# Patient Record
Sex: Male | Born: 1950 | Smoking: Former smoker
Health system: Southern US, Community
[De-identification: ages and names within clinical notes are randomized; demographics above are authoritative.]

## PROBLEM LIST (undated history)

## (undated) DIAGNOSIS — J309 Allergic rhinitis, unspecified: Secondary | ICD-10-CM

## (undated) DIAGNOSIS — R1310 Dysphagia, unspecified: Secondary | ICD-10-CM

## (undated) DIAGNOSIS — E785 Hyperlipidemia, unspecified: Secondary | ICD-10-CM

## (undated) DIAGNOSIS — K219 Gastro-esophageal reflux disease without esophagitis: Secondary | ICD-10-CM

## (undated) DIAGNOSIS — F209 Schizophrenia, unspecified: Secondary | ICD-10-CM

## (undated) DIAGNOSIS — M4850XA Collapsed vertebra, not elsewhere classified, site unspecified, initial encounter for fracture: Secondary | ICD-10-CM

## (undated) DIAGNOSIS — F329 Major depressive disorder, single episode, unspecified: Secondary | ICD-10-CM

## (undated) DIAGNOSIS — F0390 Unspecified dementia without behavioral disturbance: Secondary | ICD-10-CM

## (undated) HISTORY — DX: Collapsed vertebra, not elsewhere classified, site unspecified, initial encounter for fracture: M48.50XA

## (undated) HISTORY — DX: Allergic rhinitis, unspecified: J30.9

## (undated) HISTORY — DX: Dysphagia, unspecified: R13.10

## (undated) HISTORY — DX: Major depressive disorder, single episode, unspecified: F32.9

## (undated) HISTORY — DX: Hyperlipidemia, unspecified: E78.5

## (undated) HISTORY — DX: Schizophrenia, unspecified: F20.9

## (undated) HISTORY — PX: EXPLORATORY LAPAROTOMY: SUR591

## (undated) HISTORY — DX: Gastro-esophageal reflux disease without esophagitis: K21.9

---

## 2012-08-02 ENCOUNTER — Encounter: Payer: Self-pay | Admitting: Adult Health

## 2012-08-02 ENCOUNTER — Non-Acute Institutional Stay (SKILLED_NURSING_FACILITY): Payer: Medicare Other | Admitting: Adult Health

## 2012-08-02 DIAGNOSIS — M4850XA Collapsed vertebra, not elsewhere classified, site unspecified, initial encounter for fracture: Secondary | ICD-10-CM | POA: Insufficient documentation

## 2012-08-02 DIAGNOSIS — J309 Allergic rhinitis, unspecified: Secondary | ICD-10-CM

## 2012-08-02 DIAGNOSIS — F329 Major depressive disorder, single episode, unspecified: Secondary | ICD-10-CM | POA: Insufficient documentation

## 2012-08-02 DIAGNOSIS — F209 Schizophrenia, unspecified: Secondary | ICD-10-CM

## 2012-08-02 DIAGNOSIS — K219 Gastro-esophageal reflux disease without esophagitis: Secondary | ICD-10-CM

## 2012-08-02 DIAGNOSIS — E785 Hyperlipidemia, unspecified: Secondary | ICD-10-CM

## 2012-08-02 DIAGNOSIS — F32A Depression, unspecified: Secondary | ICD-10-CM | POA: Insufficient documentation

## 2012-08-02 DIAGNOSIS — R1310 Dysphagia, unspecified: Secondary | ICD-10-CM

## 2012-08-02 DIAGNOSIS — M8448XD Pathological fracture, other site, subsequent encounter for fracture with routine healing: Secondary | ICD-10-CM

## 2012-08-02 HISTORY — DX: Schizophrenia, unspecified: F20.9

## 2012-08-02 HISTORY — DX: Gastro-esophageal reflux disease without esophagitis: K21.9

## 2012-08-02 HISTORY — DX: Depression, unspecified: F32.A

## 2012-08-02 HISTORY — DX: Allergic rhinitis, unspecified: J30.9

## 2012-08-02 HISTORY — DX: Hyperlipidemia, unspecified: E78.5

## 2012-08-02 HISTORY — DX: Dysphagia, unspecified: R13.10

## 2012-08-02 HISTORY — DX: Collapsed vertebra, not elsewhere classified, site unspecified, initial encounter for fracture: M48.50XA

## 2012-08-03 NOTE — Assessment & Plan Note (Addendum)
He is stable is taking nascort twice daily

## 2012-08-03 NOTE — Progress Notes (Signed)
Patient ID: Kevin Humphrey, male   DOB: Oct 24, 1950, 62 y.o.   MRN: [id number]  Chief Complaint  Patient presents with  . Medical Managment of Chronic Issues    HPI:  Allergic rhinitis He is stable is taking nascort twice daily   GERD (gastroesophageal reflux disease) Is stable is taking prilosec 20 mg daily   Vertebral compression fracture He is presently stable is taking percocet 5/325 mg 1or 2 tabs every 6 hours as needed for pain  Depression Is emotionally stable is taking zoloft 200 mg daily   Other and unspecified hyperlipidemia He was taken off his medications due to weight loss which as stabilized since then he is presetnly stable.   Schizophrenia He is presently stable is taking risperdal 2 mg in the and and 4 mg in the pm is taking cogentin 2 mg twice daily and takes klonopin 0.5 mg three times daily    No past medical history on file.  No past surgical history on file.  VITAL SIGNS BP 112/76  Pulse 76  Ht 5\' 11"  (1.803 m)  Wt 163 lb (73.936 kg)  BMI 22.74 kg/m2   Patient's Medications  New Prescriptions   No medications on file  Previous Medications   BENZTROPINE (COGENTIN) 2 MG TABLET    Take 2 mg by mouth 2 (two) times daily.   CLONAZEPAM (KLONOPIN) 0.5 MG TABLET    Take 0.5 mg by mouth 3 (three) times daily.   MULTIPLE VITAMIN (MULTIVITAMIN) TABLET    Take 1 tablet by mouth daily.   OMEPRAZOLE (PRILOSEC) 20 MG CAPSULE    Take 20 mg by mouth daily.   OXYCODONE-ACETAMINOPHEN (PERCOCET/ROXICET) 5-325 MG PER TABLET    Take 1 tablet by mouth every 6 (six) hours as needed for pain.   RISPERIDONE (RISPERDAL) 2 MG TABLET    Take 2 mg by mouth 2 (two) times daily. Takes 2 mg in the am and 4 mg in the pm   SERTRALINE (ZOLOFT) 100 MG TABLET    Take 100 mg by mouth daily. Takes 200 mg daily   TRAZODONE (DESYREL) 150 MG TABLET    Take 150 mg by mouth at bedtime.   TRIAMCINOLONE (NASACORT) 55 MCG/ACT NASAL INHALER    Place 2 sprays into the nose 2 (two) times daily.   Modified Medications   No medications on file  Discontinued Medications   No medications on file    SIGNIFICANT DIAGNOSTIC EXAMS  04/01/2012: CT of neck: No acute cervical spinal fracture; positive for cervical spondylosis; large bridging osteophytes 04/01/2012: CT of chest: Positive for acute rib fracture;severe thoracic spondylosis; positive bilateral pleural effusion 04/01/2012: CT of head: No significant changes from exam of 01/10/2012. Left maxillary sinus without change.     LAB REVIEWED  04-15-12: wbc 7.5; hgb 11.3; hct 34.0; mcv 88.5;plt 427; glucose 74; bun 21; creat 0.93; k+ 4.4 ;na++ 140;  alk phos 44; albumin 3.7; chol 112; ldl 62; trig 83; ammonia 119; hgb a1c 5.4; depakote 36.5    Review of Systems  Unable to perform ROS  Physical Exam  Constitutional:  Is thin  Neck: Neck supple.  Cardiovascular: Normal rate, regular rhythm and intact distal pulses.   Respiratory: Effort normal and breath sounds normal.  GI: Soft. Bowel sounds are normal.  Musculoskeletal: Normal range of motion. He exhibits no edema.  Neurological: He is alert.  Is oriented to self   Skin: Skin is warm and dry.  Psychiatric: He has a normal mood and affect.  ASSESSMENT/ PLAN:   He is presently stable will continue his current regimen and will not make changes will make changes in the future as indicated.

## 2012-08-03 NOTE — Assessment & Plan Note (Signed)
He is presently stable is taking risperdal 2 mg in the and and 4 mg in the pm is taking cogentin 2 mg twice daily and takes klonopin 0.5 mg three times daily

## 2012-08-03 NOTE — Assessment & Plan Note (Signed)
Is emotionally stable is taking zoloft 200 mg daily

## 2012-08-03 NOTE — Assessment & Plan Note (Signed)
He was taken off his medications due to weight loss which as stabilized since then he is presetnly stable.

## 2012-08-03 NOTE — Assessment & Plan Note (Signed)
Is stable is taking prilosec 20 mg daily

## 2012-08-03 NOTE — Assessment & Plan Note (Signed)
He is presently stable is taking percocet 5/325 mg 1or 2 tabs every 6 hours as needed for pain 

## 2012-09-27 ENCOUNTER — Non-Acute Institutional Stay (SKILLED_NURSING_FACILITY): Payer: Medicare Other | Admitting: Adult Health

## 2012-09-27 DIAGNOSIS — F209 Schizophrenia, unspecified: Secondary | ICD-10-CM

## 2012-09-27 DIAGNOSIS — J309 Allergic rhinitis, unspecified: Secondary | ICD-10-CM

## 2012-09-27 DIAGNOSIS — F3289 Other specified depressive episodes: Secondary | ICD-10-CM

## 2012-09-27 DIAGNOSIS — R1310 Dysphagia, unspecified: Secondary | ICD-10-CM

## 2012-09-27 DIAGNOSIS — M8448XD Pathological fracture, other site, subsequent encounter for fracture with routine healing: Secondary | ICD-10-CM

## 2012-09-27 DIAGNOSIS — F329 Major depressive disorder, single episode, unspecified: Secondary | ICD-10-CM

## 2012-09-27 DIAGNOSIS — K219 Gastro-esophageal reflux disease without esophagitis: Secondary | ICD-10-CM

## 2012-12-05 ENCOUNTER — Other Ambulatory Visit: Payer: Self-pay | Admitting: Geriatric Medicine

## 2012-12-05 MED ORDER — CLONAZEPAM 0.5 MG PO TABS: 0.5000 mg | ORAL_TABLET | Freq: Three times a day (TID) | ORAL | Status: DC

## 2013-01-17 ENCOUNTER — Non-Acute Institutional Stay (SKILLED_NURSING_FACILITY): Payer: Medicare Other | Admitting: Adult Health

## 2013-01-17 DIAGNOSIS — F3289 Other specified depressive episodes: Secondary | ICD-10-CM

## 2013-01-17 DIAGNOSIS — F209 Schizophrenia, unspecified: Secondary | ICD-10-CM

## 2013-01-17 DIAGNOSIS — K219 Gastro-esophageal reflux disease without esophagitis: Secondary | ICD-10-CM

## 2013-01-17 DIAGNOSIS — M8448XD Pathological fracture, other site, subsequent encounter for fracture with routine healing: Secondary | ICD-10-CM

## 2013-01-17 DIAGNOSIS — R1310 Dysphagia, unspecified: Secondary | ICD-10-CM

## 2013-01-17 DIAGNOSIS — J309 Allergic rhinitis, unspecified: Secondary | ICD-10-CM

## 2013-01-17 DIAGNOSIS — F329 Major depressive disorder, single episode, unspecified: Secondary | ICD-10-CM

## 2013-01-28 ENCOUNTER — Non-Acute Institutional Stay (SKILLED_NURSING_FACILITY): Payer: Medicare Other | Admitting: Internal Medicine

## 2013-01-28 DIAGNOSIS — K219 Gastro-esophageal reflux disease without esophagitis: Secondary | ICD-10-CM

## 2013-01-28 DIAGNOSIS — F209 Schizophrenia, unspecified: Secondary | ICD-10-CM

## 2013-01-28 NOTE — Progress Notes (Signed)
Patient ID: Kevin Humphrey, male   DOB: 12-Jun-1950, 62 y.o.   MRN: [id number] Facility; [facility] Chief complaint review of general medical issues, functional status. History; this is a gentleman who is been in the facility since late 2013. He has a long history of long-standing schizophrenia and seizure disorder. At that point he was admitted to a hospital with encephalopathy of uncertain etiology anemia and thoracic compression fractures. He had been extensively worked up for liver disease due to the finding of an elevated ammonia level and nonspecifically was found. I been asked to look at him for consideration of transfer to an assisted living level of care apparently at the request of [facility] workers who for review him periodically.   Past medical history #1 recurrent falls #2 history of pleural effusions #3 right rib fractures #4 compression fracture of T10 #5 hyponatremia #6 abnormal ammonia level without a history of liver disease and normal liver function tests #7 history of gastroesophageal reflux disease #8 history of depression and also schizophrenia #9 hyperlipidemia 10 allergic rhinitis 11)iron def anemia    Medication list is reviewed; he is on a Metrazol 20 mg daily, Risperdal 2 mg daily and 4 mg at at bedtime, Zoloft 200 mg in the morning, Nasacort 2 sprays in each nostril twice a day, Cogentin 1 mg twice daily, Klonopin 1 mg 3 times daily, trazodone 150 mg at night and    Social; the patient [place] this facility [place] I'm [place] exactly sure which one. He has never returned to his previous living location which I gather was in assisted living. In reviewing his functional status with the staff he is frequently doubly incontinent. He is total assistance with bathing dressing and hygiene. He is a one-person assist with transfers from bed to wheelchair but is mostly in the wheelchair during the day. He can propel himself in a wheelchair and can  feed himself with setup help  Physical examination Respiratory clear entry bilaterally Cardiac heart sounds are normal no murmurs no signs of congestive heart failure AB; morbid obesity but no masses or tenderness. GU no convincing bladder distention  Impression/plan #1 nonambulatory state; I am not completely certain of the exact etiology however the patient is not capable of doing much more than a pivot transfer. #2 severe psychiatric issues which seem mostly stable at the moment likely as schizophrenia or schizoaffective disorder #3 history of gastroesophageal reflux disease. He probably also has and hiatal hernia as I hear gurgling up into his chest   The exact history here he may be lost to antiquity. I will ask for my initial notes on this man to be returned to the chart as I no longer have access to my prior computer system. Nevertheless it is not easy for me to look at this man is in assisted living candidate. He requires maximal ADL assistance, mostly double incontinence. That is not to say that there isn't assisted living as they can manage this, however I think he is appropriately placed at this moment and it is difficult for me to understand how anybody could think otherwise

## 2013-02-11 ENCOUNTER — Encounter: Payer: Self-pay | Admitting: Adult Health

## 2013-02-11 NOTE — Progress Notes (Signed)
Patient ID: Kevin Humphrey, male   DOB: Feb 25, 1951, 62 y.o.   MRN: [id number]  [place]  No Known Allergies   Chief Complaint  Patient presents with  . Medical Managment of Chronic Issues    HPI: He is being seen for the medical management of his chronic illnesses. There are no concerns being voiced by the nursing staff at this time. He is unable to fully participate in the hpi or ros. Overall his status remains without significant change.    Past Medical History  Diagnosis Date  . GERD (gastroesophageal reflux disease) 08/02/2012  . Dysphagia, unspecified 08/02/2012  . Allergic rhinitis 08/02/2012  . Vertebral compression fracture 08/02/2012  . Other and unspecified hyperlipidemia 08/02/2012  . Schizophrenia 08/02/2012  . Depression 08/02/2012    No past surgical history on file.  VITAL SIGNS BP 130/84  Pulse 64  Ht 5\' 11"  (1.803 m)  Wt 184 lb (83.462 kg)  BMI 25.67 kg/m2   Patient's Medications  New Prescriptions   No medications on file  Previous Medications   BENZTROPINE (COGENTIN) 2 MG TABLET    Take 2 mg by mouth 2 (two) times daily.   CLONAZEPAM (KLONOPIN) 0.5 MG TABLET    Take 1 tablet (0.5 mg total) by mouth 3 (three) times daily.   CLONAZEPAM (KLONOPIN) 0.5 MG TABLET    Take 1 tablet (0.5 mg total) by mouth 3 (three) times daily.   MULTIPLE VITAMIN (MULTIVITAMIN) TABLET    Take 1 tablet by mouth daily.   OMEPRAZOLE (PRILOSEC) 20 MG CAPSULE    Take 20 mg by mouth daily.   OXYCODONE-ACETAMINOPHEN (PERCOCET/ROXICET) 5-325 MG PER TABLET    Take 1 tablet by mouth every 6 (six) hours as needed for pain.   RISPERIDONE (RISPERDAL) 2 MG TABLET    Take 2 mg by mouth 2 (two) times daily. Takes 2 mg in the am and 4 mg in the pm   SERTRALINE (ZOLOFT) 100 MG TABLET    Take 100 mg by mouth daily. Takes 200 mg daily   TRAZODONE (DESYREL) 150 MG TABLET    Take 150 mg by mouth at bedtime.   TRIAMCINOLONE (NASACORT) 55 MCG/ACT NASAL INHALER    Place 2 sprays into the nose 2 (two)  times daily.  Modified Medications   No medications on file  Discontinued Medications   No medications on file    SIGNIFICANT DIAGNOSTIC EXAMS    04/01/2012: CT of neck: No acute cervical spinal fracture; positive for cervical spondylosis; large bridging osteophytes 04/01/2012: CT of chest: Positive for acute rib fracture;severe thoracic spondylosis; positive bilateral pleural effusion 04/01/2012: CT of head: No significant changes from exam of 01/10/2012. Left maxillary sinus without change.     LAB REVIEWED  04-15-12: wbc 7.5; hgb 11.3; hct 34.0; mcv 88.5;plt 427; glucose 74; bun 21; creat 0.93; k+ 4.4 ;na++ 140; alk phos 44; albumin 3.7; chol 112; ldl 62; trig 83; ammonia 119; hgb a1c 5.4; depakote 36.5  08-05-12: ammonia 92 08-30-12: ammonia 47   Review of Systems  Unable to perform ROS  Physical Exam  Constitutional:  Is thin  Neck: Neck supple.  Cardiovascular: Normal rate, regular rhythm and intact distal pulses.   Respiratory: Effort normal and breath sounds normal.  GI: Soft. Bowel sounds are normal.  Musculoskeletal: Normal range of motion. He exhibits no edema.  Neurological: He is alert.  Is oriented to self   Skin: Skin is warm and dry.  Psychiatric: He has a normal mood and affect.  ASSESSMENT/ PLAN:   1. Dysphagia: no signs of aspiration present; will continue his current plan of care and will continue to monitor his status  2. Genella Rife; will continue prilosec 20 mg daily  3. Allergic rhinitis: will continue nasocort nasal spray daily and monitor   4. Vertebral compression fracture: is stable will continue his percocet 5/325 mg every 4 hours as needed for pain and will monitor his status  5. Depression: he is emotionally stable will continue his zoloft 200 mg daily and klonopin 0.5 mg three times daily for anxiety and trazodone 150 mg nightly for sleep and depression and will continue to monitor his emotional status.   6. Schizophrenia: is without change  in status; there are no reports of significant behavorial issues present; will continue his risperdal 2 mg int he am and 4 mg in the pm and will continue cogentin 2 mg twice daily for eps symptoms and will monitor his status.    Will check cbc; cmp; hgb a1c next draw.

## 2013-02-17 ENCOUNTER — Encounter: Payer: Self-pay | Admitting: Adult Health

## 2013-02-17 NOTE — Progress Notes (Signed)
Patient ID: Kevin Humphrey, male   DOB: 12-31-1950, 62 y.o.   MRN: [id number]  [place]  No Known Allergies   Chief Complaint  Patient presents with  . Medical Managment of Chronic Issues    HPI:  He is being seen for the management of his chronic illnesses. There are no concerns being voiced by the nursing staff at this time; overall his status remains unchanged. He is unable to participate in the hpi or ros.   Past Medical History  Diagnosis Date  . GERD (gastroesophageal reflux disease) 08/02/2012  . Dysphagia, unspecified 08/02/2012  . Allergic rhinitis 08/02/2012  . Vertebral compression fracture 08/02/2012  . Other and unspecified hyperlipidemia 08/02/2012  . Schizophrenia 08/02/2012  . Depression 08/02/2012    No past surgical history on file.  VITAL SIGNS BP 120/70  Pulse 78  Ht 5\' 11"  (1.803 m)  Wt 206 lb (93.441 kg)  BMI 28.74 kg/m2   Patient's Medications  New Prescriptions   No medications on file  Previous Medications   BENZTROPINE (COGENTIN) 2 MG TABLET    Take 1 mg by mouth 2 (two) times daily.    CLONAZEPAM (KLONOPIN) 0.5 MG TABLET    Take 1 tablet (0.5 mg total) by mouth 3 (three) times daily.   MULTIPLE VITAMIN (MULTIVITAMIN) TABLET    Take 1 tablet by mouth daily.   OMEPRAZOLE (PRILOSEC) 20 MG CAPSULE    Take 20 mg by mouth daily.   OXYCODONE-ACETAMINOPHEN (PERCOCET/ROXICET) 5-325 MG PER TABLET    Take 1 tablet by mouth every 6 (six) hours as needed for pain.   RISPERIDONE (RISPERDAL) 2 MG TABLET    Take 4 mg by mouth 2 (two) times daily. Takes 2 mg in the am and 4 mg in the pm   SERTRALINE (ZOLOFT) 100 MG TABLET    Take 100 mg by mouth daily. Takes 200 mg daily   TRAZODONE (DESYREL) 150 MG TABLET    Take 150 mg by mouth at bedtime.   TRIAMCINOLONE (NASACORT) 55 MCG/ACT NASAL INHALER    Place 2 sprays into the nose 2 (two) times daily.  Modified Medications   No medications on file  Discontinued Medications   CLONAZEPAM (KLONOPIN) 0.5 MG TABLET    Take  1 tablet (0.5 mg total) by mouth 3 (three) times daily.    SIGNIFICANT DIAGNOSTIC EXAMS  04/01/2012: CT of neck: No acute cervical spinal fracture; positive for cervical spondylosis; large bridging osteophytes 04/01/2012: CT of chest: Positive for acute rib fracture;severe thoracic spondylosis; positive bilateral pleural effusion 04/01/2012: CT of head: No significant changes from exam of 01/10/2012. Left maxillary sinus without change.     LAB REVIEWED  04-15-12: wbc 7.5; hgb 11.3; hct 34.0; mcv 88.5;plt 427; glucose 74; bun 21; creat 0.93; k+ 4.4 ;na++ 140; alk phos 44; albumin 3.7; chol 112; ldl 62; trig 83; ammonia 119; hgb a1c 5.4; depakote 36.5  08-05-12: ammonia 92 08-30-12: ammonia 47 12-19-12: wbc 6.5; hgb 11.9; hct 34.7; mcv 83.6; plt 272; glucose 83; bun 15; creat 0.84; k+6.3; na++136; liver normal albumin 3.7 12-20-12: glucose 79; bun 18; creat 0.93; k+4.7; na++ 138 01-03-13; urine culture: enterococcus: levaquin    Review of Systems  Unable to perform ROS  Physical Exam  Constitutional:  Is thin  Neck: Neck supple.  Cardiovascular: Normal rate, regular rhythm and intact distal pulses.   Respiratory: Effort normal and breath sounds normal.  GI: Soft. Bowel sounds are normal.  Musculoskeletal: Normal range of motion. He exhibits no edema.  he is no longer wearing a back brace  Neurological: He is alert.  Is oriented to self   Skin: Skin is warm and dry.  Psychiatric: He has a normal mood and affect.    ASSESSMENT/ PLAN:   1. Dysphagia: no signs of aspiration present; will continue his current plan of care and will continue to monitor his status  2. Genella Rife; will continue prilosec 20 mg daily  3. Allergic rhinitis: will continue nasocort nasal spray daily and monitor   4. Vertebral compression fracture: is stable will continue his percocet 5/325 mg every 6 hours as needed for pain and will monitor his status  5. Depression: he is emotionally stable will continue his  zoloft 200 mg daily and klonopin 0.5 mg three times daily for anxiety and trazodone 150 mg nightly for sleep and depression and will continue to monitor his emotional status.   6. Schizophrenia: is without change in status; there are no reports of significant behavorial issues present; will continue his risperdal 4 mg twice daily  and will continue cogentin 1 mg twice daily for eps symptoms and will monitor his status.    Will check hgb a1c next draw

## 2013-02-28 ENCOUNTER — Non-Acute Institutional Stay (SKILLED_NURSING_FACILITY): Payer: Medicare Other | Admitting: Adult Health

## 2013-02-28 DIAGNOSIS — F329 Major depressive disorder, single episode, unspecified: Secondary | ICD-10-CM

## 2013-02-28 DIAGNOSIS — J309 Allergic rhinitis, unspecified: Secondary | ICD-10-CM

## 2013-02-28 DIAGNOSIS — F209 Schizophrenia, unspecified: Secondary | ICD-10-CM

## 2013-02-28 DIAGNOSIS — R1310 Dysphagia, unspecified: Secondary | ICD-10-CM

## 2013-02-28 DIAGNOSIS — K219 Gastro-esophageal reflux disease without esophagitis: Secondary | ICD-10-CM

## 2013-02-28 DIAGNOSIS — M8448XD Pathological fracture, other site, subsequent encounter for fracture with routine healing: Secondary | ICD-10-CM

## 2013-02-28 DIAGNOSIS — E785 Hyperlipidemia, unspecified: Secondary | ICD-10-CM

## 2013-03-03 ENCOUNTER — Encounter: Payer: Self-pay | Admitting: Adult Health

## 2013-03-03 MED ORDER — ATORVASTATIN CALCIUM 20 MG PO TABS: 20.0000 mg | ORAL_TABLET | Freq: Every day | ORAL | Status: DC

## 2013-03-03 NOTE — Progress Notes (Signed)
Patient ID: Kevin Humphrey, male   DOB: 1950-08-03, 62 y.o.   MRN: [id number]  [place]  No Known Allergies   Chief Complaint  Patient presents with  . Medical Managment of Chronic Issues    HPI: He is being seen for the management of his chronic illnesses. Overall his status remains without change over there recent past. There are no concerns being voiced by the nursing staff. He is unable to participate fully in the hpi or ros. There are no reports of behavioral disturbance by the nursing staff.   Past Medical History  Diagnosis Date  . GERD (gastroesophageal reflux disease) 08/02/2012  . Dysphagia, unspecified 08/02/2012  . Allergic rhinitis 08/02/2012  . Vertebral compression fracture 08/02/2012  . Other and unspecified hyperlipidemia 08/02/2012  . Schizophrenia 08/02/2012  . Depression 08/02/2012    No past surgical history on file.  VITAL SIGNS BP 120/70  Pulse 82  Ht 5\' 11"  (1.803 m)  Wt 211 lb (95.709 kg)  BMI 29.44 kg/m2   Patient's Medications  New Prescriptions   No medications on file  Previous Medications   BENZTROPINE (COGENTIN) 2 MG TABLET    Take 1 mg by mouth 2 (two) times daily.    CLONAZEPAM (KLONOPIN) 0.5 MG TABLET    Take 1 tablet (0.5 mg total) by mouth 3 (three) times daily.   DIVALPROEX (DEPAKOTE SPRINKLE) 125 MG CAPSULE    Take 125 mg by mouth 2 (two) times daily.   MULTIPLE VITAMIN (MULTIVITAMIN) TABLET    Take 1 tablet by mouth daily.   OMEPRAZOLE (PRILOSEC) 20 MG CAPSULE    Take 20 mg by mouth daily.   OXYCODONE-ACETAMINOPHEN (PERCOCET/ROXICET) 5-325 MG PER TABLET    Take 1 tablet by mouth every 6 (six) hours as needed for pain.   RISPERIDONE (RISPERDAL) 2 MG TABLET    Take 4 mg by mouth 2 (two) times daily. Takes 2 mg in the am and 4 mg in the pm   SERTRALINE (ZOLOFT) 100 MG TABLET    Take 100 mg by mouth daily. Takes 200 mg daily   TRAZODONE (DESYREL) 150 MG TABLET    Take 150 mg by mouth at bedtime.   TRIAMCINOLONE (NASACORT) 55 MCG/ACT NASAL  INHALER    Place 2 sprays into the nose 2 (two) times daily.  Modified Medications   No medications on file  Discontinued Medications   No medications on file    SIGNIFICANT DIAGNOSTIC EXAMS   04/01/2012: CT of neck: No acute cervical spinal fracture; positive for cervical spondylosis; large bridging osteophytes 04/01/2012: CT of chest: Positive for acute rib fracture;severe thoracic spondylosis; positive bilateral pleural effusion 04/01/2012: CT of head: No significant changes from exam of 01/10/2012. Left maxillary sinus without change.     LAB REVIEWED  04-15-12: wbc 7.5; hgb 11.3; hct 34.0; mcv 88.5;plt 427; glucose 74; bun 21; creat 0.93; k+ 4.4 ;na++ 140; alk phos 44; albumin 3.7; chol 112; ldl 62; trig 83; ammonia 119; hgb a1c 5.4; depakote 36.5  08-05-12: ammonia 92 08-30-12: ammonia 47 12-19-12: wbc 6.5; hgb 11.9; hct 34.7; mcv 83.6; plt 272; glucose 83; bun 15; creat 0.84; k+6.3; na++136; liver normal albumin 3.7 12-20-12: glucose 79; bun 18; creat 0.93; k+4.7; na++ 138 01-03-13; urine culture: enterococcus: levaquin 01-20-13: hgb a1c 5.4 01-29-13: wbc 6.9; hgb 13.2; hct 38.9 ;mcv 83.3. ;plt 260; chol 255; ldl 155; trig 246; tsh 1.832;    hgb a1c 5.3     Review of Systems  Unable to perform ROS  Physical Exam  Constitutional:  Is thin  Neck: Neck supple.  Cardiovascular: Normal rate, regular rhythm and intact distal pulses.   Respiratory: Effort normal and breath sounds normal.  GI: Soft. Bowel sounds are normal.  Musculoskeletal: Normal range of motion. He exhibits no edema.  he is no longer wearing a back brace  Neurological: He is alert.  Is oriented to self   Skin: Skin is warm and dry.  Psychiatric: He has a normal mood and affect.    ASSESSMENT/ PLAN:   1. Dysphagia: no signs of aspiration present; will continue his current plan of care and will continue to monitor his status  2. Genella Rife; will continue prilosec 20 mg daily  3. Allergic rhinitis: will continue  nasocort nasal spray daily and monitor   4. Vertebral compression fracture: is stable will continue his percocet 5/325 mg every 6 hours as needed for pain and will monitor his status; he is not voicing any pain.   5. Depression: he is emotionally stable will continue his zoloft 200 mg daily and klonopin 0.5 mg three times daily for anxiety and trazodone 150 mg nightly for sleep and depression and will continue to monitor his emotional status.   6. Schizophrenia: is without change in status; there are no reports of significant behavorial issues present; will continue his risperdal 4 mg twice daily  and will continue cogentin 1 mg twice daily for eps symptoms he is followed by psych services; is on depakote sprinkles 125 mg twice daily to help stabilize mood. Will continue to monitor his status.   7. Dyslipidemia: due to his elevated ldl and trig; will being him lipitor 20 mg daily and will check lipids and liver function in 2 months; will monitor

## 2013-04-21 ENCOUNTER — Non-Acute Institutional Stay (SKILLED_NURSING_FACILITY): Payer: Medicare Other | Admitting: Nurse Practitioner

## 2013-04-21 DIAGNOSIS — R1310 Dysphagia, unspecified: Secondary | ICD-10-CM

## 2013-04-21 DIAGNOSIS — E785 Hyperlipidemia, unspecified: Secondary | ICD-10-CM

## 2013-04-21 DIAGNOSIS — F329 Major depressive disorder, single episode, unspecified: Secondary | ICD-10-CM

## 2013-04-21 DIAGNOSIS — K219 Gastro-esophageal reflux disease without esophagitis: Secondary | ICD-10-CM

## 2013-04-21 DIAGNOSIS — M8448XD Pathological fracture, other site, subsequent encounter for fracture with routine healing: Secondary | ICD-10-CM

## 2013-04-21 DIAGNOSIS — F209 Schizophrenia, unspecified: Secondary | ICD-10-CM

## 2013-04-21 NOTE — Progress Notes (Signed)
Patient ID: Kevin Humphrey, male   DOB: 08-24-1950, 62 y.o.   MRN: [id number]    Nursing Home Location:  [facility]   Place of Service: SNF (31)  PCP: No primary provider on file.  No Known Allergies  Chief Complaint  Patient presents with  . Medical Managment of Chronic Issues    HPI:  61 year old male is being seen today for the management of his chronic illnesses. Pt with a pmh of GERD, dysphagia, vertebral compression fractures, hyperlipidemia, depression and schizophrenia; Overall his status remains without change over there recent past. There are no concerns being voiced by the nursing staff. Pt is unable to participate fully in the hpi or ros. There are no reports of behavioral disturbance or changes in behavior by the nursing staff.   Review of Systems:  Unable to obtain   Past Medical History  Diagnosis Date  . GERD (gastroesophageal reflux disease) 08/02/2012  . Dysphagia, unspecified 08/02/2012  . Allergic rhinitis 08/02/2012  . Vertebral compression fracture 08/02/2012  . Other and unspecified hyperlipidemia 08/02/2012  . Schizophrenia 08/02/2012  . Depression 08/02/2012   No past surgical history on file. Social History:   reports that he has quit smoking. He does not have any smokeless tobacco history on file. His alcohol and drug histories are not on file.  No family history on file.  Medications: Patient's Medications  New Prescriptions   No medications on file  Previous Medications   ATORVASTATIN (LIPITOR) 20 MG TABLET    Take 1 tablet (20 mg total) by mouth daily.   BENZTROPINE (COGENTIN) 2 MG TABLET    Take 1 mg by mouth 2 (two) times daily.    CLONAZEPAM (KLONOPIN) 0.5 MG TABLET    Take 1 tablet (0.5 mg total) by mouth 3 (three) times daily.   DIVALPROEX (DEPAKOTE SPRINKLE) 125 MG CAPSULE    Take 125 mg by mouth 2 (two) times daily.   FAMOTIDINE (PEPCID) 20 MG TABLET    Take 20 mg by mouth 2 (two) times daily.   MULTIPLE VITAMIN  (MULTIVITAMIN) TABLET    Take 1 tablet by mouth daily.   OMEPRAZOLE (PRILOSEC) 20 MG CAPSULE    Take 20 mg by mouth daily.   OXYCODONE-ACETAMINOPHEN (PERCOCET/ROXICET) 5-325 MG PER TABLET    Take 1 tablet by mouth every 6 (six) hours as needed for pain.   RISPERIDONE (RISPERDAL) 2 MG TABLET    Take 4 mg by mouth 2 (two) times daily. Takes 2 mg in the am and 4 mg in the pm   SERTRALINE (ZOLOFT) 100 MG TABLET    Take 100 mg by mouth daily. Takes 200 mg daily   TRAZODONE (DESYREL) 150 MG TABLET    Take 150 mg by mouth at bedtime.   TRIAMCINOLONE (NASACORT) 55 MCG/ACT NASAL INHALER    Place 2 sprays into the nose 2 (two) times daily.  Modified Medications   No medications on file  Discontinued Medications   No medications on file     Physical Exam: Physical Exam  Constitutional: He is well-developed, well-nourished, and in no distress.  HENT:  Mouth/Throat: Oropharynx is clear and moist. No oropharyngeal exudate.  Eyes: Conjunctivae and EOM are normal. Pupils are equal, round, and reactive to light.  Neck: Normal range of motion. Neck supple. No thyromegaly present.  Cardiovascular: Normal rate, regular rhythm and normal heart sounds.   Pulmonary/Chest: Effort normal and breath sounds normal. No respiratory distress.  Abdominal: Soft. Bowel sounds are normal.  He exhibits no distension.  Musculoskeletal: He exhibits no edema and no tenderness.  Lymphadenopathy:    He has no cervical adenopathy.  Neurological: He is alert.  Skin: Skin is warm and dry. He is not diaphoretic.    Filed Vitals:   04/21/13 1220  BP: 135/75  Pulse: 73  Temp: 96.3 F (35.7 C)  Resp: 16      Labs reviewed: 04-15-12: wbc 7.5; hgb 11.3; hct 34.0; mcv 88.5;plt 427; glucose 74; bun 21; creat 0.93; k+ 4.4 ;na++ 140; alk phos 44; albumin 3.7; chol 112; ldl 62; trig 83; ammonia 119; hgb a1c 5.4; depakote 36.5  08-05-12: ammonia 92  08-30-12: ammonia 47  12-19-12: wbc 6.5; hgb 11.9; hct 34.7; mcv 83.6; plt 272;  glucose 83; bun 15; creat 0.84; k+6.3; na++136; liver normal albumin 3.7  12-20-12: glucose 79; bun 18; creat 0.93; k+4.7; na++ 138  01-03-13; urine culture: enterococcus: levaquin  01-20-13: hgb a1c 5.4  01-29-13: wbc 6.9; hgb 13.2; hct 38.9 ;mcv 83.3. ;plt 260; chol 255; ldl 155; trig 246; tsh 1.832;  hgb a1c 5.3    Assessment/Plan 1. Depression -Patients depression  is stable; continue current regimen. Will monitor and make changes as necessary.  2. Schizophrenia -being followed by psych services; mood appears to be stable at this time, will cont current medications  3. Other and unspecified hyperlipidemia -was started on Lipitor last month; appears to be tolerating well without side effects -follow up lipid panel ordered for next month   4. GERD (gastroesophageal reflux disease) -stable on current medications   5. Vertebral compression fracture, with routine healing, subsequent encounter -reports back pain is better; conts with as needed percocet   6. Dysphagia, unspecified -stable at this time; no signs of aspiration

## 2013-06-09 ENCOUNTER — Other Ambulatory Visit: Payer: Self-pay | Admitting: *Deleted

## 2013-06-09 MED ORDER — CLONAZEPAM 0.5 MG PO TABS: ORAL_TABLET | ORAL | Status: DC

## 2013-06-09 NOTE — Telephone Encounter (Signed)
[facility] 

## 2013-06-17 ENCOUNTER — Non-Acute Institutional Stay (SKILLED_NURSING_FACILITY): Payer: PRIVATE HEALTH INSURANCE | Admitting: Internal Medicine

## 2013-06-17 DIAGNOSIS — K219 Gastro-esophageal reflux disease without esophagitis: Secondary | ICD-10-CM

## 2013-06-17 DIAGNOSIS — F209 Schizophrenia, unspecified: Secondary | ICD-10-CM

## 2013-06-17 DIAGNOSIS — E785 Hyperlipidemia, unspecified: Secondary | ICD-10-CM

## 2013-06-17 NOTE — Progress Notes (Signed)
Patient ID: Kevin Humphrey, male   DOB: February 27, 1951, 63 y.o.   MRN: [id number]  Facility; [facility] Chief complaint review of general medical issues, transferred to [facility] in January 2015 History; this is a gentleman who is been in the facility since late 2013. He has a long history of long-standing schizophrenia and seizure disorder. At that point he was admitted to a hospital with encephalopathy of uncertain etiology anemia and thoracic compression fractures. He had been extensively worked up for liver disease due to the finding of an elevated ammonia level and nonspecifically was found. There was a some point an issue about him returning to assisted living although I really could not proceed this and it appears that he has now become a long-standing resident of this facility.  Major problem appears to be behavioral problems related to his psychiatric diagnoses he appears to have her blood agitation when he is around women  Lab work from 1/19 shows a white count of 6.2, hemoglobin of 12.4, platelet count 263. Valproic acid level is 22. Comprehensive metabolic panel was completely normal including an albumin of 3.6. Lipid panel shows an LDL of 96 triglyceride of 166 HDL of 46  Past medical history #1 recurrent falls #2 history of pleural effusions #3 right rib fractures #4 compression fracture of T10 #5 hyponatremia #6 abnormal ammonia level without a history of liver disease and normal liver function tests #7 history of gastroesophageal reflux disease #8 history of depression and also schizophrenia #9 hyperlipidemia 10 allergic rhinitis 11)iron def anemia    Medication list is reviewed;   Social; the patient was [place] this facility [place]'m [place] exactly sure [place] one. He has never returned to his previous living location which I gather was in assisted living. In reviewing his functional status with the staff he is frequently doubly incontinent. He is  total assistance with bathing dressing and hygiene. He is a one-person assist with transfers from bed to wheelchair but is mostly in the wheelchair during the day. He can propel himself in a wheelchair and can feed himself with setup help  Physical examination; he is generally quite cooperative with me Temperature 90.6-pulse 84-respirations 20-BP 120/60-weight 218 pound Respiratory clear entry bilaterally Cardiac heart sounds are normal no murmurs no signs of congestive heart failure AB; morbid obesity but no masses or tenderness. GU no convincing bladder distention  Impression/plan #1 nonambulatory state; I am not completely certain of the exact etiology however the patient is not capable of doing much more than a pivot transfer. #2 severe psychiatric issues which seem mostly stable at the moment likely as schizophrenia or schizoaffective disorder #3 history of gastroesophageal reflux disease. He probably also has and hiatal hernia as I hear gurgling up into his chest   The exact history here he may be lost to antiquity. Maj. issues at present appear to be psychiatric

## 2013-07-04 ENCOUNTER — Other Ambulatory Visit: Payer: Self-pay | Admitting: *Deleted

## 2013-07-04 MED ORDER — LORAZEPAM 0.5 MG PO TABS: ORAL_TABLET | ORAL | Status: DC

## 2013-07-04 NOTE — Telephone Encounter (Signed)
[facility] 

## 2013-08-20 ENCOUNTER — Non-Acute Institutional Stay (SKILLED_NURSING_FACILITY): Payer: PRIVATE HEALTH INSURANCE | Admitting: Internal Medicine

## 2013-08-20 DIAGNOSIS — R569 Unspecified convulsions: Secondary | ICD-10-CM | POA: Insufficient documentation

## 2013-08-20 DIAGNOSIS — F209 Schizophrenia, unspecified: Secondary | ICD-10-CM

## 2013-08-20 NOTE — Progress Notes (Signed)
Patient ID: Kevin Humphrey, male   DOB: July 31, 1950, 63 y.o.   MRN: [id number]   Facility; [facility] Chief complaint review of general medical issues, routine [employer] visit History; this is a gentleman who is been in the facility since late 2013. He has a long history of long-standing schizophrenia and seizure disorder. At that point he was admitted to a hospital with encephalopathy of uncertain etiology anemia and thoracic compression fractures. He had been extensively worked up for liver disease due to the finding of an elevated ammonia level that nonspecifically was found. There was a some point an issue about him returning to assisted living although I really could not proceed this and it appears that he has now become a long-standing resident of this facility.  Major problem appears to be behavioral problems related to his psychiatric diagnoses. It appear that he is on Depakote both for his schizophrenia as well as seizure disorder  Lab work from 1/19 shows a white count of 6.2, hemoglobin of 12.4, platelet count 263. Valproic acid level is 22. Comprehensive metabolic panel was completely normal including an albumin of 3.6. Lipid panel shows an LDL of 96 triglyceride of 166 HDL of 46 3/23 showed a basic metabolic panel that was normal  Past medical history #1 recurrent falls #2 history of pleural effusions #3 right rib fractures #4 compression fracture of T10 #5 hyponatremia #6 abnormal ammonia level without a history of liver disease and normal liver function tests #7 history of gastroesophageal reflux disease #8 history of depression and also schizophrenia #9 hyperlipidemia 10 allergic rhinitis 11)iron def anemia    Medication list is reviewed;   Social; the patient was admitted from this facility from a hospital [place] the county I'm not exactly sure which one. He has never returned to his previous living location which I gather was in assisted living. In reviewing his functional  status with the staff he is frequently doubly incontinent. He is total assistance with bathing dressing and hygiene. He is a one-person assist with transfers from bed to wheelchair but is mostly in the wheelchair during the day. He can propel himself in a wheelchair and can feed himself with setup help  Physical examination; he is generally quite cooperative with me Temperature 98.1-pulse 79-respirations 16-blood pressure 134/76-weight 227 pounds-O2 sat 95% on room air Respiratory clear entry bilaterally Cardiac heart sounds are normal no murmurs no signs of congestive heart failure AB; morbid obesity but no masses or tenderness. GU no convincing bladder distention  Impression/plan #1 nonambulatory state; I am not completely certain of the exact etiology however the patient is not capable of doing much more than a pivot transfer. #2 severe psychiatric issues which seem mostly stable at the moment likely  schizophrenia or schizoaffective disorder #3 history of gastroesophageal reflux disease. He probably also has and hiatal hernia as I hear gurgling up into his chest   The exact medical/functional history here he may be lost to antiquity. Maj. issues at present appear to be psychiatric

## 2013-09-16 ENCOUNTER — Non-Acute Institutional Stay (SKILLED_NURSING_FACILITY): Payer: PRIVATE HEALTH INSURANCE | Admitting: Internal Medicine

## 2013-09-16 DIAGNOSIS — F209 Schizophrenia, unspecified: Secondary | ICD-10-CM

## 2013-09-16 DIAGNOSIS — T1490XA Injury, unspecified, initial encounter: Secondary | ICD-10-CM

## 2013-09-19 NOTE — Progress Notes (Addendum)
Patient ID: Kevin Humphrey, male   DOB: June 20, 1950, 63 y.o.   MRN: [id number]                  PROGRESS NOTE  DATE:  09/16/2013    FACILITY: [facility]    LEVEL OF CARE:   SNF   Acute Visit   HISTORY OF PRESENT ILLNESS:  Kevin Humphrey is a patient who has chronic schizophrenia.  Although I had listed him as nonambulatory on my note from last month, he was standing and walking around in his room when I came to see him.    I have been asked to look at his left arm, which  apparently had some edema.    PHYSICAL EXAMINATION:   SKIN:  INSPECTION:  Left arm:  He has small open areas which, according to the wound care staff, come from scratching and picking at his skin.  When I asked him about it, he said that a dog came through his window and bit him at night.  I gather he has been referred to Psychiatry for these types of thoughts.  I note that he is on Risperdal.  With regards to the arm itself, there does not appear to be any edema or cellulitis.   LYMPHATICS:  There is no lymphadenopathy.    ASSESSMENT/PLAN:  Minor self-induced injury in the left arm.  I see no specific medical issue here.  However, he does have fairly frank, flagrant psychosis.  Last visit by Psychiatry on 08/19/2013.

## 2013-10-07 ENCOUNTER — Non-Acute Institutional Stay (SKILLED_NURSING_FACILITY): Payer: PRIVATE HEALTH INSURANCE | Admitting: Internal Medicine

## 2013-10-07 DIAGNOSIS — F203 Undifferentiated schizophrenia: Secondary | ICD-10-CM

## 2013-10-07 DIAGNOSIS — R569 Unspecified convulsions: Secondary | ICD-10-CM

## 2013-10-07 DIAGNOSIS — K219 Gastro-esophageal reflux disease without esophagitis: Secondary | ICD-10-CM

## 2013-10-07 DIAGNOSIS — F209 Schizophrenia, unspecified: Secondary | ICD-10-CM

## 2013-10-14 NOTE — Progress Notes (Addendum)
Patient ID: Kevin Humphrey, male   DOB: 08-14-1950, 63 y.o.   MRN: [id number]                 PROGRESS NOTE  DATE:  10/07/2013    FACILITY: [facility]    LEVEL OF CARE:   SNF   Routine Visit   CHIEF COMPLAINT:  Review of general medical issues/routine [employer] visit.    HISTORY OF PRESENT ILLNESS:  Kevin Humphrey is a gentleman who has been in the building since late 2013.  He has a long history of schizophrenia and seizure disorder.    At the time he was admitted here, he had come from the hospital with acute encephalopathy of uncertain etiology.  He was also noted to have anemia and thoracic compression fractures.  He had been extensively worked up for liver disease due to finding an elevated ammonia level that was nonspecific.    He has been recently noted to be hypertensive with a systolic blood pressure in the 80s.  He is now on an ACE inhibitor and low-dose hydrochlorothiazide.    He generally has chronic pain secondary to vertebral fractures.    Other than this, he has chronic schizophrenia, depression, and possibly mental retardation.  He continues on Zoloft, Risperdal, Depakote, and Klonopin.  He is followed by Psychiatry in the facility.     PAST MEDICAL HISTORY/PROBLEM LIST:  Recurrent falls.    History of pleural effusions.    Right rib fractures.    Compression fracture of T10.    Hyponatremia.    Abnormal ammonia level without a history of liver disease and normal liver function tests.  One would have to wonder in a situation like this about valproic acid.    History of gastroesophageal reflux.    History of depression and schizophrenia.    Hyperlipidemia.     Allergic rhinitis.    Iron deficiency anemia.     CURRENT MEDICATIONS:  Medication list is reviewed.     Risperdal 4 mg by mouth b.i.d.    Nasacort 2 sprays into each nostril twice daily.    Cogentin 1 mg twice daily for EPS.    Pepcid 20 mg by mouth twice daily.    Depakote Sprinkles 125, 2  capsules/250 twice daily.    Klonopin 0.5 three times a day.    Trazodone 150 mg at bedtime.    Lipitor 10 mg q.h.s.    Zoloft 200 mg daily.    SOCIAL HISTORY:   HOUSING:  He was previously in an assisted living.   FUNCTIONAL STATUS:  He is  apparently doubly incontinent frequently.  He is total assistance.  He can propel himself in a wheelchair once he is assisted with getting properly seated.    LABORATORY DATA:  Recent lab work in May showed:    Essentially normal comprehensive metabolic panel except for a sodium slightly low at 132.  His albumin was 4.1.    CBC and differential also fairly normal on 09/16/2013.    PHYSICAL EXAMINATION:   VITAL SIGNS:   TEMPERATURE:  97.8.   PULSE:  92.   RESPIRATIONS:  19.   BLOOD PRESSURE:  182/96.   WEIGHT:  224 pounds.   O2 SATURATIONS:  94% on room air.   CHEST/RESPIRATORY:  Clear air entry bilaterally.   CARDIOVASCULAR:  CARDIAC:   Heart sounds are normal.  There are no signs of congestive heart failure.    GENITOURINARY:  BLADDER:   No convincing bladder distention.  PSYCHIATRIC:   MENTAL STATUS:   He is always cooperative with me.    ASSESSMENT/PLAN:  Severe psychiatric issues, which seem mostly stable at the moment.    History of gastroesophageal reflux disease and hiatal hernia.    T10 pathologic fracture and an L1 compression fracture.    Seizure disorder.    Mental retardation?    Recent diagnosis of hypertension.  See discussion above.     Hyperlipidemia.    The patient appears to be reasonably stable.  I agree with the initiation of ACE and hydrochlorothiazide.  I note that his Depakote has recently been adjusted upwards.  Furthermore, his Risperdal was reduced to 3mg  b.i.d.

## 2013-11-16 ENCOUNTER — Non-Acute Institutional Stay (SKILLED_NURSING_FACILITY): Payer: PRIVATE HEALTH INSURANCE | Admitting: Internal Medicine

## 2013-11-16 DIAGNOSIS — F209 Schizophrenia, unspecified: Secondary | ICD-10-CM

## 2013-11-16 DIAGNOSIS — F203 Undifferentiated schizophrenia: Secondary | ICD-10-CM

## 2013-11-16 DIAGNOSIS — I10 Essential (primary) hypertension: Secondary | ICD-10-CM

## 2013-11-16 DIAGNOSIS — E785 Hyperlipidemia, unspecified: Secondary | ICD-10-CM

## 2013-11-16 NOTE — Progress Notes (Signed)
Patient ID: Kevin Humphrey, male   DOB: 07-28-1950, 63 y.o.   MRN: [id number]                  PROGRESS NOTE  DATE:  11/16/2013  FACILITY: [facility]    LEVEL OF CARE:   SNF   Routine Visit   CHIEF COMPLAINT:  Review of general medical issues/routine [employer] visit.    HISTORY OF PRESENT ILLNESS:  Kevin Humphrey is a gentleman who has been in the building since late 2013.  He has a long history of schizophrenia and seizure disorder.    At the time he was admitted here, he had come from the hospital with acute encephalopathy of uncertain etiology.  He was also noted to have anemia and thoracic compression fractures.  He had been extensively worked up for liver disease due to finding an elevated ammonia level that was nonspecific.    He has been recently noted to be hypertensive and was attempted on an ACE inhibitor however his creatinine went up to over 6 therefore he is just on hydrochlorothiazide.  He generally has chronic pain secondary to vertebral fractures.    Other than this, he has chronic schizophrenia, depression, and possibly mental retardation.  He continues on Zoloft, Risperdal, Depakote, and Klonopin.  He is followed by Psychiatry in the facility.    He pushes himself around the facility in a wheelchair. There doesn't appear to be any major issues here. His weight has been generally stable   PAST MEDICAL HISTORY/PROBLEM LIST:  Recurrent falls.    History of pleural effusions.    Right rib fractures.    Compression fracture of T10.    Hyponatremia.    Abnormal ammonia level without a history of liver disease and normal liver function tests.  One would have to wonder in a situation like this about valproic acid.    History of gastroesophageal reflux.    History of depression and schizophrenia.    Hyperlipidemia.     Allergic rhinitis.    Iron deficiency anemia.     CURRENT MEDICATIONS:  Medication list is reviewed.     Risperdal 4 mg by mouth b.i.d.     Nasacort 2 sprays into each nostril twice daily.    Cogentin 1 mg twice daily for EPS.    Pepcid 20 mg by mouth twice daily.    Depakote Sprinkles 125, 2 capsules/250 twice daily.    Klonopin 0.5 three times a day.    Trazodone 150 mg at bedtime.    Lipitor 10 mg q.h.s.    Zoloft 200 mg daily.    Hydrochlorothiazide 12.5 daily  Recent lab work on 7/17 showed an LDL cholesterol of 66, on 7/7 his sodium was 129 CO2 of 30 BUN of 10 and creatinine of 0.81  SOCIAL HISTORY:  There is no advanced directive on the chart HOUSING:  He was previously in an assisted living.   FUNCTIONAL STATUS:  He is  apparently doubly incontinent frequently.  He is total assistance.  He can propel himself in a wheelchair once he is assisted with getting properly seated.    LABORATORY DATA:  Recent lab work in May showed:    Essentially normal comprehensive metabolic panel except for a sodium slightly low at 132.  His albumin was 4.1.    CBC and differential also fairly normal on 09/16/2013.    PHYSICAL EXAMINATION:   VITAL SIGNS:   TEMPERATURE:  97.8.   PULSE:  92.   RESPIRATIONS:  19.  BLOOD PRESSURE:  182/96.   WEIGHT:  224 pounds.   O2 SATURATIONS:  94% on room air.   CHEST/RESPIRATORY:  Clear air entry bilaterally.   CARDIOVASCULAR:  CARDIAC:   Heart sounds are normal.  There are no signs of congestive heart failure.    GENITOURINARY:  BLADDER:   No convincing bladder distention.   PSYCHIATRIC:   MENTAL STATUS:   He is always cooperative with me.    ASSESSMENT/PLAN:  Severe psychiatric issues, which seem mostly stable at the moment.    History of gastroesophageal reflux disease and hiatal hernia.    T10 pathologic fracture and an L1 compression fracture.    Seizure disorder.    Mental retardation?    Recent diagnosis of hypertension.  See discussion above.     Hyperlipidemia.    The patient appears to be reasonably stable.  I agree with the initiation of ACE and  hydrochlorothiazide.  I note that his Depakote has recently been adjusted upwards.  Furthermore, his Risperdal was reduced to 3mg  b.i.d.

## 2013-11-24 ENCOUNTER — Other Ambulatory Visit: Payer: Self-pay | Admitting: *Deleted

## 2013-11-24 MED ORDER — CLONAZEPAM 0.5 MG PO TABS: ORAL_TABLET | ORAL | Status: DC

## 2013-11-24 NOTE — Telephone Encounter (Signed)
[facility] 

## 2014-02-24 ENCOUNTER — Non-Acute Institutional Stay (SKILLED_NURSING_FACILITY): Payer: PRIVATE HEALTH INSURANCE | Admitting: Internal Medicine

## 2014-02-24 DIAGNOSIS — I119 Hypertensive heart disease without heart failure: Secondary | ICD-10-CM

## 2014-02-24 DIAGNOSIS — F203 Undifferentiated schizophrenia: Secondary | ICD-10-CM

## 2014-02-26 NOTE — Progress Notes (Addendum)
Patient ID: Kevin Humphrey, male   DOB: 04-01-1951, 63 y.o.   MRN: [id number]               PROGRESS NOTE  DATE:  02/24/2014      FACILITY: [facility]    LEVEL OF CARE:   SNF   Routine Visit   CHIEF COMPLAINT:  Review of general medical issues, [employer] visit.    HISTORY OF PRESENT ILLNESS:  [name] is a gentleman who came to [place] in late 2013.  He has a long history of schizophrenia and seizure disorder.    At the time he was admitted here, he came from the hospital with acute encephalopathy of uncertain etiology.  He was noted to have anemia and thoracic compression fractures.  He has been extensively worked up for liver disease given the fact that he had an abnormal ammonia level.  However, this is not specific.    The patient has significant mental retardation.  His mental and functional status seem at baseline.  Pushes himself in the facility in a wheelchair.  He eats 50-100% of meals.    PAST MEDICAL HISTORY/PROBLEM LIST:  Includes:    Recurrent falls.    History of pleural effusions.    Right rib fractures.    Compression fracture of T10.    Hyponatremia.    Abnormal ammonia level without a history of liver disease and normal liver function tests.   He has not been on valproic acid, although he has not had any alteration of his mental status.    History of gastroesophageal reflux disease.    History of depression and schizophrenia.    Hyperlipidemia.    Allergic rhinitis.    Iron deficiency anemia.    CURRENT MEDICATIONS:  Medication list is reviewed.      Hydrochlorothiazide 12.5 q.d.    Zoloft 150 q.d.    Nasocort 2 sprays into each nostril twice a day.    Benztropine 1 mg b.i.d.    Pepcid 20 mg daily.    Depakote 375 twice a day.    Risperdal 4 mg twice a day.    Klonopin 0.5 three times a day.    Lipitor 20 q.d.    Catapres 0.1 mg tablet for systolic blood pressure greater than 180.    Trazodone 50 mg q.h.s.    Vitamin D3, 50,000 U  monthly.    PHYSICAL EXAMINATION:   VITAL SIGNS:   BLOOD PRESSURE:  180/92.   WEIGHT:  222 pounds.   CHEST/RESPIRATORY:  Clear air entry bilaterally.   CARDIOVASCULAR:  CARDIAC:   Heart sounds are normal.  There are no murmurs.   GASTROINTESTINAL:  ABDOMEN:   No masses.   LIVER/SPLEEN/KIDNEYS:  No liver, no spleen.     ASSESSMENT/PLAN:  Hypertensive heart disease without heart failure.  He came off his ACE inhibitor earlier this year due to renal insufficiency.  Off the ACE inhibitor, that resolved and his last BUN and creatinine were 7 and 0.68, respectively.  He will need to be followed for this.  Currently only on hydrochlorothiazide.    Hyperlipidemia.  His LDL is well controlled.    Severe psychiatric history.  This seems well managed at the moment.    History of gastroesophageal reflux and hiatal hernia.    History of T10 pathologic fracture with unknown component.  This was several years ago.  He does not seem to be currently symptomatic.    The patient did not tolerate ACE inhibitor due to hyperkalemia.

## 2014-04-21 ENCOUNTER — Non-Acute Institutional Stay (SKILLED_NURSING_FACILITY): Payer: PRIVATE HEALTH INSURANCE | Admitting: Internal Medicine

## 2014-04-21 DIAGNOSIS — I119 Hypertensive heart disease without heart failure: Secondary | ICD-10-CM

## 2014-04-21 DIAGNOSIS — I10 Essential (primary) hypertension: Secondary | ICD-10-CM

## 2014-04-21 DIAGNOSIS — F201 Disorganized schizophrenia: Secondary | ICD-10-CM

## 2014-04-27 NOTE — Progress Notes (Addendum)
Patient ID: Kevin Humphrey, male   DOB: May 08, 1950, 64 y.o.   MRN: [id number]              PROGRESS NOTE  DATE:  04/21/2014       FACILITY: [facility]    LEVEL OF CARE:   SNF   Routine Visit   CHIEF COMPLAINT:  Review of general medical issues/[facility] visit.    HISTORY OF PRESENT ILLNESS:  Mr. Kevin Humphrey is a gentleman who came to this building in late 2013.  At that time, he came to [place] from hospital with acute delirium of uncertain etiology.    He has a long history of schizophrenia and seizure disorder.  He remains on multiple psychiatric medications.  His baseline function seems stable.  He has had a recent reduction of his Zoloft from 200 to 150 mg a day without issue.    He seems at functional baseline, pushing himself around in the wheelchair.    He was noted to have anemia and thoracic compression fractures.    PAST MEDICAL HISTORY/PROBLEM LIST:                Recurrent falls.    History of pleural effusions.    Right rib fractures.    Compression fracture of T10.    Hypernatremia.    Abnormal ammonia level without a history of liver disease and normal liver function tests.    History of gastroesophageal reflux disease.    History of depression and schizophrenia.    Hyperlipidemia.    Allergic rhinitis.    Iron deficiency anemia.    CURRENT MEDICATIONS:  Medication list is reviewed.     Hydrochlorothiazide 12.5 q.d.    Zoloft 150 q.d.    Nasacort 2 sprays into each nostril twice a day.    Benztropine 1 mg b.i.d.    Pepcid 20 mg daily.    Depakote 375 twice a day.    Risperdal 4 mg twice a day.    Klonopin 0.5 three times a day.    Lipitor 20 q.d.    Catapres 0.1 mg for systolic blood pressure greater than 180.    Trazodone 50 q.h.s.     Vitamin D3, 50,000 U monthly.    PHYSICAL EXAMINATION:   VITAL SIGNS:   BLOOD PRESSURE:  160/90.   WEIGHT:  225 pounds.   CHEST/RESPIRATORY:  Clear air entry bilaterally.   CARDIOVASCULAR:  CARDIAC:    Heart sounds are normal.  There are no murmurs.  No gallops.   GASTROINTESTINAL:  LIVER/SPLEEN/KIDNEYS:  No liver, no spleen.  No tenderness.   PSYCHIATRIC:   MENTAL STATUS:   Appears stable, baseline.    LABORATORY DATA:  Recent lab work:    Sodium on 03/04/2014 was 139, potassium 4.2, BUN at 8, creatinine 0.69.    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  He is on Zoloft, Risperdal, Depakote, and Klonopin.  He appears to be fairly stable.    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  He is no longer on ACE inhibitors secondary to  acute renal insufficiency.  His blood pressure is marginal.  This will need to be followed.    Hyperlipidemia.  On Zocor.  This is well controlled.     CPT CODE: 97673

## 2014-05-12 ENCOUNTER — Non-Acute Institutional Stay (SKILLED_NURSING_FACILITY): Payer: Medicare Other | Admitting: Internal Medicine

## 2014-05-12 DIAGNOSIS — I119 Hypertensive heart disease without heart failure: Secondary | ICD-10-CM

## 2014-05-12 DIAGNOSIS — F201 Disorganized schizophrenia: Secondary | ICD-10-CM

## 2014-05-13 NOTE — Progress Notes (Addendum)
Patient ID: Kevin Humphrey, male   DOB: [birth year], 64 y.o.   MRN: [id number]              PROGRESS NOTE  DATE:  05/12/2014                    FACILITY: [facility]    LEVEL OF CARE:   SNF   Routine Visit   CHIEF COMPLAINT:  Review of general medical issues/[facility] visit.    HISTORY OF PRESENT ILLNESS:  Kevin Humphrey is a gentleman who came to this building in late 2013.   He has a long history of schizophrenia and seizure disorder.    He is on multiple psychiatric medications.    His functional status seems at baseline.    He had a fall earlier this month.  However, he was not injured.    He has had a weight loss of approximately eight pounds between December and January, according to the records.    PAST MEDICAL HISTORY/PROBLEM LIST:                  Recurrent falls.    History of pleural effusions.    Right rib fractures.    Compression fractures at T10.    Hypernatremia with normal ammonia, without a history of liver disease and normal liver function tests.  History of gastroesophageal reflux disease.      History of depression with schizophrenia.    Hyperlipidemia.    Allergic rhinitis.    Iron deficiency anemia.    CURRENT MEDICATIONS:   Medication list is reviewed.       Hydrochlorothiazide 12.5 q.d.    Zoloft 150 q.d.     Lasix 20 every other day for edema.    Allopurinol 100 q.d.     Nasacort in each nostril twice daily for allergic rhinitis.    Cogentin 1 mg twice daily.    Pepcid 20 q.d.    Depakote 375 b.i.d.    Risperdal 4 mg twice daily.    Klonopin 0.5 b.i.d.    Lipitor 20 q.d.     PHYSICAL EXAMINATION:              VITAL SIGNS:   TEMPERATURE:  97.5.   PULSE:  88.   RESPIRATIONS:  18.   BLOOD PRESSURE:  144/99.   CHEST/RESPIRATORY:  Clear air entry bilaterally.   CARDIOVASCULAR:  CARDIAC:   Heart sounds are normal.  There are no murmurs.     GASTROINTESTINAL:  LIVER/SPLEEN/KIDNEYS:  No liver, no spleen.  No tenderness.     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  He is on a complicated medication regimen which is followed by Psychiatry.    History of hypertension with heart disease and heart failure.  He is no longer on ACE inhibitors secondary to renal insufficiency.  However, his last lab work was normal with a BUN of 8 and a creatinine of 0.69.  His blood pressure remains marginally high.    Hyperlipidemia.  On Zocor.  This is well controlled.

## 2014-06-16 ENCOUNTER — Non-Acute Institutional Stay (SKILLED_NURSING_FACILITY): Payer: Medicare Other | Admitting: Internal Medicine

## 2014-06-16 DIAGNOSIS — F201 Disorganized schizophrenia: Secondary | ICD-10-CM

## 2014-06-16 DIAGNOSIS — I119 Hypertensive heart disease without heart failure: Secondary | ICD-10-CM

## 2014-06-19 NOTE — Progress Notes (Addendum)
Patient ID: Kevin Humphrey, male   DOB: Feb 20, 1951, 64 y.o.   MRN: [id number]              PROGRESS NOTE  DATE:  06/16/2014              FACILITY: [facility]         LEVEL OF CARE:   SNF   Routine Visit   CHIEF COMPLAINT:   Review of general medical issues/[facility] visit.      HISTORY OF PRESENT ILLNESS:  Kevin Humphrey is a gentleman who came to this building in late 2013.    He has a long history of schizophrenia, perhaps depression, and seizure disorder.   He is on multiple psychiatric medications.  In general, he occasionally yells out, becomes paranoid, although this all seems to be under reasonable control.    PAST MEDICAL HISTORY/PROBLEM LIST:                                        Recurrent falls.    History of pleural effusions.    Right rib fracture.    Compression fractures of T10.    History of gastroesophageal reflux disease.    History of depression and schizophrenia.    Hyperlipidemia.    Allergic rhinitis.    Iron deficiency anemia.        CURRENT MEDICATIONS:    Medication list is reviewed.        Multivitamin 1 q.d.     Hydrochlorothiazide 12.5 q.d.    Zoloft 150 q.d.       Lasix 20 q.d.        Allopurinol 100 q.d.        Nasacort 2 sprays into each nostril twice daily for allergic rhinitis.      Cogentin 1 mg twice daily.     Pepcid 20 q.d.        Depakote 375 twice daily.      Risperdal 4 mg b.i.d.        Klonopin 0.5 b.i.d.    Lipitor 20 q.d.        Catapres 0.1 mg for blood pressure greater than 086 systolic.        Klonopin 1 mg at 9 p.m.        Vitamin D3, 50,000 U monthly.       PHYSICAL EXAMINATION:                VITAL SIGNS:   WEIGHT:  225 pounds.    This has fluctuated somewhat, but appears to be stable.     CHEST/RESPIRATORY:  Clear air entry bilaterally.      CARDIOVASCULAR:  CARDIAC:   Heart sounds are normal.   GASTROINTESTINAL:  ABDOMEN:   Soft.  There is no tenderness.      ASSESSMENT/PLAN:                   History of schizophrenia/psychosis/major depression.  He is on a complicated medication regimen, which actually seems to be helping.    He is followed by Psychiatry.    History of hypertension with heart failure.  No longer on ACE inhibitors secondary to renal insufficiency, although his BUN and creatinine were last checked in November and were quite normal.      Hyperlipidemia.  On Zocor.  This is well controlled.

## 2014-07-28 ENCOUNTER — Non-Acute Institutional Stay (SKILLED_NURSING_FACILITY): Payer: Medicare Other | Admitting: Internal Medicine

## 2014-07-28 DIAGNOSIS — F201 Disorganized schizophrenia: Secondary | ICD-10-CM

## 2014-07-28 DIAGNOSIS — R410 Disorientation, unspecified: Secondary | ICD-10-CM | POA: Diagnosis not present

## 2014-07-28 DIAGNOSIS — R569 Unspecified convulsions: Secondary | ICD-10-CM | POA: Diagnosis not present

## 2014-08-01 NOTE — Progress Notes (Signed)
Patient ID: Kevin Humphrey, male   DOB: [birth year], 64 y.o.   MRN: [id number]                PROGRESS NOTE  DATE:  07/28/2014         FACILITY: [facility]                       LEVEL OF CARE:   SNF   Acute Visit                CHIEF COMPLAINT:  Altered LOC.      HISTORY OF PRESENT ILLNESS:  This is a patient who has been a longstanding resident in this building, I think dating back to at least 2013.    His predominant issues include schizophrenia with a history of depression.  He is on multiple psychiatric medications.  There is some reference in his own notes to "febrile convulsions".  He also has a history of encephalopathy with an elevated ammonia level, but without evidence of underlying liver disease.  This all dates back to another facility in 2013.    As mentioned, most of his issues in the facility have been psychiatric.   He is followed by Psychiatry.  Pushes himself around in a wheelchair.  Most of the time, he has been fairly easy to manage.    Apparently, he woke up this morning and was not as responsive as usual per the nursing assistant who usually looks after him.  He did not eat any breakfast.  I do not believe he has had any lunch.    LABORATORY DATA:  Recent lab work on 07/24/2014:     Essentially normal CBC other than a low hemoglobin at 12.8, white count 7.4.     Comprehensive metabolic panel:  Sodium 272, BUN 7, creatinine 0.72, AST 13, ALT 10, albumin 3.6.    B12 level 48.    Uric acid level 7.   Folic acid 53.6, ferritin 101.    CURRENT MEDICATIONS:  Medication list is reviewed.      Hydrochlorothiazide 12.5 q.d.    Zoloft 150 q.d.        Lasix 20 q.d.        Allopurinol 100 q.d.       Pepcid 20 q.d.         Nasacort b.i.d.      Cogentin 1 mg b.i.d.        Depakote 275 b.i.d.    Risperdal 4 mg b.i.d.            Klonopin 0.5 b.i.d. at 8 a.m. and 12 p.m.    Klonopin 1 mg at bedtime.    Neudexta 20/10, 1 tablet twice daily.     Lipitor 10 q.d.         Catapres 0.1 p.r.n. for blood pressure greater than 180, which he receives fairly often.    REVIEW OF SYSTEMS:    GENERAL:  No recent history of falls or head trauma that I am aware of.    PHYSICAL EXAMINATION:   VITAL SIGNS:   TEMPERATURE:  97.2.      O2 SATURATIONS:  95%.    RESPIRATIONS:    18.    PULSE:     68.     BLOOD PRESSURE:  110/60.   HEENT:   HEAD/NECK:  No nuchal rigidity.   EYES:  Pupils are equal and reactive.   MOUTH/THROAT:    I could not look in his  mouth.  He does look to have somewhat dry mucous membranes.   CHEST/RESPIRATORY:  A few crackles in the right lower lobe.      CARDIOVASCULAR:   CARDIAC:  Heart sounds are normal.  There are no murmurs.   Perhaps some degree of volume contraction, although this does not look to be a major issue at the bedside.   GASTROINTESTINAL:   LIVER/SPLEEN/KIDNEYS:  No stigmata of chronic liver disease.   Liver edge is not palpable.  No spleen.  No shifting dullness.  No obvious asterixis.   NEUROLOGICAL:    CRANIAL NERVES:   SENSATION/STRENGTH:  He does respond to noxious stimuli by withdrawing.  Otherwise, I cannot get him to say anything, which is not this man's norm.  Normally, he will engage in simple conversation with you.      DEEP TENDON REFLEXES:  He has contractures in his lower extremities.  Reflexes are reduced, but symmetric.  Toes are downgoing bilaterally.  There is no Hoffman's reflex.    ASSESSMENT/PLAN:                          Altered mental status/delirium.  I am going to do the usual things including repeating his basic metabolic panel, valproic acid level, CBC, ammonia level.  It is possible that he is postictal as there is some reference in the chart previously to seizure disorder, although we have certainly never had this here.  I think the valproic acid that he is on here is for behavioral issues.  I also think this is quite possibly an excess of psychiatric medications and I will  put most of these on hold.  He is going to require maintenance IV fluids and I would like to give him some time to declare himself as improving.  A chest x-ray, urine, C&S are also in order.     CPT CODE: 72620

## 2014-08-04 ENCOUNTER — Non-Acute Institutional Stay (SKILLED_NURSING_FACILITY): Payer: Medicare Other | Admitting: Internal Medicine

## 2014-08-04 DIAGNOSIS — R569 Unspecified convulsions: Secondary | ICD-10-CM

## 2014-08-04 DIAGNOSIS — R410 Disorientation, unspecified: Secondary | ICD-10-CM | POA: Diagnosis not present

## 2014-08-06 NOTE — Progress Notes (Signed)
Patient ID: Kevin Humphrey, male   DOB: 02-27-1951, 64 y.o.   MRN: [id number]                PROGRESS NOTE  DATE:  08/04/2014          FACILITY: [facility]                       LEVEL OF CARE:   SNF   Acute Visit               CHIEF COMPLAINT:  Review of altered LOC, which I reviewed last week.     HISTORY OF PRESENT ILLNESS:  This is a longstanding resident of the building.    His predominant issues include schizophrenia with a history of depression.  He is on multiple psychiatric medications.    There is some vague reference in his notes of seizures, although I have never really been able to sort this through.     I saw him last week.  He was lethargic, only arousable to noxious stimuli.   We have him some IV fluids, did some lab work on him.  Essentially, the CBC with diff was normal.  Valproic acid level was 41.5.  An ammonia level was 138, although I am really not able to sort through the relevance of this.    Apparently, he was back to his normal self the next day; eating, drinking, pushing himself around in the wheelchair.  He did not have an obvious seizure.    CURRENT MEDICATIONS:  Medication list is reviewed.  He is back on all of his psychiatric medications.  I discontinued his Lasix.    REVIEW OF SYSTEMS:   Very difficult with this man.  However:    CHEST/RESPIRATORY:  No respiratory complaints.    CARDIAC:  No cardiac complaints.      PHYSICAL EXAMINATION:   VITAL SIGNS:   O2 SATURATIONS:  95%.     RESPIRATIONS:    18.     PULSE:     74.     CHEST/RESPIRATORY:  Clear air entry bilaterally.    CARDIOVASCULAR:   CARDIAC:  Heart sounds are normal.  There are no murmurs.   He appears to be euvolemic.   GASTROINTESTINAL:   ABDOMEN:  No tenderness.     LIVER/SPLEEN/KIDNEYS:  No stigmata of chronic liver disease.   No asterixis.    ASSESSMENT/PLAN:                     Acute delirium last week, which basically took 24 hours to resolve.  Although he has an elevated  ammonia level, the specimen was not processed properly.  He is on valproic acid.   I really do not know what to make of this and, for now, I am simply going to follow him clinically without altering any of his psychiatric medications.  There is some reference in his old chart to a seizure disorder.  I cannot exactly rule that out, although we will follow him expectantly for now.     CPT CODE: 54656

## 2014-08-10 ENCOUNTER — Other Ambulatory Visit: Payer: Self-pay | Admitting: *Deleted

## 2014-08-10 MED ORDER — CLONAZEPAM 0.5 MG PO TABS: ORAL_TABLET | ORAL | Status: DC

## 2014-08-10 NOTE — Telephone Encounter (Signed)
[facility] 

## 2014-08-25 ENCOUNTER — Non-Acute Institutional Stay (SKILLED_NURSING_FACILITY): Payer: Medicare Other | Admitting: Internal Medicine

## 2014-08-25 DIAGNOSIS — F201 Disorganized schizophrenia: Secondary | ICD-10-CM

## 2014-08-25 DIAGNOSIS — I119 Hypertensive heart disease without heart failure: Secondary | ICD-10-CM | POA: Diagnosis not present

## 2014-08-27 NOTE — Progress Notes (Addendum)
Patient ID: Kevin Humphrey, male   DOB: 1950/09/21, 64 y.o.   MRN: [id number]                PROGRESS NOTE  DATE:  08/25/2014             FACILITY: [facility]                        LEVEL OF CARE:   SNF   Routine Visit                      CHIEF COMPLAINT:  Routine visit to follow medical issues/[facility] visit.       HISTORY OF PRESENT ILLNESS:  This is a patient who came to [place] in the building in late 2013.    He has a long history of schizophrenia, perhaps depression, and some suggestion of seizure disorder.    He is on multiple psychiatric medications.    However, he has really been quite stable of late.  Pushes himself around in a wheelchair.      I saw him in early April, at which time he had an altered LOC.  The cause of this was really not determined.   I gave him some IV fluid and he gradually seemed to recover.  His Lasix was discontinued.           PAST MEDICAL HISTORY/PROBLEM LIST:                 Recurrent falls.    History of pleural effusion.    Right rib fracture.    Compression fracture of T10.    History of gastroesophageal reflux disease.      History of disorganized schizophrenia and depression.      Hyperlipidemia.      Allergic rhinitis.    Iron deficiency anemia.    CURRENT MEDICATIONS:  Medication list is reviewed.             Hydrochlorothiazide 12.5 q.d.     Multivitamin daily.    Zoloft 150 q.d.        Allopurinol 100 q.d.       Pepcid 20 q.d.       Nasacort 2 sprays each nostril daily.        Cogentin 1 mg twice daily.      Depakote 375 b.i.d. and noon.    Risperdal 4 mg b.i.d.       Nuedexta 20/10 b.i.d.       Klonopin 0.5 b.i.d.        Tylenol 650 three times a day.    Lipitor 20 q.d.       Klonopin 0.5 at 9 p.m.    Vitamin D3, 50,000 U monthly.    LABORATORY DATA:   Recent lab work on 07/29/2014:      CBC with diff:  Essentially normal.    Valproic acid level 41.5.    Ammonia level 138.      Nevertheless,  the delirium seemed to resolve on its own.   I did not alter the valproic acid or treat the ammonia level.      PHYSICAL EXAMINATION:   VITAL SIGNS:     PULSE:  60.    RESPIRATIONS:  19.     BLOOD PRESSURE:  120/70.   WEIGHT:  225 pounds, stable.     CHEST/RESPIRATORY:  Clear air entry bilaterally.    CARDIOVASCULAR:   CARDIAC:  Heart sounds are  normal.   GASTROINTESTINAL:   ABDOMEN:  There are no masses.       ASSESSMENT/PLAN:                      History of schizophrenia with psychosis and major depression.  He is on a complicated psychiatric regimen.  When I took him off this last month, he rapidly became more agitated.  He is followed by Psychiatry.    Hypertension with heart failure.    Hyperlipidemia.  On Zocor.

## 2014-09-30 ENCOUNTER — Other Ambulatory Visit: Payer: Self-pay | Admitting: Internal Medicine

## 2014-10-08 ENCOUNTER — Other Ambulatory Visit: Payer: Self-pay | Admitting: Internal Medicine

## 2014-10-08 DIAGNOSIS — N281 Cyst of kidney, acquired: Secondary | ICD-10-CM

## 2014-10-16 ENCOUNTER — Encounter (HOSPITAL_COMMUNITY): Payer: Self-pay

## 2014-10-16 ENCOUNTER — Ambulatory Visit (HOSPITAL_COMMUNITY): Payer: Medicaid Other

## 2014-10-16 ENCOUNTER — Ambulatory Visit (HOSPITAL_COMMUNITY)
Admission: RE | Admit: 2014-10-16 | Discharge: 2014-10-16 | Disposition: A | Discharge status: Home / Self Care | Payer: Medicare Other | Source: Ambulatory Visit | Attending: Internal Medicine | Admitting: Internal Medicine

## 2014-10-16 DIAGNOSIS — M8580 Other specified disorders of bone density and structure, unspecified site: Secondary | ICD-10-CM | POA: Insufficient documentation

## 2014-10-16 DIAGNOSIS — I2510 Atherosclerotic heart disease of native coronary artery without angina pectoris: Secondary | ICD-10-CM | POA: Insufficient documentation

## 2014-10-16 DIAGNOSIS — N281 Cyst of kidney, acquired: Secondary | ICD-10-CM | POA: Diagnosis not present

## 2014-10-16 DIAGNOSIS — J90 Pleural effusion, not elsewhere classified: Secondary | ICD-10-CM | POA: Insufficient documentation

## 2014-10-16 DIAGNOSIS — M160 Bilateral primary osteoarthritis of hip: Secondary | ICD-10-CM | POA: Insufficient documentation

## 2014-10-16 DIAGNOSIS — R109 Unspecified abdominal pain: Secondary | ICD-10-CM | POA: Insufficient documentation

## 2014-10-16 DIAGNOSIS — K219 Gastro-esophageal reflux disease without esophagitis: Secondary | ICD-10-CM | POA: Insufficient documentation

## 2014-10-16 DIAGNOSIS — K4020 Bilateral inguinal hernia, without obstruction or gangrene, not specified as recurrent: Secondary | ICD-10-CM | POA: Diagnosis not present

## 2014-10-16 DIAGNOSIS — N289 Disorder of kidney and ureter, unspecified: Secondary | ICD-10-CM | POA: Diagnosis not present

## 2014-10-16 MED ORDER — IOHEXOL 300 MG/ML  SOLN
100.0000 mL | Freq: Once | INTRAMUSCULAR | Status: AC | PRN
  Administered 2014-10-16: 100 mL via INTRAVENOUS

## 2014-10-20 ENCOUNTER — Non-Acute Institutional Stay (SKILLED_NURSING_FACILITY): Payer: Medicare Other | Admitting: Internal Medicine

## 2014-10-20 DIAGNOSIS — I10 Essential (primary) hypertension: Secondary | ICD-10-CM

## 2014-10-20 DIAGNOSIS — F201 Disorganized schizophrenia: Secondary | ICD-10-CM

## 2014-10-20 DIAGNOSIS — R569 Unspecified convulsions: Secondary | ICD-10-CM

## 2014-10-20 NOTE — Progress Notes (Signed)
Patient ID: Kevin Humphrey, male   DOB: [birth year], 64 y.o.   MRN: [id number]                PROGRESS NOTE  DATE:  10/20/2014           FACILITY: [facility]                        LEVEL OF CARE:   SNF   Routine Visit                      CHIEF COMPLAINT:  Routine visit to follow medical issues/[facility] visit.       HISTORY OF PRESENT ILLNESS:  This is a patient who came to [place] in the building in late 2013.    He has a long history of schizophrenia, perhaps depression, and some suggestion of seizure disorder.    He is on multiple psychiatric medications and follows with psychiatry in the facility.   However, he has really been quite stable of late.  Pushes himself around in a wheelchair.  He was seen by psychiatry earlier this month and started on Nuedexta. Depakote was reduced I think in part due to elevated ammonia levels although I don't see that the patient was adversely affected by this.    I saw him in early April, at which time he had an altered LOC.  The cause of this was really not determined.   I gave him some IV fluid and he gradually seemed to recover.  His Lasix was discontinued.        Think he vaguely was complaining of some abdominal pain and ultrasound done in the facility on 10/01/2014 showed a 2 x 1.6 x 1.6 cyst in the right kidney CT scan was suggested [report shown below]  Past Medical History  Diagnosis Date  . GERD (gastroesophageal reflux disease) 08/02/2012  . Dysphagia, unspecified 08/02/2012  . Allergic rhinitis 08/02/2012  . Vertebral compression fracture 08/02/2012  . Other and unspecified hyperlipidemia 08/02/2012  . Schizophrenia 08/02/2012  . Depression 08/02/2012  [place] seizures     CURRENT MEDICATIONS:  Medication list is reviewed.             Hydrochlorothiazide 12.5 q.d.     Multivitamin daily.    Zoloft 150 q.d.        Allopurinol 100 q.d.       Pepcid 20 q.d.       Nasacort 2 sprays each nostril daily.        Cogentin 1 mg twice  daily.      Depakote 375 b.i.d. and noon.    Risperdal 4 mg b.i.d.       Nuedexta 20/10 b.i.d.       Klonopin 0.5 b.i.d.        Tylenol 650 three times a day.    Lipitor 20 q.d.       Klonopin 0.5 at 9 p.m.    Vitamin D3, 50,000 U monthly.    LABORATORY DATA:   Recent lab work on 6/13 CBC white count 6.4 hemoglobin 13.6 platelet count 259 BMP sodium at 134 potassium at 4.5 CO2 25 BUN at 9 creatinine of 0.67 Ammonia level at 114  PHYSICAL EXAMINATION:   VITAL SIGNS:     PULSE:  70  RESPIRATIONS:  20    BLOOD PRESSURE:  124/62 O2 sat 95% on 2 L  WEIGHT:  225 pounds, stable.     CHEST/RESPIRATORY:  Clear air entry bilaterally.    CARDIOVASCULAR:   CARDIAC:  Heart sounds are normal.   GASTROINTESTINAL:   ABDOMEN:  Slightly distended but no masses no tenderness no liver no spleen     ASSESSMENT/PLAN:                      History of schizophrenia with psychosis and major depression.  He is on a complicated psychiatric regimen.  When I took him off this last month, he rapidly became more agitated.  He is followed by Psychiatry.    Hypertension with heart failure.    Hyperlipidemia.  On Zocor.    Renal cysts with the report of the radiologist on the CT scan suggesting nothing that was suspicious  Seizure disorder I note that his Depakote has been tapered by psychiatry in 6/14. Last Depakote level was 41.5 on 4/616. I'm going to resume the at its previous dose. I'm assuming the consultant psychiatry people did not realize that he was also on this for seizures I am expecting the dose of this to be low however I don't feel pressured to load him. May have had a seizure in April at which I found him with altered LOC. Less the patient has some issue I am unaware of I don't know that the ammonia level is an issue  Renal cysts. I'm not exactly sure how we stumbled on this in the first place. He may have been complaining of vague abdominal pain and that's the reason for the  ultrasound.

## 2014-11-16 ENCOUNTER — Non-Acute Institutional Stay (SKILLED_NURSING_FACILITY): Payer: Medicare Other | Admitting: Internal Medicine

## 2014-11-16 DIAGNOSIS — R569 Unspecified convulsions: Secondary | ICD-10-CM

## 2014-11-16 DIAGNOSIS — R634 Abnormal weight loss: Secondary | ICD-10-CM

## 2014-11-16 DIAGNOSIS — E7220 Disorder of urea cycle metabolism, unspecified: Secondary | ICD-10-CM

## 2014-11-16 DIAGNOSIS — F201 Disorganized schizophrenia: Secondary | ICD-10-CM | POA: Diagnosis not present

## 2014-11-26 NOTE — Progress Notes (Addendum)
Patient ID: Kevin Humphrey, male   DOB: April 30, 1950, 64 y.o.   MRN: [id number]                PROGRESS NOTE  DATE:  11/16/2014           FACILITY: [facility]                     LEVEL OF CARE:   SNF   Routine Visit                     CHIEF COMPLAINT:  Routine visit/[facility] visit.      HISTORY OF PRESENT ILLNESS:  This is a patient who came to the building in 2013.    He has a long history of schizophrenia, perhaps depression, and some suggestion of a seizure disorder.    He generally pushes himself around in a wheelchair.    He is also followed by Psychiatry in the building.    Last month, he was complaining of abdominal pain.  An ultrasound showed a cyst in the left kidney.  A CT scan was done on 10/16/2014 that showed a complex left renal cyst.  It was not felt that the complex cyst was suspicious.       PAST MEDICAL HISTORY/PROBLEM LIST:           Gastroesophageal reflux disease.    Dysphagia.    Allergic rhinitis.    Vertebral compression fractures.    Hyperlipidemia.    Schizophrenia.    Depression.    Gout.    Falls.    Seizures.      CURRENT MEDICATIONS:  Medication list is reviewed.              Multivitamin daily.    Hydrochlorothiazide 12.5 q.d.      Zoloft 150 q.d.      Pepcid 20 q.d.      Nasacort 2 sprays in each nostril twice a day.    Cogentin 1 mg b.i.d.      Risperdal 4 mg b.i.d. for psychosis.    Nuedexta 1 tablet twice daily.    Anulose 30 mL three times a day.     Catapres 0.1 t.i.d.      Vitamin D3, 50,000 U monthly.    Klonopin 0.5 at 8a and 12p, and 1 mg at bedtime.       Depakote Sprinkles 325 b.i.d.      PHYSICAL EXAMINATION:   VITAL SIGNS:     TEMPERATURE:  97.6.   PULSE:  78.    RESPIRATIONS:  18.     BLOOD PRESSURE:  122/74.   WEIGHT:  198 pounds, which generally reflects a gradual loss for him.  He was 217 pounds in January of this year.       CHEST/RESPIRATORY:  Clear air entry bilaterally.      CARDIOVASCULAR:   CARDIAC:  Heart sounds are normal.  There are no murmurs.    GASTROINTESTINAL:   ABDOMEN:  Large, with no tenderness.  No masses.   No liver no spleen Neurologic: No obvious lateralizing signs. No asterixis   ASSESSMENT/PLAN:                   Generalized seizure disorder.  I do not believe he has had a recent seizure.  He is on Depakote.    Hyperammonaemia.  It was felt that this was secondary to Depakote.  He was put on lactulose.  Assuming that he  is symptomatic from the hyperammonaemia, which is really a supposition at this point, I would simply discontinue the Depakote.  An additional agent for his seizures would have to be found.    Weight loss.  Again, the cause behind this is not totally clear based on bedside physical exam.     Schizophrenia.  During a spell of what I thought was delirium in May, I stopped all of his psychiatric medications.  He did recover from this and the cause was never really clear, although this resulted in marked increase in his psychotic  behavior.    Somewhat of a declining status.    Apparently, a recent ammonia level was ordered but refused by the patient.  The last one was on 11/04/2014 at 145.  I should mention that he does not have a history of liver disease that I am aware.   His liver function tests remain normal.  Amylase and lipase are normal.  Finally, ammonia levels are neither sensitive, nor specific, for liver disease.  I am not completely certain this man is symptomatic from this.     CPT CODE: 77412

## 2014-12-08 ENCOUNTER — Non-Acute Institutional Stay (SKILLED_NURSING_FACILITY): Payer: Medicare Other | Admitting: Internal Medicine

## 2014-12-08 DIAGNOSIS — R569 Unspecified convulsions: Secondary | ICD-10-CM | POA: Diagnosis not present

## 2014-12-08 DIAGNOSIS — E7220 Disorder of urea cycle metabolism, unspecified: Secondary | ICD-10-CM | POA: Diagnosis not present

## 2014-12-08 DIAGNOSIS — F201 Disorganized schizophrenia: Secondary | ICD-10-CM | POA: Diagnosis not present

## 2014-12-12 ENCOUNTER — Emergency Department (HOSPITAL_COMMUNITY): Payer: Medicare Other

## 2014-12-12 ENCOUNTER — Inpatient Hospital Stay (HOSPITAL_COMMUNITY): Payer: Medicare Other

## 2014-12-12 ENCOUNTER — Inpatient Hospital Stay (HOSPITAL_COMMUNITY)
Admission: EM | Admit: 2014-12-12 | Discharge: 2014-12-26 | DRG: 870 | Disposition: A | Discharge status: Hospice | Payer: Medicare Other | Attending: Internal Medicine | Admitting: Internal Medicine

## 2014-12-12 ENCOUNTER — Encounter (HOSPITAL_COMMUNITY): Payer: Self-pay | Admitting: *Deleted

## 2014-12-12 DIAGNOSIS — Z79891 Long term (current) use of opiate analgesic: Secondary | ICD-10-CM | POA: Diagnosis not present

## 2014-12-12 DIAGNOSIS — E8770 Fluid overload, unspecified: Secondary | ICD-10-CM | POA: Diagnosis not present

## 2014-12-12 DIAGNOSIS — R7881 Bacteremia: Secondary | ICD-10-CM | POA: Diagnosis not present

## 2014-12-12 DIAGNOSIS — G9340 Encephalopathy, unspecified: Secondary | ICD-10-CM | POA: Diagnosis present

## 2014-12-12 DIAGNOSIS — E46 Unspecified protein-calorie malnutrition: Secondary | ICD-10-CM | POA: Diagnosis present

## 2014-12-12 DIAGNOSIS — Z515 Encounter for palliative care: Secondary | ICD-10-CM | POA: Diagnosis not present

## 2014-12-12 DIAGNOSIS — E870 Hyperosmolality and hypernatremia: Secondary | ICD-10-CM | POA: Diagnosis present

## 2014-12-12 DIAGNOSIS — I10 Essential (primary) hypertension: Secondary | ICD-10-CM | POA: Diagnosis not present

## 2014-12-12 DIAGNOSIS — E440 Moderate protein-calorie malnutrition: Secondary | ICD-10-CM | POA: Diagnosis present

## 2014-12-12 DIAGNOSIS — J1520 Pneumonia due to staphylococcus, unspecified: Secondary | ICD-10-CM | POA: Diagnosis not present

## 2014-12-12 DIAGNOSIS — J690 Pneumonitis due to inhalation of food and vomit: Secondary | ICD-10-CM | POA: Diagnosis present

## 2014-12-12 DIAGNOSIS — E785 Hyperlipidemia, unspecified: Secondary | ICD-10-CM | POA: Diagnosis present

## 2014-12-12 DIAGNOSIS — L8990 Pressure ulcer of unspecified site, unspecified stage: Secondary | ICD-10-CM | POA: Diagnosis present

## 2014-12-12 DIAGNOSIS — Z978 Presence of other specified devices: Secondary | ICD-10-CM

## 2014-12-12 DIAGNOSIS — Z8701 Personal history of pneumonia (recurrent): Secondary | ICD-10-CM

## 2014-12-12 DIAGNOSIS — R1310 Dysphagia, unspecified: Secondary | ICD-10-CM | POA: Diagnosis present

## 2014-12-12 DIAGNOSIS — R532 Functional quadriplegia: Secondary | ICD-10-CM | POA: Diagnosis present

## 2014-12-12 DIAGNOSIS — J9601 Acute respiratory failure with hypoxia: Secondary | ICD-10-CM | POA: Diagnosis present

## 2014-12-12 DIAGNOSIS — Z9911 Dependence on respirator [ventilator] status: Secondary | ICD-10-CM | POA: Diagnosis not present

## 2014-12-12 DIAGNOSIS — Z22322 Carrier or suspected carrier of Methicillin resistant Staphylococcus aureus: Secondary | ICD-10-CM | POA: Diagnosis not present

## 2014-12-12 DIAGNOSIS — N390 Urinary tract infection, site not specified: Secondary | ICD-10-CM | POA: Diagnosis present

## 2014-12-12 DIAGNOSIS — F209 Schizophrenia, unspecified: Secondary | ICD-10-CM | POA: Diagnosis present

## 2014-12-12 DIAGNOSIS — Z87891 Personal history of nicotine dependence: Secondary | ICD-10-CM

## 2014-12-12 DIAGNOSIS — J15212 Pneumonia due to Methicillin resistant Staphylococcus aureus: Secondary | ICD-10-CM | POA: Diagnosis present

## 2014-12-12 DIAGNOSIS — J9690 Respiratory failure, unspecified, unspecified whether with hypoxia or hypercapnia: Secondary | ICD-10-CM | POA: Diagnosis present

## 2014-12-12 DIAGNOSIS — N179 Acute kidney failure, unspecified: Secondary | ICD-10-CM | POA: Diagnosis present

## 2014-12-12 DIAGNOSIS — J9600 Acute respiratory failure, unspecified whether with hypoxia or hypercapnia: Secondary | ICD-10-CM | POA: Diagnosis not present

## 2014-12-12 DIAGNOSIS — J189 Pneumonia, unspecified organism: Secondary | ICD-10-CM | POA: Diagnosis present

## 2014-12-12 DIAGNOSIS — Y95 Nosocomial condition: Secondary | ICD-10-CM | POA: Diagnosis present

## 2014-12-12 DIAGNOSIS — N3001 Acute cystitis with hematuria: Secondary | ICD-10-CM | POA: Diagnosis present

## 2014-12-12 DIAGNOSIS — Z66 Do not resuscitate: Secondary | ICD-10-CM | POA: Diagnosis not present

## 2014-12-12 DIAGNOSIS — Z79899 Other long term (current) drug therapy: Secondary | ICD-10-CM | POA: Diagnosis not present

## 2014-12-12 DIAGNOSIS — A419 Sepsis, unspecified organism: Secondary | ICD-10-CM | POA: Diagnosis present

## 2014-12-12 DIAGNOSIS — F329 Major depressive disorder, single episode, unspecified: Secondary | ICD-10-CM | POA: Diagnosis present

## 2014-12-12 DIAGNOSIS — B9689 Other specified bacterial agents as the cause of diseases classified elsewhere: Secondary | ICD-10-CM | POA: Diagnosis present

## 2014-12-12 DIAGNOSIS — E876 Hypokalemia: Secondary | ICD-10-CM | POA: Diagnosis present

## 2014-12-12 DIAGNOSIS — R0600 Dyspnea, unspecified: Secondary | ICD-10-CM

## 2014-12-12 DIAGNOSIS — Z7951 Long term (current) use of inhaled steroids: Secondary | ICD-10-CM | POA: Diagnosis not present

## 2014-12-12 DIAGNOSIS — K219 Gastro-esophageal reflux disease without esophagitis: Secondary | ICD-10-CM | POA: Diagnosis present

## 2014-12-12 DIAGNOSIS — D638 Anemia in other chronic diseases classified elsewhere: Secondary | ICD-10-CM | POA: Diagnosis present

## 2014-12-12 DIAGNOSIS — F0390 Unspecified dementia without behavioral disturbance: Secondary | ICD-10-CM | POA: Diagnosis present

## 2014-12-12 DIAGNOSIS — R6521 Severe sepsis with septic shock: Secondary | ICD-10-CM | POA: Diagnosis present

## 2014-12-12 LAB — URINALYSIS, ROUTINE W REFLEX MICROSCOPIC
Glucose, UA: NEGATIVE mg/dL
KETONES UR: NEGATIVE mg/dL
Nitrite: NEGATIVE
PROTEIN: 100 mg/dL — AB
Specific Gravity, Urine: 1.019 (ref 1.005–1.030)
Urobilinogen, UA: 0.2 mg/dL (ref 0.0–1.0)
pH: 5 (ref 5.0–8.0)

## 2014-12-12 LAB — POCT I-STAT 3, ART BLOOD GAS (G3+)
ACID-BASE DEFICIT: 6 mmol/L — AB (ref 0.0–2.0)
Acid-base deficit: 7 mmol/L — ABNORMAL HIGH (ref 0.0–2.0)
Bicarbonate: 18.2 mEq/L — ABNORMAL LOW (ref 20.0–24.0)
Bicarbonate: 18.2 mEq/L — ABNORMAL LOW (ref 20.0–24.0)
O2 SAT: 97 %
O2 SAT: 91 %
PCO2 ART: 37.1 mmHg (ref 35.0–45.0)
PH ART: 7.305 — AB (ref 7.350–7.450)
PO2 ART: 110 mmHg — AB (ref 80.0–100.0)
Patient temperature: 101
TCO2: 19 mmol/L (ref 0–100)
TCO2: 19 mmol/L (ref 0–100)
pCO2 arterial: 29.7 mmHg — ABNORMAL LOW (ref 35.0–45.0)
pH, Arterial: 7.396 (ref 7.350–7.450)
pO2, Arterial: 61 mmHg — ABNORMAL LOW (ref 80.0–100.0)

## 2014-12-12 LAB — COMPREHENSIVE METABOLIC PANEL
ALBUMIN: 3.3 g/dL — AB (ref 3.5–5.0)
ALK PHOS: 54 U/L (ref 38–126)
ALT: 13 U/L — AB (ref 17–63)
AST: 32 U/L (ref 15–41)
Anion gap: 14 (ref 5–15)
BILIRUBIN TOTAL: 1 mg/dL (ref 0.3–1.2)
BUN: 18 mg/dL (ref 6–20)
CALCIUM: 8.4 mg/dL — AB (ref 8.9–10.3)
CO2: 19 mmol/L — ABNORMAL LOW (ref 22–32)
CREATININE: 1.66 mg/dL — AB (ref 0.61–1.24)
Chloride: 106 mmol/L (ref 101–111)
GFR calc Af Amer: 48 mL/min — ABNORMAL LOW (ref >60)
GFR, EST NON AFRICAN AMERICAN: 42 mL/min — AB (ref >60)
GLUCOSE: 138 mg/dL — AB (ref 65–99)
POTASSIUM: 4.3 mmol/L (ref 3.5–5.1)
Sodium: 139 mmol/L (ref 135–145)
TOTAL PROTEIN: 6.5 g/dL (ref 6.5–8.1)

## 2014-12-12 LAB — I-STAT CHEM 8, ED
BUN: 23 mg/dL — ABNORMAL HIGH (ref 6–20)
Calcium, Ion: 1.07 mmol/L — ABNORMAL LOW (ref 1.13–1.30)
Chloride: 107 mmol/L (ref 101–111)
Creatinine, Ser: 1.6 mg/dL — ABNORMAL HIGH (ref 0.61–1.24)
Glucose, Bld: 136 mg/dL — ABNORMAL HIGH (ref 65–99)
HCT: 48 % (ref 39.0–52.0)
Hemoglobin: 16.3 g/dL (ref 13.0–17.0)
Potassium: 3.9 mmol/L (ref 3.5–5.1)
Sodium: 142 mmol/L (ref 135–145)
TCO2: 22 mmol/L (ref 0–100)

## 2014-12-12 LAB — MRSA PCR SCREENING: MRSA BY PCR: POSITIVE — AB

## 2014-12-12 LAB — CBC WITH DIFFERENTIAL/PLATELET
BASOS ABS: 0 10*3/uL (ref 0.0–0.1)
Basophils Relative: 0 % (ref 0–1)
Eosinophils Absolute: 0 10*3/uL (ref 0.0–0.7)
Eosinophils Relative: 0 % (ref 0–5)
HCT: 45.2 % (ref 39.0–52.0)
Hemoglobin: 14.6 g/dL (ref 13.0–17.0)
LYMPHS ABS: 0.4 10*3/uL — AB (ref 0.7–4.0)
Lymphocytes Relative: 11 % — ABNORMAL LOW (ref 12–46)
MCH: 30.7 pg (ref 26.0–34.0)
MCHC: 32.3 g/dL (ref 30.0–36.0)
MCV: 95.2 fL (ref 78.0–100.0)
MONO ABS: 0.7 10*3/uL (ref 0.1–1.0)
MONOS PCT: 17 % — AB (ref 3–12)
NEUTROS ABS: 2.9 10*3/uL (ref 1.7–7.7)
Neutrophils Relative %: 72 % (ref 43–77)
PLATELETS: 264 10*3/uL (ref 150–400)
RBC: 4.75 MIL/uL (ref 4.22–5.81)
RDW: 14.4 % (ref 11.5–15.5)
WBC Morphology: INCREASED
WBC: 4 10*3/uL (ref 4.0–10.5)

## 2014-12-12 LAB — I-STAT ARTERIAL BLOOD GAS, ED
Acid-base deficit: 4 mmol/L — ABNORMAL HIGH (ref 0.0–2.0)
BICARBONATE: 22.5 meq/L (ref 20.0–24.0)
O2 SAT: 96 %
TCO2: 24 mmol/L (ref 0–100)
pCO2 arterial: 48.5 mmHg — ABNORMAL HIGH (ref 35.0–45.0)
pH, Arterial: 7.279 — ABNORMAL LOW (ref 7.350–7.450)
pO2, Arterial: 102 mmHg — ABNORMAL HIGH (ref 80.0–100.0)

## 2014-12-12 LAB — I-STAT TROPONIN, ED: Troponin i, poc: 0.01 ng/mL (ref 0.00–0.08)

## 2014-12-12 LAB — GLUCOSE, CAPILLARY: Glucose-Capillary: 136 mg/dL — ABNORMAL HIGH (ref 65–99)

## 2014-12-12 LAB — URINE MICROSCOPIC-ADD ON

## 2014-12-12 LAB — I-STAT CG4 LACTIC ACID, ED: Lactic Acid, Venous: 1.26 mmol/L (ref 0.5–2.0)

## 2014-12-12 MED ORDER — DEXTROSE 5 % IV SOLN
2.0000 g | Freq: Three times a day (TID) | INTRAVENOUS | Status: DC
  Administered 2014-12-12 – 2014-12-14 (×6): 2 g via INTRAVENOUS
  Filled 2014-12-12 (×8): qty 2

## 2014-12-12 MED ORDER — PANTOPRAZOLE SODIUM 40 MG IV SOLR
40.0000 mg | Freq: Every day | INTRAVENOUS | Status: DC
  Administered 2014-12-12 – 2014-12-16 (×5): 40 mg via INTRAVENOUS
  Filled 2014-12-12 (×6): qty 40

## 2014-12-12 MED ORDER — ACETAMINOPHEN 650 MG RE SUPP
975.0000 mg | Freq: Once | RECTAL | Status: AC
  Administered 2014-12-12: 975 mg via RECTAL
  Filled 2014-12-12 (×2): qty 1

## 2014-12-12 MED ORDER — ROCURONIUM BROMIDE 50 MG/5ML IV SOLN
INTRAVENOUS | Status: AC | PRN
  Administered 2014-12-12: 100 mg via INTRAVENOUS

## 2014-12-12 MED ORDER — SODIUM CHLORIDE 0.9 % IV BOLUS (SEPSIS)
1000.0000 mL | INTRAVENOUS | Status: AC
  Administered 2014-12-12: 1000 mL via INTRAVENOUS

## 2014-12-12 MED ORDER — SODIUM CHLORIDE 0.9 % IV BOLUS (SEPSIS): 20000.0000 mL | Freq: Once | INTRAVENOUS | Status: DC

## 2014-12-12 MED ORDER — NOREPINEPHRINE BITARTRATE 1 MG/ML IV SOLN
5.0000 ug/min | INTRAVENOUS | Status: DC
  Administered 2014-12-12: 5 ug/min via INTRAVENOUS
  Administered 2014-12-12: 3 ug/min via INTRAVENOUS
  Filled 2014-12-12: qty 4

## 2014-12-12 MED ORDER — VANCOMYCIN HCL 10 G IV SOLR
1500.0000 mg | Freq: Once | INTRAVENOUS | Status: AC
  Administered 2014-12-12: 1500 mg via INTRAVENOUS
  Filled 2014-12-12: qty 1500

## 2014-12-12 MED ORDER — SODIUM CHLORIDE 0.9 % IV BOLUS (SEPSIS)
2000.0000 mL | Freq: Once | INTRAVENOUS | Status: AC
Start: 2014-12-12 — End: 2014-12-12
  Administered 2014-12-12: 2000 mL via INTRAVENOUS

## 2014-12-12 MED ORDER — VANCOMYCIN HCL IN DEXTROSE 750-5 MG/150ML-% IV SOLN
750.0000 mg | Freq: Two times a day (BID) | INTRAVENOUS | Status: DC
  Administered 2014-12-12 – 2014-12-13 (×2): 750 mg via INTRAVENOUS
  Filled 2014-12-12 (×4): qty 150

## 2014-12-12 MED ORDER — ETOMIDATE 2 MG/ML IV SOLN
INTRAVENOUS | Status: AC
Start: 2014-12-12 — End: 2014-12-12
  Filled 2014-12-12: qty 20

## 2014-12-12 MED ORDER — MIDAZOLAM HCL 2 MG/2ML IJ SOLN
1.0000 mg | INTRAMUSCULAR | Status: DC | PRN
  Administered 2014-12-13 – 2014-12-16 (×8): 2 mg via INTRAVENOUS
  Filled 2014-12-12 (×8): qty 2

## 2014-12-12 MED ORDER — NOREPINEPHRINE BITARTRATE 1 MG/ML IV SOLN
0.0000 ug/min | Freq: Once | INTRAVENOUS | Status: AC
  Administered 2014-12-12: 4 ug/min via INTRAVENOUS

## 2014-12-12 MED ORDER — HEPARIN SODIUM (PORCINE) 5000 UNIT/ML IJ SOLN
5000.0000 [IU] | Freq: Three times a day (TID) | INTRAMUSCULAR | Status: DC
  Administered 2014-12-12 – 2014-12-18 (×18): 5000 [IU] via SUBCUTANEOUS
  Filled 2014-12-12 (×22): qty 1

## 2014-12-12 MED ORDER — ROCURONIUM BROMIDE 50 MG/5ML IV SOLN
INTRAVENOUS | Status: AC
  Filled 2014-12-12: qty 2

## 2014-12-12 MED ORDER — SODIUM CHLORIDE 0.9 % IV SOLN
10.0000 ug/h | INTRAVENOUS | Status: DC
  Administered 2014-12-12: 10 ug/h via INTRAVENOUS
  Administered 2014-12-13: 300 ug/h via INTRAVENOUS
  Administered 2014-12-13: 250 ug/h via INTRAVENOUS
  Administered 2014-12-14: 200 ug/h via INTRAVENOUS
  Filled 2014-12-12 (×4): qty 50

## 2014-12-12 MED ORDER — SUCCINYLCHOLINE CHLORIDE 20 MG/ML IJ SOLN
INTRAMUSCULAR | Status: AC
  Filled 2014-12-12: qty 1

## 2014-12-12 MED ORDER — PANTOPRAZOLE SODIUM 40 MG IV SOLR: 40.0000 mg | INTRAVENOUS | Status: DC

## 2014-12-12 MED ORDER — SODIUM CHLORIDE 0.9 % IV SOLN
250.0000 mL | INTRAVENOUS | Status: DC | PRN
  Administered 2014-12-13: 1000 mL via INTRAVENOUS

## 2014-12-12 MED ORDER — PIPERACILLIN-TAZOBACTAM 3.375 G IVPB 30 MIN
3.3750 g | Freq: Once | INTRAVENOUS | Status: AC
  Administered 2014-12-12: 3.375 g via INTRAVENOUS
  Filled 2014-12-12: qty 50

## 2014-12-12 MED ORDER — PANTOPRAZOLE SODIUM 40 MG IV SOLR
40.0000 mg | Freq: Every day | INTRAVENOUS | Status: DC
Start: 2014-12-13 — End: 2014-12-12

## 2014-12-12 MED ORDER — CHLORHEXIDINE GLUCONATE 0.12% ORAL RINSE (MEDLINE KIT)
15.0000 mL | Freq: Two times a day (BID) | OROMUCOSAL | Status: DC
  Administered 2014-12-12 – 2014-12-15 (×7): 15 mL via OROMUCOSAL

## 2014-12-12 MED ORDER — ANTISEPTIC ORAL RINSE SOLUTION (CORINZ)
7.0000 mL | Freq: Four times a day (QID) | OROMUCOSAL | Status: DC
  Administered 2014-12-13 – 2014-12-16 (×15): 7 mL via OROMUCOSAL

## 2014-12-12 MED ORDER — LIDOCAINE HCL (CARDIAC) 20 MG/ML IV SOLN
INTRAVENOUS | Status: AC
  Filled 2014-12-12: qty 5

## 2014-12-12 NOTE — Progress Notes (Signed)
RT assisted Dr. Claudine Mouton with intubation. Pt bagged to 98%. Intubated w/ DL & MAC3. Copious thick yellow secretions occlude airway. Pt placed on Vent ARDS protocol per MD. Pt 6cc=450. RT will monitor.

## 2014-12-12 NOTE — Progress Notes (Signed)
ABG results reported to Dr. Ancil Linsey.  Results for Kevin Humphrey, Kevin Humphrey (MRN [id number]) as of 12/12/2014 05:07 pH, Arterial 7.279 (L)  pCO2 arterial 48.5 (H)  pO2, Arterial 102.0 (H)  Bicarbonate 22.5  TCO2 24  Acid-base deficit 4.0 (H)  O2 Saturation 96.0  Patient temp 100.7 F  Collection site RADIAL, ALLEN'S +   Vent settings adjusted:  Vt: 450 (6cc), RR: 35, Peep: 10, FiO2: 70%.  RT will monitor

## 2014-12-12 NOTE — H&P (Signed)
PULMONARY / CRITICAL CARE MEDICINE HISTORY AND PHYSICAL EXAMINATION   Name: Kevin Humphrey MRN: [id number] DOB: [birth year]    ADMISSION DATE:  12/12/2014  PRIMARY SERVICE: PCCM  CHIEF COMPLAINT:  AMS  BRIEF PATIENT DESCRIPTION: 64 y/o man ([place]) w/ hx of schizophrenia, prior PNA, p/w AMS found to have severe sepsis of pulmonary origin with respiratory failure.  SIGNIFICANT EVENTS / STUDIES:  Intubated in ED  LINES / TUBES: CVC RIJ (placed by ED) - 12/12/14  CULTURES: Blood - 12/12/14 - in process Lower resp Cx - 12/12/14 - NOT YET COLLECTED  ANTIBIOTICS: Pip-Tazo: 12/12/14 --> Vanc: 12/12/14 -->  HISTORY OF PRESENT ILLNESS:   Limited history available. Patient intubated. No family members / staff from nursing home available. No response from calling designated gaurdian. Kevin Humphrey is a 64 y/o man with a history of schizophrenia and possible seizure disorder, prior respiratory infection, who was brought to the ED after the staff in the nursing home found him in an altered state of consciousness. He was found to have "gargling" in his breathing, with decreased responsiveness in the night of admission, and was EMS was called. The patient was found to be hypoxic, and was intubated after arrival in the ED. He was also febrile and hypotensive, so he was cultured, a central was placed, and he was given IV antibiotics. PCCM was called for admission. A call to his listed on-call contact for guardian did not yield any return calls.  PAST MEDICAL HISTORY :  Past Medical History  Diagnosis Date  . GERD (gastroesophageal reflux disease) 08/02/2012  . Dysphagia, unspecified 08/02/2012  . Allergic rhinitis 08/02/2012  . Vertebral compression fracture 08/02/2012  . Other and unspecified hyperlipidemia 08/02/2012  . Schizophrenia 08/02/2012  . Depression 08/02/2012   History reviewed. No pertinent past surgical history. Prior to Admission medications   Medication Sig Start Date End Date Taking?  Authorizing Provider  atorvastatin (LIPITOR) 20 MG tablet Take 1 tablet (20 mg total) by mouth daily. 03/03/13   Gerlene Fee, NP  benztropine (COGENTIN) 2 MG tablet Take 1 mg by mouth 2 (two) times daily.     Historical Provider, MD  clonazePAM (KLONOPIN) 0.5 MG tablet Take one tablet by mouth twice daily for anxiety; Take two tablets by mouth every night at bedtime for anxiety; Take one tablet by mouth once daily as needed for breakthrough pain (per fax Rx) 08/10/14   Tiffany L Reed, DO  divalproex (DEPAKOTE SPRINKLE) 125 MG capsule Take 125 mg by mouth 2 (two) times daily.    Historical Provider, MD  famotidine (PEPCID) 20 MG tablet Take 20 mg by mouth 2 (two) times daily.    Historical Provider, MD  LORazepam (ATIVAN) 0.5 MG tablet Take one tablet by mouth twice daily as needed for anxiety/agitation 07/04/13   Tiffany L Reed, DO  Multiple Vitamin (MULTIVITAMIN) tablet Take 1 tablet by mouth daily.    Historical Provider, MD  omeprazole (PRILOSEC) 20 MG capsule Take 20 mg by mouth daily.    Historical Provider, MD  oxyCODONE-acetaminophen (PERCOCET/ROXICET) 5-325 MG per tablet Take 1 tablet by mouth every 6 (six) hours as needed for pain.    Historical Provider, MD  risperiDONE (RISPERDAL) 2 MG tablet Take 4 mg by mouth 2 (two) times daily. Takes 2 mg in the am and 4 mg in the pm    Historical Provider, MD  sertraline (ZOLOFT) 100 MG tablet Take 100 mg by mouth daily. Takes 200 mg daily    Historical  Provider, MD  traZODone (DESYREL) 150 MG tablet Take 150 mg by mouth at bedtime.    Historical Provider, MD  triamcinolone (NASACORT) 55 MCG/ACT nasal inhaler Place 2 sprays into the nose 2 (two) times daily.    Historical Provider, MD   No Known Allergies  FAMILY HISTORY:  History reviewed. No pertinent family history. SOCIAL HISTORY:  reports that he has quit smoking. He does not have any smokeless tobacco history on file. His alcohol and drug histories are not on file.  REVIEW OF SYSTEMS:   Unable to obtain.  SUBJECTIVE:   VITAL SIGNS: Temp:  [100.2 F (37.9 C)-101.8 F (38.8 C)] 100.2 F (37.9 C) (08/20 0530) Pulse Rate:  [57-143] 109 (08/20 0530) Resp:  [19-33] 33 (08/20 0530) BP: (75-152)/(33-89) 141/89 mmHg (08/20 0530) SpO2:  [90 %-100 %] 97 % (08/20 0530) FiO2 (%):  [70 %-100 %] 70 % (08/20 0505) Weight:  [211 lb (95.709 kg)-217 lb (98.431 kg)] 217 lb (98.431 kg) (08/20 0407) HEMODYNAMICS:   VENTILATOR SETTINGS: Vent Mode:  [-] PRVC FiO2 (%):  [70 %-100 %] 70 % Set Rate:  [28 bmp-35 bmp] 35 bmp Vt Set:  [450 mL] 450 mL PEEP:  [8 cmH20-10 cmH20] 10 cmH20 Plateau Pressure:  [22 cmH20-25 cmH20] 25 cmH20 INTAKE / OUTPUT: Intake/Output      08/19 0701 - 08/20 0700   I.V. (mL/kg) 1000 (10.2)   IV Piggyback 1050   Total Intake(mL/kg) 2050 (20.8)   Net +2050         PHYSICAL EXAMINATION: General:  Very pale man appearing stated age, intubated and sedated. Neuro:  Sedated HEENT:  MMM Neck: Normal airflow, no masses. Cardiovascular:  Tachycardic, no M/R/G Lungs:  Crackles heard with mechanical breath sounds. Copious thick secretions. Abdomen:  Soft Musculoskeletal:  No deformities. Skin:  No rashes.  LABS:  CBC  Recent Labs Lab 12/12/14 0415 12/12/14 0422  WBC 4.0  --   HGB 14.6 16.3  HCT 45.2 48.0  PLT 264  --    Coag's No results for input(s): APTT, INR in the last 168 hours. BMET  Recent Labs Lab 12/12/14 0415 12/12/14 0422  NA 139 142  K 4.3 3.9  CL 106 107  CO2 19*  --   BUN 18 23*  CREATININE 1.66* 1.60*  GLUCOSE 138* 136*   Electrolytes  Recent Labs Lab 12/12/14 0415  CALCIUM 8.4*   Sepsis Markers  Recent Labs Lab 12/12/14 0423  LATICACIDVEN 1.26   ABG  Recent Labs Lab 12/12/14 0458  PHART 7.279*  PCO2ART 48.5*  PO2ART 102.0*   Liver Enzymes  Recent Labs Lab 12/12/14 0415  AST 32  ALT 13*  ALKPHOS 54  BILITOT 1.0  ALBUMIN 3.3*   Cardiac Enzymes No results for input(s): TROPONINI, PROBNP in  the last 168 hours. Glucose No results for input(s): GLUCAP in the last 168 hours.  Imaging Ct Head Wo Contrast  12/12/2014   CLINICAL DATA:  Nursing home patient, found unresponsive, not responding to stimuli.  EXAM: CT HEAD WITHOUT CONTRAST  TECHNIQUE: Contiguous axial images were obtained from the base of the skull through the vertex without intravenous contrast.  COMPARISON:  None.  FINDINGS: The ventricles and sulci are normal for age. No intraparenchymal hemorrhage, mass effect nor midline shift. Patchy supratentorial white matter hypodensities are within normal range for patient's age and though non-specific suggest sequelae of chronic small vessel ischemic disease. No acute large vascular territory infarcts.  No abnormal extra-axial fluid collections. Basal cisterns are patent.  Mild calcific atherosclerosis of the carotid siphons and included vertebral arteries.  No skull fracture. The included ocular globes and orbital contents are non-suspicious. Soft tissue nearly completely opacifies the LEFT maxillary sinus with bony wall thickening consistent with acute on chronic sinusitis. Mild ethmoid mucosal thickening. The mastoid air cells are well aerated. Life support lines in place. The patient is edentulous.  IMPRESSION: Negative CT head.   Electronically Signed   By: Elon Alas M.D.   On: 12/12/2014 06:28   Dg Chest Portable 1 View  12/12/2014   CLINICAL DATA:  Endotracheal tube and central line placement.  EXAM: PORTABLE CHEST - 1 VIEW  COMPARISON:  None available for comparison at time of study interpretation.  FINDINGS: Endotracheal tube tip projects 4.2 cm above the carina. RIGHT internal jugular central venous catheter distal tip projects in mid superior vena cava. Nasogastric tube past the mid stomach, distal tip not imaged. The cardiac silhouette appears mildly enlarged. Mediastinal silhouette is nonsuspicious. Bronchitic changes, interstitial prominence in the lung bases with patchy  bibasilar airspace opacities and small pleural effusions. No pneumothorax. Old RIGHT rib fractures. Possible acute LEFT seventh versus eighth rib fracture. Old LEFT third rib fracture.  IMPRESSION: Endotracheal tube tip projects 4.2 cm above the carina. RIGHT internal jugular central venous catheter distal tip projects in mid superior vena cava. Nasogastric tube past the mid stomach, distal tip not imaged. No pneumothorax.  Interstitial prominence confluent in the lung bases suggest pulmonary edema, with small pleural effusions.  Possibly acute LEFT lateral seventh versus eighth rib fracture.   Electronically Signed   By: Elon Alas M.D.   On: 12/12/2014 04:57    EKG: Sinus tach CXR: Air bronchograms in the R lower lung field suggestive of PNA vs aspiration.   ASSESSMENT / PLAN:  Active Problems:   Pneumonia   PULMONARY A: Sepsis of pulmonary origin Possible aspiration Hypoxia Need for Mechanical Ventilation P:   Maintain on PRVC for now. Daily SBT. Obtain trachial aspirate for culture. Tx for HCAP.   CARDIOVASCULAR A: Septic Shock P:   Improving with fluids, tx of sepsis. Wean pressors  RENAL A: Mild Renal Insuffiency, unclear chronicity P:   Patient is certainly at risk for AKI, but no baseline labs for comparison. Will hydrate and monitor.  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  HEMATOLOGIC A: Bandemia high suggestive of acute SIRS and sepsis. P:   tx as above  INFECTIOUS A: Sepsis of pulmonary origin, health-care associated P:   Tx w/ ceftazidime and vanc. Although may have aspirated, acute illness highly unlikely to be due to anaerobic infection, so zosyn isn't ideal choice.  ENDOCRINE A: No known issues P:   Routine blood glucose checks.  NEUROLOGIC A: Hx of schizophrenia Need for sedation. P:   Holding home meds for now until can demonstrate if  needs to remain intubated. Fentanyl infusion  BEST PRACTICE / DISPOSITION Level of Care:  ICU Primary Service:  PCCM Consultants:  None Code Status:  Full Diet:  NPO DVT Px:  Heparin GI Px:  None Skin Integrity:  Intact Social / Family:  COULD NOT CONTACT AT TIME OF ADMISSION  TODAY'S SUMMARY: 64 y/o man with sepsis of pulmonary origin.  I have personally obtained a history, examined the patient, evaluated laboratory and imaging results, formulated the assessment and plan and placed orders.  CRITICAL CARE: The patient is critically ill with  multiple organ systems failure and requires high complexity decision making for assessment and support, frequent evaluation and titration of therapies, application of advanced monitoring technologies and extensive interpretation of multiple databases. Critical Care Time devoted to patient care services described in this note is 35 minutes.   Luz Brazen, MD Pulmonary and [place] Pager: [phone]   12/12/2014, 6:34 AM

## 2014-12-12 NOTE — Plan of Care (Signed)
Kevin Humphrey is patient's gaurdian respresntative [phone]) M-F 8:00 to 5:00  After hours: [phone]

## 2014-12-12 NOTE — Progress Notes (Signed)
RT note- fio2 increased post ABG. 

## 2014-12-12 NOTE — Progress Notes (Signed)
ANTIBIOTIC CONSULT NOTE - INITIAL  Pharmacy Consult for Vancomycin/Ceftazidime  Indication: rule out pneumonia  No Known Allergies  Patient Measurements: Height: 5\' 11"  (180.3 cm) Weight: 217 lb (98.431 kg) IBW/kg (Calculated) : 75.3  Vital Signs: Temp: 100.4 F (38 C) (08/20 0515) Temp Source: Rectal (08/20 0409) BP: 146/89 mmHg (08/20 0515) Pulse Rate: 121 (08/20 0515)  Labs:  Recent Labs  12/12/14 0415 12/12/14 0422  WBC PENDING  --   HGB 14.6 16.3  PLT 264  --   CREATININE 1.66* 1.60*   Estimated Creatinine Clearance: 55 mL/min (by C-G formula based on Cr of 1.6).  Medical History: Past Medical History  Diagnosis Date  . GERD (gastroesophageal reflux disease) 08/02/2012  . Dysphagia, unspecified 08/02/2012  . Allergic rhinitis 08/02/2012  . Vertebral compression fracture 08/02/2012  . Other and unspecified hyperlipidemia 08/02/2012  . Schizophrenia 08/02/2012  . Depression 08/02/2012    Assessment: 64 y/o M from nursing home unresponsive/hypotensive, required intubation in the ED, febrile in the ED, WBC pending, renal dysfunction noted, other labs reviewed.   Goal of Therapy:  Vancomycin trough level 15-20 mcg/ml  Plan:  -Vancomycin 750 mg IV q12h -Ceftazidime 2g IV q8h -Trend WBC, temp, renal function  -Drug levels as indicated   Narda Bonds 12/12/2014,5:34 AM

## 2014-12-12 NOTE — Progress Notes (Addendum)
Initial Nutrition Assessment  DOCUMENTATION CODES:  Non-severe (moderate) malnutrition in context of chronic illness  INTERVENTION:  If unable to extubate and medically able, recommend TF via OGT with Vital AF 1.2  at 25 ml/h and Prostat 30 ml BID on day 1; on day 2, d/c prostat increase to goal rate of 85 ml/h (2040 ml per day) to provide 2448 kcals, 153 gm protein, 1654 ml free water daily.  NUTRITION DIAGNOSIS:  Inadequate oral intake related to inability to eat as evidenced by NPO status.  GOAL:  Patient will meet greater than or equal to 90% of their needs  MONITOR:  Vent status, Diet advancement, Skin, Labs, I & O's, TF tolerance  REASON FOR ASSESSMENT:  Ventilator    ASSESSMENT:  64 y/o man ([place]) w/ hx of schizophrenia, prior PNA, p/w AMS found to have severe sepsis of pulmonary origin with respiratory failure.  On RD arrival, pt intubated and no potential historians.   NFPE: Mild orbital/temporal wasting of fat/muscle  Patient is currently intubated on ventilator support MV: 15.9 L/min Temp (24hrs), Avg:100.2 F (37.9 C), Min:98.9 F (37.2 C), Max:101.8 F (38.8 C)  Propofol: none  Diet Order:  Diet NPO time specified  Skin:  MSAD buttocks, Dry, PU stage 1 Buttocks  Last BM:  Unknown  Height:  Ht Readings from Last 1 Encounters:  12/12/14 5\' 11"  (1.803 m)   Weight:  Wt Readings from Last 1 Encounters:  12/12/14 217 lb (98.431 kg)   Wt Readings from Last 10 Encounters:  12/12/14 217 lb (98.431 kg)  02/28/13 211 lb (95.709 kg)  01/17/13 206 lb (93.441 kg)  09/27/12 184 lb (83.462 kg)  08/02/12 163 lb (73.936 kg)   Admit weight: 211 lbs (95.7 kg)  Ideal Body Weight:  78.18 kg  BMI:  Body mass index is 30.28 kg/(m^2).  Using admit weight: 29.4  Estimated Nutritional Needs:  Kcal:  2457 kcals Protein:  134-157 g (1.4-1.6g/kg bw)  Fluid:  Per MD  EDUCATION NEEDS:  No education needs identified at this time  Burtis Junes RD,  LDN Nutrition Pager: [phone] 12/12/2014 10:04 AM

## 2014-12-12 NOTE — Progress Notes (Signed)
RT note- No changes with ventilator today. Patient has periods of extreme agitation and restlessness. RN aware and E- Link notified and ABG ordered.

## 2014-12-12 NOTE — Progress Notes (Signed)
[place] Progress Note Patient Name: Kevin Humphrey DOB: [birth year] MRN: [id number]   Date of Service  12/12/2014  HPI/Events of Note  Patient on mechanical ventilator. Needs stress ulcer prophylaxis.   eICU Interventions  Will order Protonix IV.      Intervention Category Intermediate Interventions: Best-practice therapies (e.g. DVT, beta blocker, etc.)  Sommer,Steven Eugene 12/12/2014, 7:27 PM

## 2014-12-12 NOTE — Progress Notes (Signed)
Recruitment Maneuver preformed due to decreasing SpO2 86-88% and signs of possible aspiration.  Pt remained hemodynamically stable throughout recruitment. Upon completion RT deep suctioned Copious/thick/yellow secretions from airway. SpO2 increased to 98%. Pt currently on Vt: 450, RR: 28, Peep: 8, FiO2: 100%. ABG in 30 minutes. RT will monitor.

## 2014-12-12 NOTE — ED Notes (Signed)
[facility]. [facility] was found un[facility] and called EMS. On EMS arrival pt. SBP in the 70s, not responding to stimuli. Nursing home can only tell EMS about hx of pneumonia. Lungs sounded full with rhonchi and rales.

## 2014-12-12 NOTE — Progress Notes (Signed)
Pt transported to [id number] without complication. Report given to Lauren, RT

## 2014-12-12 NOTE — ED Provider Notes (Addendum)
CSN: [id number]     Arrival date & time 12/12/14  0340 History  This chart was scribed for Everlene Balls, MD by Evelene Croon, ED Scribe. This patient was seen in room [facility] and the patient's care was started 3:40 AM.     Chief Complaint  Patient presents with  . Code Sepsis  LEVEL 5 CAVEAT DUE TO ACUITY OF MEDICAL CONDITION   The history is provided by the EMS personnel. No language interpreter was used.    HPI Comments:  Kevin Humphrey is a 64 y.o. male brought in by ambulance, who presents to the Emergency Department unresponsive from NH. Per EMS they received very little history from the [facility] staff; they were only told of PNA history and that pt was found in his bed unresponsive. EMS reports intial oxygen saturations in the 70s. Pt was bagged in route improving O2 sat into the 90s.History limited due to acuity of condition.   Past Medical History  Diagnosis Date  . GERD (gastroesophageal reflux disease) 08/02/2012  . Dysphagia, unspecified 08/02/2012  . Allergic rhinitis 08/02/2012  . Vertebral compression fracture 08/02/2012  . Other and unspecified hyperlipidemia 08/02/2012  . Schizophrenia 08/02/2012  . Depression 08/02/2012   History reviewed. No pertinent past surgical history. History reviewed. No pertinent family history. Social History  Substance Use Topics  . Smoking status: Former Research scientist (life sciences)  . Smokeless tobacco: None  . Alcohol Use: None    Review of Systems  Unable to perform ROS: Acuity of condition      Allergies  Review of patient's allergies indicates no known allergies.  Home Medications   Prior to Admission medications   Medication Sig Start Date End Date Taking? Authorizing Provider  atorvastatin (LIPITOR) 20 MG tablet Take 1 tablet (20 mg total) by mouth daily. 03/03/13   Gerlene Fee, NP  benztropine (COGENTIN) 2 MG tablet Take 1 mg by mouth 2 (two) times daily.     Historical Provider, MD  clonazePAM (KLONOPIN) 0.5 MG tablet Take one tablet by mouth  twice daily for anxiety; Take two tablets by mouth every night at bedtime for anxiety; Take one tablet by mouth once daily as needed for breakthrough pain (per fax Rx) 08/10/14   Tiffany L Reed, DO  divalproex (DEPAKOTE SPRINKLE) 125 MG capsule Take 125 mg by mouth 2 (two) times daily.    Historical Provider, MD  famotidine (PEPCID) 20 MG tablet Take 20 mg by mouth 2 (two) times daily.    Historical Provider, MD  LORazepam (ATIVAN) 0.5 MG tablet Take one tablet by mouth twice daily as needed for anxiety/agitation 07/04/13   Tiffany L Reed, DO  Multiple Vitamin (MULTIVITAMIN) tablet Take 1 tablet by mouth daily.    Historical Provider, MD  omeprazole (PRILOSEC) 20 MG capsule Take 20 mg by mouth daily.    Historical Provider, MD  oxyCODONE-acetaminophen (PERCOCET/ROXICET) 5-325 MG per tablet Take 1 tablet by mouth every 6 (six) hours as needed for pain.    Historical Provider, MD  risperiDONE (RISPERDAL) 2 MG tablet Take 4 mg by mouth 2 (two) times daily. Takes 2 mg in the am and 4 mg in the pm    Historical Provider, MD  sertraline (ZOLOFT) 100 MG tablet Take 100 mg by mouth daily. Takes 200 mg daily    Historical Provider, MD  traZODone (DESYREL) 150 MG tablet Take 150 mg by mouth at bedtime.    Historical Provider, MD  triamcinolone (NASACORT) 55 MCG/ACT nasal inhaler Place 2 sprays into the nose  2 (two) times daily.    Historical Provider, MD   BP 80/33 mmHg  Pulse 135  Resp 25  Ht 5\' 10"  (1.778 m)  Wt 211 lb (95.709 kg)  BMI 30.28 kg/m2  SpO2 96% Physical Exam  Constitutional: Vital signs are normal. He appears well-developed and well-nourished.  Non-toxic appearance. He does not appear ill. He appears distressed.  HENT:  Head: Normocephalic and atraumatic.  Nose: Nose normal.  Dry oropharynx  Eyes: Conjunctivae and EOM are normal. Pupils are equal, round, and reactive to light. No scleral icterus.  Neck: Normal range of motion. Neck supple. No tracheal deviation, no edema, no erythema and  normal range of motion present. No thyroid mass and no thyromegaly present.  Cardiovascular: Regular rhythm, S1 normal, S2 normal, normal heart sounds, intact distal pulses and normal pulses.  Tachycardia present.  Exam reveals no gallop and no friction rub.   No murmur heard. Pulses:      Radial pulses are 2+ on the right side, and 2+ on the left side.       Dorsalis pedis pulses are 2+ on the right side, and 2+ on the left side.  Pulmonary/Chest: He is in respiratory distress. He has wheezes. He has no rhonchi. He has rales.  Rhonchi Accessory muscle use  Abdominal: Soft. Normal appearance and bowel sounds are normal. He exhibits no distension, no ascites and no mass. There is no hepatosplenomegaly. There is no tenderness. There is no rebound, no guarding and no CVA tenderness.  Musculoskeletal: Normal range of motion. He exhibits no edema or tenderness.  Lymphadenopathy:    He has no cervical adenopathy.  Neurological: He has normal strength. No sensory deficit.  Non-responsive to painful stimuli  Skin: Skin is warm, dry and intact. No petechiae and no rash noted. He is not diaphoretic. No erythema. No pallor.  Nursing note and vitals reviewed.   ED Course  Procedures   DIAGNOSTIC STUDIES:  Oxygen Saturation is 96% on nonrebreather, adequate by my interpretation.    3:40 AM I.O. placed in right tibia and left tibia  Angiocath insertion Performed by: Everlene Balls  Consent: Verbal consent obtained. Risks and benefits: risks, benefits and alternatives were discussed Time out: Immediately prior to procedure a "time out" was called to verify the correct patient, procedure, equipment, support staff and site/side marked as required.  Preparation: Patient was prepped and draped in the usual sterile fashion.  R tibial IO line  Gauge: 18G  Normal blood return and flush without difficulty Patient tolerance: Patient tolerated the procedure well with no immediate  complications.  Angiocath insertion Performed by: Everlene Balls  Consent: Verbal consent obtained. Risks and benefits: risks, benefits and alternatives were discussed Time out: Immediately prior to procedure a "time out" was called to verify the correct patient, procedure, equipment, support staff and site/side marked as required.  Preparation: Patient was prepped and draped in the usual sterile fashion.  L tibial IO line   Gauge: 18G  Normal blood return and flush without difficulty Patient tolerance: Patient tolerated the procedure well with no immediate complications.        3:57 AM INTUBATION Performed by: Everlene Balls, MD Required items: required blood products, implants, devices, and special equipment available Patient identity confirmed: provided demographic data and hospital-assigned identification number Time out: Immediately prior to procedure a "time out" was called to verify the correct patient, procedure, equipment, support staff and site/side marked as required.  Indications: Altered mental status, respiratory failure   Intubation method: Direct  Laryngoscopy   Preoxygenation: 100% BVM, 15 L nasal cannula   Sedatives: None  Paralytic: 100 mg Rocuronium  Tube Size: 8-0 cuffed  Post-procedure assessment: chest rise and ETCO2 monitor Breath sounds: equal and absent over the epigastrium Tube secured with: ETT holder Chest x-ray interpreted by radiologist and me.  Chest x-ray findings: endotracheal tube in appropriate position  Patient tolerated the procedure well with no immediate complications.  4:13 AM   CENTRAL LINE Performed by: Everlene Balls Consent: The procedure was performed in an emergent situation. Required items: required blood products, implants, devices, and special equipment available Patient identity confirmed: arm band and provided demographic data Time out: Immediately prior to procedure a "time out" was called to verify the correct  patient, procedure, equipment, support staff and site/side marked as required. Indications: vascular access Anesthesia: local infiltration Local anesthetic: lidocaine 1% with epinephrine Anesthetic total: 3 ml Patient sedated: no Preparation: skin prepped with 2% chlorhexidine Skin prep agent dried: skin prep agent completely dried prior to procedure Sterile barriers: all five maximum sterile barriers used - cap, mask, sterile gown, sterile gloves, and large sterile sheet Hand hygiene: hand hygiene performed prior to central venous catheter insertion  Location details: Right internal jugular   Catheter type: triple lumen Catheter size: 8 Fr Pre-procedure: landmarks identified Ultrasound guidance: Yes  Successful placement: yes Post-procedure: line sutured and dressing applied Assessment: blood return through all parts, free fluid flow, placement verified by x-ray and no pneumothorax on x-ray Patient tolerance: Patient tolerated the procedure well with no immediate complications.   Labs Review Labs Reviewed  URINALYSIS, ROUTINE W REFLEX MICROSCOPIC (NOT AT [facility]) - Abnormal; Notable for the following:    APPearance TURBID (*)    Hgb urine dipstick TRACE (*)    Bilirubin Urine SMALL (*)    Protein, ur 100 (*)    Leukocytes, UA MODERATE (*)    All other components within normal limits  COMPREHENSIVE METABOLIC PANEL - Abnormal; Notable for the following:    CO2 19 (*)    Glucose, Bld 138 (*)    Creatinine, Ser 1.66 (*)    Calcium 8.4 (*)    Albumin 3.3 (*)    ALT 13 (*)    GFR calc non Af Amer 42 (*)    GFR calc Af Amer 48 (*)    All other components within normal limits  CBC WITH DIFFERENTIAL/PLATELET - Abnormal; Notable for the following:    Lymphocytes Relative 11 (*)    Monocytes Relative 17 (*)    Lymphs Abs 0.4 (*)    All other components within normal limits  URINE MICROSCOPIC-ADD ON - Abnormal; Notable for the following:    Bacteria, UA MANY (*)    All other  components within normal limits  I-STAT CHEM 8, ED - Abnormal; Notable for the following:    BUN 23 (*)    Creatinine, Ser 1.60 (*)    Glucose, Bld 136 (*)    Calcium, Ion 1.07 (*)    All other components within normal limits  I-STAT ARTERIAL BLOOD GAS, ED - Abnormal; Notable for the following:    pH, Arterial 7.279 (*)    pCO2 arterial 48.5 (*)    pO2, Arterial 102.0 (*)    Acid-base deficit 4.0 (*)    All other components within normal limits  URINE CULTURE  CULTURE, BLOOD (ROUTINE X 2)  CULTURE, BLOOD (ROUTINE X 2)  CULTURE, EXPECTORATED SPUTUM-ASSESSMENT  CULTURE, RESPIRATORY (NON-EXPECTORATED)  CBC  CREATININE, SERUM  I-STAT CG4 LACTIC  ACID, ED  I-STAT TROPOININ, ED  I-STAT CG4 LACTIC ACID, ED    Imaging Review Dg Chest Portable 1 View  12/12/2014   CLINICAL DATA:  Endotracheal tube and central line placement.  EXAM: PORTABLE CHEST - 1 VIEW  COMPARISON:  None available for comparison at time of study interpretation.  FINDINGS: Endotracheal tube tip projects 4.2 cm above the carina. RIGHT internal jugular central venous catheter distal tip projects in mid superior vena cava. Nasogastric tube past the mid stomach, distal tip not imaged. The cardiac silhouette appears mildly enlarged. Mediastinal silhouette is nonsuspicious. Bronchitic changes, interstitial prominence in the lung bases with patchy bibasilar airspace opacities and small pleural effusions. No pneumothorax. Old RIGHT rib fractures. Possible acute LEFT seventh versus eighth rib fracture. Old LEFT third rib fracture.  IMPRESSION: Endotracheal tube tip projects 4.2 cm above the carina. RIGHT internal jugular central venous catheter distal tip projects in mid superior vena cava. Nasogastric tube past the mid stomach, distal tip not imaged. No pneumothorax.  Interstitial prominence confluent in the lung bases suggest pulmonary edema, with small pleural effusions.  Possibly acute LEFT lateral seventh versus eighth rib fracture.    Electronically Signed   By: Elon Alas M.D.   On: 12/12/2014 04:57   I have personally reviewed and evaluated these images and lab results as part of my medical decision-making.   EKG Interpretation   Date/Time:  Saturday December 12 2014 04:44:55 EDT Ventricular Rate:  133 PR Interval:  112 QRS Duration: 88 QT Interval:  285 QTC Calculation: 424 R Axis:   -55 Text Interpretation:  Sinus tachycardia Left axis deviation Anteroseptal  infarct, old Nonspecific T abnormalities, inferior leads Confirmed by Glynn Octave [phone]) on 12/12/2014 5:15:39 AM      MDM   Final diagnoses:  None   CRITICAL CARE Performed by: Everlene Balls, MD Total critical care time: 45min - sepsis Critical care time was exclusive of separately billable procedures and treating other patients. Critical care was necessary to treat or prevent imminent or life-threatening deterioration. Critical care was time spent personally by me on the following activities: development of treatment plan with patient and/or surrogate as well as nursing, discussions with consultants, evaluation of patient's response to treatment, examination of patient, obtaining history from patient or surrogate, ordering and performing treatments and interventions, ordering and review of laboratory studies, ordering and review of radiographic studies, pulse oximetry and re-evaluation of patient's condition.   Patient presents to emergency department for altered mental status and respiratory failure. Oxygen saturation was initially in the 70s. Per EMS, the nursing home states he is currently being treated for pneumonia. Lung sounds reveals possible aspiration. Also while I was intubating the patient, I saw copious amounts of vomitus in his posterior airway that was suctioned out. He likely has aspiration pneumonitis currently. IV access was difficult to obtain. I established to intraosseous access in his bilateral legs. He received IV  fluids and basal pressors. Sterile central line was placed in the right IJ. He continued to give fluids, vancomycin, Zosyn. Blood cultures were drawn before antibodies given.  Patient was ordered rectal Tylenol for fever. He continues to be tachycardic in emergency department to the 120s. Blood pressure has improved to 130/80, and norepinephrine drip has been decreased from 4-2. I spoke with critical care who will evaluate the patient for admission. Urinalysis reveals a UTI. Chest x-ray shows pulmonary edema with effusions.  Will also obtain Ct head on the way up to the ICU.  I personally performed the services described in this documentation, which was scribed in my presence. The recorded information has been reviewed and is accurate.   Everlene Balls, MD 12/12/14 [id number]  Everlene Balls, MD 12/12/14 [phone]

## 2014-12-13 ENCOUNTER — Inpatient Hospital Stay (HOSPITAL_COMMUNITY): Payer: Medicare Other

## 2014-12-13 DIAGNOSIS — J9601 Acute respiratory failure with hypoxia: Secondary | ICD-10-CM

## 2014-12-13 DIAGNOSIS — J690 Pneumonitis due to inhalation of food and vomit: Secondary | ICD-10-CM

## 2014-12-13 DIAGNOSIS — G9340 Encephalopathy, unspecified: Secondary | ICD-10-CM

## 2014-12-13 DIAGNOSIS — E440 Moderate protein-calorie malnutrition: Secondary | ICD-10-CM

## 2014-12-13 LAB — CBC
HCT: 32.9 % — ABNORMAL LOW (ref 39.0–52.0)
HEMOGLOBIN: 11.1 g/dL — AB (ref 13.0–17.0)
MCH: 29.9 pg (ref 26.0–34.0)
MCHC: 33.7 g/dL (ref 30.0–36.0)
MCV: 88.7 fL (ref 78.0–100.0)
Platelets: 200 10*3/uL (ref 150–400)
RBC: 3.71 MIL/uL — AB (ref 4.22–5.81)
RDW: 14.2 % (ref 11.5–15.5)
WBC: 5 10*3/uL (ref 4.0–10.5)

## 2014-12-13 LAB — PHOSPHORUS: Phosphorus: <1 mg/dL — CL (ref 2.5–4.6)

## 2014-12-13 LAB — POCT I-STAT 3, ART BLOOD GAS (G3+)
Acid-base deficit: 1 mmol/L (ref 0.0–2.0)
BICARBONATE: 21.5 meq/L (ref 20.0–24.0)
O2 Saturation: 100 %
PO2 ART: 157 mmHg — AB (ref 80.0–100.0)
TCO2: 22 mmol/L (ref 0–100)
pCO2 arterial: 29.3 mmHg — ABNORMAL LOW (ref 35.0–45.0)
pH, Arterial: 7.473 — ABNORMAL HIGH (ref 7.350–7.450)

## 2014-12-13 LAB — MAGNESIUM: MAGNESIUM: 1.5 mg/dL — AB (ref 1.7–2.4)

## 2014-12-13 LAB — BASIC METABOLIC PANEL
Anion gap: 7 (ref 5–15)
BUN: 21 mg/dL — AB (ref 6–20)
CALCIUM: 7.8 mg/dL — AB (ref 8.9–10.3)
CHLORIDE: 110 mmol/L (ref 101–111)
CO2: 24 mmol/L (ref 22–32)
CREATININE: 1.27 mg/dL — AB (ref 0.61–1.24)
GFR calc Af Amer: >60 mL/min (ref >60)
GFR calc non Af Amer: 58 mL/min — ABNORMAL LOW (ref >60)
GLUCOSE: 92 mg/dL (ref 65–99)
Potassium: 3.3 mmol/L — ABNORMAL LOW (ref 3.5–5.1)
Sodium: 141 mmol/L (ref 135–145)

## 2014-12-13 LAB — GLUCOSE, CAPILLARY
GLUCOSE-CAPILLARY: 91 mg/dL (ref 65–99)
GLUCOSE-CAPILLARY: 102 mg/dL — AB (ref 65–99)
GLUCOSE-CAPILLARY: 103 mg/dL — AB (ref 65–99)
Glucose-Capillary: 135 mg/dL — ABNORMAL HIGH (ref 65–99)

## 2014-12-13 MED ORDER — POTASSIUM PHOSPHATES 15 MMOLE/5ML IV SOLN
20.0000 mmol | Freq: Once | INTRAVENOUS | Status: AC
  Administered 2014-12-13: 20 mmol via INTRAVENOUS
  Filled 2014-12-13: qty 6.67

## 2014-12-13 MED ORDER — VITAL HIGH PROTEIN PO LIQD
1000.0000 mL | ORAL | Status: DC
  Administered 2014-12-13: 1000 mL
  Administered 2014-12-14: 06:00:00
  Filled 2014-12-13 (×3): qty 1000

## 2014-12-13 MED ORDER — MAGNESIUM SULFATE 2 GM/50ML IV SOLN
2.0000 g | Freq: Once | INTRAVENOUS | Status: AC
  Administered 2014-12-13: 2 g via INTRAVENOUS
  Filled 2014-12-13: qty 50

## 2014-12-13 MED ORDER — VALPROIC ACID 250 MG/5ML PO SYRP
125.0000 mg | ORAL_SOLUTION | Freq: Two times a day (BID) | ORAL | Status: DC
  Administered 2014-12-13 – 2014-12-15 (×6): 125 mg
  Filled 2014-12-13 (×8): qty 2.5

## 2014-12-13 MED ORDER — SERTRALINE HCL 50 MG PO TABS
150.0000 mg | ORAL_TABLET | Freq: Every day | ORAL | Status: DC
  Administered 2014-12-13 – 2014-12-15 (×3): 150 mg
  Filled 2014-12-13 (×5): qty 1

## 2014-12-13 MED ORDER — SODIUM CHLORIDE 0.9 % IV SOLN: INTRAVENOUS | Status: DC

## 2014-12-13 MED ORDER — PRO-STAT SUGAR FREE PO LIQD
30.0000 mL | Freq: Two times a day (BID) | ORAL | Status: DC
  Administered 2014-12-13 – 2014-12-14 (×3): 30 mL
  Filled 2014-12-13 (×4): qty 30

## 2014-12-13 MED ORDER — BENZTROPINE MESYLATE 1 MG PO TABS
1.0000 mg | ORAL_TABLET | Freq: Two times a day (BID) | ORAL | Status: DC
  Administered 2014-12-13 – 2014-12-15 (×6): 1 mg
  Filled 2014-12-13 (×8): qty 1

## 2014-12-13 MED ORDER — POTASSIUM CHLORIDE 10 MEQ/50ML IV SOLN
10.0000 meq | INTRAVENOUS | Status: AC
  Administered 2014-12-13 (×4): 10 meq via INTRAVENOUS
  Filled 2014-12-13 (×4): qty 50

## 2014-12-13 MED ORDER — CLINDAMYCIN PHOSPHATE 900 MG/50ML IV SOLN
900.0000 mg | Freq: Three times a day (TID) | INTRAVENOUS | Status: DC
  Administered 2014-12-14 (×2): 900 mg via INTRAVENOUS
  Filled 2014-12-13 (×4): qty 50

## 2014-12-13 NOTE — Progress Notes (Signed)
CRITICAL VALUE ALERT  Critical value received:  Phosphorus < 1.0, Magnesium 1.5  Date of notification:  12/13/14  Time of notification:  1806  Critical value read back: yes  Nurse who received alert:  Regino Schultze RN  MD notified (1st page):  Dr. Tamala JulianWarren Lacy  Time of first page:  1807  MD notified (2nd page):  Time of second page:  Responding MD:  Dr. Tamala Julian  Time MD responded:  [phone]

## 2014-12-13 NOTE — Progress Notes (Signed)
[facility] Progress Note Patient Name: Kevin Humphrey DOB: 1950-06-20 MRN: [id number]   Date of Service  12/13/2014  HPI/Events of Note  cvc site appears infected.  IV team unable to get IV and wont place PICC according to nurse. Low phos and magnesium  eICU Interventions  May need CVC Electrolytes replaced     Intervention Category Major Interventions: Infection - evaluation and management  Mauri Brooklyn, P 12/13/2014, 6:07 PM

## 2014-12-13 NOTE — Progress Notes (Signed)
PULMONARY / CRITICAL CARE MEDICINE HISTORY AND PHYSICAL EXAMINATION   Name: Kevin Humphrey MRN: [id number] DOB: 02/17/1951    ADMISSION DATE:  12/12/2014  PRIMARY SERVICE: PCCM  CHIEF COMPLAINT:  AMS  BRIEF PATIENT DESCRIPTION: 64 y/o man [place] w/ hx of schizophrenia, prior PNA, p/w AMS found to have severe sepsis of pulmonary origin with respiratory failure.  SIGNIFICANT EVENTS / STUDIES:  Intubated in ED CT head 8/20 >neg   LINES / TUBES: CVC RIJ (placed by ED) - 12/12/14  CULTURES: Blood - 12/12/14 - in process Lower resp Cx - 12/12/14 - NOT YET COLLECTED  ANTIBIOTICS: Pip-Tazo: 12/12/14 --> Vanc: 12/12/14 -->   SUBJECTIVE:  Weaned off pressors overnight.  Sedated on vent , awakens but does not follow commands   VITAL SIGNS: Temp:  [97.6 F (36.4 C)-101.9 F (38.8 C)] 100.6 F (38.1 C) (08/21 0530) Pulse Rate:  [66-151] 98 (08/21 0530) Resp:  [21-35] 35 (08/21 0530) BP: (78-213)/(48-96) 97/60 mmHg (08/21 0530) SpO2:  [94 %-100 %] 98 % (08/21 0530) FiO2 (%):  [40 %-60 %] 40 % (08/21 0400) Weight:  [194 lb 10.7 oz (88.3 kg)] 194 lb 10.7 oz (88.3 kg) (08/21 0430) HEMODYNAMICS:   VENTILATOR SETTINGS: Vent Mode:  [-] PRVC FiO2 (%):  [40 %-60 %] 40 % Set Rate:  [35 bmp] 35 bmp Vt Set:  [450 mL] 450 mL PEEP:  [8 cmH20-10 cmH20] 8 cmH20 Plateau Pressure:  [18 cmH20-25 cmH20] 18 cmH20 INTAKE / OUTPUT: Intake/Output      08/20 0701 - 08/21 0700 08/21 0701 - 08/22 0700   I.V. (mL/kg) 1742.2 (19.7)    NG/GT 20    IV Piggyback 400    Total Intake(mL/kg) 2162.2 (24.5)    Urine (mL/kg/hr) 785 (0.4)    Total Output 785     Net +1377.2            PHYSICAL EXAMINATION: General:  Very pale man appearing stated age, intubated and sedated. Neuro:  Sedated, awakens but does not follow commands HEENT: ETT  Neck: No JVD  Cardiovascular:  Tachycardic, no M/R/G Lungs:  Crackles heard with mechanical breath sounds. Copious thick secretions. Abdomen:  Soft, BS  + Musculoskeletal:  No deformities. Skin:  No rashes.  LABS:  CBC  Recent Labs Lab 12/12/14 0415 12/12/14 0422  WBC 4.0  --   HGB 14.6 16.3  HCT 45.2 48.0  PLT 264  --    Coag's No results for input(s): APTT, INR in the last 168 hours. BMET  Recent Labs Lab 12/12/14 0415 12/12/14 0422 12/13/14 0514  NA 139 142 141  K 4.3 3.9 3.3*  CL 106 107 110  CO2 19*  --  24  BUN 18 23* 21*  CREATININE 1.66* 1.60* 1.27*  GLUCOSE 138* 136* 92   Electrolytes  Recent Labs Lab 12/12/14 0415 12/13/14 0514  CALCIUM 8.4* 7.8*   Sepsis Markers  Recent Labs Lab 12/12/14 0423  LATICACIDVEN 1.26   ABG  Recent Labs Lab 12/12/14 0650 12/12/14 1725 12/13/14 0345  PHART 7.305* 7.396 7.473*  PCO2ART 37.1 29.7* 29.3*  PO2ART 110.0* 61.0* 157.0*   Liver Enzymes  Recent Labs Lab 12/12/14 0415  AST 32  ALT 13*  ALKPHOS 54  BILITOT 1.0  ALBUMIN 3.3*   Cardiac Enzymes No results for input(s): TROPONINI, PROBNP in the last 168 hours. Glucose  Recent Labs Lab 12/12/14 2037  GLUCAP 136*    Imaging Ct Head Wo Contrast  12/12/2014   CLINICAL DATA:  Nursing  home patient, found unresponsive, not responding to stimuli.  EXAM: CT HEAD WITHOUT CONTRAST  TECHNIQUE: Contiguous axial images were obtained from the base of the skull through the vertex without intravenous contrast.  COMPARISON:  None.  FINDINGS: The ventricles and sulci are normal for age. No intraparenchymal hemorrhage, mass effect nor midline shift. Patchy supratentorial white matter hypodensities are within normal range for patient's age and though non-specific suggest sequelae of chronic small vessel ischemic disease. No acute large vascular territory infarcts.  No abnormal extra-axial fluid collections. Basal cisterns are patent. Mild calcific atherosclerosis of the carotid siphons and included vertebral arteries.  No skull fracture. The included ocular globes and orbital contents are non-suspicious. Soft tissue  nearly completely opacifies the LEFT maxillary sinus with bony wall thickening consistent with acute on chronic sinusitis. Mild ethmoid mucosal thickening. The mastoid air cells are well aerated. Life support lines in place. The patient is edentulous.  IMPRESSION: Negative CT head.   Electronically Signed   By: Elon Alas M.D.   On: 12/12/2014 06:28   Dg Chest Portable 1 View  12/12/2014   CLINICAL DATA:  Endotracheal tube and central line placement.  EXAM: PORTABLE CHEST - 1 VIEW  COMPARISON:  None available for comparison at time of study interpretation.  FINDINGS: Endotracheal tube tip projects 4.2 cm above the carina. RIGHT internal jugular central venous catheter distal tip projects in mid superior vena cava. Nasogastric tube past the mid stomach, distal tip not imaged. The cardiac silhouette appears mildly enlarged. Mediastinal silhouette is nonsuspicious. Bronchitic changes, interstitial prominence in the lung bases with patchy bibasilar airspace opacities and small pleural effusions. No pneumothorax. Old RIGHT rib fractures. Possible acute LEFT seventh versus eighth rib fracture. Old LEFT third rib fracture.  IMPRESSION: Endotracheal tube tip projects 4.2 cm above the carina. RIGHT internal jugular central venous catheter distal tip projects in mid superior vena cava. Nasogastric tube past the mid stomach, distal tip not imaged. No pneumothorax.  Interstitial prominence confluent in the lung bases suggest pulmonary edema, with small pleural effusions.  Possibly acute LEFT lateral seventh versus eighth rib fracture.   Electronically Signed   By: Elon Alas M.D.   On: 12/12/2014 04:57    EKG: Sinus tach CXR: Air bronchograms in the R lower lung field suggestive of PNA vs aspiration.   ASSESSMENT / PLAN:  Active Problems:   Pneumonia   Malnutrition of moderate degree   Pressure ulcer   PULMONARY A: Sepsis of pulmonary origin Possible aspiration Hypoxia Acute Resp Failure  P:    Vent support  Eval for weaning  Daily  WUA/SBT ABX per ID sxn   CARDIOVASCULAR A: Septic Shock>improved , weaned off pressors 8/21 early am  HTN -home meds on hold with clonidine .  P:   Monitor b/p , keep MAP >65    RENAL A: AKI >improving with fluid resuscitation , scr tr down  Hypokalemia  P:   Cont to monitor  Replace K+    GASTROINTESTINAL A: Hx of hyperammoniemia due to Depakote P:   PPI  If not extubated, begin TF   HEMATOLOGIC A: No acute process noted   P:   Monitor  Hep for DVT prophylaxis   INFECTIOUS A: Sepsis of pulmonary origin, health-care associated ?Aspiration  P:   8/20 Ceftazidime  8/20 Vanc   8/20 BC > 8/20 Sputum cx > 8/10 UCx >   ENDOCRINE A: No known issues P:   Routine blood glucose checks. Add SSI if BS >180  NEUROLOGIC A: Hx of schizophrenia Need for sedation. P:   Holding home meds, cogentin, klonopin, depakote, resperdal , zoloft, nuedexta  Consider restarting slowly  Fentanyl infusion  BEST PRACTICE / DISPOSITION Level of Care:  ICU Primary Service:  PCCM Consultants:  None Code Status:  Full Diet:  NPO DVT Px:  Heparin GI Px:  PPI  Skin Integrity:  Intact Social / Family: non avaiblable   TODAY'S SUMMARY: 64 y/o man with sepsis of pulmonary origin., , evaluate for weaning .     Sundee Garland NP-C  [facility]  [phone]    12/13/2014, 7:25 AM

## 2014-12-13 NOTE — Progress Notes (Signed)
Received call from Regino Schultze RN regarding critical lab value blood cultures Gm + rods_ and notified Dr Zack Seal.

## 2014-12-13 NOTE — Progress Notes (Addendum)
[facility] Progress Note Patient Name: Kevin Humphrey DOB: 11-19-1950 MRN: [id number]   Date of Service  12/13/2014  HPI/Events of Note  Called d/t blood cultures from 12/12/2014 positive for GPR's. Patient is currently on Ceftazidime.  eICU Interventions  Will add Clindamycin 900 mg IV now and Q 8 hours empirically for GPR coverage until organism ID and sensitivities are available.     Intervention Category Major Interventions: Infection - evaluation and management  Sommer,Steven Eugene 12/13/2014, 11:37 PM

## 2014-12-13 NOTE — Progress Notes (Signed)
CRITICAL VALUE ALERT  Critical value received:  Anaerobic gram + rods   Date of notification:  12/13/14  Time of notification:  7703  Critical value read back:yes  Nurse who received alert: Regino Schultze RN   MD notified (1st page):  Elink notified at 1739  Time of first page:  1739  MD notified (2nd page):  Time of second page:  Responding MD:  Dr. Tamala Julian   Time MD responded:  [phone]

## 2014-12-13 NOTE — Progress Notes (Signed)
Brief Nutrition Note  Consult received for enteral/tube feeding initiation and management.  Adult Enteral Nutrition Protocol initiated. RD to follow-up 8/22.  Admitting Dx: Sepsis, due to unspecified organism [A41.9]  Body mass index is 27.16 kg/(m^2). Pt meets criteria for overweight based on current BMI.  Labs:   Recent Labs Lab 12/12/14 0415 12/12/14 0422 12/13/14 0514  NA 139 142 141  K 4.3 3.9 3.3*  CL 106 107 110  CO2 19*  --  24  BUN 18 23* 21*  CREATININE 1.66* 1.60* 1.27*  CALCIUM 8.4*  --  7.8*  GLUCOSE 138* 136* 92    Kevin Bibles, MS, [place], LDN Pager: [phone] After Hours Pager: [phone]

## 2014-12-13 NOTE — Progress Notes (Signed)
Questionable seizure activity this AM. Pt HR increased to 423 with systolic blood pressure 953U. All extremities exhibiting tremors. Eyes rolling upward symmetrically. Pt biting down on ET tube to the point that lips turned blue. Not able to follow commands before or during event. 2mg  versed administered. HR decreased to 130s, SBP decreased to 165. Will continue to monitor.

## 2014-12-13 NOTE — Progress Notes (Deleted)
[place] Progress Note Patient Name: Kevin Humphrey DOB: March 17, 1951 MRN: [id number]   Date of Service  12/13/2014  HPI/Events of Note  hypotension  eICU Interventions  Fluid bolus ordered     Intervention Category Intermediate Interventions: Hypotension - evaluation and management  Mauri [place], P 12/13/2014, 9:26 PM

## 2014-12-13 NOTE — Progress Notes (Signed)
Utilization Review Completed.Kevin Humphrey T8/21/2016  

## 2014-12-13 NOTE — Progress Notes (Signed)
CRITICAL VALUE ALERT  Critical value received:  Aerobic gram positive rods  Date of notification:  12/13/2014  Time of notification:  2323  Critical value read back:Yes.    Nurse who received alert:  Corinda Gubler  MD notified (1st page):  Warren Lacy MD  Time of first page:  2323  Responding MD:  Dr. Oletta Darter  Time MD responded:  [phone]

## 2014-12-14 ENCOUNTER — Inpatient Hospital Stay (HOSPITAL_COMMUNITY): Payer: Medicare Other

## 2014-12-14 DIAGNOSIS — J9690 Respiratory failure, unspecified, unspecified whether with hypoxia or hypercapnia: Secondary | ICD-10-CM | POA: Diagnosis present

## 2014-12-14 DIAGNOSIS — A419 Sepsis, unspecified organism: Principal | ICD-10-CM

## 2014-12-14 DIAGNOSIS — J1520 Pneumonia due to staphylococcus, unspecified: Secondary | ICD-10-CM

## 2014-12-14 DIAGNOSIS — N179 Acute kidney failure, unspecified: Secondary | ICD-10-CM

## 2014-12-14 DIAGNOSIS — J9600 Acute respiratory failure, unspecified whether with hypoxia or hypercapnia: Secondary | ICD-10-CM

## 2014-12-14 DIAGNOSIS — J9601 Acute respiratory failure with hypoxia: Secondary | ICD-10-CM | POA: Diagnosis present

## 2014-12-14 DIAGNOSIS — R6521 Severe sepsis with septic shock: Secondary | ICD-10-CM

## 2014-12-14 LAB — CBC
HEMATOCRIT: 34 % — AB (ref 39.0–52.0)
Hemoglobin: 11.3 g/dL — ABNORMAL LOW (ref 13.0–17.0)
MCH: 29.5 pg (ref 26.0–34.0)
MCHC: 33.2 g/dL (ref 30.0–36.0)
MCV: 88.8 fL (ref 78.0–100.0)
PLATELETS: 202 10*3/uL (ref 150–400)
RBC: 3.83 MIL/uL — ABNORMAL LOW (ref 4.22–5.81)
RDW: 15 % (ref 11.5–15.5)
WBC: 7 10*3/uL (ref 4.0–10.5)

## 2014-12-14 LAB — COMPREHENSIVE METABOLIC PANEL
ALT: 42 U/L (ref 17–63)
ANION GAP: 6 (ref 5–15)
AST: 28 U/L (ref 15–41)
Albumin: 2.3 g/dL — ABNORMAL LOW (ref 3.5–5.0)
Alkaline Phosphatase: 61 U/L (ref 38–126)
BILIRUBIN TOTAL: 0.5 mg/dL (ref 0.3–1.2)
BUN: 22 mg/dL — AB (ref 6–20)
CHLORIDE: 110 mmol/L (ref 101–111)
CO2: 25 mmol/L (ref 22–32)
Calcium: 8.1 mg/dL — ABNORMAL LOW (ref 8.9–10.3)
Creatinine, Ser: 0.9 mg/dL (ref 0.61–1.24)
GFR calc Af Amer: >60 mL/min (ref >60)
Glucose, Bld: 111 mg/dL — ABNORMAL HIGH (ref 65–99)
POTASSIUM: 3.2 mmol/L — AB (ref 3.5–5.1)
Sodium: 141 mmol/L (ref 135–145)
TOTAL PROTEIN: 6 g/dL — AB (ref 6.5–8.1)

## 2014-12-14 LAB — MAGNESIUM
MAGNESIUM: 1.9 mg/dL (ref 1.7–2.4)
MAGNESIUM: 2.1 mg/dL (ref 1.7–2.4)
MAGNESIUM: 2.3 mg/dL (ref 1.7–2.4)
MAGNESIUM: 2.3 mg/dL (ref 1.7–2.4)

## 2014-12-14 LAB — GLUCOSE, CAPILLARY
GLUCOSE-CAPILLARY: 122 mg/dL — AB (ref 65–99)
Glucose-Capillary: 102 mg/dL — ABNORMAL HIGH (ref 65–99)
Glucose-Capillary: 129 mg/dL — ABNORMAL HIGH (ref 65–99)
Glucose-Capillary: 135 mg/dL — ABNORMAL HIGH (ref 65–99)

## 2014-12-14 LAB — PHOSPHORUS
PHOSPHORUS: 2.4 mg/dL — AB (ref 2.5–4.6)
PHOSPHORUS: 2.6 mg/dL (ref 2.5–4.6)
Phosphorus: 1.8 mg/dL — ABNORMAL LOW (ref 2.5–4.6)
Phosphorus: 3.5 mg/dL (ref 2.5–4.6)

## 2014-12-14 LAB — URINE CULTURE: Special Requests: NORMAL

## 2014-12-14 MED ORDER — VANCOMYCIN HCL IN DEXTROSE 1-5 GM/200ML-% IV SOLN
1000.0000 mg | Freq: Three times a day (TID) | INTRAVENOUS | Status: DC
  Administered 2014-12-14 – 2014-12-16 (×5): 1000 mg via INTRAVENOUS
  Filled 2014-12-14 (×7): qty 200

## 2014-12-14 MED ORDER — POTASSIUM PHOSPHATES 15 MMOLE/5ML IV SOLN
30.0000 mmol | Freq: Once | INTRAVENOUS | Status: AC
  Administered 2014-12-14: 30 mmol via INTRAVENOUS
  Filled 2014-12-14: qty 10

## 2014-12-14 MED ORDER — FENTANYL CITRATE (PF) 100 MCG/2ML IJ SOLN
50.0000 ug | INTRAMUSCULAR | Status: DC | PRN
  Administered 2014-12-14 – 2014-12-15 (×9): 100 ug via INTRAVENOUS
  Filled 2014-12-14 (×9): qty 2

## 2014-12-14 MED ORDER — MUPIROCIN 2 % EX OINT
1.0000 "application " | TOPICAL_OINTMENT | Freq: Two times a day (BID) | CUTANEOUS | Status: AC
  Administered 2014-12-14 – 2014-12-18 (×10): 1 via NASAL
  Filled 2014-12-14: qty 22

## 2014-12-14 MED ORDER — CHLORHEXIDINE GLUCONATE CLOTH 2 % EX PADS
6.0000 | MEDICATED_PAD | Freq: Every day | CUTANEOUS | Status: AC
  Administered 2014-12-14 – 2014-12-18 (×5): 6 via TOPICAL

## 2014-12-14 MED ORDER — POTASSIUM CHLORIDE 20 MEQ/15ML (10%) PO SOLN
30.0000 meq | ORAL | Status: AC
  Administered 2014-12-14 (×2): 30 meq
  Filled 2014-12-14 (×2): qty 30

## 2014-12-14 MED ORDER — SODIUM CHLORIDE 0.9 % IV SOLN
1750.0000 mg | Freq: Once | INTRAVENOUS | Status: AC
  Administered 2014-12-14: 1750 mg via INTRAVENOUS
  Filled 2014-12-14: qty 1750

## 2014-12-14 MED ORDER — VITAL AF 1.2 CAL PO LIQD
1000.0000 mL | ORAL | Status: DC
  Administered 2014-12-14 – 2014-12-15 (×2): 1000 mL
  Filled 2014-12-14 (×10): qty 1000

## 2014-12-14 NOTE — Progress Notes (Signed)
ANTIBIOTIC CONSULT NOTE - INITIAL  Pharmacy Consult for Vancomycin  Indication: pneumonia  No Known Allergies  Patient Measurements: Height: 5\' 11"  (180.3 cm) Weight: 196 lb 6.9 oz (89.1 kg) IBW/kg (Calculated) : 75.3  Vital Signs: Temp: 100.1 F (37.8 C) (08/22 1300) Temp Source: Oral (08/22 0749) BP: 108/94 mmHg (08/22 1300) Pulse Rate: 109 (08/22 1300) Intake/Output from previous day: 08/21 0701 - 08/22 0700 In: 1700 [I.V.:775; NG/GT:475; IV Piggyback:450] Out: 1175 [Urine:1175] Intake/Output from this shift: Total I/O In: 387.3 [I.V.:117.3; NG/GT:270] Out: 340 [Urine:340]  Labs:  Recent Labs  12/12/14 0415 12/12/14 0422 12/13/14 0514 12/13/14 0846 12/14/14 0446  WBC 4.0  --   --  5.0 7.0  HGB 14.6 16.3  --  11.1* 11.3*  PLT 264  --   --  200 202  CREATININE 1.66* 1.60* 1.27*  --  0.90   Estimated Creatinine Clearance: 87.2 mL/min (by C-G formula based on Cr of 0.9). No results for input(s): VANCOTROUGH, VANCOPEAK, VANCORANDOM, GENTTROUGH, GENTPEAK, GENTRANDOM, TOBRATROUGH, TOBRAPEAK, TOBRARND, AMIKACINPEAK, AMIKACINTROU, AMIKACIN in the last 72 hours.   Microbiology: Recent Results (from the past 720 hour(s))  Blood Culture (routine x 2)     Status: None (Preliminary result)   Collection Time: 12/12/14  4:10 AM  Result Value Ref Range Status   Specimen Description BLOOD LEFT ANTECUBITAL  Final   Special Requests BOTTLES DRAWN AEROBIC AND ANAEROBIC 5CCS  Final   Culture  Setup Time   Final    GRAM POSITIVE RODS IN ANAEROBIC BOTTLE CRITICAL RESULT CALLED TO, READ BACK BY AND VERIFIED WITH: RN ROBIN,L AT [id number] MARTINB CONFIRMED BY K Marks    Culture   Final    GRAM POSITIVE RODS CULTURE REINCUBATED FOR BETTER GROWTH    Report Status PENDING  Incomplete  Blood Culture (routine x 2)     Status: None (Preliminary result)   Collection Time: 12/12/14  4:10 AM  Result Value Ref Range Status   Specimen Description BLOOD LEFT FEMORAL ARTERY  Final   Special Requests AEB  5CC   Final   Culture  Setup Time   Final    GRAM POSITIVE RODS AEROBIC BOTTLE ONLY CRITICAL RESULT CALLED TO, READ BACK BY AND VERIFIED WITH: Levada Dy RN [id number] GREEN R CONFIRMED BY M. CAMPBELL     Culture NO GROWTH 2 DAYS  Final   Report Status PENDING  Incomplete  Urine culture     Status: None (Preliminary result)   Collection Time: 12/12/14  4:50 AM  Result Value Ref Range Status   Specimen Description URINE, CATHETERIZED  Final   Special Requests Normal  Final   Culture TOO YOUNG TO READ  Final   Report Status PENDING  Incomplete  MRSA PCR Screening     Status: Abnormal   Collection Time: 12/12/14  6:37 AM  Result Value Ref Range Status   MRSA by PCR POSITIVE (A) NEGATIVE Final    Comment: RESULT CALLED TO, READ BACK BY AND VERIFIED WITH: A. BEACHY,RN AT 2025 ON [id number] BY K. PEELE        The GeneXpert MRSA Assay (FDA approved for NASAL specimens only), is one component of a comprehensive MRSA colonization surveillance program. It is not intended to diagnose MRSA infection nor to guide or monitor treatment for MRSA infections.   Culture, respiratory (NON-Expectorated)     Status: None (Preliminary result)   Collection Time: 12/12/14  6:41 AM  Result Value Ref Range Status   Specimen Description TRACHEAL ASPIRATE  Final   Special Requests NONE  Final   Gram Stain   Final    MODERATE WBC PRESENT, PREDOMINANTLY PMN NO SQUAMOUS EPITHELIAL CELLS SEEN ABUNDANT GRAM POSITIVE COCCI IN PAIRS IN CLUSTERS FEW GRAM NEGATIVE COCCI FEW GRAM NEGATIVE RODS    Culture   Final    MODERATE STAPHYLOCOCCUS AUREUS Note: RIFAMPIN AND GENTAMICIN SHOULD NOT BE USED AS SINGLE DRUGS FOR TREATMENT OF STAPH INFECTIONS. Performed at [facility]    Report Status PENDING  Incomplete    Medical History: Past Medical History  Diagnosis Date  . GERD (gastroesophageal reflux disease) 08/02/2012  . Dysphagia, unspecified 08/02/2012  . Allergic rhinitis  08/02/2012  . Vertebral compression fracture 08/02/2012  . Other and unspecified hyperlipidemia 08/02/2012  . Schizophrenia 08/02/2012  . Depression 08/02/2012    Medications:  Prescriptions prior to admission  Medication Sig Dispense Refill Last Dose  . benztropine (COGENTIN) 2 MG tablet Take 1 mg by mouth 2 (two) times daily.    12/11/2014 at Unknown time  . clonazePAM (KLONOPIN) 0.5 MG tablet Take one tablet by mouth twice daily for anxiety; Take two tablets by mouth every night at bedtime for anxiety; Take one tablet by mouth once daily as needed for breakthrough pain (per fax Rx) (Patient taking differently: Take 0.5-1 mg by mouth 3 (three) times daily. 0.5 mg at 0800 and 1200 and 1 mg at 2100.) 150 tablet 5 12/11/2014 at Unknown time  . cloNIDine (CATAPRES) 0.1 MG tablet Take 0.1 mg by mouth at bedtime.   12/11/2014 at Unknown time  . Dextromethorphan-Quinidine (NUEDEXTA) 20-10 MG CAPS Take 1 tablet by mouth 2 (two) times daily.   12/11/2014 at Unknown time  . divalproex (DEPAKOTE SPRINKLE) 125 MG capsule Take 125 mg by mouth 2 (two) times daily.   12/11/2014 at Unknown time  . famotidine (PEPCID) 20 MG tablet Take 20 mg by mouth daily.    12/11/2014 at Unknown time  . hydrochlorothiazide (HYDRODIURIL) 25 MG tablet Take 12.5 mg by mouth daily.   12/11/2014 at Unknown time  . lactose free nutrition (BOOST) LIQD Take 237 mLs by mouth 2 (two) times daily between meals.   12/11/2014 at Unknown time  . lactulose, encephalopathy, (ENULOSE) 10 GM/15ML SOLN Take 20 g by mouth 3 (three) times daily.   12/11/2014 at Unknown time  . Multiple Vitamin (MULTIVITAMIN) tablet Take 1 tablet by mouth daily.   12/11/2014 at Unknown time  . risperiDONE (RISPERDAL) 2 MG tablet Take 4 mg by mouth 2 (two) times daily. Takes 2 mg in the am and 4 mg in the pm   12/11/2014 at Unknown time  . sertraline (ZOLOFT) 100 MG tablet Take 150 mg by mouth daily.    12/11/2014 at Unknown time  . triamcinolone (NASACORT) 55 MCG/ACT nasal  inhaler Place 2 sprays into the nose 2 (two) times daily.   12/11/2014 at Unknown time  . [DISCONTINUED] atorvastatin (LIPITOR) 20 MG tablet Take 1 tablet (20 mg total) by mouth daily. (Patient not taking: Reported on 12/12/2014) 90 tablet 3 Not Taking at Unknown time  . [DISCONTINUED] LORazepam (ATIVAN) 0.5 MG tablet Take one tablet by mouth twice daily as needed for anxiety/agitation (Patient not taking: Reported on 12/12/2014) 60 tablet 5 Not Taking at Unknown time   Assessment: 64 y/o M from nursing home unresponsive/hypotensive/febrile, required intubation in the ED and is being treated for PNA. Tmax 101 (now 99), WBC 7, LA=1.26  Vancomycin 8/20 > 8/21, back on 8/22  Fortaz 8/20 > 8/22  Clindamycin 8/22 > 8/22   MRSA PCR +  Resp Cx 8/20 > Moderate staph, few GNR on stain  Urine cx 8/20 > pending  BCx x2 8/20 > G+ rods   Goal of Therapy:  Vancomycin trough level 15-20 mcg/ml  Plan:  Vancomycin 1750 mg IV x1, then 1000 mg IV q8h Trough at Css (prior to 0700 dose on 8/24 - not ordered) F/U C&S, renal fxn, clinical improvement  Governor Specking, PharmD Clinical Pharmacy Resident Pager: [phone] 12/14/2014,2:32 PM

## 2014-12-14 NOTE — Progress Notes (Signed)
Nutrition Follow-up  DOCUMENTATION CODES:   Non-severe (moderate) malnutrition in context of chronic illness  INTERVENTION:  Initiate TF via OGT with Vital AF 1.2 at 70 ml/h (1680 ml per day) to provide 2016 kcals, 126 gm protein, 1361 ml free water daily.   NUTRITION DIAGNOSIS:   Inadequate oral intake related to inability to eat as evidenced by NPO status.  Ongoing  GOAL:   Patient will meet greater than or equal to 90% of their needs  Unmet  MONITOR:   Vent status, Diet advancement, Skin, Labs, I & O's, TF tolerance  REASON FOR ASSESSMENT:   Consult Assessment of nutrition requirement/status  ASSESSMENT:   64 y/o man ([place] of state) w/ hx of schizophrenia, prior PNA, p/w AMS found to have severe sepsis of pulmonary origin with respiratory failure.  Pt remains on vent. RD consulted for TF management. TF's have already been initiated (8/21 at noon); pt is receiving Vital High protein @ 40 ml/hr via OGT which provides 960 kcal and 84 grams of protein. Pt is tolerating TF well. No BM.   Labs: low potassium, low calcium  Patient is currently intubated on ventilator support MV: 9.2 L/min Temp (24hrs), Avg:99.3 F (37.4 C), Min:98 F (36.7 C), Max:100.2 F (37.9 C)  Propofol: none   Diet Order:  Diet NPO time specified  Skin:  Wound (see comment) (stage I pressure ulcer on buttocks)  Last BM:  PTA  Height:   Ht Readings from Last 1 Encounters:  12/12/14 5\' 11"  (1.803 m)    Weight:   Wt Readings from Last 1 Encounters:  12/14/14 196 lb 6.9 oz (89.1 kg)    Ideal Body Weight:  78.18 kg  BMI:  Body mass index is 27.41 kg/(m^2).  Estimated Nutritional Needs:   Kcal:  2103  Protein:  125-145 grams  Fluid:  2.5 L/day  EDUCATION NEEDS:   No education needs identified at this time  [place], LDN Inpatient Clinical Dietitian Pager: [phone] After Hours Pager: [phone]

## 2014-12-14 NOTE — Progress Notes (Signed)
[facility] ADULT ICU REPLACEMENT PROTOCOL FOR AM LAB REPLACEMENT ONLY  The patient does apply for the [facility] Adult ICU Electrolyte Replacment Protocol based on the criteria listed below:   1. Is GFR >/= 40 ml/min? Yes.    Patient's GFR today is >60 2. Is urine output >/= 0.5 ml/kg/hr for the last 6 hours? Yes.   Patient's UOP is 0.7 ml/kg/hr 3. Is BUN < 60 mg/dL? Yes.    Patient's BUN today is 22 4. Abnormal electrolyte(s): K 3.2 5. Ordered repletion with: per protocol 6. If a panic level lab has been reported, has the CCM MD in charge been notified? No..   Physician:    Ronda Fairly A 12/14/2014 6:05 AM

## 2014-12-14 NOTE — Progress Notes (Addendum)
PULMONARY / CRITICAL CARE MEDICINE HISTORY AND PHYSICAL EXAMINATION   Name: Kevin Humphrey MRN: [id number] DOB: Oct 22, 1950    ADMISSION DATE:  12/12/2014  PRIMARY SERVICE: PCCM  CHIEF COMPLAINT:  AMS  BRIEF PATIENT DESCRIPTION: 64 y/o man ([place] of state) w/ hx of schizophrenia, prior PNA, p/w AMS found to have severe sepsis of pulmonary origin with respiratory failure.   LINES / TUBES: CVC RIJ (placed by ED) - 12/12/14  CULTURES: Blood - 12/12/14 - in process Lower resp Cx - 12/12/14 -  ANTIBIOTICS: Pip-Tazo: 12/12/14 --> Vanc: 12/12/14 -->   SIGNIFICANT EVENTS / STUDIES:  Intubated in ED CT head 8/20 >neg   12/13/14:  Weaned off pressors overnight.  Sedated on vent , awakens but does not follow commands     SUBJECTIVE/OVERNIGHT/INTERVAL HX 12/14/14: Still on vent. Off pressors since yesterday. Psych meds being restarted. On fent gtt -> RASS -2, follows some commands. Moves all 4s. Failed SBT x 2. MRSA PCR +  VITAL SIGNS: Temp:  [98 F (36.7 C)-100.2 F (37.9 C)] 100.1 F (37.8 C) (08/22 1300) Pulse Rate:  [65-116] 109 (08/22 1300) Resp:  [17-24] 19 (08/22 1300) BP: (91-166)/(54-94) 108/94 mmHg (08/22 1300) SpO2:  [90 %-100 %] 97 % (08/22 1300) FiO2 (%):  [40 %] 40 % (08/22 1200) Weight:  [89.1 kg (196 lb 6.9 oz)] 89.1 kg (196 lb 6.9 oz) (08/22 0439) HEMODYNAMICS:   VENTILATOR SETTINGS: Vent Mode:  [-] PRVC FiO2 (%):  [40 %] 40 % Set Rate:  [20 bmp] 20 bmp Vt Set:  [450 mL] 450 mL PEEP:  [5 cmH20] 5 cmH20 Plateau Pressure:  [16 cmH20-23 cmH20] 17 cmH20 INTAKE / OUTPUT: Intake/Output      08/21 0701 - 08/22 0700 08/22 0701 - 08/23 0700   I.V. (mL/kg) 775 (8.7) 117.3 (1.3)   NG/GT 475 270   IV Piggyback 450    Total Intake(mL/kg) 1700 (19.1) 387.3 (4.3)   Urine (mL/kg/hr) 1175 (0.5) 340 (0.5)   Total Output 1175 340   Net +525 +47.3          PHYSICAL EXAMINATION: General:  Critically ill looking  man appearing stated age, intubated and sedated. Neuro:   RASS -2, follows simple commands, moves all 4s HEENT: ETT  Neck: No JVD  Cardiovascular:  Tachycardic, no M/R/G Lungs:  Crackles heard with mechanical breath sounds. thick secretions.+ improved Abdomen:  Soft, BS + Musculoskeletal:  No deformities. Skin:  No rashes.  LABS: PULMONARY  Recent Labs Lab 12/12/14 0422 12/12/14 0458 12/12/14 0650 12/12/14 1725 12/13/14 0345  PHART  --  7.279* 7.305* 7.396 7.473*  PCO2ART  --  48.5* 37.1 29.7* 29.3*  PO2ART  --  102.0* 110.0* 61.0* 157.0*  HCO3  --  22.5 18.2* 18.2* 21.5  TCO2 22 24 19 19 22   O2SAT  --  96.0 97.0 91.0 100.0    CBC  Recent Labs Lab 12/12/14 0415 12/12/14 0422 12/13/14 0846 12/14/14 0446  HGB 14.6 16.3 11.1* 11.3*  HCT 45.2 48.0 32.9* 34.0*  WBC 4.0  --  5.0 7.0  PLT 264  --  200 202    COAGULATION No results for input(s): INR in the last 168 hours.  CARDIAC  No results for input(s): TROPONINI in the last 168 hours. No results for input(s): PROBNP in the last 168 hours.   CHEMISTRY  Recent Labs Lab 12/12/14 0415 12/12/14 0422 12/13/14 0514 12/13/14 1230 12/13/14 2339 12/14/14 0446 12/14/14 1314  NA 139 142 141  --   --  141  --   K 4.3 3.9 3.3*  --   --  3.2*  --   CL 106 107 110  --   --  110  --   CO2 19*  --  24  --   --  25  --   GLUCOSE 138* 136* 92  --   --  111*  --   BUN 18 23* 21*  --   --  22*  --   CREATININE 1.66* 1.60* 1.27*  --   --  0.90  --   CALCIUM 8.4*  --  7.8*  --   --  8.1*  --   MG  --   --   --  1.5* 2.3 2.3 2.1  PHOS  --   --   --  <1.0* 2.4* 2.6 1.8*   Estimated Creatinine Clearance: 87.2 mL/min (by C-G formula based on Cr of 0.9).   LIVER  Recent Labs Lab 12/12/14 0415 12/14/14 0446  AST 32 28  ALT 13* 42  ALKPHOS 54 61  BILITOT 1.0 0.5  PROT 6.5 6.0*  ALBUMIN 3.3* 2.3*     INFECTIOUS  Recent Labs Lab 12/12/14 0423  LATICACIDVEN 1.26     ENDOCRINE CBG (last 3)   Recent Labs  12/14/14 0423 12/14/14 0744 12/14/14 1149  GLUCAP  102* 129* 122*         IMAGING x48h  - image(s) personally visualized  -   highlighted in bold Dg Chest Port 1 View  12/14/2014   CLINICAL DATA:  Pneumonia.  Shortness of breath.  EXAM: PORTABLE CHEST - 1 VIEW  COMPARISON:  12/13/2014.  FINDINGS: Endotracheal tube, NG tube, right IJ line stable position. Cardiomegaly. Progressive bibasilar atelectasis and/or infiltrates are noted with bilateral small pleural effusions. No pneumothorax.  IMPRESSION: 1. Lines and tubes in stable position. 2. Progressive bibasilar atelectasis and/or infiltrates. Small pleural effusions. 3. Stable cardiomegaly.   Electronically Signed   By: Marcello Moores  Register   On: 12/14/2014 07:08   Dg Chest Port 1 View  12/13/2014   CLINICAL DATA:  Acute respiratory failure. On ventilator.  EXAM: PORTABLE CHEST - 1 VIEW  COMPARISON:  12/12/2014  FINDINGS: Support lines and tubes in appropriate position. No pneumothorax identified. Bibasilar pulmonary infiltrates show improvement since previous study. Heart size remains stable. Old rib fracture deformities again noted.  IMPRESSION: Improving bibasilar infiltrates since prior exam.   Electronically Signed   By: Earle Gell M.D.   On: 12/13/2014 07:31         ASSESSMENT / PLAN:  Active Problems:   Pneumonia   Malnutrition of moderate degree   Pressure ulcer   PULMONARY A: Acute Resp Failure  Due to HCAP v Aspiration   - on 40% fio2. Mental status barrier to extubation  P:   Eval for weaning  But no extubation 12/14/2014 due to sedation gtt and mental status Full Vent Support Daily  WUA/SBT ABX per ID sxn   CARDIOVASCULAR A: Septic Shock>improved , weaned off pressors 8/21 early am  HTN -home meds on hold with clonidine .   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   - on tube feeds P:   PPI  Cont tube feeds  HEMATOLOGIC A: No acute process noted   P:   Monitor  Hep for DVT prophylaxis   INFECTIOUS Results for orders placed or performed during the hospital encounter of 12/12/14  Blood Culture (routine x 2)     Status: None (Preliminary result)   Collection Time: 12/12/14  4:10 AM  Result Value Ref Range Status   Specimen Description BLOOD LEFT ANTECUBITAL  Final   Special Requests BOTTLES DRAWN AEROBIC AND ANAEROBIC 5CCS  Final   Culture  Setup Time   Final    GRAM POSITIVE RODS IN ANAEROBIC BOTTLE CRITICAL RESULT CALLED TO, READ BACK BY AND VERIFIED WITH: RN ROBIN,L AT 58 [id number] MARTINB CONFIRMED BY K [place]    Culture   Final    GRAM POSITIVE RODS CULTURE REINCUBATED FOR BETTER GROWTH    Report Status PENDING  Incomplete  Blood Culture (routine x 2)     Status: None (Preliminary result)   Collection Time: 12/12/14  4:10 AM  Result Value Ref Range Status   Specimen Description BLOOD LEFT FEMORAL ARTERY  Final   Special Requests AEB  5CC   Final   Culture  Setup Time   Final    GRAM POSITIVE RODS AEROBIC BOTTLE ONLY CRITICAL RESULT CALLED TO, READ BACK BY AND VERIFIED WITH: Levada Dy RN [id number] GREEN R CONFIRMED BY [place]     Culture NO GROWTH 2 DAYS  Final   Report Status PENDING  Incomplete  Urine culture     Status: None (Preliminary result)   Collection Time: 12/12/14  4:50 AM  Result Value Ref Range Status   Specimen Description URINE, CATHETERIZED  Final   Special Requests Normal  Final   Culture TOO YOUNG TO READ  Final   Report Status PENDING  Incomplete  MRSA PCR Screening     Status: Abnormal   Collection Time: 12/12/14  6:37 AM  Result Value Ref Range Status   MRSA by PCR POSITIVE (A) NEGATIVE Final    Comment: RESULT CALLED TO, READ BACK BY AND VERIFIED WITH: A. BEACHY,RN AT [id number] ON [id number] BY K. PEELE        The GeneXpert MRSA Assay (FDA approved for NASAL  specimens only), is one component of a comprehensive MRSA colonization surveillance program. It is not intended to diagnose MRSA infection nor to guide or monitor treatment for MRSA infections.   Culture, respiratory (NON-Expectorated)     Status: None (Preliminary result)   Collection Time: 12/12/14  6:41 AM  Result Value Ref Range Status   Specimen Description TRACHEAL ASPIRATE  Final   Special Requests NONE  Final   Gram Stain   Final    MODERATE WBC PRESENT, PREDOMINANTLY PMN NO SQUAMOUS EPITHELIAL CELLS SEEN ABUNDANT GRAM POSITIVE COCCI IN PAIRS IN CLUSTERS FEW GRAM NEGATIVE COCCI FEW GRAM NEGATIVE RODS    Culture   Final    MODERATE STAPHYLOCOCCUS AUREUS Note: RIFAMPIN AND GENTAMICIN SHOULD NOT BE USED AS SINGLE DRUGS FOR TREATMENT OF STAPH INFECTIONS. Performed at [facility]    Report Status PENDING  Incomplete    A: Sepsis of pulmonary origin, health-care associated - likely MRSA pneumonia  - final sensitivities ?Aspiration  P:   8/20 Ceftazidime ?> 12/14/14 8/20 Vanc  > 8/21, 8/21 >> Clinda 12/13/14 >12/13/14       ENDOCRINE A: No known issues P:   Routine blood glucose checks. Add SSI if BS >180   NEUROLOGIC A: Hx of schizophrenia Need for sedation.   -  on fent gtt with RASS -2 P:   DC fent gtt Do fent prn Holding home meds, cogentin, klonopin, depakote, resperdal , zoloft, nuedexta - back on cogentin, zoloft and depakote as of 12/14/2014 Consider restarting slowly    DERM A: RN says no stage 1 ulcer because it blanches - 12/14/2014 P Monitor   BEST PRACTICE / DISPOSITION Level of Care:  ICU Primary Service:  PCCM Consultants:  None Code Status:  Full Diet:  NPO DVT Px:  Heparin GI Px:  PPI  Skin Integrity:  Intact Social / Family: non avaiblable .Ward of state visited him 12/14/2014 . Will try to talk to her    TODAY'S SUMMARY: 64 y/o man with sepsis of pulmonary origin. Likely MRSA PNA       The patient is critically  ill with multiple organ systems failure and requires high complexity decision making for assessment and support, frequent evaluation and titration of therapies, application of advanced monitoring technologies and extensive interpretation of multiple databases.   Critical Care Time devoted to patient care services described in this note is  35  Minutes. This time reflects time of care of this signee Dr Brand Males. This critical care time does not reflect procedure time, or teaching time or supervisory time of PA/NP/Med student/Med Resident etc but could involve care discussion time    Dr. Brand Males, M.D., [facility].C.P Pulmonary and Critical Care Medicine Staff Physician [place] [facility] Pager: [phone], If no answer or between  15:00h - 7:00h: call [phone]  12/14/2014 2:17 PM

## 2014-12-14 NOTE — Clinical Social Work Note (Signed)
CSW consult acknowledged:   Clinical Social Worker received consult indicating patient was admitted from facility. Patient is a ward of the state and admitted from SNF, [place].  CSW to complete psychosocial assessment.  Glendon Axe, MSW, LCSWA [phone] 12/14/2014 2:35 PM

## 2014-12-15 ENCOUNTER — Inpatient Hospital Stay (HOSPITAL_COMMUNITY): Payer: Medicare Other

## 2014-12-15 LAB — MAGNESIUM: MAGNESIUM: 1.8 mg/dL (ref 1.7–2.4)

## 2014-12-15 LAB — CBC WITH DIFFERENTIAL/PLATELET
BASOS ABS: 0.1 10*3/uL (ref 0.0–0.1)
BASOS PCT: 1 % (ref 0–1)
EOS ABS: 0.4 10*3/uL (ref 0.0–0.7)
Eosinophils Relative: 6 % — ABNORMAL HIGH (ref 0–5)
HCT: 29.8 % — ABNORMAL LOW (ref 39.0–52.0)
HEMOGLOBIN: 9.9 g/dL — AB (ref 13.0–17.0)
Lymphocytes Relative: 13 % (ref 12–46)
Lymphs Abs: 0.9 10*3/uL (ref 0.7–4.0)
MCH: 29.6 pg (ref 26.0–34.0)
MCHC: 33.2 g/dL (ref 30.0–36.0)
MCV: 89.2 fL (ref 78.0–100.0)
Monocytes Absolute: 1.1 10*3/uL — ABNORMAL HIGH (ref 0.1–1.0)
Monocytes Relative: 16 % — ABNORMAL HIGH (ref 3–12)
NEUTROS PCT: 64 % (ref 43–77)
Neutro Abs: 4.4 10*3/uL (ref 1.7–7.7)
Platelets: 196 10*3/uL (ref 150–400)
RBC: 3.34 MIL/uL — AB (ref 4.22–5.81)
RDW: 15.2 % (ref 11.5–15.5)
WBC: 6.8 10*3/uL (ref 4.0–10.5)

## 2014-12-15 LAB — BASIC METABOLIC PANEL
Anion gap: 5 (ref 5–15)
BUN: 17 mg/dL (ref 6–20)
CALCIUM: 7.8 mg/dL — AB (ref 8.9–10.3)
CO2: 25 mmol/L (ref 22–32)
Chloride: 110 mmol/L (ref 101–111)
Creatinine, Ser: 0.79 mg/dL (ref 0.61–1.24)
Glucose, Bld: 108 mg/dL — ABNORMAL HIGH (ref 65–99)
Potassium: 3.9 mmol/L (ref 3.5–5.1)
SODIUM: 140 mmol/L (ref 135–145)

## 2014-12-15 LAB — CULTURE, RESPIRATORY

## 2014-12-15 LAB — CULTURE, RESPIRATORY W GRAM STAIN

## 2014-12-15 LAB — GLUCOSE, CAPILLARY
GLUCOSE-CAPILLARY: 111 mg/dL — AB (ref 65–99)
GLUCOSE-CAPILLARY: 119 mg/dL — AB (ref 65–99)
GLUCOSE-CAPILLARY: 120 mg/dL — AB (ref 65–99)
GLUCOSE-CAPILLARY: 126 mg/dL — AB (ref 65–99)
Glucose-Capillary: 110 mg/dL — ABNORMAL HIGH (ref 65–99)
Glucose-Capillary: 143 mg/dL — ABNORMAL HIGH (ref 65–99)

## 2014-12-15 LAB — PHOSPHORUS: Phosphorus: 2.8 mg/dL (ref 2.5–4.6)

## 2014-12-15 MED ORDER — FUROSEMIDE 10 MG/ML IJ SOLN
20.0000 mg | Freq: Three times a day (TID) | INTRAMUSCULAR | Status: DC
  Administered 2014-12-15 – 2014-12-17 (×9): 20 mg via INTRAVENOUS
  Filled 2014-12-15 (×13): qty 2

## 2014-12-15 MED ORDER — POTASSIUM CHLORIDE 20 MEQ/15ML (10%) PO SOLN
20.0000 meq | Freq: Two times a day (BID) | ORAL | Status: DC
  Administered 2014-12-15 (×2): 20 meq
  Filled 2014-12-15 (×4): qty 15

## 2014-12-15 NOTE — Progress Notes (Signed)
Patient ID: Kevin Humphrey, male   DOB: Oct 05, 1950, 64 y.o.   MRN: [id number]                PROGRESS NOTE  DATE:  12/08/2014              FACILITY: [facility]                       LEVEL OF CARE:   SNF   Acute Visit                   CHIEF COMPLAINT:  Review of increasing lethargy.     HISTORY OF PRESENT ILLNESS:  I have reviewed and discussed this patient with the [facility] nurse practitioner.    This is a man with perhaps depression, and in some of our remote notes a history of seizures although I do not think that has been an issue since he came here.  He has also been followed by Psychiatry in the facility and apparently was put on Depakote for a mood disorder rather than as a seizure medication.    Some months ago, he developed some lethargy.  As part of the work-up for this, he was found to have an elevated ammonia level in the 150-200 range.  He was put on lactulose.  I should mention that he does not have a history of liver disease that any of [place] are aware of.  His liver function tests remain normal.  Amylase and lipase are normal.    I have discussed this in some detail.  I am not completely certain that his ammonia level has anything to do with his current symptomatology.  Nevertheless, as Depakote is a possible cause of this, I recommend to taper this to off.  As opposed to some of my earlier notes, I think he was on this for a mood issue, not as a medication for seizures.  His maximum dose of Depakote was 325 b.i.d.     PAST MEDICAL HISTORY/PROBLEM LIST:               Gastroesophageal reflux disease.      Dysphagia.    Allergic rhinitis.    Vertebral compression fractures.     Hyperlipidemia.    Schizophrenia.    Depression.     Gout.      Falls.      Seizures.      CURRENT MEDICATIONS:  Medication list is reviewed.              Multivitamin.    Hydrochlorothiazide 12.5 q.d.       Zoloft 150 q.d.       Allopurinol 100 q.d.     Pepcid 20 q.d.            Nasocort 2 sprays in each nostril twice daily.      Cogentin 1 mg twice daily.      Depakote 375 b.i.d.       Risperidal 4 b.i.d.        Nuedexta 20/10, 1 tablet twice daily.    Klonopin 0.5 twice daily.      Lipitor 20 q.d.      Clonidine 0.1 p.r.n. for blood pressure greater than 100.    Klonopin 1 mg q.h.s.           REVIEW OF SYSTEMS:   Really difficult in this patient.  However:    CHEST/RESPIRATORY:  He is not complaining of shortness of breath.  CARDIAC:  No chest pain.      GI:  No nausea, vomiting, or diarrhea.  No abdominal pain.  No dysphagia.   GU:  No dysuria.   MUSCULOSKELETAL:  He is complaining some of low back pain that does not radiate.   NEUROLOGICAL:  He is not complaining of headache, dizziness.  As far as anybody is aware, he has not had a seizure while he has been here.    PHYSICAL EXAMINATION:   VITAL SIGNS:     TEMPERATURE:  97.     PULSE:  74.     RESPIRATIONS:  18.     BLOOD PRESSURE:  120/70.    02 SATURATIONS:  95%.     CHEST/RESPIRATORY:  Clear air entry bilaterally.    CARDIOVASCULAR:   CARDIAC:  Heart sounds are normal.  He appears to be euvolemic.       GASTROINTESTINAL:   ABDOMEN:  Distended.   LIVER/SPLEEN/KIDNEYS:  No liver, no spleen.  No shifting dullness.  No stigmata of chronic liver disease.   GENITOURINARY:   BLADDER:  No palpable bladder.  No tenderness.  No CVA tenderness.    CIRCULATION:   EDEMA/VARICOSITIES:  Extremities:  No edema.    NEUROLOGICAL:    MOTOR:  He has no asterixis that I can reproduce.   SENSATION/STRENGTH:  He is able to move his limbs, arms and legs.  He is generally weak, but I do not think this is focal.    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      Elevated ammonia level.  Neither sensitive nor specific for liver disease.  His liver function tests are normal and I am not going to  pursue this.    History of seizures.  I would agree that the Depakote seems to have been started by Psychiatry for a mood disorder.     Schizophrenia.  This appears stable on Risperidal 4 mg b.i.d.  At this point, I really do not think that the Depakote is necessary for this problem.    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  I do not have a sense of the effect here.      CPT CODE: 40347

## 2014-12-15 NOTE — Clinical Social Work Note (Signed)
Clinical Social Worker attempted to contact patient's guardian, Blenda Mounts in reference to patient's admission. CSW awaiting returned phone call to complete psychosocial assessment.   CSW remains available as needed.   Glendon Axe, MSW, LCSWA [phone] 12/15/2014 9:52 AM

## 2014-12-15 NOTE — Progress Notes (Addendum)
PULMONARY / CRITICAL CARE MEDICINE HISTORY AND PHYSICAL EXAMINATION   Name: Kevin Humphrey MRN: [id number] DOB: 03-02-1951    ADMISSION DATE:  12/12/2014  PRIMARY SERVICE: PCCM  CHIEF COMPLAINT:  AMS  BRIEF PATIENT DESCRIPTION: 64 y/o man [place] w/ hx of schizophrenia, prior PNA, p/w AMS found to have severe sepsis of pulmonary origin with respiratory failure.   LINES / TUBES: CVC RIJ (placed by ED) - 12/12/14  CULTURES: Blood - 12/12/14 - in process Lower resp Cx - 12/12/14 -  ANTIBIOTICS: Pip-Tazo: 12/12/14 --> Vanc: 12/12/14 -->   SIGNIFICANT EVENTS / STUDIES:  Intubated in ED CT head 8/20 >neg   12/13/14:  Weaned off pressors overnight.  Sedated on vent , awakens but does not follow commands  12/14/14: Still on vent. Off pressors since yesterday. Psych meds being restarted. On fent gtt -> RASS -2, follows some commands. Moves all 4s. Failed SBT x 2. MRSA PCR +   SUBJECTIVE/OVERNIGHT/INTERVAL HX 12/15/14: Off pressors. Did do SBT for 2-3h but now failing. Hypoactive delirium +. Rt IJ site bruise as opposed to erythemia. CXR still wet. Cultures resp with MSSA and blood with GPR  VITAL SIGNS: Temp:  [97.6 F (36.4 C)-100.8 F (38.2 C)] 98.7 F (37.1 C) (08/23 0900) Pulse Rate:  [71-117] 76 (08/23 0900) Resp:  [16-24] 16 (08/23 0900) BP: (85-166)/(38-116) 115/61 mmHg (08/23 0900) SpO2:  [91 %-100 %] 99 % (08/23 0900) FiO2 (%):  [40 %] 40 % (08/23 0800) Weight:  [90.9 kg (200 lb 6.4 oz)] 90.9 kg (200 lb 6.4 oz) (08/23 0422) HEMODYNAMICS:   VENTILATOR SETTINGS: Vent Mode:  [-] PSV;CPAP FiO2 (%):  [40 %] 40 % Set Rate:  [20 bmp] 20 bmp Vt Set:  [450 mL] 450 mL PEEP:  [5 cmH20] 5 cmH20 Pressure Support:  [5 cmH20] 5 cmH20 Plateau Pressure:  [17 cmH20-21 cmH20] 21 cmH20 INTAKE / OUTPUT: Intake/Output      08/22 0701 - 08/23 0700 08/23 0701 - 08/24 0700   I.V. (mL/kg) 308.3 (3.4) 10 (0.1)   NG/GT 896.3 100   IV Piggyback 1410    Total Intake(mL/kg) 2614.5  (28.8) 110 (1.2)   Urine (mL/kg/hr) 1465 (0.7)    Total Output 1465     Net +1149.5 +110          PHYSICAL EXAMINATION: General:  Critically ill looking  man appearing stated age, intubated and sedated. Neuro:  RASS 0 to -1 follows simple commands, moves all 4s but confused HEENT: ETT  Neck: No JVD  Cardiovascular:  Tachycardic, no M/R/G Lungs:  Crackles heard with mechanical breath sounds. thick secretions.+ improved Abdomen:  Soft, BS + Musculoskeletal:  No deformities. Skin:  No rashes.  LABS: PULMONARY  Recent Labs Lab 12/12/14 0422 12/12/14 0458 12/12/14 0650 12/12/14 1725 12/13/14 0345  PHART  --  7.279* 7.305* 7.396 7.473*  PCO2ART  --  48.5* 37.1 29.7* 29.3*  PO2ART  --  102.0* 110.0* 61.0* 157.0*  HCO3  --  22.5 18.2* 18.2* 21.5  TCO2 22 24 19 19 22   O2SAT  --  96.0 97.0 91.0 100.0    CBC  Recent Labs Lab 12/13/14 0846 12/14/14 0446 12/15/14 0424  HGB 11.1* 11.3* 9.9*  HCT 32.9* 34.0* 29.8*  WBC 5.0 7.0 6.8  PLT 200 202 196    COAGULATION No results for input(s): INR in the last 168 hours.  CARDIAC  No results for input(s): TROPONINI in the last 168 hours. No results for input(s): PROBNP in the last  168 hours.   CHEMISTRY  Recent Labs Lab 12/12/14 0415 12/12/14 0422 12/13/14 0514 12/13/14 1230 12/13/14 2339 12/14/14 0446 12/14/14 1314 12/14/14 2315 12/15/14 0424  NA 139 142 141  --   --  141  --   --  140  K 4.3 3.9 3.3*  --   --  3.2*  --   --  3.9  CL 106 107 110  --   --  110  --   --  110  CO2 19*  --  24  --   --  25  --   --  25  GLUCOSE 138* 136* 92  --   --  111*  --   --  108*  BUN 18 23* 21*  --   --  22*  --   --  17  CREATININE 1.66* 1.60* 1.27*  --   --  0.90  --   --  0.79  CALCIUM 8.4*  --  7.8*  --   --  8.1*  --   --  7.8*  MG  --   --   --  1.5* 2.3 2.3 2.1 1.9  --   PHOS  --   --   --  <1.0* 2.4* 2.6 1.8* 3.5  --    Estimated Creatinine Clearance: 106.1 mL/min (by C-G formula based on Cr of  0.79).   LIVER  Recent Labs Lab 12/12/14 0415 12/14/14 0446  AST 32 28  ALT 13* 42  ALKPHOS 54 61  BILITOT 1.0 0.5  PROT 6.5 6.0*  ALBUMIN 3.3* 2.3*     INFECTIOUS  Recent Labs Lab 12/12/14 0423  LATICACIDVEN 1.26     ENDOCRINE CBG (last 3)   Recent Labs  12/14/14 2003 12/15/14 0033 12/15/14 0333  GLUCAP 120* 111* 110*         IMAGING x48h  - image(s) personally visualized  -   highlighted in bold Dg Chest Port 1 View  12/15/2014   CLINICAL DATA:  Intubation.  EXAM: PORTABLE CHEST - 1 VIEW  COMPARISON:  12/14/2014.  FINDINGS: Endotracheal tube 1.7 cm above the carina. NG tube tip below left hemidiaphragm. Right IJ line with tip in cavoatrial junction. Heart size normal. Continued progression of bibasilar atelectasis and/or infiltrates. Tiny pleural effusions can't be excluded. No pneumothorax. Old right rib fractures are present.  IMPRESSION: 1. Endotracheal tube 1.7 cm above the carina. Proximal repositioning of 1 cm should be considered . NG tube and right IJ line in stable position. 2. Progressive bibasilar atelectasis and/or infiltrates. Tiny pleural effusions cannot be excluded. These results will be called to the ordering clinician or representative by the Radiologist Assistant, and communication documented in the PACS or zVision Dashboard.   Electronically Signed   By: Marcello Moores  Register   On: 12/15/2014 07:24   Dg Chest Port 1 View  12/14/2014   CLINICAL DATA:  Pneumonia.  Shortness of breath.  EXAM: PORTABLE CHEST - 1 VIEW  COMPARISON:  12/13/2014.  FINDINGS: Endotracheal tube, NG tube, right IJ line stable position. Cardiomegaly. Progressive bibasilar atelectasis and/or infiltrates are noted with bilateral small pleural effusions. No pneumothorax.  IMPRESSION: 1. Lines and tubes in stable position. 2. Progressive bibasilar atelectasis and/or infiltrates. Small pleural effusions. 3. Stable cardiomegaly.   Electronically Signed   By: Marcello Moores  Register   On:  12/14/2014 07:08         ASSESSMENT / PLAN:  Active Problems:   Pneumonia   Malnutrition of moderate degree  Pressure ulcer   Acute respiratory failure with hypoxia   Staphylococcal pneumonia   Septic shock   AKI (acute kidney injury)   Respiratory failure   PULMONARY A: Acute Resp Failure  Due to HCAP v Aspiration   - on 40% fio2. Mental status barrier to extubation and resp fatigue with SBT  P:   No extubation 12/15/2014  Full Vent Support Daily  WUA/SBT ABX per ID sxn   CARDIOVASCULAR A: Septic Shock>improved , weaned off pressors 8/21 early am  HTN -home meds on hold with clonidine .   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   - on tube feeds P:   PPI  Cont tube feeds  HEMATOLOGIC A:Anemia of critical illness - no obvious bleed P:   Monitor  Hep for DVT prophylaxis  PRBC per protocl  INFECTIOUS Results for orders placed or performed during the hospital encounter of 12/12/14  Blood Culture (routine x 2)     Status: None (Preliminary result)   Collection Time: 12/12/14  4:10 AM  Result Value Ref Range Status   Specimen Description BLOOD LEFT ANTECUBITAL  Final   Special Requests BOTTLES DRAWN AEROBIC AND ANAEROBIC 5CCS  Final   Culture  Setup Time   Final    GRAM POSITIVE RODS IN ANAEROBIC BOTTLE CRITICAL RESULT CALLED TO, READ BACK BY AND VERIFIED WITH: RN ROBIN,L AT 1740 [id number] MARTINB CONFIRMED BY K WOOTEN    Culture DIPHTHEROIDS(CORYNEBACTERIUM SPECIES)  Final   Report Status PENDING  Incomplete  Blood Culture (routine x 2)     Status: None (Preliminary result)   Collection Time: 12/12/14  4:10 AM  Result Value Ref Range Status   Specimen Description BLOOD LEFT FEMORAL ARTERY  Final   Special Requests AEB  5CC    Final   Culture  Setup Time   Final    GRAM POSITIVE RODS AEROBIC BOTTLE ONLY CRITICAL RESULT CALLED TO, READ BACK BY AND VERIFIED WITH: Levada Dy RN [id number] GREEN R CONFIRMED BY M. CAMPBELL     Culture   Final    GRAM POSITIVE RODS CULTURE REINCUBATED FOR BETTER GROWTH    Report Status PENDING  Incomplete  Urine culture     Status: None   Collection Time: 12/12/14  4:50 AM  Result Value Ref Range Status   Specimen Description URINE, CATHETERIZED  Final   Special Requests Normal  Final   Culture >=100,000 COLONIES/mL AEROCOCCUS URINAE  Final   Report Status 12/14/2014 FINAL  Final  MRSA PCR Screening     Status: Abnormal   Collection Time: 12/12/14  6:37 AM  Result Value Ref Range Status   MRSA by PCR POSITIVE (A) NEGATIVE Final    Comment: RESULT CALLED TO, READ BACK BY AND VERIFIED WITH: A. BEACHY,RN AT [id number] ON [id number] BY K. PEELE        The GeneXpert MRSA Assay (FDA approved for NASAL specimens only), is one component of a comprehensive MRSA colonization surveillance program. It is not intended to diagnose MRSA infection nor to guide or monitor treatment for MRSA infections.   Culture, respiratory (NON-Expectorated)     Status: None   Collection Time: 12/12/14  6:41 AM  Result Value Ref Range  Status   Specimen Description TRACHEAL ASPIRATE  Final   Special Requests NONE  Final   Gram Stain   Final    MODERATE WBC PRESENT, PREDOMINANTLY PMN NO SQUAMOUS EPITHELIAL CELLS SEEN ABUNDANT GRAM POSITIVE COCCI IN PAIRS IN CLUSTERS FEW GRAM NEGATIVE COCCI FEW GRAM NEGATIVE RODS    Culture   Final    MODERATE STAPHYLOCOCCUS AUREUS Note: RIFAMPIN AND GENTAMICIN SHOULD NOT BE USED AS SINGLE DRUGS FOR TREATMENT OF STAPH INFECTIONS. Performed at [facility]    Report Status 12/15/2014 FINAL  Final   Organism ID, Bacteria STAPHYLOCOCCUS AUREUS  Final      Susceptibility   Staphylococcus aureus - MIC*    CLINDAMYCIN <=0.25 SENSITIVE Sensitive     ERYTHROMYCIN  <=0.25 SENSITIVE Sensitive     GENTAMICIN <=0.5 SENSITIVE Sensitive     LEVOFLOXACIN 0.25 SENSITIVE Sensitive     OXACILLIN 0.5 SENSITIVE Sensitive     RIFAMPIN <=0.5 SENSITIVE Sensitive     TRIMETH/SULFA <=10 SENSITIVE Sensitive     VANCOMYCIN 1 SENSITIVE Sensitive     TETRACYCLINE <=1 SENSITIVE Sensitive     MOXIFLOXACIN <=0.25 SENSITIVE Sensitive     * MODERATE STAPHYLOCOCCUS AUREUS    A: Sepsis of pulmonary origin, HCAP   - MSSA  Confirmed but MRSA PCR +  - GPR in blood  P:   8/20 Ceftazidime ?> 12/14/14 8/20 Vanc  > 8/21, 8/21 >> continue per ID pharm Clinda 12/13/14 >12/13/14        ENDOCRINE A: No known issues P:   Routine blood glucose checks. Add SSI if BS >180   NEUROLOGIC A: Hx of schizophrenia Need for sedation.   - RASS -2/-1 with fent prn; some improved  P:   fent prn If agitated, consider precedex  Holding home meds,  klonopin,rissperdal , nuedexta - Home meds  back on cogentin, zoloft and depakote as of 12/14/2014 Consider restarting slowly    DERM A: RN says no stage 1 ulcer because it blanches - 12/14/2014 P Monitor   BEST PRACTICE / DISPOSITION Level of Care:  ICU Primary Service:  PCCM Consultants:  None Code Status:  Full Diet:  NPO DVT Px:  Heparin GI Px:  PPI  Skin Integrity:  Intact Social / Family: non avaiblable .[place] visited him 12/14/2014 . Will try to talk to her    TODAY'S SUMMARY: 64 y/o man with  HCAP. Failure tow ean due to hypactive delirium and pulm infitlrates    The patient is critically ill with multiple organ systems failure and requires high complexity decision making for assessment and support, frequent evaluation and titration of therapies, application of advanced monitoring technologies and extensive interpretation of multiple databases.   Critical Care Time devoted to patient care services described in this note is  35  Minutes. This time reflects time of care of this signee Dr Brand Males.  This critical care time does not reflect procedure time, or teaching time or supervisory time of PA/NP/Med student/Med Resident etc but could involve care discussion time    Dr. Brand Males, M.D., [facility].C.P Pulmonary and Critical Care Medicine Staff Physician [facility] Pager: [phone], If no answer or between  15:00h - 7:00h: call [phone]  12/15/2014 10:44 AM

## 2014-12-15 NOTE — Clinical Documentation Improvement (Signed)
Critical Care  Please document in the progress notes and discharge summary if a condition below provides greater specificity regarding the patient's altered mental status:   - Encephalopathy, including type and suspected or known cause  - Other condition  - Unable to clinically determine  Clinical Information: Patient admitted with Sepsis, Septic Shock requiring pressors, likely MRSA pneumonia "Altered Mental Status" is documented in the H&P and subsequent progress notes. Patient was "found unresponsive, not responding to stimuli" per H&P   Thank You,  Erling Conte RN BSN CCDS [place]

## 2014-12-16 ENCOUNTER — Inpatient Hospital Stay (HOSPITAL_COMMUNITY): Payer: Medicare Other

## 2014-12-16 LAB — CULTURE, BLOOD (ROUTINE X 2)

## 2014-12-16 LAB — BASIC METABOLIC PANEL
Anion gap: 8 (ref 5–15)
BUN: 21 mg/dL — AB (ref 6–20)
CALCIUM: 7.9 mg/dL — AB (ref 8.9–10.3)
CHLORIDE: 100 mmol/L — AB (ref 101–111)
CO2: 30 mmol/L (ref 22–32)
CREATININE: 0.86 mg/dL (ref 0.61–1.24)
GFR calc Af Amer: >60 mL/min (ref >60)
GFR calc non Af Amer: >60 mL/min (ref >60)
Glucose, Bld: 110 mg/dL — ABNORMAL HIGH (ref 65–99)
Potassium: 3.7 mmol/L (ref 3.5–5.1)
SODIUM: 138 mmol/L (ref 135–145)

## 2014-12-16 LAB — CBC WITH DIFFERENTIAL/PLATELET
Basophils Absolute: 0 10*3/uL (ref 0.0–0.1)
Basophils Relative: 1 % (ref 0–1)
EOS ABS: 0.4 10*3/uL (ref 0.0–0.7)
Eosinophils Relative: 5 % (ref 0–5)
HEMATOCRIT: 28.8 % — AB (ref 39.0–52.0)
HEMOGLOBIN: 9.6 g/dL — AB (ref 13.0–17.0)
LYMPHS ABS: 1.2 10*3/uL (ref 0.7–4.0)
Lymphocytes Relative: 17 % (ref 12–46)
MCH: 29.5 pg (ref 26.0–34.0)
MCHC: 33.3 g/dL (ref 30.0–36.0)
MCV: 88.6 fL (ref 78.0–100.0)
MONO ABS: 1.9 10*3/uL — AB (ref 0.1–1.0)
MONOS PCT: 27 % — AB (ref 3–12)
NEUTROS PCT: 50 % (ref 43–77)
Neutro Abs: 3.5 10*3/uL (ref 1.7–7.7)
Platelets: 214 10*3/uL (ref 150–400)
RBC: 3.25 MIL/uL — ABNORMAL LOW (ref 4.22–5.81)
RDW: 14.8 % (ref 11.5–15.5)
WBC: 6.9 10*3/uL (ref 4.0–10.5)

## 2014-12-16 LAB — POCT I-STAT 3, ART BLOOD GAS (G3+)
Acid-Base Excess: 3 mmol/L — ABNORMAL HIGH (ref 0.0–2.0)
Bicarbonate: 28.1 mEq/L — ABNORMAL HIGH (ref 20.0–24.0)
O2 SAT: 86 %
PCO2 ART: 43 mmHg (ref 35.0–45.0)
Patient temperature: 98.6
TCO2: 29 mmol/L (ref 0–100)
pH, Arterial: 7.424 (ref 7.350–7.450)
pO2, Arterial: 50 mmHg — ABNORMAL LOW (ref 80.0–100.0)

## 2014-12-16 LAB — GLUCOSE, CAPILLARY
GLUCOSE-CAPILLARY: 114 mg/dL — AB (ref 65–99)
GLUCOSE-CAPILLARY: 135 mg/dL — AB (ref 65–99)
GLUCOSE-CAPILLARY: 123 mg/dL — AB (ref 65–99)
Glucose-Capillary: 109 mg/dL — ABNORMAL HIGH (ref 65–99)
Glucose-Capillary: 111 mg/dL — ABNORMAL HIGH (ref 65–99)
Glucose-Capillary: 116 mg/dL — ABNORMAL HIGH (ref 65–99)
Glucose-Capillary: 143 mg/dL — ABNORMAL HIGH (ref 65–99)

## 2014-12-16 LAB — PHOSPHORUS: Phosphorus: 2.8 mg/dL (ref 2.5–4.6)

## 2014-12-16 LAB — MAGNESIUM: MAGNESIUM: 1.6 mg/dL — AB (ref 1.7–2.4)

## 2014-12-16 MED ORDER — BENZTROPINE MESYLATE 1 MG/ML IJ SOLN
1.0000 mg | Freq: Two times a day (BID) | INTRAMUSCULAR | Status: DC
  Administered 2014-12-16 – 2014-12-17 (×3): 1 mg via INTRAVENOUS
  Filled 2014-12-16 (×5): qty 1

## 2014-12-16 MED ORDER — MAGNESIUM SULFATE 2 GM/50ML IV SOLN
2.0000 g | Freq: Once | INTRAVENOUS | Status: AC
  Administered 2014-12-16: 2 g via INTRAVENOUS
  Filled 2014-12-16: qty 50

## 2014-12-16 MED ORDER — POTASSIUM CHLORIDE 10 MEQ/50ML IV SOLN
10.0000 meq | INTRAVENOUS | Status: AC
  Administered 2014-12-16 (×2): 10 meq via INTRAVENOUS
  Filled 2014-12-16 (×2): qty 50

## 2014-12-16 MED ORDER — CETYLPYRIDINIUM CHLORIDE 0.05 % MT LIQD
7.0000 mL | Freq: Two times a day (BID) | OROMUCOSAL | Status: DC
  Administered 2014-12-17 (×3): 7 mL via OROMUCOSAL

## 2014-12-16 MED ORDER — VALPROATE SODIUM 500 MG/5ML IV SOLN
125.0000 mg | Freq: Two times a day (BID) | INTRAVENOUS | Status: DC
  Administered 2014-12-16 – 2014-12-17 (×3): 125 mg via INTRAVENOUS
  Filled 2014-12-16 (×4): qty 1.25

## 2014-12-16 MED ORDER — BENZTROPINE MESYLATE 1 MG/ML IJ SOLN
1.0000 mg | Freq: Two times a day (BID) | INTRAMUSCULAR | Status: DC
  Filled 2014-12-16 (×2): qty 1

## 2014-12-16 MED ORDER — CETYLPYRIDINIUM CHLORIDE 0.05 % MT LIQD: 7.0000 mL | Freq: Two times a day (BID) | OROMUCOSAL | Status: DC

## 2014-12-16 MED ORDER — SODIUM CHLORIDE 0.9 % IV SOLN
3.0000 g | Freq: Three times a day (TID) | INTRAVENOUS | Status: AC
  Administered 2014-12-16 – 2014-12-22 (×20): 3 g via INTRAVENOUS
  Filled 2014-12-16 (×22): qty 3

## 2014-12-16 MED ORDER — CHLORHEXIDINE GLUCONATE 0.12 % MT SOLN
15.0000 mL | Freq: Two times a day (BID) | OROMUCOSAL | Status: DC
  Administered 2014-12-16 (×2): 15 mL via OROMUCOSAL
  Filled 2014-12-16: qty 15

## 2014-12-16 NOTE — Care Management Important Message (Signed)
Important Message  Patient Details  Name: Kevin Humphrey MRN: [id number] Date of Birth: November 03, 1950   Medicare Important Message Given:  [facility] notification given    Nathen May 12/16/2014, 11:47 AMImportant Message  Patient Details  Name: Kevin Humphrey MRN: [id number] Date of Birth: 13-May-1950   Medicare Important Message Given:  Yes-second notification given    Nathen May 12/16/2014, 11:47 AM

## 2014-12-16 NOTE — Progress Notes (Signed)
Asked by the RT to evaluate Kevin Humphrey for dyspnea. Admitted with Staph aureus pneumonia, vent dependent resp failure. Extubated today. Looks to be in resp distress with increased WOB and accessory muscle use. Sats are 100% on 5 lt O2.  ABG- 7.42/43/50/86%. May be venous as he has a good pleth waveform with 100% sats Start Bipap. Close monitoring. May need reintubation.  Kevin Garfinkel MD [facility] Pager [phone] If no answer or after 3pm call: [phone] 12/16/2014, 6:05 PM

## 2014-12-16 NOTE — Progress Notes (Signed)
Spoke to patient's guardian on telephone, baseline mental status is minimal- pt is able to tell her his name and generally can form simple sentences. Pt was scheduled to have swallow evaluation at home facility before admission to the hospital. Occupational therapy consulted but has not come by the room today.

## 2014-12-16 NOTE — Progress Notes (Signed)
PULMONARY / CRITICAL CARE MEDICINE HISTORY AND PHYSICAL EXAMINATION   Name: Kevin Humphrey MRN: [id number] DOB: 1950-10-16    ADMISSION DATE:  12/12/2014  PRIMARY SERVICE: PCCM  CHIEF COMPLAINT:  AMS  BRIEF PATIENT DESCRIPTION: 64 y/o man ([place]) w/ hx of schizophrenia, prior PNA, p/w AMS found to have severe sepsis of pulmonary origin with respiratory failure.   LINES / TUBES: CVC RIJ (placed by ED) - 12/12/14  CULTURES: Blood - 12/12/14 - in process Lower resp Cx - 12/12/14 -  ANTIBIOTICS: Pip-Tazo: 12/12/14 --> Vanc: 12/12/14 -->   SIGNIFICANT EVENTS / STUDIES:  Intubated in ED CT head 8/20 >neg   12/13/14:  Weaned off pressors overnight.  Sedated on vent , awakens but does not follow commands  12/14/14: Still on vent. Off pressors since yesterday. Psych meds being restarted. On fent gtt -> RASS -2, follows some commands. Moves all 4s. Failed SBT x 2. MRSA PCR +  12/15/14: Off pressors. Did do SBT for 2-3h but now failing. Hypoactive delirium +. Rt IJ site bruise as opposed to erythemia. CXR still wet. Cultures resp with MSSA and blood with GPR   SUBJECTIVE/OVERNIGHT/INTERVAL HX 12/16/14 =- different microbes from different sources. Pharm recommends current abx for now and then narrow to Unasyn depending on blood sensit. Doing SBT.  Having lot of resp secretions - thick and tan. On Lasix  VITAL SIGNS: Temp:  [97 F (36.1 C)-101.6 F (38.7 C)] 98.7 F (37.1 C) (08/24 0800) Pulse Rate:  [76-102] 86 (08/24 0900) Resp:  [18-30] 27 (08/24 0900) BP: (110-152)/(58-87) 120/58 mmHg (08/24 0900) SpO2:  [92 %-100 %] 95 % (08/24 0900) FiO2 (%):  [40 %] 40 % (08/24 0749) Weight:  [89.1 kg (196 lb 6.9 oz)] 89.1 kg (196 lb 6.9 oz) (08/24 0500) HEMODYNAMICS:   VENTILATOR SETTINGS: Vent Mode:  [-] PSV FiO2 (%):  [40 %] 40 % Set Rate:  [20 bmp] 20 bmp Vt Set:  [450 mL] 450 mL PEEP:  [5 cmH20] 5 cmH20 Pressure Support:  [5 cmH20] 5 cmH20 Plateau Pressure:  [17 cmH20-19 cmH20]  17 cmH20 INTAKE / OUTPUT: Intake/Output      08/23 0701 - 08/24 0700 08/24 0701 - 08/25 0700   I.V. (mL/kg) 240 (2.7) 20 (0.2)   NG/GT 1965 140   IV Piggyback 600    Total Intake(mL/kg) 2805 (31.5) 160 (1.8)   Urine (mL/kg/hr) 3740 (1.7)    Total Output 3740     Net -935 +160          PHYSICAL EXAMINATION: General:  Critically ill looking  man appearing stated age, intubated and sedated. Neuro:  RASS 0  follows simple commands, moves all 4s but confused HEENT: ETT  Neck: No JVD  Cardiovascular:  Tachycardic, no M/R/G Lungs:  Crackles heard with mechanical breath sounds. thick secretions.+ improved Abdomen:  Soft, BS + Musculoskeletal:  No deformities. Skin:  No rashes.  LABS: PULMONARY  Recent Labs Lab 12/12/14 0422 12/12/14 0458 12/12/14 0650 12/12/14 1725 12/13/14 0345  PHART  --  7.279* 7.305* 7.396 7.473*  PCO2ART  --  48.5* 37.1 29.7* 29.3*  PO2ART  --  102.0* 110.0* 61.0* 157.0*  HCO3  --  22.5 18.2* 18.2* 21.5  TCO2 22 24 19 19 22   O2SAT  --  96.0 97.0 91.0 100.0    CBC  Recent Labs Lab 12/14/14 0446 12/15/14 0424 12/16/14 0500  HGB 11.3* 9.9* 9.6*  HCT 34.0* 29.8* 28.8*  WBC 7.0 6.8 6.9  PLT 202 196  214    COAGULATION No results for input(s): INR in the last 168 hours.  CARDIAC  No results for input(s): TROPONINI in the last 168 hours. No results for input(s): PROBNP in the last 168 hours.   CHEMISTRY  Recent Labs Lab 12/12/14 0415 12/12/14 0422 12/13/14 0514  12/14/14 0446 12/14/14 1314 12/14/14 2315 12/15/14 0424 12/15/14 1153 12/16/14 0013 12/16/14 0500  NA 139 142 141  --  141  --   --  140  --   --  138  K 4.3 3.9 3.3*  --  3.2*  --   --  3.9  --   --  3.7  CL 106 107 110  --  110  --   --  110  --   --  100*  CO2 19*  --  24  --  25  --   --  25  --   --  30  GLUCOSE 138* 136* 92  --  111*  --   --  108*  --   --  110*  BUN 18 23* 21*  --  22*  --   --  17  --   --  21*  CREATININE 1.66* 1.60* 1.27*  --  0.90  --   --   0.79  --   --  0.86  CALCIUM 8.4*  --  7.8*  --  8.1*  --   --  7.8*  --   --  7.9*  MG  --   --   --   < > 2.3 2.1 1.9  --  1.8 1.6*  --   PHOS  --   --   --   < > 2.6 1.8* 3.5  --  2.8 2.8  --   < > = values in this interval not displayed. Estimated Creatinine Clearance: 91.2 mL/min (by C-G formula based on Cr of 0.86).   LIVER  Recent Labs Lab 12/12/14 0415 12/14/14 0446  AST 32 28  ALT 13* 42  ALKPHOS 54 61  BILITOT 1.0 0.5  PROT 6.5 6.0*  ALBUMIN 3.3* 2.3*     INFECTIOUS  Recent Labs Lab 12/12/14 0423  LATICACIDVEN 1.26     ENDOCRINE CBG (last 3)   Recent Labs  12/16/14 0015 12/16/14 0357 12/16/14 0722  GLUCAP 143* 114* 135*         IMAGING x48h  - image(s) personally visualized  -   highlighted in bold Dg Chest Port 1 View  12/16/2014   CLINICAL DATA:  Endotracheal tube position.  EXAM: PORTABLE CHEST - 1 VIEW  COMPARISON:  December 15, 2014.  FINDINGS: Stable cardiomediastinal silhouette. Endotracheal and nasogastric tubes are unchanged in position. Right internal jugular catheter line is again noted with distal tip overlying expected position of the SVC. No pneumothorax is noted. Old right rib fractures are noted. Stable bilateral basilar opacities are noted concerning for pneumonia or atelectasis.  IMPRESSION: Stable support apparatus. Stable bibasilar opacities concerning for pneumonia or atelectasis.   Electronically Signed   By: Marijo Conception, M.D.   On: 12/16/2014 07:14   Dg Chest Port 1 View  12/15/2014   CLINICAL DATA:  Intubation.  EXAM: PORTABLE CHEST - 1 VIEW  COMPARISON:  12/14/2014.  FINDINGS: Endotracheal tube 1.7 cm above the carina. NG tube tip below left hemidiaphragm. Right IJ line with tip in cavoatrial junction. Heart size normal. Continued progression of bibasilar atelectasis and/or infiltrates. Tiny pleural effusions can't be excluded. No pneumothorax. Old  right rib fractures are present.  IMPRESSION: 1. Endotracheal tube 1.7 cm above  the carina. Proximal repositioning of 1 cm should be considered . NG tube and right IJ line in stable position. 2. Progressive bibasilar atelectasis and/or infiltrates. Tiny pleural effusions cannot be excluded. These results will be called to the ordering clinician or representative by the Radiologist Assistant, and communication documented in the PACS or zVision Dashboard.   Electronically Signed   By: Marcello Moores  Register   On: 12/15/2014 07:24         ASSESSMENT / PLAN:  Active Problems:   Pneumonia   Malnutrition of moderate degree   Pressure ulcer   Acute respiratory failure with hypoxia   Staphylococcal pneumonia   Septic shock   AKI (acute kidney injury)   Respiratory failure   PULMONARY A: Acute Resp Failure  Due to HCAP v Aspiration   - Doing well on SBT. CXR with infiltrates and resp secretions oput him at some risk for reintubation  P:   extubation 12/16/2014  Daily  WUA/SBT ABX per ID sxn   CARDIOVASCULAR A: Septic Shock>improved , weaned off pressors 8/21 early am  HTN -home meds on hold with clonidine .   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   - on tube feeds P:   PPI  DC  tube feeds  HEMATOLOGIC A:Anemia of critical illness - no obvious bleed P:   Monitor  Hep for DVT prophylaxis  PRBC per protocl  INFECTIOUS  12/12/14 - blood  - corynebacterium 12/12/14 urine - aerococcus 8./20/16 - MRSA PCR - POSITIVE 12/12/14 - Sputum - MSSA     A: Sepsis of pulmonary origin, HCAP   - MSSA  Confirmed but MRSA PCR +  - GPR in blood  P:   8/20 Ceftazidime ?> 12/14/14 8/20 Vanc  > 8/21, 8/21 >> continue per ID pharm Clinda 12/13/14 >12/13/14   Dependiong on course consider ID consult        ENDOCRINE A: No known issues P:   Routine blood glucose checks. Add SSI if BS >180   NEUROLOGIC A: Hx of schizophrenia Need for sedation.   - RASS 0  with fent prn; some improved  P:   fent prn If agitated, consider precedex  Holding home meds,  klonopin,rissperdal , nuedexta - Home meds  back on cogentin, zoloft and depakote as of 12/14/2014 Consider restarting slowly    DERM A: RN says no stage 1 ulcer because it blanches - 12/14/2014 P Monitor   BEST PRACTICE / DISPOSITION Level of Care:  ICU Primary Service:  PCCM Consultants:  None Code Status:  Full Diet:  NPO DVT Px:  Heparin GI Px:  PPI  Skin Integrity:  Intact Social / Family: non avaiblable .[place] visited him 12/14/2014 . Will try to talk to her    TODAY'S SUMMARY: 64 y/o man with  HCAP Extuibate 12/16/2014     The patient is critically ill with multiple organ systems failure and requires high complexity decision making for assessment and support, frequent evaluation and titration of therapies, application of advanced monitoring technologies and extensive interpretation of multiple databases.   Critical  Care Time devoted to patient care services described in this note is  35  Minutes. This time reflects time of care of this signee Dr Brand Males. This critical care time does not reflect procedure time, or teaching time or supervisory time of PA/NP/Med student/Med Resident etc but could involve care discussion time    Dr. Brand Males, M.D., [facility].P Pulmonary and Critical Care Medicine Staff Physician [place] [facility] Pager: [phone], If no answer or between  15:00h - 7:00h: call [phone]  12/16/2014 9:26 AM

## 2014-12-16 NOTE — Progress Notes (Signed)
Unable to assess pt baseline mental status. Since extubation, pt has answered few questions verbally but nods/gestures appropriately. Attempted to call Social worker/guardian but no answer. Will call again later.

## 2014-12-16 NOTE — Procedures (Signed)
Extubation Procedure Note  Patient Details:   Name: Kevin Humphrey DOB: 02-Jan-1951 MRN: [id number]   Airway Documentation:     Evaluation  O2 sats: stable throughout Complications: No apparent complications Patient did tolerate procedure well. Bilateral Breath Sounds: Clear, Diminished Suctioning: Airway Yes  Ciro Backer 12/16/2014, 10:08 AM

## 2014-12-16 NOTE — Progress Notes (Signed)
ANTIBIOTIC CONSULT NOTE - INITIAL  Pharmacy Consult for Unasyn Indication: pneumonia   No Known Allergies  Patient Measurements: Height: 5\' 11"  (180.3 cm) Weight: 196 lb 6.9 oz (89.1 kg) IBW/kg (Calculated) : 75.3  Vital Signs: Temp: 96.9 F (36.1 C) (08/24 1200) Temp Source: Oral (08/24 1158) BP: 141/71 mmHg (08/24 1200) Pulse Rate: 80 (08/24 1200) Intake/Output from previous day: 08/23 0701 - 08/24 0700 In: 2805 [I.V.:240; MP/NT:6144; IV Piggyback:600] Out: 3740 [Urine:3740] Intake/Output from this shift: Total I/O In: 450 [I.V.:50; NG/GT:350; IV Piggyback:50] Out: 900 [Urine:900]  Labs:  Recent Labs  12/14/14 0446 12/15/14 0424 12/16/14 0500  WBC 7.0 6.8 6.9  HGB 11.3* 9.9* 9.6*  PLT 202 196 214  CREATININE 0.90 0.79 0.86   Estimated Creatinine Clearance: 91.2 mL/min (by C-G formula based on Cr of 0.86). No results for input(s): VANCOTROUGH, VANCOPEAK, VANCORANDOM, GENTTROUGH, GENTPEAK, GENTRANDOM, TOBRATROUGH, TOBRAPEAK, TOBRARND, AMIKACINPEAK, AMIKACINTROU, AMIKACIN in the last 72 hours.   Microbiology: Recent Results (from the past 720 hour(s))  Blood Culture (routine x 2)     Status: None   Collection Time: 12/12/14  4:10 AM  Result Value Ref Range Status   Specimen Description BLOOD LEFT ANTECUBITAL  Final   Special Requests BOTTLES DRAWN AEROBIC AND ANAEROBIC 5CCS  Final   Culture  Setup Time   Final    GRAM POSITIVE RODS IN ANAEROBIC BOTTLE CRITICAL RESULT CALLED TO, READ BACK BY AND VERIFIED WITH: RN ROBIN,L AT [id number] MARTINB CONFIRMED BY K WOOTEN    Culture   Final    DIPHTHEROIDS(CORYNEBACTERIUM SPECIES) Standardized susceptibility testing for this organism is not available. MULTIPLE COLONY MORPHOLOGY TYPES PRESENT    Report Status 12/16/2014 FINAL  Final  Blood Culture (routine x 2)     Status: None   Collection Time: 12/12/14  4:10 AM  Result Value Ref Range Status   Specimen Description BLOOD LEFT FEMORAL ARTERY  Final   Special  Requests AEB  5CC   Final   Culture  Setup Time   Final    GRAM POSITIVE RODS AEROBIC BOTTLE ONLY CRITICAL RESULT CALLED TO, READ BACK BY AND VERIFIED WITH: Levada Dy RN [phone]     Culture   Final    DIPHTHEROIDS(CORYNEBACTERIUM SPECIES) Standardized susceptibility testing for this organism is not available. MULTIPLE COLONY MORPHOLOGY TYPES PRESENT    Report Status 12/16/2014 FINAL  Final  Urine culture     Status: None   Collection Time: 12/12/14  4:50 AM  Result Value Ref Range Status   Specimen Description URINE, CATHETERIZED  Final   Special Requests Normal  Final   Culture >=100,000 COLONIES/mL AEROCOCCUS URINAE  Final   Report Status 12/14/2014 FINAL  Final  MRSA PCR Screening     Status: Abnormal   Collection Time: 12/12/14  6:37 AM  Result Value Ref Range Status   MRSA by PCR POSITIVE (A) NEGATIVE Final    Comment: RESULT CALLED TO, READ BACK BY AND VERIFIED WITH: A. BEACHY,RN AT [id number] ON [id number] BY K. PEELE        The GeneXpert MRSA Assay (FDA approved for NASAL specimens only), is one component of a comprehensive MRSA colonization surveillance program. It is not intended to diagnose MRSA infection nor to guide or monitor treatment for MRSA infections.   Culture, respiratory (NON-Expectorated)     Status: None   Collection Time: 12/12/14  6:41 AM  Result Value Ref Range Status   Specimen Description TRACHEAL ASPIRATE  Final   Special Requests NONE  Final   Gram Stain   Final    MODERATE WBC PRESENT, PREDOMINANTLY PMN NO SQUAMOUS EPITHELIAL CELLS SEEN ABUNDANT GRAM POSITIVE COCCI IN PAIRS IN CLUSTERS FEW GRAM NEGATIVE COCCI FEW GRAM NEGATIVE RODS    Culture   Final    MODERATE STAPHYLOCOCCUS AUREUS Note: RIFAMPIN AND GENTAMICIN SHOULD NOT BE USED AS SINGLE DRUGS FOR TREATMENT OF STAPH INFECTIONS. Performed at [facility]    Report Status 12/15/2014 FINAL  Final   Organism ID, Bacteria STAPHYLOCOCCUS AUREUS   Final      Susceptibility   Staphylococcus aureus - MIC*    CLINDAMYCIN <=0.25 SENSITIVE Sensitive     ERYTHROMYCIN <=0.25 SENSITIVE Sensitive     GENTAMICIN <=0.5 SENSITIVE Sensitive     LEVOFLOXACIN 0.25 SENSITIVE Sensitive     OXACILLIN 0.5 SENSITIVE Sensitive     RIFAMPIN <=0.5 SENSITIVE Sensitive     TRIMETH/SULFA <=10 SENSITIVE Sensitive     VANCOMYCIN 1 SENSITIVE Sensitive     TETRACYCLINE <=1 SENSITIVE Sensitive     MOXIFLOXACIN <=0.25 SENSITIVE Sensitive     * MODERATE STAPHYLOCOCCUS AUREUS    Medical History: Past Medical History  Diagnosis Date  . GERD (gastroesophageal reflux disease) 08/02/2012  . Dysphagia, unspecified 08/02/2012  . Allergic rhinitis 08/02/2012  . Vertebral compression fracture 08/02/2012  . Other and unspecified hyperlipidemia 08/02/2012  . Schizophrenia 08/02/2012  . Depression 08/02/2012    Medications:  Prescriptions prior to admission  Medication Sig Dispense Refill Last Dose  . benztropine (COGENTIN) 2 MG tablet Take 1 mg by mouth 2 (two) times daily.    12/11/2014 at Unknown time  . clonazePAM (KLONOPIN) 0.5 MG tablet Take one tablet by mouth twice daily for anxiety; Take two tablets by mouth every night at bedtime for anxiety; Take one tablet by mouth once daily as needed for breakthrough pain (per fax Rx) (Patient taking differently: Take 0.5-1 mg by mouth 3 (three) times daily. 0.5 mg at 0800 and 1200 and 1 mg at 2100.) 150 tablet 5 12/11/2014 at Unknown time  . cloNIDine (CATAPRES) 0.1 MG tablet Take 0.1 mg by mouth at bedtime.   12/11/2014 at Unknown time  . Dextromethorphan-Quinidine (NUEDEXTA) 20-10 MG CAPS Take 1 tablet by mouth 2 (two) times daily.   12/11/2014 at Unknown time  . divalproex (DEPAKOTE SPRINKLE) 125 MG capsule Take 125 mg by mouth 2 (two) times daily.   12/11/2014 at Unknown time  . famotidine (PEPCID) 20 MG tablet Take 20 mg by mouth daily.    12/11/2014 at Unknown time  . hydrochlorothiazide (HYDRODIURIL) 25 MG tablet Take 12.5  mg by mouth daily.   12/11/2014 at Unknown time  . lactose free nutrition (BOOST) LIQD Take 237 mLs by mouth 2 (two) times daily between meals.   12/11/2014 at Unknown time  . lactulose, encephalopathy, (ENULOSE) 10 GM/15ML SOLN Take 20 g by mouth 3 (three) times daily.   12/11/2014 at Unknown time  . Multiple Vitamin (MULTIVITAMIN) tablet Take 1 tablet by mouth daily.   12/11/2014 at Unknown time  . risperiDONE (RISPERDAL) 2 MG tablet Take 4 mg by mouth 2 (two) times daily. Takes 2 mg in the am and 4 mg in the pm   12/11/2014 at Unknown time  . sertraline (ZOLOFT) 100 MG tablet Take 150 mg by mouth daily.    12/11/2014 at Unknown time  . triamcinolone (NASACORT) 55 MCG/ACT nasal inhaler Place 2 sprays into the nose 2 (two) times daily.  12/11/2014 at Unknown time  . [DISCONTINUED] atorvastatin (LIPITOR) 20 MG tablet Take 1 tablet (20 mg total) by mouth daily. (Patient not taking: Reported on 12/12/2014) 90 tablet 3 Not Taking at Unknown time  . [DISCONTINUED] LORazepam (ATIVAN) 0.5 MG tablet Take one tablet by mouth twice daily as needed for anxiety/agitation (Patient not taking: Reported on 12/12/2014) 60 tablet 5 Not Taking at Unknown time   Assessment: 64 y/o M from nursing home found unresponsive/hypotensive/febrile and required intubation in the ED. He has received 5 days of antibiotics for aspiration PNA and was extubated today.  Tmax 101.6 (now 99.9), WBC wnl, LA 1.26.  Unasyn 8/24 > Vancomycin 8/20 > 8/21, back on 8/22 > off 8/24 Fortaz 8/20 > 8/22  Clindamycin 8/22 > 8/22   MRSA PCR +  Resp Cx 8/20 > MSSA Urine cx 8/20 >  >100K aerococcus urinae BCx x2 8/20 > corynebacterium -susceptibility testing not available, usually a contaminant   Pt will be switched from vancomycin to Unasyn d/t C&S results.  Goal of Therapy:  Resolution of infection, clinical improvement  Plan:  Unasyn 3g IV q8h F/U renal fxn, clinical improvement, length of treatment  Governor Specking, PharmD Clinical  Pharmacy Resident Pager: [phone] 12/16/2014,12:50 PM

## 2014-12-17 ENCOUNTER — Inpatient Hospital Stay (HOSPITAL_COMMUNITY): Payer: Medicare Other

## 2014-12-17 DIAGNOSIS — Z515 Encounter for palliative care: Secondary | ICD-10-CM

## 2014-12-17 DIAGNOSIS — R1310 Dysphagia, unspecified: Secondary | ICD-10-CM

## 2014-12-17 LAB — MAGNESIUM: MAGNESIUM: 2 mg/dL (ref 1.7–2.4)

## 2014-12-17 LAB — BASIC METABOLIC PANEL
Anion gap: 8 (ref 5–15)
BUN: 23 mg/dL — AB (ref 6–20)
CHLORIDE: 100 mmol/L — AB (ref 101–111)
CO2: 31 mmol/L (ref 22–32)
Calcium: 8.6 mg/dL — ABNORMAL LOW (ref 8.9–10.3)
Creatinine, Ser: 0.87 mg/dL (ref 0.61–1.24)
GFR calc Af Amer: >60 mL/min (ref >60)
GFR calc non Af Amer: >60 mL/min (ref >60)
GLUCOSE: 103 mg/dL — AB (ref 65–99)
POTASSIUM: 4.4 mmol/L (ref 3.5–5.1)
SODIUM: 139 mmol/L (ref 135–145)

## 2014-12-17 LAB — GLUCOSE, CAPILLARY
GLUCOSE-CAPILLARY: 110 mg/dL — AB (ref 65–99)
GLUCOSE-CAPILLARY: 148 mg/dL — AB (ref 65–99)
GLUCOSE-CAPILLARY: 129 mg/dL — AB (ref 65–99)
Glucose-Capillary: 102 mg/dL — ABNORMAL HIGH (ref 65–99)
Glucose-Capillary: 103 mg/dL — ABNORMAL HIGH (ref 65–99)
Glucose-Capillary: 111 mg/dL — ABNORMAL HIGH (ref 65–99)

## 2014-12-17 LAB — CBC WITH DIFFERENTIAL/PLATELET
BASOS PCT: 0 % (ref 0–1)
Basophils Absolute: 0 10*3/uL (ref 0.0–0.1)
Eosinophils Absolute: 0.3 10*3/uL (ref 0.0–0.7)
Eosinophils Relative: 4 % (ref 0–5)
HCT: 31.8 % — ABNORMAL LOW (ref 39.0–52.0)
HEMOGLOBIN: 10.6 g/dL — AB (ref 13.0–17.0)
LYMPHS PCT: 17 % (ref 12–46)
Lymphs Abs: 1.3 10*3/uL (ref 0.7–4.0)
MCH: 30.2 pg (ref 26.0–34.0)
MCHC: 33.3 g/dL (ref 30.0–36.0)
MCV: 90.6 fL (ref 78.0–100.0)
MONOS PCT: 30 % — AB (ref 3–12)
Monocytes Absolute: 2.3 10*3/uL — ABNORMAL HIGH (ref 0.1–1.0)
NEUTROS ABS: 3.9 10*3/uL (ref 1.7–7.7)
Neutrophils Relative %: 49 % (ref 43–77)
Platelets: 281 10*3/uL (ref 150–400)
RBC: 3.51 MIL/uL — ABNORMAL LOW (ref 4.22–5.81)
RDW: 14.7 % (ref 11.5–15.5)
WBC: 7.8 10*3/uL (ref 4.0–10.5)

## 2014-12-17 LAB — PATHOLOGIST SMEAR REVIEW

## 2014-12-17 LAB — PHOSPHORUS: Phosphorus: 3.9 mg/dL (ref 2.5–4.6)

## 2014-12-17 MED ORDER — POTASSIUM CHLORIDE 20 MEQ/15ML (10%) PO SOLN
20.0000 meq | Freq: Two times a day (BID) | ORAL | Status: DC
  Administered 2014-12-17 (×2): 20 meq via ORAL
  Filled 2014-12-17 (×2): qty 15

## 2014-12-17 MED ORDER — BENZTROPINE MESYLATE 1 MG PO TABS
1.0000 mg | ORAL_TABLET | Freq: Two times a day (BID) | ORAL | Status: DC
  Administered 2014-12-17 – 2014-12-24 (×14): 1 mg via ORAL
  Filled 2014-12-17 (×19): qty 1

## 2014-12-17 MED ORDER — VALPROIC ACID 250 MG/5ML PO SYRP
125.0000 mg | ORAL_SOLUTION | Freq: Two times a day (BID) | ORAL | Status: DC
  Administered 2014-12-17 – 2014-12-24 (×12): 125 mg via ORAL
  Filled 2014-12-17: qty 2.5
  Filled 2014-12-17: qty 5
  Filled 2014-12-17: qty 2.5
  Filled 2014-12-17 (×2): qty 5
  Filled 2014-12-17: qty 2.5
  Filled 2014-12-17: qty 5
  Filled 2014-12-17 (×5): qty 2.5
  Filled 2014-12-17 (×4): qty 5

## 2014-12-17 MED ORDER — FAMOTIDINE 20 MG PO TABS
20.0000 mg | ORAL_TABLET | Freq: Every day | ORAL | Status: DC
  Administered 2014-12-17 – 2014-12-23 (×7): 20 mg via ORAL
  Filled 2014-12-17 (×8): qty 1

## 2014-12-17 MED ORDER — SERTRALINE HCL 50 MG PO TABS
150.0000 mg | ORAL_TABLET | Freq: Every day | ORAL | Status: DC
  Administered 2014-12-17 – 2014-12-23 (×6): 150 mg via ORAL
  Filled 2014-12-17 (×12): qty 1

## 2014-12-17 NOTE — Clinical Social Work Note (Signed)
FL-2 completed and clinical information faxed to [facility] for review.   CSW remains available as needed.   Glendon Axe, MSW, LCSWA [phone] 12/17/2014 3:59 PM

## 2014-12-17 NOTE — Progress Notes (Signed)
PULMONARY / CRITICAL CARE MEDICINE HISTORY AND PHYSICAL EXAMINATION   Name: Kevin Humphrey MRN: [id number] DOB: 1950/09/15    ADMISSION DATE:  12/12/2014  PRIMARY SERVICE: PCCM  CHIEF COMPLAINT:  AMS  BRIEF PATIENT DESCRIPTION: 64 y/o man (ward of state) w/ hx of schizophrenia, prior PNA, p/w AMS found to have severe sepsis of pulmonary origin with respiratory failure.   LINES / TUBES: CVC RIJ (placed by ED) - 12/12/14   SIGNIFICANT EVENTS / STUDIES:  Intubated in ED CT head 8/20 >neg   12/13/14:  Weaned off pressors overnight.  Sedated on vent , awakens but does not follow commands  12/14/14: Still on vent. Off pressors since yesterday. Psych meds being restarted. On fent gtt -> RASS -2, follows some commands. Moves all 4s. Failed SBT x 2. MRSA PCR +  12/15/14: Off pressors. Did do SBT for 2-3h but now failing. Hypoactive delirium +. Rt IJ site bruise as opposed to erythemia. CXR still wet. Cultures resp with MSSA and blood with GPR  12/16/14 =- different microbes from different sources. Pharm recommends current abx for now and then narrow to Unasyn depending on blood sensit. Doing SBT.  Having lot of resp secretions - thick and tan. On Lasix   SUBJECTIVE/OVERNIGHT/INTERVAL HX 12/17/2014 :  Needed biPAP overnight. Now on 6L [place]. Unclear if resp distres or head nod. RN says - DPOA attests to significant dementia. Patient had swallow eval pending at group home and speaks only minimal sentences. Currently very difficyult for him to follow command thouhg awake and calm   VITAL SIGNS: Temp:  [91.7 F (33.2 C)-98.7 F (37.1 C)] 91.8 F (33.2 C) (08/25 0800) Pulse Rate:  [72-96] 77 (08/25 0800) Resp:  [17-26] 26 (08/25 0800) BP: (118-147)/(59-89) 131/66 mmHg (08/25 0800) SpO2:  [92 %-100 %] 98 % (08/25 0800) FiO2 (%):  [40 %] 40 % (08/25 0400) Weight:  [85.9 kg (189 lb 6 oz)] 85.9 kg (189 lb 6 oz) (08/25 0500) HEMODYNAMICS:   VENTILATOR SETTINGS: Vent Mode:  [-] BIPAP FiO2 (%):   [40 %] 40 % Set Rate:  [12 bmp] 12 bmp PEEP:  [6 cmH20] 6 cmH20 Pressure Support:  [6 cmH20] 6 cmH20 INTAKE / OUTPUT: Intake/Output      08/24 0701 - 08/25 0700 08/25 0701 - 08/26 0700   I.V. (mL/kg) 240 (2.8) 20 (0.2)   NG/GT 210    IV Piggyback 452    Total Intake(mL/kg) 902 (10.5) 20 (0.2)   Urine (mL/kg/hr) 2905 (1.4)    Total Output 2905     Net -2003 +20          PHYSICAL EXAMINATION: General:  Ill looking  man appearing older than stated age Neuro:  RASS 0  follows simple commands, moves all 4s but confused, minimall verbal HEENT: Nasal cannula 6L - 93%. Has unusual head nod  Neck: No JVD  Cardiovascular:  HR 87/minc, no M/R/G Lungs:  Abdomen:  Soft, BS + Musculoskeletal:  No deformities. Skin:  No rashes.  LABS: PULMONARY  Recent Labs Lab 12/12/14 0458 12/12/14 0650 12/12/14 1725 12/13/14 0345 12/16/14 1809  PHART 7.279* 7.305* 7.396 7.473* 7.424  PCO2ART 48.5* 37.1 29.7* 29.3* 43.0  PO2ART 102.0* 110.0* 61.0* 157.0* 50.0*  HCO3 22.5 18.2* 18.2* 21.5 28.1*  TCO2 24 19 19 22 29   O2SAT 96.0 97.0 91.0 100.0 86.0    CBC  Recent Labs Lab 12/15/14 0424 12/16/14 0500 12/17/14 0521  HGB 9.9* 9.6* 10.6*  HCT 29.8* 28.8* 31.8*  WBC 6.8 6.9  7.8  PLT 196 214 281    COAGULATION No results for input(s): INR in the last 168 hours.  CARDIAC  No results for input(s): TROPONINI in the last 168 hours. No results for input(s): PROBNP in the last 168 hours.   CHEMISTRY  Recent Labs Lab 12/13/14 0514  12/14/14 0446 12/14/14 1314 12/14/14 2315 12/15/14 0424 12/15/14 1153 12/16/14 0013 12/16/14 0500 12/17/14 0521  NA 141  --  141  --   --  140  --   --  138 139  K 3.3*  --  3.2*  --   --  3.9  --   --  3.7 4.4  CL 110  --  110  --   --  110  --   --  100* 100*  CO2 24  --  25  --   --  25  --   --  30 31  GLUCOSE 92  --  111*  --   --  108*  --   --  110* 103*  BUN 21*  --  22*  --   --  17  --   --  21* 23*  CREATININE 1.27*  --  0.90  --   --   0.79  --   --  0.86 0.87  CALCIUM 7.8*  --  8.1*  --   --  7.8*  --   --  7.9* 8.6*  MG  --   < > 2.3 2.1 1.9  --  1.8 1.6*  --  2.0  PHOS  --   < > 2.6 1.8* 3.5  --  2.8 2.8  --  3.9  < > = values in this interval not displayed. Estimated Creatinine Clearance: 90.2 mL/min (by C-G formula based on Cr of 0.87).   LIVER  Recent Labs Lab 12/12/14 0415 12/14/14 0446  AST 32 28  ALT 13* 42  ALKPHOS 54 61  BILITOT 1.0 0.5  PROT 6.5 6.0*  ALBUMIN 3.3* 2.3*     INFECTIOUS  Recent Labs Lab 12/12/14 0423  LATICACIDVEN 1.26     ENDOCRINE CBG (last 3)   Recent Labs  12/16/14 1924 12/16/14 2329 12/17/14 0730  GLUCAP 109* 102* 110*         IMAGING x48h  - image(s) personally visualized  -   highlighted in bold Dg Chest Port 1 View  12/16/2014   CLINICAL DATA:  Endotracheal tube position.  EXAM: PORTABLE CHEST - 1 VIEW  COMPARISON:  December 15, 2014.  FINDINGS: Stable cardiomediastinal silhouette. Endotracheal and nasogastric tubes are unchanged in position. Right internal jugular catheter line is again noted with distal tip overlying expected position of the SVC. No pneumothorax is noted. Old right rib fractures are noted. Stable bilateral basilar opacities are noted concerning for pneumonia or atelectasis.  IMPRESSION: Stable support apparatus. Stable bibasilar opacities concerning for pneumonia or atelectasis.   Electronically Signed   By: Marijo Conception, M.D.   On: 12/16/2014 07:14         ASSESSMENT / PLAN:  Active Problems:   Pneumonia   Malnutrition of moderate degree   Pressure ulcer   Acute respiratory failure with hypoxia   Staphylococcal pneumonia   Septic shock   AKI (acute kidney injury)   Respiratory failure   PULMONARY A: Acute Resp Failure  Due to HCAP v Aspiration - intbuated 12/12/14 - 12/16/14    -s/p extubation 12/16/14 . Needed BiPAP overnight. Now on [facility] and looks stable but  CXR with significant infiltrates from pneumonia. Unable to  coooperate with Pulm toilet due to dementia  P:   o2 for pusle ox > 88% Pulm tolet   CARDIOVASCULAR A: Septic Shock>improved , weaned off pressors 8/21 early am  HTN -home meds on hold with clonidine .   - normal BP now   P:   Monitor b/p , keep MAP >65   lasix for volume overload    RENAL A:  Recent Labs Lab 12/13/14 0514 12/14/14 0446 12/15/14 0424 12/16/14 0500 12/17/14 0521  CREATININE 1.27* 0.90 0.79 0.86 0.87     AKI  - rsolved. 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 AWaiting swallow. Likely will fail given progressive dementia  P:   PPI  NPO  Await speech eval  HEMATOLOGIC A:Anemia of critical illness - no obvious bleed P:   Monitor  Hep for DVT prophylaxis  PRBC per protocl  INFECTIOUS  12/12/14 - blood  - corynebacterium - likely contaminant 12/12/14 urine - aerococcus 8./20/16 - MRSA PCR - POSITIVE 12/12/14 - Sputum - MSSA     A: Sepsis of pulmonary origin, HCAP along with UTI. Bacteremia likely contaminant    -   P:   8/20 Ceftazidime ?> 12/14/14 8/20 Vanc  > 8/21, 8/21 >>12/16/14 Clinda 12/13/14 >12/13/14   Unasyn 8./24/16 >> (total 12 days, ICU pharm will touch base with ID pharm)      ENDOCRINE A: No known issues P:   Routine blood glucose checks. Add SSI if BS >180   NEUROLOGIC A: Hx of schizophrenia    - Hx of progressive dementia obtained on 12/17/14 - likely FAST 7A (speak few sentence, swallow eval pending, using walker but declining)  - Currently minimal words - reports this is baseline   -  P:   Check Ammonia given he is on depakote - Dc fent prn If agitated, consider precedex  Holding home meds,  klonopin,rissperdal , nuedexta - 8/25 /16 since admit 12/12/2014 Home meds  back on cogentin, zoloft and depakote as of 12/14/2014 - temp hold pending dysphagia eval Consider restarting slowly  Those tht are not  started   DERM A: RN says no stage 1 ulcer because it blanches - 12/14/2014 P Monitor   OTHERS A - poor IV Access P  - leave CVL in place   GLOBAL A: Baseline progressive dementia likely 7A./B. Deconditioned. Lives in group home P  palliative care consult to discuss goals. ? Qualifies for home hospice given recent sepsis episode and advanced dementia   Keep in ICU 1 more day   BEST PRACTICE / DISPOSITION Level of Care:  ICU Primary Service:  PCCM Consultants:  None Code Status:  Full Diet:  NPO DVT Px:  Heparin GI Px:  PPI  Skin Integrity:  Intact Social / Family: non avaiblable .Ward of state visited him 12/14/2014 .    TODAY'S SUMMARY: 64 y/o man with  HCAP Extuibated 12/16/2014. Calling pall care. Keep in ICU 1 more day due to risk for bipap/intbuation     The patient is critically ill with multiple organ systems failure and requires high complexity decision making for assessment and support, frequent evaluation and titration of therapies, application of advanced monitoring technologies and extensive interpretation of multiple databases.   Critical Care Time devoted to patient care services described in this note is  35  Minutes. This time reflects  time of care of this signee Dr Brand Males. This critical care time does not reflect procedure time, or teaching time or supervisory time of PA/NP/Med student/Med Resident etc but could involve care discussion time    Dr. Brand Males, M.D., [facility].C.P Pulmonary and Critical Care Medicine Staff Physician [place] [facility] Pager: [phone], If no answer or between  15:00h - 7:00h: call [phone]  12/17/2014 9:00 AM

## 2014-12-17 NOTE — Progress Notes (Signed)
Pt guardian came by the unit today with paperwork to change code status. This information was placed in the shadow chart. I also spoke with her about pt baseline mental status. At baseline pt is able to make simple sentences, can feed himself, and alert caregivers when he needs to go to the bathroom (although he cannot clean himself). Caregiver and I both went into the pt room to assess him (pt was not able to state name or where he was) and she stated that this current mental state is more depressed than at baseline.  Guardian also stated that she would fax over the pt's most recent MD notes from the facility.

## 2014-12-17 NOTE — Evaluation (Signed)
Physical Therapy Evaluation Patient Details Name: Kevin Humphrey MRN: [id number] DOB: 04-08-1951 Today's Date: 12/17/2014   History of Present Illness  Pt is a 64 y/o male who is a ward of the state. He has a PMH of schizophrenia, prior PNA, from nursing home. He presents with AMS and was found to have severe sepsis of pulmonary origin with respiratory failure. Was extubated on 8/24. Per chart review, mental status is minimal at baseline. Pt is usually able to state name and put together simple sentences.   Clinical Impression  Pt admitted with above diagnosis. Pt currently with functional limitations due to the deficits listed below (see PT Problem List). At the time of PT eval pt was able to perform transfers to/from EOB with +2 max assist. It is unclear what PLOF is, however it is likely that pt required assistance for basic mobility/ADL's. Pt will benefit from skilled PT to increase their independence and safety with mobility to allow discharge to the venue listed below.       Follow Up Recommendations SNF;Supervision/Assistance - 24 hour    Equipment Recommendations  None recommended by PT    Recommendations for Other Services       Precautions / Restrictions Precautions Precautions: Fall Restrictions Weight Bearing Restrictions: No      Mobility  Bed Mobility Overal bed mobility: Needs Assistance;+2 for physical assistance Bed Mobility: Supine to Sit;Sit to Supine     Supine to sit: Max assist;+2 for physical assistance Sit to supine: Max assist;+2 for physical assistance   General bed mobility comments: Pt required +2 assist for initiation of movement and to complete transition to EOB. Bed pad used for assist.   Transfers                 General transfer comment: OOB mobility not attempted at this time due to difficulty moving LE's at EOB.   Ambulation/Gait                Stairs            Wheelchair Mobility    Modified Rankin (Stroke  Patients Only)       Balance Overall balance assessment: Needs assistance Sitting-balance support: Feet supported;No upper extremity supported Sitting balance-Leahy Scale: Fair Sitting balance - Comments: Initially required +2 assist to maintain balance EOB but was eventually able to progress to static sitting unsupported with hands in lap. Pt anxious at EOB and states "I'm scared".        Standing balance comment: Not assessed                             Pertinent Vitals/Pain Pain Assessment: Faces Pain Score: 0-No pain    Home Living Family/patient expects to be discharged to:: Skilled nursing facility                 Additional Comments: From SNF    Prior Function Level of Independence: Needs assistance         Comments: Unsure of baseline of function. Likely that pt needs assist.      Hand Dominance        Extremity/Trunk Assessment   Upper Extremity Assessment: Defer to OT evaluation           Lower Extremity Assessment: Generalized weakness (Not able to MMT. Minimal AROM noted. )      Cervical / Trunk Assessment: Normal  Communication   Communication: Expressive difficulties  Cognition Arousal/Alertness:  Awake/alert Behavior During Therapy: [facility] for tasks assessed/performed;Anxious Overall Cognitive Status: Impaired/Different from baseline Area of Impairment: Orientation;Following commands Orientation Level: Disoriented to;Person;Place;Time;Situation   Memory: Decreased short-term memory Following Commands: Follows one step commands inconsistently;Follows one step commands with increased time       General Comments: Pt with cognitive impairments at baseline, however was not able to tell me any information during session. Answered yes/no questions only and makes short statements occasionally.     General Comments      Exercises        Assessment/Plan    PT Assessment Patient needs continued PT services  PT Diagnosis  Difficulty walking;Generalized weakness   PT Problem List Decreased strength;Decreased range of motion;Decreased activity tolerance;Decreased balance;Decreased mobility;Decreased knowledge of use of DME;Decreased safety awareness;Decreased knowledge of precautions;Cardiopulmonary status limiting activity  PT Treatment Interventions DME instruction;Gait training;Stair training;Therapeutic activities;Functional mobility training;Therapeutic exercise;Neuromuscular re-education;Patient/family education   PT Goals (Current goals can be found in the Care Plan section) Acute Rehab PT Goals PT Goal Formulation: Patient unable to participate in goal setting Time For Goal Achievement: 12/31/14 Potential to Achieve Goals: Fair    Frequency Min 2X/week   Barriers to discharge        Co-evaluation               End of Session Equipment Utilized During Treatment: Oxygen Activity Tolerance: Patient limited by fatigue Patient left: in bed;with call bell/phone within reach;with bed alarm set Nurse Communication: Mobility status         Time: 2878-6767 PT Time Calculation (min) (ACUTE ONLY): 18 min   Charges:   PT Evaluation $Initial PT Evaluation Tier I: 1 Procedure     PT G CodesRolinda Roan 2014-12-21, 9:58 AM   Rolinda Roan, PT, DPT Acute Rehabilitation Services Pager: [phone]

## 2014-12-17 NOTE — Consult Note (Signed)
Consultation Note Date: 12/17/2014   Patient Name: Kevin Humphrey  DOB: 1950/05/13  MRN: [id number]  Age / Sex: 64 y.o., male   PCP: Ricard Dillon, MD Referring Physician: Brand Males, MD  Reason for Consultation: [place] Assessment and Plan Summary of Established Goals of Care and Medical Treatment Preferences    Palliative Care Discussion Held Today:   Kevin Humphrey is alert but does not verbalize anything to me (although appears to attempt to speak at times). He will nod head yes/no in response. He also follows commands but lifting arm, squeezing hand, wiggling toes. He denies pain, discomfort, or that he needs anything. He tracks and is attentive to me as I speak to him. Concern about his dysphagia and SLP will continue to follow for improvement. I am unsure about the extent of his dementia and his baseline but with dysphagia and what he has been through very concerned for poor prognosis. I reassured him that he was in a safe place with numerous people/nurses helping to keep him safe and look out for him. I have left a message with legal guardian Blenda Mounts to gain more insight into baseline and further discuss [place].    Contacts/Participants in Discussion: Primary Decision Maker: Kevin Humphrey  Goals of Care/Code Status/Advance Care Planning:   Code Status: FULL   Symptom Management:   Dysphagia: SLP following for recommendations.   Psycho-social/Spiritual:   Support System: No family that I am aware of. From chart he lives in group home.   Prognosis: Poor  Discharge Planning: To be determined       Chief Complaint/HPI: 64 yo male with hx of schizophrenia, prior PNA, with AMS found to have severe sepsis of pulmonary origin with respiratory failure requiring intubation and mechanical ventilation d/t HCAP vs aspiration (reportedly with significant dementia and awaiting swallow study).   Primary Diagnoses  Present on Admission:  .  Pneumonia  Palliative Review of Systems:   Nods head no to pain/discomfort.    I have reviewed the medical record, interviewed the patient and family, and examined the patient. The following aspects are pertinent.  Past Medical History  Diagnosis Date  . GERD (gastroesophageal reflux disease) 08/02/2012  . Dysphagia, unspecified 08/02/2012  . Allergic rhinitis 08/02/2012  . Vertebral compression fracture 08/02/2012  . Other and unspecified hyperlipidemia 08/02/2012  . Schizophrenia 08/02/2012  . Depression 08/02/2012   Social History   Social History  . Marital Status: Unknown    Spouse Name: N/A  . Number of Children: N/A  . Years of Education: N/A   Social History Main Topics  . Smoking status: Former Research scientist (life sciences)  . Smokeless tobacco: None  . Alcohol Use: None  . Drug Use: None  . Sexual Activity: Not Asked   Other Topics Concern  . None   Social History Narrative   History reviewed. No pertinent family history. Scheduled Meds: . ampicillin-sulbactam (UNASYN) IV  3 g Intravenous Q8H  . antiseptic oral rinse  7 mL Mouth Rinse BID  . benztropine  1 mg Oral BID  . Chlorhexidine Gluconate Cloth  6 each Topical Q0600  . famotidine  20 mg Oral Daily  . furosemide  20 mg Intravenous TID  . heparin  5,000 Units Subcutaneous 3 times per day  . mupirocin ointment  1 application Nasal BID  . potassium chloride  20 mEq Oral BID  . sertraline  150 mg Oral Daily  . Valproic Acid  125 mg Oral BID   Continuous Infusions:  PRN Meds:.sodium chloride, midazolam Medications Prior to Admission:  Prior to Admission medications   Medication Sig Start Date End Date Taking? Authorizing Provider  benztropine (COGENTIN) 2 MG tablet Take 1 mg by mouth 2 (two) times daily.    Yes Historical Provider, MD  clonazePAM (KLONOPIN) 0.5 MG tablet Take one tablet by mouth twice daily for anxiety; Take two tablets by mouth every night at bedtime for anxiety; Take one tablet by mouth once daily as needed  for breakthrough pain (per fax Rx) Patient taking differently: Take 0.5-1 mg by mouth 3 (three) times daily. 0.5 mg at 0800 and 1200 and 1 mg at 2100. 08/10/14  Yes Tiffany L Reed, DO  cloNIDine (CATAPRES) 0.1 MG tablet Take 0.1 mg by mouth at bedtime.   Yes Historical Provider, MD  Dextromethorphan-Quinidine (NUEDEXTA) 20-10 MG CAPS Take 1 tablet by mouth 2 (two) times daily.   Yes Historical Provider, MD  divalproex (DEPAKOTE SPRINKLE) 125 MG capsule Take 125 mg by mouth 2 (two) times daily.   Yes Historical Provider, MD  famotidine (PEPCID) 20 MG tablet Take 20 mg by mouth daily.    Yes Historical Provider, MD  hydrochlorothiazide (HYDRODIURIL) 25 MG tablet Take 12.5 mg by mouth daily.   Yes Historical Provider, MD  lactose free nutrition (BOOST) LIQD Take 237 mLs by mouth 2 (two) times daily between meals.   Yes Historical Provider, MD  lactulose, encephalopathy, (ENULOSE) 10 GM/15ML SOLN Take 20 g by mouth 3 (three) times daily.   Yes Historical Provider, MD  Multiple Vitamin (MULTIVITAMIN) tablet Take 1 tablet by mouth daily.   Yes Historical Provider, MD  risperiDONE (RISPERDAL) 2 MG tablet Take 4 mg by mouth 2 (two) times daily. Takes 2 mg in the am and 4 mg in the pm   Yes Historical Provider, MD  sertraline (ZOLOFT) 100 MG tablet Take 150 mg by mouth daily.    Yes Historical Provider, MD  triamcinolone (NASACORT) 55 MCG/ACT nasal inhaler Place 2 sprays into the nose 2 (two) times daily.   Yes Historical Provider, MD   No Known Allergies CBC:    Component Value Date/Time   WBC 7.8 12/17/2014 0521   HGB 10.6* 12/17/2014 0521   HCT 31.8* 12/17/2014 0521   PLT 281 12/17/2014 0521   MCV 90.6 12/17/2014 0521   NEUTROABS 3.9 12/17/2014 0521   LYMPHSABS 1.3 12/17/2014 0521   MONOABS 2.3* 12/17/2014 0521   EOSABS 0.3 12/17/2014 0521   BASOSABS 0.0 12/17/2014 0521   Comprehensive Metabolic Panel:    Component Value Date/Time   NA 139 12/17/2014 0521   K 4.4 12/17/2014 0521   CL 100*  12/17/2014 0521   CO2 31 12/17/2014 0521   BUN 23* 12/17/2014 0521   CREATININE 0.87 12/17/2014 0521   GLUCOSE 103* 12/17/2014 0521   CALCIUM 8.6* 12/17/2014 0521   AST 28 12/14/2014 0446   ALT 42 12/14/2014 0446   ALKPHOS 61 12/14/2014 0446   BILITOT 0.5 12/14/2014 0446   PROT 6.0* 12/14/2014 0446   ALBUMIN 2.3* 12/14/2014 0446    Physical Exam:  Vital Signs: BP 140/74 mmHg  Pulse 83  Temp(Src) 99.1 F (37.3 C) (Oral)  Resp 20  Ht 5\' 11"  (1.803 m)  Wt 85.9 kg (189 lb 6 oz)  BMI 26.42 kg/m2  SpO2 90% SpO2: SpO2: 90 % O2 Device: O2 Device: Nasal Cannula O2 Flow Rate: O2 Flow Rate (L/min): 6 L/min Intake/output summary:  Intake/Output Summary (Last 24 hours) at 12/17/14 1446 Last data filed  at 12/17/14 1100  Gross per 24 hour  Intake    542 ml  Output   2085 ml  Net  -1543 ml   LBM: Last BM Date: 12/16/14 Baseline Weight: Weight: 95.709 kg (211 lb) Most recent weight: Weight: 85.9 kg (189 lb 6 oz)  Exam Findings:   General: NAD, lying in bed HEENT: [place]/AT, moist mucous membranes CVS: RRR Resp: No labored breathing Abd: Soft, NT, ND Extrem: Warm, dry Neuro: Awake, alert, difficult to assess orientation when not verbal           Palliative Performance Scale: 30%                Additional Data Reviewed: Recent Labs     12/16/14  0500  12/17/14  0521  WBC  6.9  7.8  HGB  9.6*  10.6*  PLT  214  281  NA  138  139  BUN  21*  23*  CREATININE  0.86  0.87     Time In: 1420 Time Out: 1510 Time Total: 42min  Greater than 50%  of this time was spent counseling and coordinating care related to the above assessment and plan.   Signed by:  Vinie Sill, NP Palliative Medicine Team Pager # [phone] (M-F 8a-5p) Team Phone # [phone] (Nights/Weekends)

## 2014-12-17 NOTE — Progress Notes (Signed)
Pt taken off Bipap at this time and placed on 6L [place].  RT will continue to monitor.

## 2014-12-17 NOTE — Clinical Social Work Note (Signed)
Clinical Social Work Assessment  Patient Details  Name: Kevin Humphrey MRN: [id number] Date of Birth: 1951-02-25  Date of referral:  12/17/14               Reason for consult:  Discharge Planning                Permission sought to share information with:  Case Manager, Family Supports, Customer service manager Permission granted to share information::  Yes, Verbal Permission Granted  Name::      ([place])  Agency::   [facility] )  Relationship::   (Legal Guardian )  Contact Information:   [phone])  Housing/Transportation Living arrangements for the past 2 months:  [place] of Information:  Guardian Patient Interpreter Needed:  None Criminal Activity/Legal Involvement Pertinent to Current Situation/Hospitalization:  No - Comment as needed Significant Relationships:  None Lives with:  Facility Resident Do you feel safe going back to the place where you live?  Yes Need for family participation in patient care:  No (Coment)  Care giving concerns:  Extended Care.   Social Worker assessment / plan:  Holiday representative contacted patient's legal guardian, Blenda Mounts in reference to patient being admitted from facility. CSW introduced CSW role. Pt's legal guardian confirmed that patient is a long-term resident of [facility] and will return at discharge. Pt's guardian stated she has been visiting pt at bedside and was concerned about his current condition and decreased mental status. CSW shared updates (Pt extubated on 8/24 and placed on BI-PAP, tube feedings are continued, +MRSA) and informed guardian that code status would need to be discussed with medical staff. Guardian stated she would need to discuss code status decisions with supervisor, Malachy Mood.  Guardian stated she will contact CSW once case is discussed with supervisor and will also pass along this CSW's contact information. No further concerns reported by guardian at this time. CSW  to update FL-2 as needed. CSW will continue to follow pt for continued support and to facilitate pt's discharge needs once medically stable.    Employment status:  Disabled (Comment on whether or not currently receiving Disability) Insurance information:  Managed Medicare PT Recommendations:  Not assessed at this time Information / Referral to community resources:  [place]  Patient/Family's Response to care: Pt oriented to person only. Pt's guardian agreeable to pt returning to [place] once medically stable. Guardian to discuss case with supervisor and contact CSW as needed.   Patient/Family's Understanding of and Emotional Response to Diagnosis, Current Treatment, and Prognosis:  Guardian involved in pt's care and has been supportive visiting pt at [facility]. CSW remains available as needed.   Emotional Assessment Appearance:  Appears older than stated age Attitude/Demeanor/Rapport:  Unable to Assess Affect (typically observed):  Unable to Assess Orientation:  Oriented to Self Alcohol / Substance use:  Not Applicable Psych involvement (Current and /or in the community):  No (Comment)  Discharge Needs  Concerns to be addressed:  Decision making concerns, Care Coordination Readmission within the last 30 days:  No Current discharge risk:  Cognitively Impaired, Lack of support system Barriers to Discharge:  Continued Medical Work up   [employer], MSW, LCSWA [phone] 12/17/2014 9:57 AM

## 2014-12-17 NOTE — Progress Notes (Signed)
MBSS complete. Full report located under chart review in imaging section. Ryley Teater, MA CCC-SLP [phone]  

## 2014-12-18 DIAGNOSIS — R1310 Dysphagia, unspecified: Secondary | ICD-10-CM

## 2014-12-18 LAB — GLUCOSE, CAPILLARY
GLUCOSE-CAPILLARY: 116 mg/dL — AB (ref 65–99)
Glucose-Capillary: 108 mg/dL — ABNORMAL HIGH (ref 65–99)
Glucose-Capillary: 103 mg/dL — ABNORMAL HIGH (ref 65–99)

## 2014-12-18 LAB — BASIC METABOLIC PANEL
ANION GAP: 9 (ref 5–15)
BUN: 29 mg/dL — ABNORMAL HIGH (ref 6–20)
CHLORIDE: 100 mmol/L — AB (ref 101–111)
CO2: 31 mmol/L (ref 22–32)
CREATININE: 0.92 mg/dL (ref 0.61–1.24)
Calcium: 8.7 mg/dL — ABNORMAL LOW (ref 8.9–10.3)
GFR calc non Af Amer: >60 mL/min (ref >60)
Glucose, Bld: 108 mg/dL — ABNORMAL HIGH (ref 65–99)
POTASSIUM: 4.4 mmol/L (ref 3.5–5.1)
SODIUM: 140 mmol/L (ref 135–145)

## 2014-12-18 LAB — CBC WITH DIFFERENTIAL/PLATELET
BASOS ABS: 0 10*3/uL (ref 0.0–0.1)
BASOS PCT: 0 % (ref 0–1)
EOS ABS: 0.2 10*3/uL (ref 0.0–0.7)
Eosinophils Relative: 2 % (ref 0–5)
HEMATOCRIT: 32 % — AB (ref 39.0–52.0)
HEMOGLOBIN: 10.6 g/dL — AB (ref 13.0–17.0)
Lymphocytes Relative: 19 % (ref 12–46)
Lymphs Abs: 1.9 10*3/uL (ref 0.7–4.0)
MCH: 29.8 pg (ref 26.0–34.0)
MCHC: 33.1 g/dL (ref 30.0–36.0)
MCV: 89.9 fL (ref 78.0–100.0)
MONOS PCT: 21 % — AB (ref 3–12)
Monocytes Absolute: 2 10*3/uL — ABNORMAL HIGH (ref 0.1–1.0)
NEUTROS ABS: 5.6 10*3/uL (ref 1.7–7.7)
NEUTROS PCT: 58 % (ref 43–77)
Platelets: 373 10*3/uL (ref 150–400)
RBC: 3.56 MIL/uL — AB (ref 4.22–5.81)
RDW: 14.2 % (ref 11.5–15.5)
WBC: 9.7 10*3/uL (ref 4.0–10.5)

## 2014-12-18 LAB — PHOSPHORUS: PHOSPHORUS: 3.9 mg/dL (ref 2.5–4.6)

## 2014-12-18 LAB — AMMONIA: AMMONIA: 38 umol/L — AB (ref 9–35)

## 2014-12-18 LAB — MAGNESIUM: MAGNESIUM: 2.2 mg/dL (ref 1.7–2.4)

## 2014-12-18 MED ORDER — STARCH (THICKENING) PO POWD
ORAL | Status: DC | PRN
  Filled 2014-12-18: qty 227

## 2014-12-18 MED ORDER — ACETAMINOPHEN 325 MG PO TABS
650.0000 mg | ORAL_TABLET | Freq: Four times a day (QID) | ORAL | Status: DC | PRN
  Administered 2014-12-19: 650 mg via ORAL
  Filled 2014-12-18 (×3): qty 2

## 2014-12-18 MED ORDER — HYDRALAZINE HCL 20 MG/ML IJ SOLN
10.0000 mg | INTRAMUSCULAR | Status: DC | PRN
  Administered 2014-12-18 – 2014-12-24 (×4): 10 mg via INTRAVENOUS
  Filled 2014-12-18 (×4): qty 1

## 2014-12-18 MED ORDER — POTASSIUM CHLORIDE 20 MEQ/15ML (10%) PO SOLN
20.0000 meq | Freq: Every day | ORAL | Status: DC
  Administered 2014-12-18 – 2014-12-23 (×6): 20 meq via ORAL
  Filled 2014-12-18 (×7): qty 15

## 2014-12-18 MED ORDER — CETYLPYRIDINIUM CHLORIDE 0.05 % MT LIQD: 7.0000 mL | OROMUCOSAL | Status: DC | PRN

## 2014-12-18 MED ORDER — FUROSEMIDE 10 MG/ML IJ SOLN
20.0000 mg | Freq: Every day | INTRAMUSCULAR | Status: DC
  Administered 2014-12-18 – 2014-12-19 (×2): 20 mg via INTRAVENOUS
  Filled 2014-12-18: qty 2

## 2014-12-18 MED ORDER — INSULIN ASPART 100 UNIT/ML ~~LOC~~ SOLN
5.0000 [IU] | Freq: Once | SUBCUTANEOUS | Status: DC
Start: 2014-12-18 — End: 2014-12-18

## 2014-12-18 MED ORDER — ENOXAPARIN SODIUM 40 MG/0.4ML ~~LOC~~ SOLN
40.0000 mg | SUBCUTANEOUS | Status: DC
  Administered 2014-12-18 – 2014-12-24 (×7): 40 mg via SUBCUTANEOUS
  Filled 2014-12-18 (×8): qty 0.4

## 2014-12-18 NOTE — Progress Notes (Signed)
Daily Progress Note   Patient Name: Kevin Humphrey       Date: 12/18/2014 DOB: [birth year]  Age: 64 y.o. MRN#: [id number] Attending Physician: Brand Males, MD Primary Care Physician: [name], MD Admit Date: 12/12/2014  Reason for Consultation/Follow-up: GOC  Subjective:     I have left 2 messages with DSS worker Ms. [place] and she called me back but I was with another patient/family. I have worked on paperwork and left in chart - need to have conversation with Ms. [place] and have paperwork notarized (along with attending). In meantime - DNR is not confirmed with legal decision maker (likely will not be done this weekend). If acute decline may need to consider 2 physicians for decision makers.   [name] was complaining of stomach issues - finally concluded he was having BM. He also shook head "yes" to pain and when asked where he was able to say "head." Reassurance provided and nurses at bedside to help.    Length of Stay: 6 days  Current Medications: Scheduled Meds:  . ampicillin-sulbactam (UNASYN) IV  3 g Intravenous Q8H  . benztropine  1 mg Oral BID  . enoxaparin (LOVENOX) injection  40 mg Subcutaneous Q24H  . famotidine  20 mg Oral Daily  . furosemide  20 mg Intravenous Daily  . mupirocin ointment  1 application Nasal BID  . potassium chloride  20 mEq Oral Daily  . sertraline  150 mg Oral Daily  . Valproic Acid  125 mg Oral BID    Continuous Infusions:    PRN Meds: sodium chloride, acetaminophen, antiseptic oral rinse, food thickener, hydrALAZINE  Palliative Performance Scale: 20%     Vital Signs: BP 140/74 mmHg  Pulse 88  Temp(Src) 100.2 F (37.9 C) (Axillary)  Resp 17  Ht 5\' 11"  (1.803 m)  Wt 83.8 kg (184 lb 11.9 oz)  BMI 25.78 kg/m2  SpO2 98% SpO2: SpO2: 98 % O2 Device: O2 Device: Bi-PAP O2 Flow Rate: O2 Flow Rate (L/min): 4 L/min  Intake/output summary:  Intake/Output Summary (Last 24 hours) at 12/18/14 1756 Last data filed at  12/18/14 1700  Gross per 24 hour  Intake    720 ml  Output   1660 ml  Net   -940 ml   LBM: Last BM Date: 12/17/14 Baseline Weight: Weight: 95.709 kg (211 lb) Most recent weight: Weight: 83.8 kg (184 lb 11.9 oz)  Physical Exam: General: NAD, lying in bed HEENT: [place]/AT, moist mucous membranes CVS: RRR Resp: No labored breathing Abd: Soft, NT, ND Extrem: Warm, dry Neuro: Awake, alert, difficult to assess orientation when not verbal often   Additional Data Reviewed: Recent Labs     12/17/14  0521  12/18/14  0550  WBC  7.8  9.7  HGB  10.6*  10.6*  PLT  281  373  NA  139  140  BUN  23*  29*  CREATININE  0.87  0.92     Problem List:  Patient Active Problem List   Diagnosis Date Noted  . Palliative care encounter 12/17/2014  . Dysphagia 12/17/2014  . Acute respiratory failure with hypoxia 12/14/2014  . Staphylococcal pneumonia 12/14/2014  . Septic shock 12/14/2014  . AKI (acute kidney injury) 12/14/2014  . Respiratory failure   . Pneumonia 12/12/2014  . Malnutrition of moderate degree 12/12/2014  . Pressure ulcer 12/12/2014  . Convulsions/seizures 08/20/2013  . Other and unspecified hyperlipidemia 08/02/2012  . GERD (gastroesophageal reflux disease) 08/02/2012  . Schizophrenia 08/02/2012  . Depression  08/02/2012  . Allergic rhinitis 08/02/2012  . Dysphagia, unspecified(787.20) 08/02/2012  . Vertebral compression fracture 08/02/2012     Palliative Care Assessment & Plan    Code Status:  FULL - would recommend DNR  Goals of Care:  To be further determined with conversation with legal guardian  3. Symptom Management:  Dysphagia: SLP following for recommendations.   Prognosis: Poor  Discharge Planning: To be determined   Thank you for allowing the Palliative Medicine Team to assist in the care of this patient.   Time In: 1610 Time Out: 1630 Total Time 29min Prolonged Time Billed  no     Greater than 50%  of this time was spent counseling and  coordinating care related to the above assessment and plan.  Vinie Sill, NP Palliative Medicine Team Pager # [phone] (M-F 8a-5p) Team Phone # [phone] (Nights/Weekends)  12/18/2014, 5:56 PM

## 2014-12-18 NOTE — Progress Notes (Signed)
Speech Language Pathology Treatment: Dysphagia  Patient Details Name: Kevin Humphrey Drummer MRN: [id number] DOB: February 18, 1951 Today's Date: 12/18/2014 Time: 0930-1050 SLP Time Calculation (min) (ACUTE ONLY): 80 min  Assessment / Plan / Recommendation Clinical Impression  Pt tolerating nectar thick liquids without signs of aspiration. Pt repositioned and provided external pacing for appropriate sip size. Pt did accept small bites of cracker with adequate mastication. Will upgrade to Dys 2 for a more palatable diet. Will f/u for tolerance.    HPI Other Pertinent Information: Pt. has a PMH of schizophrenia, sepsis, respiratory failure, dysphagia, PNA, and GERD. Pt. was admitted when he was found in an AMS and gargling when breathing; pt has aspiration pna (per [facility] report). Pt was intubated in the ED on 12/12/14 and extubated on 12/16/14 (10am). Pt. referred to speech for BSE, prior to admission he was scheduled for a swallow evaluation at SNF.    Pertinent Vitals    SLP Plan  Continue with current plan of care    Recommendations Diet recommendations: Dysphagia 2 (fine chop);Nectar-thick liquid Liquids provided via: Straw Medication Administration: Crushed with puree Supervision: Staff to assist with self feeding Compensations: Small sips/bites;Slow rate Postural Changes and/or Swallow Maneuvers: Seated upright 90 degrees              Oral Care Recommendations: Oral care BID Follow up Recommendations: Skilled Nursing facility Plan: Continue with current plan of care    GO    [facility], MA CCC-SLP [phone]  Lynann Beaver 12/18/2014, 10:11 AM

## 2014-12-18 NOTE — Clinical Social Work Note (Signed)
Clinical Social Worker has contacted [facility] admission and left message for admissions coordinator to review patient's FL-2 and clinicals in [place].   CSW will continue to follow patient for continued support and to facilitate pt's discharge needs once medically stable.  Glendon Axe, MSW, LCSWA [phone] 12/18/2014 10:30 AM

## 2014-12-18 NOTE — Progress Notes (Signed)
Nutrition Follow-up  DOCUMENTATION CODES:   Non-severe (moderate) malnutrition in context of chronic illness  INTERVENTION:    Pudding TID with meals to maximize oral intake of calories and protein  NUTRITION DIAGNOSIS:   Inadequate oral intake related to dysphagia as evidenced by meal completion < 50%.  Ongoing  GOAL:   Patient will meet greater than or equal to 90% of their needs  Unmet  MONITOR:   PO intake, Supplement acceptance, Labs, Weight trends  ASSESSMENT:   64 y/o man [place] w/ hx of schizophrenia, prior PNA, p/w AMS found to have severe sepsis of pulmonary origin with respiratory failure.  Patient was extubated on 8/24. SLP following for dysphagia. Diet has been upgraded to dysphagia 2 with nectar thick liquids. Patient consumed < 25% of breakfast this AM per nurse tech; he did not like the Magic cup ice cream and had a few bites of grits, liked the apple juice.   Diet Order:  DIET - DYS 1 Room service appropriate?: Yes; Fluid consistency:: Nectar Thick  Skin:  Reviewed, no issues  Last BM:  8/25  Height:   Ht Readings from Last 1 Encounters:  12/12/14 5\' 11"  (1.803 m)    Weight:   Wt Readings from Last 1 Encounters:  12/18/14 184 lb 11.9 oz (83.8 kg)    Ideal Body Weight:  78.18 kg  BMI:  Body mass index is 25.78 kg/(m^2).  Estimated Nutritional Needs:   Kcal:  2000-2200  Protein:  100-115 gm  Fluid:  2-2.2 L  EDUCATION NEEDS:   No education needs identified at this time  Molli Barrows, [place], [place], [place] Pager [phone] After Hours Pager [phone]

## 2014-12-18 NOTE — Progress Notes (Signed)
[facility] Progress Note Patient Name: Joshu Furukawa DOB: 04-25-1950 MRN: [id number]   Date of Service  12/18/2014  HPI/Events of Note  Multiple issues: 1. Headache, 2. Fever to 100.6 F and 3. HTN - BP = 170/84.  eICU Interventions  Will order: 1. Tylenol 650 mg PO Q 6 hours PRN Temp > 101.5 F or HA. 2. Hydralazine 10 mg IV Q 4 hours PRN SBP > 170 or DBP > 100.     Intervention Category Intermediate Interventions: Hypertension - evaluation and management;Infection - evaluation and management;Pain - evaluation and management  Jianni Shelden Eugene 12/18/2014, 4:22 PM

## 2014-12-18 NOTE — Progress Notes (Signed)
PULMONARY / CRITICAL CARE MEDICINE HISTORY AND PHYSICAL EXAMINATION   Name: Kevin Humphrey MRN: [id number] DOB: 1950-12-05    ADMISSION DATE:  12/12/2014  PRIMARY SERVICE: PCCM  CHIEF COMPLAINT:  AMS  BRIEF PATIENT DESCRIPTION: 64 y/o man ([place]) w/ hx of schizophrenia, prior PNA, p/w AMS found to have severe sepsis of pulmonary origin with respiratory failure.   LINES / TUBES: CVC RIJ (placed by ED) - 12/12/14   SIGNIFICANT EVENTS / STUDIES:  Intubated in ED CT head 8/20 >neg   12/13/14:  Weaned off pressors overnight.  Sedated on vent , awakens but does not follow commands  12/14/14: Still on vent. Off pressors since yesterday. Psych meds being restarted. On fent gtt -> RASS -2, follows some commands. Moves all 4s. Failed SBT x 2. MRSA PCR +  12/15/14: Off pressors. Did do SBT for 2-3h but now failing. Hypoactive delirium +. Rt IJ site bruise as opposed to erythemia. CXR still wet. Cultures resp with MSSA and blood with GPR  12/16/14 =- different microbes from different sources. Pharm recommends current abx for now and then narrow to Unasyn depending on blood sensit. Doing SBT.  Having lot of resp secretions - thick and tan. On Lasix  12/17/2014 :  Needed biPAP overnight. Now on 6L [place]. Unclear if resp distres or head nod. RN says - DPOA attests to significant dementia. Patient had swallow eval pending at group home and speaks only minimal sentences. Currently very difficyult for him to follow command thouhg awake and calm    SUBJECTIVE/OVERNIGHT/INTERVAL HX 12/18/2014 : needed bipap again overnight. Now at 4L [place]. Failed swalllow - D1 diet. Per RN - palliative care and social work and [place] working on goals -> reporteldy DNR but paper work needs to be formalized. Confirmed on chart review   VITAL SIGNS: Temp:  [95.3 F (35.2 C)-99.4 F (37.4 C)] 99.2 F (37.3 C) (08/26 0819) Pulse Rate:  [79-91] 91 (08/26 0819) Resp:  [14-27] 14 (08/26 0819) BP: (118-174)/(70-113) 155/81  mmHg (08/26 0819) SpO2:  [90 %-98 %] 98 % (08/26 0819) FiO2 (%):  [50 %] 50 % (08/26 0400) Weight:  [83.8 kg (184 lb 11.9 oz)] 83.8 kg (184 lb 11.9 oz) (08/26 0400) HEMODYNAMICS:   VENTILATOR SETTINGS: Vent Mode:  [-] BIPAP FiO2 (%):  [50 %] 50 % Set Rate:  [12 bmp] 12 bmp PEEP:  [6 cmH20-8 cmH20] 8 cmH20 Pressure Support:  [6 cmH20-8 cmH20] 8 cmH20 INTAKE / OUTPUT: Intake/Output      08/25 0701 - 08/26 0700 08/26 0701 - 08/27 0700   I.V. (mL/kg) 230 (2.7)    Other 30    NG/GT     IV Piggyback 200    Total Intake(mL/kg) 460 (5.5)    Urine (mL/kg/hr) 1965 (1)    Stool 0 (0)    Total Output 1965     Net -1505          Stool Occurrence 1 x      PHYSICAL EXAMINATION: General:  Ill looking  man appearing older than stated age Neuro:  RASS 0  follows simple commands, moves all 4s but confused, minimall verbal HEENT: Nasal cannula 4L - 93%. Has unusual head nod . Watching TV Neck: No JVD  Cardiovascular:  HR 94/minc, no M/R/G Lungs:  Abdomen:  Soft, BS + Musculoskeletal:  No deformities. Skin:  No rashes.  LABS: PULMONARY  Recent Labs Lab 12/12/14 0458 12/12/14 0650 12/12/14 1725 12/13/14 0345 12/16/14 1809  PHART 7.279* 7.305* 7.396  7.473* 7.424  PCO2ART 48.5* 37.1 29.7* 29.3* 43.0  PO2ART 102.0* 110.0* 61.0* 157.0* 50.0*  HCO3 22.5 18.2* 18.2* 21.5 28.1*  TCO2 24 19 19 22 29   O2SAT 96.0 97.0 91.0 100.0 86.0    CBC  Recent Labs Lab 12/16/14 0500 12/17/14 0521 12/18/14 0550  HGB 9.6* 10.6* 10.6*  HCT 28.8* 31.8* 32.0*  WBC 6.9 7.8 9.7  PLT 214 281 373    COAGULATION No results for input(s): INR in the last 168 hours.  CARDIAC  No results for input(s): TROPONINI in the last 168 hours. No results for input(s): PROBNP in the last 168 hours.   CHEMISTRY  Recent Labs Lab 12/14/14 0446  12/14/14 2315 12/15/14 0424 12/15/14 1153 12/16/14 0013 12/16/14 0500 12/17/14 0521 12/18/14 0550  NA 141  --   --  140  --   --  138 139 140  K 3.2*  --    --  3.9  --   --  3.7 4.4 4.4  CL 110  --   --  110  --   --  100* 100* 100*  CO2 25  --   --  25  --   --  30 31 31   GLUCOSE 111*  --   --  108*  --   --  110* 103* 108*  BUN 22*  --   --  17  --   --  21* 23* 29*  CREATININE 0.90  --   --  0.79  --   --  0.86 0.87 0.92  CALCIUM 8.1*  --   --  7.8*  --   --  7.9* 8.6* 8.7*  MG 2.3  < > 1.9  --  1.8 1.6*  --  2.0 2.2  PHOS 2.6  < > 3.5  --  2.8 2.8  --  3.9 3.9  < > = values in this interval not displayed. Estimated Creatinine Clearance: 85.3 mL/min (by C-G formula based on Cr of 0.92).   LIVER  Recent Labs Lab 12/12/14 0415 12/14/14 0446  AST 32 28  ALT 13* 42  ALKPHOS 54 61  BILITOT 1.0 0.5  PROT 6.5 6.0*  ALBUMIN 3.3* 2.3*     INFECTIOUS  Recent Labs Lab 12/12/14 0423  LATICACIDVEN 1.26     ENDOCRINE CBG (last 3)   Recent Labs  12/17/14 1931 12/17/14 2328 12/18/14 0418  GLUCAP 129* 108* 103*         IMAGING x48h  - imDg Swallowing Func-speech Pathology  12/17/2014    Objective Swallowing Evaluation:    Patient Details  Name: Kevin Humphrey MRN: [id number] Date of Birth: [birth year]  Today's Date: 12/17/2014 Time: SLP Start Time (ACUTE ONLY): 1130-SLP Stop Time (ACUTE ONLY): 1200 SLP Time Calculation (min) (ACUTE ONLY): 30 min  Past Medical History:  Past Medical History  Diagnosis Date  . GERD (gastroesophageal reflux disease) 08/02/2012  . Dysphagia, unspecified 08/02/2012  . Allergic rhinitis 08/02/2012  . Vertebral compression fracture 08/02/2012  . Other and unspecified hyperlipidemia 08/02/2012  . Schizophrenia 08/02/2012  . Depression 08/02/2012   Past Surgical History: No past surgical history on file. HPI:  Other Pertinent Information: Pt. has a PMH of schizophrenia, sepsis,  respiratory failure, dysphagia, PNA, and GERD. Pt. was admitted when he  was found in an AMS and gargling when breathing; pt has aspiration pna  (per [facility] report). Pt was intubated in the ED on 12/12/14 and extubated on  12/16/14 (10am). Pt.  referred to speech for  BSE, prior to admission he was  scheduled for a swallow evaluation at SNF.   No Data Recorded  Assessment / Plan / Recommendation CHL IP CLINICAL IMPRESSIONS 12/17/2014  Therapy Diagnosis Moderate oral phase dysphagia;Moderate pharyngeal phase  dysphagia  Clinical Impression Pt demonstrates a moderate oral dysphagia due to  inability/unwillngness to masticate solids textures during exam with  anterior oral holding with removal of bolus. Oropharyngeal phase with  moderate to severe impairment. There is a slight delay in swallow  initiation and slightly decreased laryngeal closure with trace penetration  of nectar thick liquids and aspiration of thin liquids with very late  sensation. Recommend pt consume nectar thick liquids and pureed solids; pt  may be able to upgrade solids texture if motivated by food. Unclear  whether oropharyngeal impairment is present at baseline or acutely  impaired by recent intubation. Will follow for tolerance and further  trials.       CHL IP TREATMENT RECOMMENDATION 12/17/2014  Treatment Recommendations Therapy as outlined in treatment plan below     CHL IP DIET RECOMMENDATION 12/17/2014  SLP Diet Recommendations Dysphagia 1 (Puree);Nectar  Liquid Administration via (None)  Medication Administration Crushed with puree  Compensations Small sips/bites;Slow rate  Postural Changes and/or Swallow Maneuvers (None)     CHL IP OTHER RECOMMENDATIONS 12/17/2014  Recommended Consults (None)  Oral Care Recommendations Oral care BID  Other Recommendations Order thickener from pharmacy     No flowsheet data found.   CHL IP FREQUENCY AND DURATION 12/17/2014  Speech Therapy Frequency (ACUTE ONLY) min 2x/week  Treatment Duration 2 weeks     Pertinent Vitals/Pain NA    SLP Swallow Goals No flowsheet data found.  No flowsheet data found.    CHL IP REASON FOR REFERRAL 12/17/2014  Reason for Referral Objectively evaluate swallowing function     CHL IP ORAL PHASE 12/17/2014  Lips (None)   Tongue (None)  Mucous membranes (None)  Nutritional status (None)  Other (None)  Oxygen therapy (None)  Oral Phase WFL  Oral - Pudding Teaspoon (None)  Oral - Pudding Cup (None)  Oral - Honey Teaspoon (None)  Oral - Honey Cup (None)  Oral - Honey Syringe (None)  Oral - Nectar Teaspoon (None)  Oral - Nectar Cup (None)  Oral - Nectar Straw (None)  Oral - Nectar Syringe (None)  Oral - Ice Chips (None)  Oral - Thin Teaspoon (None)  Oral - Thin Cup (None)  Oral - Thin Straw (None)  Oral - Thin Syringe (None)  Oral - Puree (None)  Oral - Mechanical Soft (None)  Oral - Regular (None)  Oral - Multi-consistency (None)  Oral - Pill (None)  Oral Phase - Comment (None)      CHL IP PHARYNGEAL PHASE 12/17/2014  Pharyngeal Phase Impaired  Pharyngeal - Pudding Teaspoon (None)  Penetration/Aspiration details (pudding teaspoon) (None)  Pharyngeal - Pudding Cup (None)  Penetration/Aspiration details (pudding cup) (None)  Pharyngeal - Honey Teaspoon (None)  Penetration/Aspiration details (honey teaspoon) (None)  Pharyngeal - Honey Cup (None)  Penetration/Aspiration details (honey cup) (None)  Pharyngeal - Honey Syringe (None)  Penetration/Aspiration details (honey syringe) (None)  Pharyngeal - Nectar Teaspoon (None)  Penetration/Aspiration details (nectar teaspoon) (None)  Pharyngeal - Nectar Cup (None)  Penetration/Aspiration details (nectar cup) (None)  Pharyngeal - Nectar Straw (None)  Penetration/Aspiration details (nectar straw) (None)  Pharyngeal - Nectar Syringe (None)  Penetration/Aspiration details (nectar syringe) (None)  Pharyngeal - Ice Chips (None)  Penetration/Aspiration details (ice chips) (None)  Pharyngeal - Thin Teaspoon (  None)  Penetration/Aspiration details (thin teaspoon) (None)  Pharyngeal - Thin Cup (None)  Penetration/Aspiration details (thin cup) (None)  Pharyngeal - Thin Straw (None)  Penetration/Aspiration details (thin straw) (None)  Pharyngeal - Thin Syringe (None)  Penetration/Aspiration details (thin  syringe') (None)  Pharyngeal - Puree (None)  Penetration/Aspiration details (puree) (None)  Pharyngeal - Mechanical Soft (None)  Penetration/Aspiration details (mechanical soft) (None)  Pharyngeal - Regular (None)  Penetration/Aspiration details (regular) (None)  Pharyngeal - Multi-consistency (None)  Penetration/Aspiration details (multi-consistency) (None)  Pharyngeal - Pill (None)  Penetration/Aspiration details (pill) (None)  Pharyngeal Comment (None)      No flowsheet data found.  No flowsheet data found.        Herbie Baltimore, MA CCC-SLP [phone]  DeBlois, Katherene Ponto 12/17/2014, 2:02 PM          ASSESSMENT / PLAN:  Active Problems:   Pneumonia   Malnutrition of moderate degree   Pressure ulcer   Acute respiratory failure with hypoxia   Staphylococcal pneumonia   Septic shock   AKI (acute kidney injury)   Respiratory failure   Palliative care encounter   Dysphagia   PULMONARY A: Acute Resp Failure  Due to HCAP v Aspiration - intbuated 12/12/14 - 12/16/14    -s/p extubation 12/16/14 . Needed BiPAP overnight. Now on 4LNC and looks stable but CXR with significant infiltrates from pneumonia. Unable to coooperate with Pulm toilet due to dementia  P:   o2 for pusle ox > 88% Pulm tolet  BiPAP Prn esp QHS  CARDIOVASCULAR A: Septic Shock>improved , weaned off pressors 8/21 early am  HTN -home meds on hold with clonidine .   - normal BP now   P:   Monitor b/p , keep MAP >65   lasix for volume overload  - but reduce from 20mg  tid to 20mg  daily - TRH to review this again 12/19/14 and consider dc   RENAL A:  Recent Labs Lab 12/14/14 0446 12/15/14 0424 12/16/14 0500 12/17/14 0521 12/18/14 0550  CREATININE 0.90 0.79 0.86 0.87 0.92     AKI  - rsolved. Holding creat well with lasix but BUN rising  P:   Monitor with lasix at reduced dose  GASTROINTESTINAL A: Hx of hyperammoniemia due to Depakote   - off tube feeds. Failed swallow D1 diet  P:   PPI  D1 diet  - ok for meds crushsed in puree  HEMATOLOGIC A:Anemia of critical illness - no obvious bleed P:   Monitor  Hep for DVT prophylaxis -> change to lovenox for dvt proph 12/18/2014 PRBC per protocl  INFECTIOUS  12/12/14 - blood  - corynebacterium - likely contaminant 12/12/14 urine - aerococcus 8./20/16 - MRSA PCR - POSITIVE 12/12/14 - Sputum - MSSA     A: Sepsis of pulmonary origin, HCAP along with UTI. Bacteremia likely contaminant    P:   8/20 Ceftazidime ?> 12/14/14 8/20 Vanc  > 8/21, 8/21 >>12/16/14 Clinda 12/13/14 >12/13/14   Unasyn 8./24/16 >> (total 10 days of unasyn specifically - stop date set by  ICU pharm)     ENDOCRINE A: No known issues P:   Routine blood glucose checks. Add SSI if BS >180   NEUROLOGIC A: Hx of schizophrenia    - Hx of progressive dementia obtained on 12/17/14 - likely FAST 7A (speak few sentence, swallow eval pending, using walker but declining)  - Currently minimal words - reports this is baseline or worse    P:   If agitated, consider precedex  Holding  home meds,  klonopin,rissperdal , nuedexta - since admit 12/12/2014 - slowly aim to restart based on clinical situation Home meds  back on cogentin, zoloft and depakote as of 12/14/2014 TRH to sort this out   DERM A: RN says no stage 1 ulcer because it blanches - 12/14/2014 P Monitor   OTHERS A - poor IV Access P  - leave CVL in place   GLOBAL A: Baseline progressive dementia likely 7A./B. Deconditioned. Lives in group home. Palliative care meeting in progress.    - reports from RN 12/18/2014 that he is likey DNAR. Pal P  palliative care consult working on this - d/w Vinie Sill of [place] to [place] / DISPOSITION Level of Care:  ICU -> SDU 12/18/2014  Primary Service:  PCCM -> TRH 12/19/14 and pccm off  Consultants:  None Code Status:  Full -> pall care involved Diet:  NPO  DVT Px:  Lovenox GI Px:  PPI  Skin Integrity:  Intact Social /  Family: non avaiblable .[place] visited him 12/14/2014 .    TODAY'S SUMMARY: 64 y/o man with  HCAP Extuibated 12/16/2014. Needing bipap qhs. Move to SDU.  Pall care involved   Dr. Brand Males, M.D., [facility].C.P Pulmonary and Critical Care Medicine Staff Physician [facility] Pager: [phone], If no answer or between  15:00h - 7:00h: call [phone]  12/18/2014 8:26 AM

## 2014-12-19 LAB — BASIC METABOLIC PANEL
ANION GAP: 9 (ref 5–15)
BUN: 32 mg/dL — ABNORMAL HIGH (ref 6–20)
CALCIUM: 8.8 mg/dL — AB (ref 8.9–10.3)
CHLORIDE: 105 mmol/L (ref 101–111)
CO2: 30 mmol/L (ref 22–32)
Creatinine, Ser: 0.83 mg/dL (ref 0.61–1.24)
GFR calc non Af Amer: >60 mL/min (ref >60)
Glucose, Bld: 119 mg/dL — ABNORMAL HIGH (ref 65–99)
Potassium: 3.7 mmol/L (ref 3.5–5.1)
Sodium: 144 mmol/L (ref 135–145)

## 2014-12-19 LAB — MAGNESIUM: Magnesium: 2.5 mg/dL — ABNORMAL HIGH (ref 1.7–2.4)

## 2014-12-19 LAB — PHOSPHORUS: PHOSPHORUS: 3.7 mg/dL (ref 2.5–4.6)

## 2014-12-19 NOTE — Progress Notes (Signed)
ANTIBIOTIC CONSULT NOTE - Follow-up  Pharmacy Consult for Unasyn Indication: pneumonia   No Known Allergies  Patient Measurements: Height: 5\' 11"  (180.3 cm) Weight: 188 lb 7.9 oz (85.5 kg) IBW/kg (Calculated) : 75.3  Vital Signs: Temp: 97.3 F (36.3 C) (08/27 0450) Temp Source: Oral (08/27 0450) BP: 152/74 mmHg (08/27 0450) Pulse Rate: 84 (08/27 0450) Intake/Output from previous day: 08/26 0701 - 08/27 0700 In: 460 [P.O.:160; I.V.:100; IV Piggyback:200] Out: 800 [Urine:800] Intake/Output from this shift:    Labs:  Recent Labs  12/17/14 0521 12/18/14 0550 12/19/14 0425  WBC 7.8 9.7  --   HGB 10.6* 10.6*  --   PLT 281 373  --   CREATININE 0.87 0.92 0.83   Estimated Creatinine Clearance: 94.5 mL/min (by C-G formula based on Cr of 0.83). No results for input(s): VANCOTROUGH, VANCOPEAK, VANCORANDOM, GENTTROUGH, GENTPEAK, GENTRANDOM, TOBRATROUGH, TOBRAPEAK, TOBRARND, AMIKACINPEAK, AMIKACINTROU, AMIKACIN in the last 72 hours.   Microbiology: Recent Results (from the past 720 hour(s))  Blood Culture (routine x 2)     Status: None   Collection Time: 12/12/14  4:10 AM  Result Value Ref Range Status   Specimen Description BLOOD LEFT ANTECUBITAL  Final   Special Requests BOTTLES DRAWN AEROBIC AND ANAEROBIC 5CCS  Final   Culture  Setup Time   Final    GRAM POSITIVE RODS IN ANAEROBIC BOTTLE CRITICAL RESULT CALLED TO, READ BACK BY AND VERIFIED WITH: RN ROBIN,L AT [id number] MARTINB CONFIRMED BY K WOOTEN    Culture   Final    DIPHTHEROIDS(CORYNEBACTERIUM SPECIES) Standardized susceptibility testing for this organism is not available. MULTIPLE COLONY MORPHOLOGY TYPES PRESENT    Report Status 12/16/2014 FINAL  Final  Blood Culture (routine x 2)     Status: None   Collection Time: 12/12/14  4:10 AM  Result Value Ref Range Status   Specimen Description BLOOD LEFT FEMORAL ARTERY  Final   Special Requests AEB  5CC   Final   Culture  Setup Time   Final    GRAM POSITIVE  RODS AEROBIC BOTTLE ONLY CRITICAL RESULT CALLED TO, READ BACK BY AND VERIFIED WITH: Levada Dy RN [phone] Lydia     Culture   Final    DIPHTHEROIDS(CORYNEBACTERIUM SPECIES) Standardized susceptibility testing for this organism is not available. MULTIPLE COLONY MORPHOLOGY TYPES PRESENT    Report Status 12/16/2014 FINAL  Final  Urine culture     Status: None   Collection Time: 12/12/14  4:50 AM  Result Value Ref Range Status   Specimen Description URINE, CATHETERIZED  Final   Special Requests Normal  Final   Culture >=100,000 COLONIES/mL AEROCOCCUS URINAE  Final   Report Status 12/14/2014 FINAL  Final  MRSA PCR Screening     Status: Abnormal   Collection Time: 12/12/14  6:37 AM  Result Value Ref Range Status   MRSA by PCR POSITIVE (A) NEGATIVE Final    Comment: RESULT CALLED TO, READ BACK BY AND VERIFIED WITH: A. BEACHY,RN AT [id number] ON [id number] BY K. PEELE        The GeneXpert MRSA Assay (FDA approved for NASAL specimens only), is one component of a comprehensive MRSA colonization surveillance program. It is not intended to diagnose MRSA infection nor to guide or monitor treatment for MRSA infections.   Culture, respiratory (NON-Expectorated)     Status: None   Collection Time: 12/12/14  6:41 AM  Result Value Ref Range Status   Specimen Description TRACHEAL ASPIRATE  Final  Special Requests NONE  Final   Gram Stain   Final    MODERATE WBC PRESENT, PREDOMINANTLY PMN NO SQUAMOUS EPITHELIAL CELLS SEEN ABUNDANT GRAM POSITIVE COCCI IN PAIRS IN CLUSTERS FEW GRAM NEGATIVE COCCI FEW GRAM NEGATIVE RODS    Culture   Final    MODERATE STAPHYLOCOCCUS AUREUS Note: RIFAMPIN AND GENTAMICIN SHOULD NOT BE USED AS SINGLE DRUGS FOR TREATMENT OF STAPH INFECTIONS. Performed at [facility]    Report Status 12/15/2014 FINAL  Final   Organism ID, Bacteria STAPHYLOCOCCUS AUREUS  Final      Susceptibility   Staphylococcus aureus - MIC*    CLINDAMYCIN  <=0.25 SENSITIVE Sensitive     ERYTHROMYCIN <=0.25 SENSITIVE Sensitive     GENTAMICIN <=0.5 SENSITIVE Sensitive     LEVOFLOXACIN 0.25 SENSITIVE Sensitive     OXACILLIN 0.5 SENSITIVE Sensitive     RIFAMPIN <=0.5 SENSITIVE Sensitive     TRIMETH/SULFA <=10 SENSITIVE Sensitive     VANCOMYCIN 1 SENSITIVE Sensitive     TETRACYCLINE <=1 SENSITIVE Sensitive     MOXIFLOXACIN <=0.25 SENSITIVE Sensitive     * MODERATE STAPHYLOCOCCUS AUREUS   Assessment: 64 y/o M from nursing home found unresponsive/hypotensive/febrile and required intubation in the ED. He continues on D#4 of unasyn with a planned DC on 8/30. Tmax is 100.6 and WBC is WNL. Dose appropriate.   Unasyn 8/24 > Vancomycin 8/20 > 8/21, back on 8/22 > off 8/24 Fortaz 8/20 > 8/22  Clindamycin 8/22 > 8/22   MRSA PCR +  Resp Cx 8/20 > MSSA Urine cx 8/20 >  >100K aerococcus urinae BCx x2 8/20 > corynebacterium -susceptibility testing not available, usually a contaminant   Goal of Therapy:  Resolution of infection, clinical improvement  Plan:  - Continue Unasyn 3g IV q8h   *Pharmacy will sign-off as no dose adjustments are anticipated and a stop date is in place. Thank you for the  Consult!  Salome Arnt, PharmD, BCPS Pager # [phone] 12/19/2014 9:45 AM

## 2014-12-19 NOTE — Progress Notes (Signed)
Patient ID: [name], male   DOB: Jul 22, 1950, 64 y.o.   MRN: [id number]  [facility] PROGRESS NOTE  Demarri Elie CBJ:[id number] DOB: August 03, 1950 DOA: 01/07/15 PCP: [name], MD   Brief narrative:    63 y/o man (ward of state) w/ hx of schizophrenia, prior PNA, p/w AMS and found to have severe sepsis of pulmonary origin with respiratory failure.  SIGNIFICANT EVENTS / STUDIES:  Jan 07, 2023 - Intubated in ED 08-Jan-2023 - Weaned off pressors overnight. Sedated on vent, awakens but does not follow commands  8/22 - Still on vent. Off pressors. Psych meds restarted. On fent gtt -> RASS -2, moves all 4s. Failed SBT x 2. MRSA PCR + 8/23 - Off pressors. Did do SBT for 2-3h but now failing. Hypoactive delirium +. Rt IJ site bruise. Cx resp with MSSA, blood GPR 8/24 - different microbes from different sources, narrow to Unasyn depending on blood sensit. Doing SBT 8/25 - Needed biPAP overnight. Now on 6L [place] 8/27 - [facility] assumed care   Assessment/Plan:    Acute respiratory failure with hypoxia/VDRF - secondary to HCAP vs aspiration - respiratory culture positive for MSSA  - intubated from 01-07-2023 - 8/24 - needs intermittent BiPAP but currently maintaining oxygen saturations at target range on 4-6 L [place] - pt unable to cooperate with pulm toiletry due to significant dementia   Septic shock secondary to HCAP, MSSA + sputum culture, UTI (urine culture with aerococcus) - g+ rods in blood likely contaminant  - weaned off pressors 01/08/23 - BP currently stable, keep MAP > 65 - continue Lasix for now   Acute kidney injury - pre renal in etiology, secondary to the above - resolved, Cr is WNL this AM  Moderate PCM - appreciate nutritionist assistance - dys I diet if pt able to tolerate   Anemia of critical illness - Hg remains stable, no signs of active bleeding - CBC in AM  Acute encephalopathy - secondary to critical illness - pt also with underlying dementia and schizophrenia - speaks  very little at baseline  - home medications klonopin, Risperdal, Nuedexta have been held since admission - will slowly restart   DVT prophylaxis - Lovenox SQ  Code Status: Full.  Family Communication: no POA at the bedside  Disposition Plan: Not ready for discharge at this time   IV access:  CVC RIJ (placed by ED) - 01/07/2015  Procedures and diagnostic studies:    Ct Head Wo Contrast  01/07/15   Negative CT head.     Dg Chest Port 1 View  12/16/2014 Stable support apparatus. Stable bibasilar opacities concerning for pneumonia or atelectasis.     Dg Chest Port 1 View  12/14/2014  Lines and tubes in stable position. Progressive bibasilar atelectasis and/or infiltrates.   Dg Chest Port 1 View  01/08/2015    Improving bibasilar infiltrates since prior exam.    Medical Consultants:  Care transferred from PCCM to [facility] 12/19/2014 PCT  Other Consultants:  PT SLP Nutritionist   IAnti-Infectives:   Ceftazidime 01-07-2023 --> 8/22 Vancomycin Jan 07, 2023 --> 8/24 Clindamycin x 1 dose 01/08/2023 Unasyn 8/24 --> for total of 10 days   Faye Ramsay, MD  [facility] Pager [phone]  If 7PM-7AM, please contact night-coverage [email or web address] Password [facility] 12/19/2014, 6:34 PM   LOS: 7 days   HPI/Subjective: No events overnight.   Objective: Filed Vitals:   12/19/14 0900 12/19/14 1208 12/19/14 1600 12/19/14 1744  BP: 158/75 164/83 159/73   Pulse: 85 101 97   Temp: 98.6  F (37 C) 98 F (36.7 C) 101.1 F (38.4 C) 99.4 F (37.4 C)  TempSrc: Oral Oral Axillary Oral  Resp: 26 25 22    Height:      Weight:      SpO2: 95% 94% 93%     Intake/Output Summary (Last 24 hours) at 12/19/14 1834 Last data filed at 12/19/14 1700  Gross per 24 hour  Intake    520 ml  Output   1200 ml  Net   -680 ml    Exam:   General: Ill looking man appearing older than stated age  Neuro: RASS 0 follows simple commands, moves all 4s but confused, minimall verbal  HEENT: Nasal cannula 4L - 93%  Neck: No JVD    Cardiovascular: HR 94/minc, no M/R/G  Lungs: Abdomen: Soft, BS +  Musculoskeletal: No deformities.  Skin: No rashes.  Data Reviewed: Basic Metabolic Panel:  Recent Labs Lab 12/15/14 0424 12/15/14 1153 12/16/14 0013 12/16/14 0500 12/17/14 0521 12/18/14 0550 12/19/14 0425  NA 140  --   --  138 139 140 144  K 3.9  --   --  3.7 4.4 4.4 3.7  CL 110  --   --  100* 100* 100* 105  CO2 25  --   --  30 31 31 30   GLUCOSE 108*  --   --  110* 103* 108* 119*  BUN 17  --   --  21* 23* 29* 32*  CREATININE 0.79  --   --  0.86 0.87 0.92 0.83  CALCIUM 7.8*  --   --  7.9* 8.6* 8.7* 8.8*  MG  --  1.8 1.6*  --  2.0 2.2 2.5*  PHOS  --  2.8 2.8  --  3.9 3.9 3.7   Liver Function Tests:  Recent Labs Lab 12/14/14 0446  AST 28  ALT 42  ALKPHOS 61  BILITOT 0.5  PROT 6.0*  ALBUMIN 2.3*    Recent Labs Lab 12/18/14 0551  AMMONIA 38*   CBC:  Recent Labs Lab 12/14/14 0446 12/15/14 0424 12/16/14 0500 12/17/14 0521 12/18/14 0550  WBC 7.0 6.8 6.9 7.8 9.7  NEUTROABS  --  4.4 3.5 3.9 5.6  HGB 11.3* 9.9* 9.6* 10.6* 10.6*  HCT 34.0* 29.8* 28.8* 31.8* 32.0*  MCV 88.8 89.2 88.6 90.6 89.9  PLT 202 196 214 281 373   CBG:  Recent Labs Lab 12/17/14 1512 12/17/14 1931 12/17/14 2328 12/18/14 0418 12/18/14 0743  GLUCAP 111* 129* 108* 103* 116*    Recent Results (from the past 240 hour(s))  Blood Culture (routine x 2)     Status: None   Collection Time: 12/12/14  4:10 AM  Result Value Ref Range Status   Specimen Description BLOOD LEFT ANTECUBITAL  Final   Special Requests BOTTLES DRAWN AEROBIC AND ANAEROBIC 5CCS  Final   Culture  Setup Time   Final    GRAM POSITIVE RODS IN ANAEROBIC BOTTLE CRITICAL RESULT CALLED TO, READ BACK BY AND VERIFIED WITH: RN ROBIN,L AT [place] [id number] MARTINB CONFIRMED BY K WOOTEN    Culture   Final    DIPHTHEROIDS(CORYNEBACTERIUM SPECIES) Standardized susceptibility testing for this organism is not available. MULTIPLE COLONY MORPHOLOGY TYPES  PRESENT    Report Status 12/16/2014 FINAL  Final  Blood Culture (routine x 2)     Status: None   Collection Time: 12/12/14  4:10 AM  Result Value Ref Range Status   Specimen Description BLOOD LEFT FEMORAL ARTERY  Final   Special Requests AEB  5CC   Final   Culture  Setup Time   Final    GRAM POSITIVE RODS AEROBIC BOTTLE ONLY CRITICAL RESULT CALLED TO, READ BACK BY AND VERIFIED WITH: Levada Dy RN [phone] [place]     Culture   Final    DIPHTHEROIDS(CORYNEBACTERIUM SPECIES) Standardized susceptibility testing for this organism is not available. MULTIPLE COLONY MORPHOLOGY TYPES PRESENT    Report Status 12/16/2014 FINAL  Final  Urine culture     Status: None   Collection Time: 12/12/14  4:50 AM  Result Value Ref Range Status   Specimen Description URINE, CATHETERIZED  Final   Special Requests Normal  Final   Culture >=100,000 COLONIES/mL AEROCOCCUS URINAE  Final   Report Status 12/14/2014 FINAL  Final  MRSA PCR Screening     Status: Abnormal   Collection Time: 12/12/14  6:37 AM  Result Value Ref Range Status   MRSA by PCR POSITIVE (A) NEGATIVE Final    Comment: RESULT CALLED TO, READ BACK BY AND VERIFIED WITH: A. BEACHY,RN AT [id number] ON [id number] BY K. PEELE        The GeneXpert MRSA Assay (FDA approved for NASAL specimens only), is one component of a comprehensive MRSA colonization surveillance program. It is not intended to diagnose MRSA infection nor to guide or monitor treatment for MRSA infections.   Culture, respiratory (NON-Expectorated)     Status: None   Collection Time: 12/12/14  6:41 AM  Result Value Ref Range Status   Specimen Description TRACHEAL ASPIRATE  Final   Special Requests NONE  Final   Gram Stain   Final    MODERATE WBC PRESENT, PREDOMINANTLY PMN NO SQUAMOUS EPITHELIAL CELLS SEEN ABUNDANT GRAM POSITIVE COCCI IN PAIRS IN CLUSTERS FEW GRAM NEGATIVE COCCI FEW GRAM NEGATIVE RODS    Culture   Final    MODERATE STAPHYLOCOCCUS  AUREUS Note: RIFAMPIN AND GENTAMICIN SHOULD NOT BE USED AS SINGLE DRUGS FOR TREATMENT OF STAPH INFECTIONS. Performed at [facility]    Report Status 12/15/2014 FINAL  Final   Organism ID, Bacteria STAPHYLOCOCCUS AUREUS  Final      Susceptibility   Staphylococcus aureus - MIC*    CLINDAMYCIN <=0.25 SENSITIVE Sensitive     ERYTHROMYCIN <=0.25 SENSITIVE Sensitive     GENTAMICIN <=0.5 SENSITIVE Sensitive     LEVOFLOXACIN 0.25 SENSITIVE Sensitive     OXACILLIN 0.5 SENSITIVE Sensitive     RIFAMPIN <=0.5 SENSITIVE Sensitive     TRIMETH/SULFA <=10 SENSITIVE Sensitive     VANCOMYCIN 1 SENSITIVE Sensitive     TETRACYCLINE <=1 SENSITIVE Sensitive     MOXIFLOXACIN <=0.25 SENSITIVE Sensitive     * MODERATE STAPHYLOCOCCUS AUREUS     Scheduled Meds: . ampicillin-sulbactam (UNASYN) IV  3 g Intravenous Q8H  . benztropine  1 mg Oral BID  . enoxaparin (LOVENOX) injection  40 mg Subcutaneous Q24H  . famotidine  20 mg Oral Daily  . furosemide  20 mg Intravenous Daily  . potassium chloride  20 mEq Oral Daily  . sertraline  150 mg Oral Daily  . Valproic Acid  125 mg Oral BID   Continuous Infusions:

## 2014-12-20 ENCOUNTER — Inpatient Hospital Stay (HOSPITAL_COMMUNITY): Payer: Medicare Other

## 2014-12-20 LAB — BASIC METABOLIC PANEL
ANION GAP: 9 (ref 5–15)
BUN: 37 mg/dL — AB (ref 6–20)
CHLORIDE: 111 mmol/L (ref 101–111)
CO2: 31 mmol/L (ref 22–32)
Calcium: 8.6 mg/dL — ABNORMAL LOW (ref 8.9–10.3)
Creatinine, Ser: 1.1 mg/dL (ref 0.61–1.24)
Glucose, Bld: 126 mg/dL — ABNORMAL HIGH (ref 65–99)
POTASSIUM: 3.8 mmol/L (ref 3.5–5.1)
SODIUM: 151 mmol/L — AB (ref 135–145)

## 2014-12-20 LAB — URINALYSIS, ROUTINE W REFLEX MICROSCOPIC
Bilirubin Urine: NEGATIVE
GLUCOSE, UA: NEGATIVE mg/dL
Hgb urine dipstick: NEGATIVE
KETONES UR: NEGATIVE mg/dL
LEUKOCYTES UA: NEGATIVE
NITRITE: NEGATIVE
PROTEIN: NEGATIVE mg/dL
Specific Gravity, Urine: 1.03 (ref 1.005–1.030)
UROBILINOGEN UA: 0.2 mg/dL (ref 0.0–1.0)
pH: 5.5 (ref 5.0–8.0)

## 2014-12-20 LAB — CBC
HCT: 34.5 % — ABNORMAL LOW (ref 39.0–52.0)
HEMOGLOBIN: 11.2 g/dL — AB (ref 13.0–17.0)
MCH: 30 pg (ref 26.0–34.0)
MCHC: 32.5 g/dL (ref 30.0–36.0)
MCV: 92.5 fL (ref 78.0–100.0)
PLATELETS: 458 10*3/uL — AB (ref 150–400)
RBC: 3.73 MIL/uL — AB (ref 4.22–5.81)
RDW: 14.5 % (ref 11.5–15.5)
WBC: 13.4 10*3/uL — AB (ref 4.0–10.5)

## 2014-12-20 LAB — MAGNESIUM: MAGNESIUM: 2.5 mg/dL — AB (ref 1.7–2.4)

## 2014-12-20 LAB — PHOSPHORUS: Phosphorus: 4.1 mg/dL (ref 2.5–4.6)

## 2014-12-20 MED ORDER — HYDRALAZINE HCL 10 MG PO TABS
10.0000 mg | ORAL_TABLET | Freq: Three times a day (TID) | ORAL | Status: DC
  Administered 2014-12-20 – 2014-12-25 (×7): 10 mg via ORAL
  Filled 2014-12-20 (×10): qty 1

## 2014-12-20 NOTE — Progress Notes (Addendum)
Patient ID: Kevin Humphrey, male   DOB: 03-09-1951, 64 y.o.   MRN: [id number]  [facility] PROGRESS NOTE  Kevin Humphrey ZTI:[id number] DOB: [birth year] DOA: 30-Dec-2014 PCP: [name], MD   Brief narrative:    64 y/o man (ward of state) w/ hx of schizophrenia, prior PNA, p/w AMS and found to have severe sepsis of pulmonary origin with respiratory failure.  SIGNIFICANT EVENTS / STUDIES:  2022-12-30 - Intubated in ED 12-31-22 - Weaned off pressors overnight. Sedated on vent, awakens but does not follow commands  8/22 - Still on vent. Off pressors. Psych meds restarted. On fent gtt -> RASS -2, moves all 4s. Failed SBT x 2. MRSA PCR + 8/23 - Off pressors. Did do SBT for 2-3h but now failing. Hypoactive delirium +. Rt IJ site bruise. Cx resp with MSSA, blood GPR 8/24 - different microbes from different sources, narrow to Unasyn depending on blood sensit. Doing SBT 8/25 - Needed biPAP overnight. Now on 6L [place] 8/27 - [facility] assumed care   Assessment/Plan:    Acute respiratory failure with hypoxia/VDRF - secondary to HCAP vs aspiration - respiratory culture positive for MSSA  - intubated from 2022-12-30 - 8/24 - needs intermittent BiPAP but currently maintaining oxygen saturations at target range on 4-6 L [place] - pt unable to cooperate with pulm toiletry due to significant dementia   Septic shock secondary to HCAP, MSSA + sputum culture, UTI (urine culture with aerococcus) - repeat blood work notable for worsening WBC 13.4, pt still with T max 101.1 F in the past 24 hours  - g+ rods in blood likely contaminant  - pt has MSSA positive sputum Cx and urine Cx with aerococcus, has been on Unasyn per PCCM to complete therapy for 10 days  - today is day #5/10 of unasyn - I will repeat blood culture, UA and urine culture, CXR as pt still spiking fevers - weaned off pressors Dec 31, 2022 - BP currently stable, keep MAP > 65  Acute kidney injury - pre renal in etiology, secondary to the above - resolved, Cr is  WNL this AM  Hypernatremia - up to 151 this am - hold lasix today - will place on gentle hydration with D5 for about 12 hours and will repeat BMP in AM  Moderate PCM - appreciate nutritionist assistance - dys I diet if pt able to tolerate   Accelerated HTN - will place on hydralazine scheduled 10 mg PO TID and will add hydralazine as needed   Anemia of critical illness - Hg remains stable, no signs of active bleeding - CBC in AM  Acute encephalopathy - secondary to critical illness - pt also with underlying dementia and schizophrenia - speaks very little at baseline  - home medications klonopin, Risperdal, Nuedexta have been held since admission - will slowly restart   Functional quadriplegia - attempt PT/OT, OOB to chair   DVT prophylaxis - Lovenox SQ  Code Status: Full.  Family Communication: no POA at the bedside  Disposition Plan: Not ready for discharge at this time   IV access:  CVC RIJ (placed by ED) - Dec 30, 2014  Procedures and diagnostic studies:    Ct Head Wo Contrast  12-30-14   Negative CT head.     Dg Chest Port 1 View  12/16/2014 Stable support apparatus. Stable bibasilar opacities concerning for pneumonia or atelectasis.     Dg Chest Port 1 View  12/14/2014  Lines and tubes in stable position. Progressive bibasilar atelectasis and/or infiltrates.   Dg  Chest Port 1 View  12/13/2014    Improving bibasilar infiltrates since prior exam.    Medical Consultants:  Care transferred from PCCM to [facility] 12/19/2014 PCT  Other Consultants:  PT SLP Nutritionist   IAnti-Infectives:   Ceftazidime 8/20 --> 8/22 Vancomycin 8/20 --> 8/24 Clindamycin x 1 dose 8/21 Unasyn 8/24 --> for total of 10 days   Faye Ramsay, MD  [facility] Pager [phone]  If 7PM-7AM, please contact night-coverage [email or web address] Password [email or web address] 12/20/2014, 11:01 AM   LOS: 8 days   HPI/Subjective: No events overnight.   Objective: Filed Vitals:   12/19/14 2323 12/20/14 0000 12/20/14  0400 12/20/14 0800  BP: 163/76 162/78 167/81 187/156  Pulse: 100 101 91 88  Temp: 98.1 F (36.7 C)  98.3 F (36.8 C) 98.6 F (37 C)  TempSrc: Oral  Oral Oral  Resp: 27 29 36 32  Height:      Weight:   83.6 kg (184 lb 4.9 oz)   SpO2: 95% 93% 96% 90%    Intake/Output Summary (Last 24 hours) at 12/20/14 1101 Last data filed at 12/20/14 0600  Gross per 24 hour  Intake    420 ml  Output   1027 ml  Net   -607 ml    Exam:   General: Ill looking man appearing older than stated age  Neuro: RASS 0 follows simple commands, moves all 4s but confused, minimall verbal  HEENT: Nasal cannula 4L  Neck: No JVD   Cardiovascular: HR 94/minc, no M/R/G  Lungs: Abdomen: Soft, BS +  Musculoskeletal: No deformities.  Skin: No rashes.  Data Reviewed: Basic Metabolic Panel:  Recent Labs Lab 12/16/14 0013 12/16/14 0500 12/17/14 0521 12/18/14 0550 12/19/14 0425 12/20/14 0437  NA  --  138 139 140 144 151*  K  --  3.7 4.4 4.4 3.7 3.8  CL  --  100* 100* 100* 105 111  CO2  --  30 31 31 30 31   GLUCOSE  --  110* 103* 108* 119* 126*  BUN  --  21* 23* 29* 32* 37*  CREATININE  --  0.86 0.87 0.92 0.83 1.10  CALCIUM  --  7.9* 8.6* 8.7* 8.8* 8.6*  MG 1.6*  --  2.0 2.2 2.5* 2.5*  PHOS 2.8  --  3.9 3.9 3.7 4.1   Liver Function Tests:  Recent Labs Lab 12/14/14 0446  AST 28  ALT 42  ALKPHOS 61  BILITOT 0.5  PROT 6.0*  ALBUMIN 2.3*    Recent Labs Lab 12/18/14 0551  AMMONIA 38*   CBC:  Recent Labs Lab 12/15/14 0424 12/16/14 0500 12/17/14 0521 12/18/14 0550 12/20/14 0437  WBC 6.8 6.9 7.8 9.7 13.4*  NEUTROABS 4.4 3.5 3.9 5.6  --   HGB 9.9* 9.6* 10.6* 10.6* 11.2*  HCT 29.8* 28.8* 31.8* 32.0* 34.5*  MCV 89.2 88.6 90.6 89.9 92.5  PLT 196 214 281 373 458*   CBG:  Recent Labs Lab 12/17/14 1512 12/17/14 1931 12/17/14 2328 12/18/14 0418 12/18/14 0743  GLUCAP 111* 129* 108* 103* 116*    Recent Results (from the past 240 hour(s))  Blood Culture (routine x  2)     Status: None   Collection Time: 12/12/14  4:10 AM  Result Value Ref Range Status   Specimen Description BLOOD LEFT ANTECUBITAL  Final   Special Requests BOTTLES DRAWN AEROBIC AND ANAEROBIC 5CCS  Final   Culture  Setup Time   Final    GRAM POSITIVE RODS IN ANAEROBIC BOTTLE CRITICAL RESULT CALLED TO, READ  BACK BY AND VERIFIED WITH: RN ROBIN,L AT [id number] MARTINB CONFIRMED BY K WOOTEN    Culture   Final    DIPHTHEROIDS(CORYNEBACTERIUM SPECIES) Standardized susceptibility testing for this organism is not available. MULTIPLE COLONY MORPHOLOGY TYPES PRESENT    Report Status 12/16/2014 FINAL  Final  Blood Culture (routine x 2)     Status: None   Collection Time: 12/12/14  4:10 AM  Result Value Ref Range Status   Specimen Description BLOOD LEFT FEMORAL ARTERY  Final   Special Requests AEB  5CC   Final   Culture  Setup Time   Final    GRAM POSITIVE RODS AEROBIC BOTTLE ONLY CRITICAL RESULT CALLED TO, READ BACK BY AND VERIFIED WITH: Levada Dy RN [id number] [place]     Culture   Final    DIPHTHEROIDS(CORYNEBACTERIUM SPECIES) Standardized susceptibility testing for this organism is not available. MULTIPLE COLONY MORPHOLOGY TYPES PRESENT    Report Status 12/16/2014 FINAL  Final  Urine culture     Status: None   Collection Time: 12/12/14  4:50 AM  Result Value Ref Range Status   Specimen Description URINE, CATHETERIZED  Final   Special Requests Normal  Final   Culture >=100,000 COLONIES/mL AEROCOCCUS URINAE  Final   Report Status 12/14/2014 FINAL  Final  MRSA PCR Screening     Status: Abnormal   Collection Time: 12/12/14  6:37 AM  Result Value Ref Range Status   MRSA by PCR POSITIVE (A) NEGATIVE Final    Comment: RESULT CALLED TO, READ BACK BY AND VERIFIED WITH: A. BEACHY,RN AT [id number] ON [id number] BY K. PEELE        The GeneXpert MRSA Assay (FDA approved for NASAL specimens only), is one component of a comprehensive MRSA colonization surveillance  program. It is not intended to diagnose MRSA infection nor to guide or monitor treatment for MRSA infections.   Culture, respiratory (NON-Expectorated)     Status: None   Collection Time: 12/12/14  6:41 AM  Result Value Ref Range Status   Specimen Description TRACHEAL ASPIRATE  Final   Special Requests NONE  Final   Gram Stain   Final    MODERATE WBC PRESENT, PREDOMINANTLY PMN NO SQUAMOUS EPITHELIAL CELLS SEEN ABUNDANT GRAM POSITIVE COCCI IN PAIRS IN CLUSTERS FEW GRAM NEGATIVE COCCI FEW GRAM NEGATIVE RODS    Culture   Final    MODERATE STAPHYLOCOCCUS AUREUS Note: RIFAMPIN AND GENTAMICIN SHOULD NOT BE USED AS SINGLE DRUGS FOR TREATMENT OF STAPH INFECTIONS. Performed at [facility]    Report Status 12/15/2014 FINAL  Final   Organism ID, Bacteria STAPHYLOCOCCUS AUREUS  Final      Susceptibility   Staphylococcus aureus - MIC*    CLINDAMYCIN <=0.25 SENSITIVE Sensitive     ERYTHROMYCIN <=0.25 SENSITIVE Sensitive     GENTAMICIN <=0.5 SENSITIVE Sensitive     LEVOFLOXACIN 0.25 SENSITIVE Sensitive     OXACILLIN 0.5 SENSITIVE Sensitive     RIFAMPIN <=0.5 SENSITIVE Sensitive     TRIMETH/SULFA <=10 SENSITIVE Sensitive     VANCOMYCIN 1 SENSITIVE Sensitive     TETRACYCLINE <=1 SENSITIVE Sensitive     MOXIFLOXACIN <=0.25 SENSITIVE Sensitive     * MODERATE STAPHYLOCOCCUS AUREUS     Scheduled Meds: . ampicillin-sulbactam (UNASYN) IV  3 g Intravenous Q8H  . benztropine  1 mg Oral BID  . enoxaparin (LOVENOX) injection  40 mg Subcutaneous Q24H  . famotidine  20 mg Oral Daily  . furosemide  20 mg  Intravenous Daily  . potassium chloride  20 mEq Oral Daily  . sertraline  150 mg Oral Daily  . Valproic Acid  125 mg Oral BID   Continuous Infusions:

## 2014-12-21 LAB — CBC
HEMATOCRIT: 35.4 % — AB (ref 39.0–52.0)
HEMOGLOBIN: 11.2 g/dL — AB (ref 13.0–17.0)
MCH: 29.8 pg (ref 26.0–34.0)
MCHC: 31.6 g/dL (ref 30.0–36.0)
MCV: 94.1 fL (ref 78.0–100.0)
PLATELETS: 466 10*3/uL — AB (ref 150–400)
RBC: 3.76 MIL/uL — AB (ref 4.22–5.81)
RDW: 15 % (ref 11.5–15.5)
WBC: 13 10*3/uL — AB (ref 4.0–10.5)

## 2014-12-21 LAB — URINE CULTURE: Culture: NO GROWTH

## 2014-12-21 LAB — BASIC METABOLIC PANEL
Anion gap: 8 (ref 5–15)
BUN: 30 mg/dL — ABNORMAL HIGH (ref 6–20)
CALCIUM: 8.7 mg/dL — AB (ref 8.9–10.3)
CO2: 32 mmol/L (ref 22–32)
CREATININE: 0.99 mg/dL (ref 0.61–1.24)
Chloride: 117 mmol/L — ABNORMAL HIGH (ref 101–111)
GFR calc Af Amer: >60 mL/min (ref >60)
GFR calc non Af Amer: >60 mL/min (ref >60)
Glucose, Bld: 130 mg/dL — ABNORMAL HIGH (ref 65–99)
Potassium: 3.6 mmol/L (ref 3.5–5.1)
Sodium: 157 mmol/L — ABNORMAL HIGH (ref 135–145)

## 2014-12-21 MED ORDER — DEXTROSE-NACL 5-0.2 % IV SOLN
INTRAVENOUS | Status: DC
  Administered 2014-12-21 – 2014-12-23 (×4): via INTRAVENOUS

## 2014-12-21 NOTE — Progress Notes (Signed)
Physical Therapy Treatment Patient Details Name: Kevin Humphrey MRN: [id number] DOB: Feb 27, 1951 Today's Date: 12/21/2014    History of Present Illness Pt is a 64 y/o male who is a ward of the state. He has a PMH of schizophrenia, prior PNA, from nursing home. He presents with AMS and was found to have severe sepsis of pulmonary origin with respiratory failure. Was extubated on 8/24. Per chart review, mental status is minimal at baseline. Pt is usually able to state name and put together simple sentences.     PT Comments    Patient in bed, agreeable to participate in PT today. Patient was able to transfer as described below. Patient will benefit from continued PT to progress bed mobility and increase alertness through repositioning.  Follow Up Recommendations  SNF;Supervision/Assistance - 24 hour     Equipment Recommendations  None recommended by PT    Recommendations for Other Services       Precautions / Restrictions Precautions Precautions: Fall Restrictions Weight Bearing Restrictions: No    Mobility  Bed Mobility Overal bed mobility: Needs Assistance;+2 for physical assistance Bed Mobility: Supine to Sit;Sit to Supine     Supine to sit: Max assist;+2 for physical assistance Sit to supine: Max assist;+2 for physical assistance   General bed mobility comments: Patient has minimal movement in his arms or legs, including passively. He required +2 physical assistance to get sitting EOB and to return to supine, and required help with all bed positioning.  Transfers                    Ambulation/Gait                 Stairs            Wheelchair Mobility    Modified Rankin (Stroke Patients Only)       Balance Overall balance assessment: Needs assistance Sitting-balance support: Feet supported;Bilateral upper extremity supported Sitting balance-Leahy Scale: Fair Sitting balance - Comments: Patient able to maintain sitting balance for 5-6 mins.  He was able to verbalize when he was tired. Initially, he required max A, but then was able to maintain balance with min guard when he lost control forward about 4 times, probably due to fatigue. Initially stated he felt "very good" sitting up.                            Cognition Arousal/Alertness: Awake/alert Behavior During Therapy: WFL for tasks assessed/performed Overall Cognitive Status: Difficult to assess Area of Impairment: Following commands       Following Commands: Follows one step commands with increased time       General Comments: Patient able to follow commands with increased time and mod tactile cues. He was able to nod yes/no and respond to basic questions with one-word answers after sitting up, and he was able to verbalize that he was tired. Otherwise he did not respond verbally or with head nods.    Exercises      General Comments        Pertinent Vitals/Pain Pain Assessment: No/denies pain    Home Living                      Prior Function            PT Goals (current goals can now be found in the care plan section) Acute Rehab PT Goals Patient Stated Goal: Unable to state  goals. PT Goal Formulation: Patient unable to participate in goal setting Time For Goal Achievement: 12/31/14 Potential to Achieve Goals: Fair Progress towards PT goals: Progressing toward goals    Frequency  Min 2X/week    PT Plan Current plan remains appropriate    Co-evaluation             End of Session Equipment Utilized During Treatment: Oxygen Activity Tolerance: Patient limited by fatigue Patient left: in bed;with call bell/phone within reach     Time: 1006-1024 PT Time Calculation (min) (ACUTE ONLY): 18 min  Charges:  $Therapeutic Activity: 8-22 mins                    G CodesRoanna Epley, SPT [phone] 12/21/2014, 11:26 AM I have read, reviewed and agree with student's note.   [place] [phone] (pager)

## 2014-12-21 NOTE — Progress Notes (Signed)
[place] TEAM 1 - Stepdown/ICU TEAM PROGRESS NOTE  Carron Jaggi YBW:[id number] DOB: 17-Mar-1951 DOA: 12/12/2014 PCP: [name], MD  Admit HPI / Brief Narrative: 64 y/o man ([place] state) w/ hx of schizophrenia and prior PNA who presented w/ AMS and was found to have severe sepsis of pulmonary origin with respiratory failure.  Significant Events: 8/20 - Intubated in ED 8/21 - Weaned off pressors overnight. Sedated on vent, awakens but does not follow commands  8/22 - Still on vent. Off pressors. Psych meds restarted. On fent gtt -> RASS -2, moves all 4s. Failed SBT x 2. MRSA PCR + 8/23 - Off pressors. Did do SBT for 2-3h but now failing. Hypoactive delirium +. Rt IJ site bruise. Cx resp with MSSA, blood GPR 8/24 - different microbes from different sources, narrow to Unasyn depending on blood sensit. Doing SBT 8/25 - Needed biPAP overnight. Now on 6L [place] 8/27 - [facility] assumed care   HPI/Subjective: The patient will mumble some short answers to questions.  He appears comfortable.  He denies pain.  He will follow the examiner about the room but often times simply looks at the examiner when questions are asked.  He cannot provide a detailed history.  Assessment/Plan:  Acute respiratory failure with hypoxia / VDRF - HCAP v/s aspiration PNA  - respiratory culture positive for MSSA  - intubated from 8/20 > 8/24 - Continue to wean oxygen support as possible - requiring 4 L nasal cannula at present time - pt unable to cooperate with pulm toiletry due to significant dementia   Septic shock secondary to HCAP +/- UTI  - pt has MSSA positive sputum Cx  - on Unasyn per PCCM to complete therapy for 10 days  - weaned off pressors 8/21  Aerococcus UTI - should be covered w/ current medical tx   Diptheroids in 2 of 2 blood cx 8/20 - ?contaminant - not usually a pathogenic organism, but cultures reportedly from 2 different sources - f/u blood cx unrevealing - follow clinically    Acute kidney injury - pre renal in etiology secondary to the above - resolved  Hypernatremia - Na is climbing - provide free water via IV and follow trend (oral intake limited w/ nectar-thick liquid restriction)  Moderate protein calorie malnutrition  - appreciate nutritionist assistance - dys 2 diet if pt able to tolerate   Accelerated HTN - Blood pressure variable but overall appears to be improving - follow trend without change in treatment plan today  Anemia of critical illness - Hg remains stable - no sign of active bleeding  Acute encephalopathy - secondary to critical illness - pt also with underlying dementia and schizophrenia - speaks very little at baseline  - home medications klonopin, Risperdal, Nuedexta held since admission - slowly resuming   Functional quadriplegia - attempt PT/OT, OOB to chair  MRSA Screen +   Code Status: FULL Family Communication: no family present at time of exam Disposition Plan: SDU  Consultants: PCCM > [facility] Palliative Care   Antibiotics: Ceftazidime 8/20 > 8/22 Vancomycin 8/20 > 8/24 Clindamycin 8/21 Unasyn 8/24 >   DVT prophylaxis: Lovenox  Objective: Blood pressure 134/99, pulse 101, temperature 101.8 F (38.8 C), temperature source Oral, resp. rate 35, height 5\' 11"  (1.803 m), weight 81.8 kg (180 lb 5.4 oz), SpO2 95 %.  Intake/Output Summary (Last 24 hours) at 12/21/14 1440 Last data filed at 12/21/14 1305  Gross per 24 hour  Intake    640 ml  Output  600 ml  Net     40 ml   Exam: General: No acute respiratory distress evident  Lungs: Mild bibasilar crackles without wheeze Cardiovascular: Regular rate and rhythm without murmur gallop or rub normal S1 and S2 Abdomen: Nontender, nondistended, soft, bowel sounds positive, no rebound, no ascites, no appreciable mass Extremities: No significant cyanosis, clubbing;  trace edema bilateral lower extremities  Data Reviewed: Basic Metabolic Panel:  Recent  Labs Lab 12/16/14 0013  12/17/14 0521 12/18/14 0550 12/19/14 0425 12/20/14 0437 12/21/14 0508  NA  --   < > 139 140 144 151* 157*  K  --   < > 4.4 4.4 3.7 3.8 3.6  CL  --   < > 100* 100* 105 111 117*  CO2  --   < > 31 31 30 31  32  GLUCOSE  --   < > 103* 108* 119* 126* 130*  BUN  --   < > 23* 29* 32* 37* 30*  CREATININE  --   < > 0.87 0.92 0.83 1.10 0.99  CALCIUM  --   < > 8.6* 8.7* 8.8* 8.6* 8.7*  MG 1.6*  --  2.0 2.2 2.5* 2.5*  --   PHOS 2.8  --  3.9 3.9 3.7 4.1  --   < > = values in this interval not displayed.  CBC:  Recent Labs Lab 12/15/14 0424 12/16/14 0500 12/17/14 0521 12/18/14 0550 12/20/14 0437 12/21/14 0508  WBC 6.8 6.9 7.8 9.7 13.4* 13.0*  NEUTROABS 4.4 3.5 3.9 5.6  --   --   HGB 9.9* 9.6* 10.6* 10.6* 11.2* 11.2*  HCT 29.8* 28.8* 31.8* 32.0* 34.5* 35.4*  MCV 89.2 88.6 90.6 89.9 92.5 94.1  PLT 196 214 281 373 458* 466*    Liver Function Tests: No results for input(s): AST, ALT, ALKPHOS, BILITOT, PROT, ALBUMIN in the last 168 hours. No results for input(s): LIPASE, AMYLASE in the last 168 hours.  Recent Labs Lab 12/18/14 0551  AMMONIA 38*   CBG:  Recent Labs Lab 12/17/14 1512 12/17/14 1931 12/17/14 2328 12/18/14 0418 12/18/14 0743  GLUCAP 111* 129* 108* 103* 116*    Recent Results (from the past 240 hour(s))  Blood Culture (routine x 2)     Status: None   Collection Time: 12/12/14  4:10 AM  Result Value Ref Range Status   Specimen Description BLOOD LEFT ANTECUBITAL  Final   Special Requests BOTTLES DRAWN AEROBIC AND ANAEROBIC 5CCS  Final   Culture  Setup Time   Final    GRAM POSITIVE RODS IN ANAEROBIC BOTTLE CRITICAL RESULT CALLED TO, READ BACK BY AND VERIFIED WITH: RN ROBIN,L AT [id number] MARTINB CONFIRMED BY K WOOTEN    Culture   Final    DIPHTHEROIDS(CORYNEBACTERIUM SPECIES) Standardized susceptibility testing for this organism is not available. MULTIPLE COLONY MORPHOLOGY TYPES PRESENT    Report Status 12/16/2014 FINAL   Final  Blood Culture (routine x 2)     Status: None   Collection Time: 12/12/14  4:10 AM  Result Value Ref Range Status   Specimen Description BLOOD LEFT FEMORAL ARTERY  Final   Special Requests AEB  5CC   Final   Culture  Setup Time   Final    GRAM POSITIVE RODS AEROBIC BOTTLE ONLY CRITICAL RESULT CALLED TO, READ BACK BY AND VERIFIED WITH: Levada Dy RN [id number] [place]     Culture   Final    DIPHTHEROIDS(CORYNEBACTERIUM SPECIES) Standardized susceptibility testing for this organism is  not available. MULTIPLE COLONY MORPHOLOGY TYPES PRESENT    Report Status 12/16/2014 FINAL  Final  Urine culture     Status: None   Collection Time: 12/12/14  4:50 AM  Result Value Ref Range Status   Specimen Description URINE, CATHETERIZED  Final   Special Requests Normal  Final   Culture >=100,000 COLONIES/mL AEROCOCCUS URINAE  Final   Report Status 12/14/2014 FINAL  Final  MRSA PCR Screening     Status: Abnormal   Collection Time: 12/12/14  6:37 AM  Result Value Ref Range Status   MRSA by PCR POSITIVE (A) NEGATIVE Final    Comment: RESULT CALLED TO, READ BACK BY AND VERIFIED WITH: A. BEACHY,RN AT 0623 ON [id number] BY K. PEELE        The GeneXpert MRSA Assay (FDA approved for NASAL specimens only), is one component of a comprehensive MRSA colonization surveillance program. It is not intended to diagnose MRSA infection nor to guide or monitor treatment for MRSA infections.   Culture, respiratory (NON-Expectorated)     Status: None   Collection Time: 12/12/14  6:41 AM  Result Value Ref Range Status   Specimen Description TRACHEAL ASPIRATE  Final   Special Requests NONE  Final   Gram Stain   Final    MODERATE WBC PRESENT, PREDOMINANTLY PMN NO SQUAMOUS EPITHELIAL CELLS SEEN ABUNDANT GRAM POSITIVE COCCI IN PAIRS IN CLUSTERS FEW GRAM NEGATIVE COCCI FEW GRAM NEGATIVE RODS    Culture   Final    MODERATE STAPHYLOCOCCUS AUREUS Note: RIFAMPIN AND GENTAMICIN SHOULD  NOT BE USED AS SINGLE DRUGS FOR TREATMENT OF STAPH INFECTIONS. Performed at [facility]    Report Status 12/15/2014 FINAL  Final   Organism ID, Bacteria STAPHYLOCOCCUS AUREUS  Final      Susceptibility   Staphylococcus aureus - MIC*    CLINDAMYCIN <=0.25 SENSITIVE Sensitive     ERYTHROMYCIN <=0.25 SENSITIVE Sensitive     GENTAMICIN <=0.5 SENSITIVE Sensitive     LEVOFLOXACIN 0.25 SENSITIVE Sensitive     OXACILLIN 0.5 SENSITIVE Sensitive     RIFAMPIN <=0.5 SENSITIVE Sensitive     TRIMETH/SULFA <=10 SENSITIVE Sensitive     VANCOMYCIN 1 SENSITIVE Sensitive     TETRACYCLINE <=1 SENSITIVE Sensitive     MOXIFLOXACIN <=0.25 SENSITIVE Sensitive     * MODERATE STAPHYLOCOCCUS AUREUS  Urine culture     Status: None   Collection Time: 12/20/14 11:58 AM  Result Value Ref Range Status   Specimen Description URINE, RANDOM  Final   Special Requests NONE  Final   Culture NO GROWTH 1 DAY  Final   Report Status 12/21/2014 FINAL  Final  Culture, blood (routine x 2)     Status: None (Preliminary result)   Collection Time: 12/20/14  2:40 PM  Result Value Ref Range Status   Specimen Description BLOOD LEFT ANTECUBITAL  Final   Special Requests BOTTLES DRAWN AEROBIC ONLY [place]  Final   Culture NO GROWTH < 24 HOURS  Final   Report Status PENDING  Incomplete  Culture, blood (routine x 2)     Status: None (Preliminary result)   Collection Time: 12/20/14  2:55 PM  Result Value Ref Range Status   Specimen Description BLOOD LEFT ANTECUBITAL  Final   Special Requests BOTTLES DRAWN AEROBIC ONLY [place]  Final   Culture NO GROWTH < 24 HOURS  Final   Report Status PENDING  Incomplete     Studies:   Recent x-ray studies have been reviewed in detail by the Attending  Physician  Scheduled Meds:  Scheduled Meds: . ampicillin-sulbactam (UNASYN) IV  3 g Intravenous Q8H  . benztropine  1 mg Oral BID  . enoxaparin (LOVENOX) injection  40 mg Subcutaneous Q24H  . famotidine  20 mg Oral Daily  . hydrALAZINE   10 mg Oral 3 times per day  . potassium chloride  20 mEq Oral Daily  . sertraline  150 mg Oral Daily  . Valproic Acid  125 mg Oral BID    Time spent on care of this patient: 35 mins   Corrie Brannen T , MD   [facility] Office  [phone] Pager - Text Page per Shea Evans as per below:  On-Call/Text Page:      [email or web address]      password [email or web address]  If 7PM-7AM, please contact night-coverage [email or web address] Password [email or web address] 12/21/2014, 2:40 PM   LOS: 9 days

## 2014-12-21 NOTE — Progress Notes (Signed)
Speech Language Pathology Treatment: Dysphagia  Patient Details Name: Kevin Humphrey MRN: [id number] DOB: 1950/07/20 Today's Date: 12/21/2014 Time: 6144-3154 SLP Time Calculation (min) (ACUTE ONLY): 21 min  Assessment / Plan / Recommendation Clinical Impression  Pt required moderate and frequent verbal cues to open oral cavity wider during trials of nectar and crushed meds in puree and liquid potassium mixed in applesauce. Decreased laryngeal elevation noted during manual palpation. Vocal quality clear throughout, no cough or throat clear. Continues to be febrile (101). Continue Dys 2, nectar, crush meds with full supervision and ST.   HPI Other Pertinent Information: Pt. has a PMH of schizophrenia, sepsis, respiratory failure, dysphagia, PNA, and GERD. Pt. was admitted when he was found in an AMS and gargling when breathing; pt has aspiration pna (per [facility] report). Pt was intubated in the ED on 12/12/14 and extubated on 12/16/14 (10am). Pt. referred to speech for BSE, prior to admission he was scheduled for a swallow evaluation at SNF.    Pertinent Vitals Pain Assessment: No/denies pain  SLP Plan  Continue with current plan of care    Recommendations Diet recommendations: Dysphagia 2 (fine chop);Nectar-thick liquid Liquids provided via: Cup Medication Administration: Crushed with puree Supervision: Staff to assist with self feeding;Full supervision/cueing for compensatory strategies Compensations: Slow rate;Small sips/bites;Check for pocketing Postural Changes and/or Swallow Maneuvers: Seated upright 90 degrees              Oral Care Recommendations: Oral care BID Follow up Recommendations: Skilled Nursing facility Plan: Continue with current plan of care         [place] Siren 12/21/2014, 10:03 AM  Orbie Pyo Colvin Caroli.Ed [employer] [phone]

## 2014-12-22 DIAGNOSIS — E46 Unspecified protein-calorie malnutrition: Secondary | ICD-10-CM

## 2014-12-22 DIAGNOSIS — D638 Anemia in other chronic diseases classified elsewhere: Secondary | ICD-10-CM | POA: Diagnosis present

## 2014-12-22 DIAGNOSIS — G9340 Encephalopathy, unspecified: Secondary | ICD-10-CM | POA: Diagnosis present

## 2014-12-22 DIAGNOSIS — R7881 Bacteremia: Secondary | ICD-10-CM | POA: Diagnosis present

## 2014-12-22 DIAGNOSIS — E870 Hyperosmolality and hypernatremia: Secondary | ICD-10-CM | POA: Diagnosis present

## 2014-12-22 DIAGNOSIS — J189 Pneumonia, unspecified organism: Secondary | ICD-10-CM | POA: Diagnosis present

## 2014-12-22 DIAGNOSIS — Z9911 Dependence on respirator [ventilator] status: Secondary | ICD-10-CM | POA: Diagnosis present

## 2014-12-22 DIAGNOSIS — N179 Acute kidney failure, unspecified: Secondary | ICD-10-CM | POA: Diagnosis present

## 2014-12-22 DIAGNOSIS — N3001 Acute cystitis with hematuria: Secondary | ICD-10-CM

## 2014-12-22 DIAGNOSIS — I10 Essential (primary) hypertension: Secondary | ICD-10-CM | POA: Diagnosis present

## 2014-12-22 LAB — CBC
HCT: 33.9 % — ABNORMAL LOW (ref 39.0–52.0)
Hemoglobin: 10.5 g/dL — ABNORMAL LOW (ref 13.0–17.0)
MCH: 29.5 pg (ref 26.0–34.0)
MCHC: 31 g/dL (ref 30.0–36.0)
MCV: 95.2 fL (ref 78.0–100.0)
PLATELETS: 418 10*3/uL — AB (ref 150–400)
RBC: 3.56 MIL/uL — ABNORMAL LOW (ref 4.22–5.81)
RDW: 15.1 % (ref 11.5–15.5)
WBC: 12.1 10*3/uL — ABNORMAL HIGH (ref 4.0–10.5)

## 2014-12-22 LAB — COMPREHENSIVE METABOLIC PANEL
ALK PHOS: 48 U/L (ref 38–126)
ALT: 67 U/L — AB (ref 17–63)
AST: 40 U/L (ref 15–41)
Albumin: 2.4 g/dL — ABNORMAL LOW (ref 3.5–5.0)
Anion gap: 6 (ref 5–15)
BUN: 27 mg/dL — AB (ref 6–20)
CALCIUM: 8.4 mg/dL — AB (ref 8.9–10.3)
CHLORIDE: 116 mmol/L — AB (ref 101–111)
CO2: 29 mmol/L (ref 22–32)
CREATININE: 0.92 mg/dL (ref 0.61–1.24)
GFR calc Af Amer: >60 mL/min (ref >60)
GFR calc non Af Amer: >60 mL/min (ref >60)
Glucose, Bld: 137 mg/dL — ABNORMAL HIGH (ref 65–99)
Potassium: 3.8 mmol/L (ref 3.5–5.1)
SODIUM: 151 mmol/L — AB (ref 135–145)
Total Bilirubin: 0.6 mg/dL (ref 0.3–1.2)
Total Protein: 6.5 g/dL (ref 6.5–8.1)

## 2014-12-22 NOTE — Progress Notes (Signed)
[place] TEAM 1 - Stepdown/ICU TEAM Progress Note  Kevin Humphrey OBS:[id number] DOB: 30-Jan-1951 DOA: 12/12/2014 PCP: [name], MD  Admit HPI / Brief Narrative: 64 y/o man PMHx depression, Schizophrenia, Dementia and possible Seizure DO, prior PNA.  Brought to the ED after the staff in the nursing home found him in an altered state of consciousness. He was found to have "gargling" in his breathing, with decreased responsiveness in the night of admission, and was EMS was called. The patient was found to be hypoxic, and was intubated after arrival in the ED. He was also febrile and hypotensive, so he was cultured, a central was placed, and he was given IV antibiotics. PCCM was called for admission. A call to his listed on-call contact for guardian did not yield any return calls.  HPI/Subjective:  8/30 A/O 0 will not follow commands with draws to pain  Assessment/Plan: Acute respiratory failure with hypoxia / VDRF - HCAP v/s aspiration PNA  - respiratory culture positive for MSSA  - intubated from 8/20 > 8/24 - Continue to wean oxygen support as possible - requiring 4 L nasal cannula at present time - pt unable to cooperate with pulm toiletry due to significant dementia   Septic shock secondary to HCAP +/- UTI  - pt has MSSA positive sputum Cx  - on Unasyn per PCCM to complete therapy for 10 days  - weaned off pressors 8/21  Aerococcus UTI - should be covered w/ current medical tx   Diptheroids in 2 of 2 blood cx 8/20 - ?contaminant - not usually a pathogenic organism, but cultures reportedly from 2 different sources - f/u blood cx unrevealing - follow clinically   Acute kidney injury - pre renal in etiology secondary to the above - resolved  Hypernatremia - Na is climbing - provide free water via IV and follow trend (oral intake limited w/ nectar-thick liquid restriction)  Moderate protein calorie malnutrition  - appreciate nutritionist assistance - dys 2 diet  if pt able to tolerate   Accelerated HTN - Blood pressure variable but overall appears to be improving - follow trend without change in treatment plan today  Anemia of critical illness - Hg remains stable - no sign of active bleeding  Acute encephalopathy - secondary to critical illness - pt also with underlying dementia and schizophrenia - speaks very little at baseline  - home medications klonopin, Risperdal, Nuedexta held since admission - slowly resuming   Functional quadriplegia - attempt PT/OT, OOB to chair   Code Status: FULL Family Communication: no family present at time of exam Disposition Plan: Still trying to contact healthcare part of attorney at the state    Consultants: PCCM > [facility] Palliative Care  Procedure/Significant Events: 8/20 - Intubated in ED 8/21 - Weaned off pressors overnight. Sedated on vent, awakens but does not follow commands  8/22 - Still on vent. Off pressors. Psych meds restarted. On fent gtt -> RASS -2, moves all 4s. Failed SBT x 2. MRSA PCR + 8/23 - Off pressors. Did do SBT for 2-3h but now failing. Hypoactive delirium +. Rt IJ site bruise. Cx resp with MSSA, blood GPR 8/24 - different microbes from different sources, narrow to Unasyn depending on blood sensit. Doing SBT 8/25 - Needed biPAP overnight. Now on 6L [place] 8/27 - [facility] assumed care    Culture  Antibiotics: Ceftazidime 8/20 > 8/22 Vancomycin 8/20 > 8/24 Clindamycin 8/21 Unasyn 8/24 >   DVT prophylaxis: Lovenox   Devices    LINES /  TUBES:      Continuous Infusions: . dextrose 5 % and 0.2 % NaCl 75 mL/hr at 12/22/14 0327    Objective: VITAL SIGNS: Temp: 97.8 F (36.6 C) (08/30 1946) Temp Source: Axillary (08/30 1946) BP: 163/77 mmHg (08/30 1715) Pulse Rate: 74 (08/30 1946) SPO2; FIO2:   Intake/Output Summary (Last 24 hours) at 12/22/14 2120 Last data filed at 12/22/14 1821  Gross per 24 hour  Intake 1901.25 ml  Output      0 ml  Net 1901.25 ml      Exam: General:  A/O 0 will not follow commands with draws to pain, No acute respiratory distress Eyes: negative scleral hemorrhage ENT: Negative Runny nose, negative gingival bleeding, Neck:  Negative scars, masses, torticollis, lymphadenopathy, JVD Lungs: Clear to auscultation bilaterally without wheezes or crackles Cardiovascular: Regular rate and rhythm without murmur gallop or rub normal S1 and S2 Abdomen:negative abdominal pain, negative dysphagia, nondistended, positive soft, bowel sounds, no rebound, no ascites, no appreciable mass Extremities: No significant cyanosis, clubbing, or edema bilateral lower extremities Psychiatric:  Unable to assess  Neurologic:  Unable to assess .   Data Reviewed: Basic Metabolic Panel:  Recent Labs Lab 12/16/14 0013  12/17/14 3845 12/18/14 0550 12/19/14 0425 12/20/14 0437 12/21/14 0508 12/22/14 0530  NA  --   < > 139 140 144 151* 157* 151*  K  --   < > 4.4 4.4 3.7 3.8 3.6 3.8  CL  --   < > 100* 100* 105 111 117* 116*  CO2  --   < > 31 31 30 31  32 29  GLUCOSE  --   < > 103* 108* 119* 126* 130* 137*  BUN  --   < > 23* 29* 32* 37* 30* 27*  CREATININE  --   < > 0.87 0.92 0.83 1.10 0.99 0.92  CALCIUM  --   < > 8.6* 8.7* 8.8* 8.6* 8.7* 8.4*  MG 1.6*  --  2.0 2.2 2.5* 2.5*  --   --   PHOS 2.8  --  3.9 3.9 3.7 4.1  --   --   < > = values in this interval not displayed. Liver Function Tests:  Recent Labs Lab 12/22/14 0530  AST 40  ALT 67*  ALKPHOS 48  BILITOT 0.6  PROT 6.5  ALBUMIN 2.4*   No results for input(s): LIPASE, AMYLASE in the last 168 hours.  Recent Labs Lab 12/18/14 0551  AMMONIA 38*   CBC:  Recent Labs Lab 12/16/14 0500 12/17/14 0521 12/18/14 0550 12/20/14 0437 12/21/14 0508 12/22/14 0530  WBC 6.9 7.8 9.7 13.4* 13.0* 12.1*  NEUTROABS 3.5 3.9 5.6  --   --   --   HGB 9.6* 10.6* 10.6* 11.2* 11.2* 10.5*  HCT 28.8* 31.8* 32.0* 34.5* 35.4* 33.9*  MCV 88.6 90.6 89.9 92.5 94.1 95.2  PLT 214 281 373 458*  466* 418*   Cardiac Enzymes: No results for input(s): CKTOTAL, CKMB, CKMBINDEX, TROPONINI in the last 168 hours. BNP (last 3 results) No results for input(s): BNP in the last 8760 hours.  ProBNP (last 3 results) No results for input(s): PROBNP in the last 8760 hours.  CBG:  Recent Labs Lab 12/17/14 1512 12/17/14 1931 12/17/14 2328 12/18/14 0418 12/18/14 0743  GLUCAP 111* 129* 108* 103* 116*    Recent Results (from the past 240 hour(s))  Urine culture     Status: None   Collection Time: 12/20/14 11:58 AM  Result Value Ref Range Status   Specimen Description  URINE, RANDOM  Final   Special Requests NONE  Final   Culture NO GROWTH 1 DAY  Final   Report Status 12/21/2014 FINAL  Final  Culture, blood (routine x 2)     Status: None (Preliminary result)   Collection Time: 12/20/14  2:40 PM  Result Value Ref Range Status   Specimen Description BLOOD LEFT ANTECUBITAL  Final   Special Requests BOTTLES DRAWN AEROBIC ONLY [place]  Final   Culture NO GROWTH 2 DAYS  Final   Report Status PENDING  Incomplete  Culture, blood (routine x 2)     Status: None (Preliminary result)   Collection Time: 12/20/14  2:55 PM  Result Value Ref Range Status   Specimen Description BLOOD LEFT ANTECUBITAL  Final   Special Requests BOTTLES DRAWN AEROBIC ONLY [place]  Final   Culture NO GROWTH 2 DAYS  Final   Report Status PENDING  Incomplete     Studies:  Recent x-ray studies have been reviewed in detail by the Attending Physician  Scheduled Meds:  Scheduled Meds: . ampicillin-sulbactam (UNASYN) IV  3 g Intravenous Q8H  . benztropine  1 mg Oral BID  . enoxaparin (LOVENOX) injection  40 mg Subcutaneous Q24H  . famotidine  20 mg Oral Daily  . hydrALAZINE  10 mg Oral 3 times per day  . potassium chloride  20 mEq Oral Daily  . sertraline  150 mg Oral Daily  . Valproic Acid  125 mg Oral BID    Time spent on care of this patient: 40 mins   WOODS, Geraldo Docker , MD  [facility] Office   [phone] Pager [phone]  On-Call/Text Page:      [email or web address]      password [email or web address]  If 7PM-7AM, please contact night-coverage [email or web address] Password [email or web address] 12/22/2014, 9:20 PM   LOS: 10 days   Care during the described time interval was provided by me .  I have reviewed this patient's available data, including medical history, events of note, physical examination, and all test results as part of my evaluation. I have personally reviewed and interpreted all radiology studies.   Dia Crawford, MD [phone] Pager

## 2014-12-22 NOTE — Care Management Important Message (Signed)
Important Message  Patient Details  Name: Kevin Humphrey MRN: [id number] Date of Birth: 07-16-1950   Medicare Important Message Given:  Yes-third notification given    Girard Cooter, RN 12/22/2014, 12:47 PM

## 2014-12-22 NOTE — Progress Notes (Signed)
SLP Cancellation Note  Patient Details Name: Kevin Humphrey MRN: [id number] DOB: Aug 29, 1950   Cancelled treatment:        Attempted dysphagia intervention earlier today and pt unable to adequately arouse. Will continue efforts.    [place] Siren 12/22/2014, 2:46 PM

## 2014-12-23 DIAGNOSIS — I10 Essential (primary) hypertension: Secondary | ICD-10-CM

## 2014-12-23 MED ORDER — LACTULOSE 10 GM/15ML PO SOLN
20.0000 g | Freq: Three times a day (TID) | ORAL | Status: DC
  Administered 2014-12-23 – 2014-12-25 (×4): 20 g via ORAL
  Filled 2014-12-23 (×6): qty 30

## 2014-12-23 MED ORDER — DIVALPROEX SODIUM 125 MG PO CPSP: 125.0000 mg | ORAL_CAPSULE | Freq: Two times a day (BID) | ORAL | Status: DC

## 2014-12-23 MED ORDER — RISPERIDONE 1 MG PO TABS: 4.0000 mg | ORAL_TABLET | Freq: Two times a day (BID) | ORAL | Status: DC

## 2014-12-23 MED ORDER — AMPICILLIN-SULBACTAM SODIUM 3 (2-1) G IJ SOLR
3.0000 g | Freq: Three times a day (TID) | INTRAMUSCULAR | Status: AC
  Administered 2014-12-23 – 2014-12-25 (×6): 3 g via INTRAVENOUS
  Filled 2014-12-23 (×6): qty 3

## 2014-12-23 MED ORDER — DEXTROMETHORPHAN-QUINIDINE 20-10 MG PO CAPS: 1.0000 | ORAL_CAPSULE | Freq: Two times a day (BID) | ORAL | Status: DC

## 2014-12-23 MED ORDER — RISPERIDONE 1 MG PO TABS: 1.0000 mg | ORAL_TABLET | Freq: Two times a day (BID) | ORAL | Status: DC

## 2014-12-23 MED ORDER — RISPERIDONE 1 MG PO TABS
1.0000 mg | ORAL_TABLET | Freq: Two times a day (BID) | ORAL | Status: DC
  Administered 2014-12-23: 1 mg via ORAL
  Filled 2014-12-23 (×6): qty 1

## 2014-12-23 NOTE — Progress Notes (Signed)
Missed call from [place] on 6E at 13:52. Was in procedure with MD. Returned call to unit and gave report at 1437. Report given to Cybill.  Patient will be transferred via bed to [place] with belongings. At request of guardian, name and number of the patient's step sister was given to transfer nurse. She wishes to receive information regarding patient disposition prior to making DNR designation. Guardian further states that the patient is very different from his baseline. She states he used to be very awake and alert, using a motorized wheelchair and always very excited to get his next meal. As opposed to his current state, very nonverbal and difficult to assist with feeds.

## 2014-12-23 NOTE — Progress Notes (Signed)
Speech Language Pathology Treatment: Dysphagia  Patient Details Name: Kevin Humphrey MRN: [id number] DOB: [birth year] Today's Date: 12/23/2014 Time: 9767-3419 SLP Time Calculation (min) (ACUTE ONLY): 17 min  Assessment / Plan / Recommendation Clinical Impression  Pt awake today, however unable to communicate any needs with encouragement for head nods or thumbs up (spoke several words during this SLP's initial session). Requires mod-max cues to look at therapist, open mouth wider for nectar thick orange juice. Likely intermittent penetration /aspiration indicated by delayed cough (may be having more penetrates than exhibits?). Palliative care noted pt less responsive today (was using head gestures). SLP recommends continuing Dys 1, nectar. ST will sign off- reconsult if needed.    HPI Other Pertinent Information: Pt. has a PMH of schizophrenia, sepsis, respiratory failure, dysphagia, PNA, and GERD. Pt. was admitted when he was found in an AMS and gargling when breathing; pt has aspiration pna (per [facility] report). Pt was intubated in the ED on 12/12/14 and extubated on 12/16/14 (10am). Pt. referred to speech for BSE, prior to admission he was scheduled for a swallow evaluation at SNF.    Pertinent Vitals Pain Assessment:  (non indications)  SLP Plan  Discharge SLP treatment due to (comment)    Recommendations Diet recommendations: Dysphagia 1 (puree);Nectar-thick liquid Liquids provided via: Cup Medication Administration: Crushed with puree Supervision: Staff to assist with self feeding;Full supervision/cueing for compensatory strategies Compensations: Slow rate;Small sips/bites;Check for pocketing Postural Changes and/or Swallow Maneuvers: Seated upright 90 degrees              Oral Care Recommendations: Oral care BID Follow up Recommendations: Skilled Nursing facility Plan: Discharge SLP treatment due to (comment)         [place] Siren 12/23/2014, 10:07 AM  Orbie Pyo Colvin Caroli.Ed [employer] [phone]

## 2014-12-23 NOTE — Progress Notes (Signed)
Called to give report on patient going to 6E01. Gave number and name, waiting for return call from [place], Therapist, sports.

## 2014-12-23 NOTE — Progress Notes (Signed)
Daily Progress Note   Patient Name: Kevin Humphrey       Date: 12/23/2014 DOB: Aug 23, 1950  Age: 64 y.o. MRN#: [id number] Attending Physician: Cherene Altes, MD Primary Care Physician: [name], MD Admit Date: 12/12/2014  Reason for Consultation/Follow-up: GOC  Subjective:     Kevin Humphrey appears unfortunately declined. He is much less responsive - not nodding his head or verbal but he does make eye contact with me. Not holding head up and his color appears worse. Per nursing he is not eating much and having taking medications is a struggle. At this point I would recommend hospice facility.   I have completed/notarized paperwork and sent to DSS with recommendations for DNR, comfort, and hospice. I did receive a call back from Neysa Hotter with DSS (supervisor for his legal guardian Kevin Humphrey). She tells me that they are attempting to reach his siblings to discuss these recommendations. They have my cell phone to contact me for any other information or any other way I can help with this process.    Length of Stay: 11 days  Current Medications: Scheduled Meds:  . benztropine  1 mg Oral BID  . enoxaparin (LOVENOX) injection  40 mg Subcutaneous Q24H  . famotidine  20 mg Oral Daily  . hydrALAZINE  10 mg Oral 3 times per day  . potassium chloride  20 mEq Oral Daily  . sertraline  150 mg Oral Daily  . Valproic Acid  125 mg Oral BID    Continuous Infusions: . dextrose 5 % and 0.2 % NaCl 75 mL/hr at 12/22/14 2349    PRN Meds: acetaminophen, antiseptic oral rinse, food thickener, hydrALAZINE  Palliative Performance Scale: 20%     Vital Signs: BP 107/52 mmHg  Pulse 66  Temp(Src) 99 F (37.2 C) (Axillary)  Resp 25  Ht 5\' 11"  (1.803 m)  Wt 83.9 kg (184 lb 15.5 oz)  BMI 25.81 kg/m2  SpO2 93% SpO2: SpO2: 93 % O2 Device: O2 Device: Nasal Cannula O2 Flow Rate: O2 Flow Rate (L/min): 4 L/min  Intake/output summary:  Intake/Output Summary (Last 24 hours) at 12/23/14  1018 Last data filed at 12/23/14 0700  Gross per 24 hour  Intake 1326.25 ml  Output      0 ml  Net 1326.25 ml   LBM: Last BM Date: 12/20/14 Baseline Weight: Weight: 95.709 kg (211 lb) Most recent weight: Weight: 83.9 kg (184 lb 15.5 oz)  Physical Exam: General: Appears fatigued, chronically ill appearing Resp: No labored breathing Neuro: Awake, makes eye contact, no response to questions, not following commands   Additional Data Reviewed: Recent Labs     12/21/14  0508  12/22/14  0530  WBC  13.0*  12.1*  HGB  11.2*  10.5*  PLT  466*  418*  NA  157*  151*  BUN  30*  27*  CREATININE  0.99  0.92     Problem List:  Patient Active Problem List   Diagnosis Date Noted  . HCAP (healthcare-associated pneumonia)   . Ventilator dependent   . Acute cystitis with hematuria   . Bacteremia   . Accelerated hypertension   . Anemia, chronic disease   . Acute kidney injury   . Hypernatremia   . Protein-calorie malnutrition   . Acute encephalopathy   . Palliative care encounter 12/17/2014  . Dysphagia 12/17/2014  . Acute respiratory failure with hypoxia 12/14/2014  . Staphylococcal pneumonia 12/14/2014  . Septic shock 12/14/2014  . AKI (acute kidney injury)  12/14/2014  . Respiratory failure   . Pneumonia 12/12/2014  . Malnutrition of moderate degree 12/12/2014  . Pressure ulcer 12/12/2014  . Convulsions/seizures 08/20/2013  . Other and unspecified hyperlipidemia 08/02/2012  . GERD (gastroesophageal reflux disease) 08/02/2012  . Schizophrenia 08/02/2012  . Depression 08/02/2012  . Allergic rhinitis 08/02/2012  . Dysphagia, unspecified(787.20) 08/02/2012  . Vertebral compression fracture 08/02/2012     Palliative Care Assessment & Plan    Code Status:  FULL - in process for DNR with DSS. I strongly recommend DNR.   Goals of Care:  I recommend comfort care at this time. Awaiting DSS approval for recommendations.   3. Symptom Management:  No symptoms to manage at  this time. Appears comfortable - but miserable.    5. Prognosis: Likely weeks considering decreased intake.   5. Discharge Planning: Recommend hospice facility.    Care plan was discussed with Dr. Thereasa Solo, DSS worker Neysa Hotter.   Thank you for allowing the Palliative Medicine Team to assist in the care of this patient.   Time In: 1000 Time Out: 1030 Total Time 43min Prolonged Time Billed  no     Greater than 50%  of this time was spent counseling and coordinating care related to the above assessment and plan.   Vinie Sill, NP Palliative Medicine Team Pager # [phone] (M-F 8a-5p) Team Phone # [phone] (Nights/Weekends)  12/23/2014, 10:18 AM

## 2014-12-23 NOTE — Progress Notes (Signed)
Admission note:   Arrival Method: Transfer from [facility]. Mental Status: Barely responsive to painful stimuli.  Telemetry: N/A. Skin: Intact. Skin tear/scabs noted to left forearm.  Generalized non-pitting edema.  Tubes: 3 L O2 n/c in place. IV: RFA D5 1/4 NS @ 50 mls/hr. Pain: PAINAD 0.  Family: No one at bedside. Patient is a ward of the state. Living Situation: From [place]. Safety Measures: Call bell within reach, bed alarm in place. Siderails x3 up. 6E Orientation: Unable to orient patient at this time.  Spoke to Dr. Thereasa Solo upon patient's arrival to unit and clarified that patient is a FULL CODE and is currently stable, despite being unresponsive. MD states this appears to be patient's baseline at this time. Will continue to monitor closely.  Joellen Jersey, RN.

## 2014-12-23 NOTE — Progress Notes (Addendum)
[facility] TEAM 1 - Stepdown/ICU TEAM PROGRESS NOTE  Kevin Humphrey XNA:[id number] DOB: Sep 06, 1950 DOA: 12/12/2014 PCP: [name], MD  Admit HPI / Brief Narrative: 64 y/o man (ward of state) w/ hx of schizophrenia and prior PNA who presented w/ [facility] and was found to have severe sepsis of pulmonary origin with respiratory failure.  Significant Events: 8/20 - Intubated in ED 8/21 - Weaned off pressors overnight. Sedated on vent, awakens but does not follow commands  8/22 - Still on vent. Off pressors. Psych meds restarted. On fent gtt -> RASS -2, moves all 4s. Failed SBT x 2. MRSA PCR + 8/23 - Off pressors. Did do SBT for 2-3h but now failing. Hypoactive delirium +. Rt IJ site bruise. Cx resp with MSSA, blood GPR 8/24 - different microbes from different sources, narrow to Unasyn depending on blood sensit. Doing SBT 8/25 - Needed biPAP overnight. Now on 6L [place] 8/27 - [facility] assumed care   HPI/Subjective: The pt is much less responsive today, compared to 8/29.  He opens one of his eyes, but does not follow the examiner and does not move his head.  He does not appear to be uncomfortable.  He can not provide a hx.   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  - secondary to critical illness with underlying dementia and schizophrenia - speaks very little at baseline per reports - home medications risperdal, depakote have been resumed  - unlikely his mental status will improve further at this time - ongoing aggressive care is no longer appropriate given his perceived very low quality of life, and progressive terminal illness (dementia) - PC is following w/ [place] and we are pursuing permission from state to transition to comfort focused care - will resume lactulose and check ammonia level in AM, though nursing home records suggest his ammonia level has not correlated with his mental status in the past  Acute respiratory failure with hypoxia / VDRF - HCAP v/s  aspiration PNA  - respiratory culture positive for MSSA  - intubated from 8/20 > 8/24 - continue to wean oxygen support as able - requiring 4 L nasal cannula at present time - pt unable to cooperate with pulm toiletry due to significant dementia   Septic shock secondary to HCAP +/- UTI  - pt has MSSA positive sputum Cx  - on Unasyn per PCCM to complete therapy for 10 days  - weaned off pressors 8/21  Aerococcus UTI - should be covered w/ current medical tx   Diptheroids in 2 of 2 blood cx 8/20 - ?contaminant - not usually a pathogenic organism, but cultures reportedly from 2 different sources - f/u blood cx unrevealing - follow clinically   Acute kidney injury - pre renal in etiology secondary to the above - resolved  Hypernatremia - Na improving w/ increased free water via IV - follow trend (oral intake limited w/ nectar-thick liquid restriction)  Moderate protein calorie malnutrition  - appreciate nutritionist assistance - dys 2 diet if pt able to tolerate - alternate means of nutrition (PEG / NG) would not be appropriate given his severe/terminal dementia   Accelerated HTN - Blood pressure has stabilized   Anemia of critical illness - Hg remains stable - no sign of active bleeding  Functional quadriplegia - attempt PT/OT, OOB to chair  MRSA Screen +   Code Status: FULL Family Communication: no family present at time of exam Disposition Plan: stable for transfer to med bed   Consultants: PCCM >  [facility] Palliative Care   Antibiotics: Ceftazidime 8/20 > 8/22 Vancomycin 8/20 > 8/24 Clindamycin 8/21 Unasyn 8/24 >   DVT prophylaxis: Lovenox  Objective: Blood pressure 107/52, pulse 66, temperature 99 F (37.2 C), temperature source Axillary, resp. rate 25, height 5\' 11"  (1.803 m), weight 83.9 kg (184 lb 15.5 oz), SpO2 93 %.  Intake/Output Summary (Last 24 hours) at 12/23/14 1045 Last data filed at 12/23/14 0700  Gross per 24 hour  Intake 1326.25 ml  Output       0 ml  Net 1326.25 ml   Exam: General: No acute respiratory distress  Lungs: Mild bibasilar crackles without wheeze - respirations are calm / appear comfortable  Cardiovascular: Regular rate and rhythm without murmur gallop or rub  Abdomen: Nontender, nondistended, soft, bowel sounds positive, no rebound, no ascites, no appreciable mass Extremities: No significant cyanosis, clubbing;  1+ edema bilateral lower extremities  Data Reviewed: Basic Metabolic Panel:  Recent Labs Lab 12/17/14 0521 12/18/14 0550 12/19/14 0425 12/20/14 0437 12/21/14 0508 12/22/14 0530  NA 139 140 144 151* 157* 151*  K 4.4 4.4 3.7 3.8 3.6 3.8  CL 100* 100* 105 111 117* 116*  CO2 31 31 30 31  32 29  GLUCOSE 103* 108* 119* 126* 130* 137*  BUN 23* 29* 32* 37* 30* 27*  CREATININE 0.87 0.92 0.83 1.10 0.99 0.92  CALCIUM 8.6* 8.7* 8.8* 8.6* 8.7* 8.4*  MG 2.0 2.2 2.5* 2.5*  --   --   PHOS 3.9 3.9 3.7 4.1  --   --     CBC:  Recent Labs Lab 12/17/14 0521 12/18/14 0550 12/20/14 0437 12/21/14 0508 12/22/14 0530  WBC 7.8 9.7 13.4* 13.0* 12.1*  NEUTROABS 3.9 5.6  --   --   --   HGB 10.6* 10.6* 11.2* 11.2* 10.5*  HCT 31.8* 32.0* 34.5* 35.4* 33.9*  MCV 90.6 89.9 92.5 94.1 95.2  PLT 281 373 458* 466* 418*   Liver Function Tests:  Recent Labs Lab 12/22/14 0530  AST 40  ALT 67*  ALKPHOS 48  BILITOT 0.6  PROT 6.5  ALBUMIN 2.4*    Recent Labs Lab 12/18/14 0551  AMMONIA 38*   CBG:  Recent Labs Lab 12/17/14 1512 12/17/14 1931 12/17/14 2328 12/18/14 0418 12/18/14 0743  GLUCAP 111* 129* 108* 103* 116*    Recent Results (from the past 240 hour(s))  Urine culture     Status: None   Collection Time: 12/20/14 11:58 AM  Result Value Ref Range Status   Specimen Description URINE, RANDOM  Final   Special Requests NONE  Final   Culture NO GROWTH 1 DAY  Final   Report Status 12/21/2014 FINAL  Final  Culture, blood (routine x 2)     Status: None (Preliminary result)   Collection Time:  12/20/14  2:40 PM  Result Value Ref Range Status   Specimen Description BLOOD LEFT ANTECUBITAL  Final   Special Requests BOTTLES DRAWN AEROBIC ONLY [place]  Final   Culture NO GROWTH 2 DAYS  Final   Report Status PENDING  Incomplete  Culture, blood (routine x 2)     Status: None (Preliminary result)   Collection Time: 12/20/14  2:55 PM  Result Value Ref Range Status   Specimen Description BLOOD LEFT ANTECUBITAL  Final   Special Requests BOTTLES DRAWN AEROBIC ONLY [place]  Final   Culture NO GROWTH 2 DAYS  Final   Report Status PENDING  Incomplete     Studies:   Recent x-ray studies have been reviewed  in detail by the Attending Physician  Scheduled Meds:  Scheduled Meds: . benztropine  1 mg Oral BID  . enoxaparin (LOVENOX) injection  40 mg Subcutaneous Q24H  . famotidine  20 mg Oral Daily  . hydrALAZINE  10 mg Oral 3 times per day  . potassium chloride  20 mEq Oral Daily  . sertraline  150 mg Oral Daily  . Valproic Acid  125 mg Oral BID    Time spent on care of this patient: 35 mins   Jillianne Gamino T , MD   [facility] Office  [phone] Pager - Text Page per Shea Evans as per below:  On-Call/Text Page:      [email or web address]      password [email or web address]  If 7PM-7AM, please contact night-coverage [email or web address] Password [email or web address] 12/23/2014, 10:45 AM   LOS: 11 days

## 2014-12-24 DIAGNOSIS — E870 Hyperosmolality and hypernatremia: Secondary | ICD-10-CM

## 2014-12-24 DIAGNOSIS — R7881 Bacteremia: Secondary | ICD-10-CM

## 2014-12-24 DIAGNOSIS — J189 Pneumonia, unspecified organism: Secondary | ICD-10-CM

## 2014-12-24 LAB — COMPREHENSIVE METABOLIC PANEL
ALBUMIN: 2.4 g/dL — AB (ref 3.5–5.0)
ALT: 40 U/L (ref 17–63)
AST: 23 U/L (ref 15–41)
Alkaline Phosphatase: 51 U/L (ref 38–126)
Anion gap: 7 (ref 5–15)
BILIRUBIN TOTAL: 0.5 mg/dL (ref 0.3–1.2)
BUN: 19 mg/dL (ref 6–20)
CHLORIDE: 117 mmol/L — AB (ref 101–111)
CO2: 24 mmol/L (ref 22–32)
CREATININE: 0.69 mg/dL (ref 0.61–1.24)
Calcium: 8.3 mg/dL — ABNORMAL LOW (ref 8.9–10.3)
GFR calc Af Amer: >60 mL/min (ref >60)
GFR calc non Af Amer: >60 mL/min (ref >60)
GLUCOSE: 110 mg/dL — AB (ref 65–99)
POTASSIUM: 3.4 mmol/L — AB (ref 3.5–5.1)
Sodium: 148 mmol/L — ABNORMAL HIGH (ref 135–145)
Total Protein: 6 g/dL — ABNORMAL LOW (ref 6.5–8.1)

## 2014-12-24 LAB — AMMONIA: Ammonia: 29 umol/L (ref 9–35)

## 2014-12-24 MED ORDER — KCL IN DEXTROSE-NACL 20-5-0.2 MEQ/L-%-% IV SOLN
INTRAVENOUS | Status: DC
  Administered 2014-12-24: 12:00:00 via INTRAVENOUS
  Filled 2014-12-24 (×2): qty 1000

## 2014-12-24 NOTE — Progress Notes (Signed)
Second attempt to administer pt's med; pt unable to follow commands and instructions Therefore could not administer PO medication safely. MD made aware.

## 2014-12-24 NOTE — Progress Notes (Signed)
Patient Demographics  Kevin Humphrey, is a 64 y.o. male, DOB - [birth year], HGD:[id number]  Admit date - 12/12/2014   Admitting Physician Luz Brazen, MD  Outpatient Primary MD for the patient is [name], MD  LOS - 12   Chief Complaint  Patient presents with  . Code Sepsis       Admission HPI/Brief narrative: 64 y/o man ([place]) w/ hx of schizophrenia and prior PNA who presented w/ AMS and was found to have severe sepsis of pulmonary origin with respiratory failure.  Subjective:   [name] today is minimally responsive, nonverbal.  Assessment & Plan    Active Problems:   Pneumonia   Malnutrition of moderate degree   Pressure ulcer   Acute respiratory failure with hypoxia   Staphylococcal pneumonia   Septic shock   AKI (acute kidney injury)   Respiratory failure   Palliative care encounter   Dysphagia   HCAP (healthcare-associated pneumonia)   Ventilator dependent   Acute cystitis with hematuria   Bacteremia   Accelerated hypertension   Anemia, chronic disease   Acute kidney injury   Hypernatremia   Protein-calorie malnutrition   Acute encephalopathy  Acute encephalopathy in setting of end-stage dementia  - secondary to critical illness with underlying dementia and schizophrenia - speaks very little at baseline per reports - home medications risperdal, depakote have been resumed  - unlikely his mental status will improve further at this time - ongoing aggressive care is no longer appropriate given his perceived very low quality of life, and progressive terminal illness (dementia) - PC is following w/ [place] and we are pursuing permission from state to transition to comfort focused care - Ammonia level within normal limits.  Acute respiratory failure with hypoxia / VDRF - HCAP v/s aspiration PNA  - respiratory culture positive for MSSA  - intubated from  8/20 > 8/24 - continue to wean oxygen support as able - requiring 4 L nasal cannula at present time - pt unable to cooperate with pulm toiletry due to significant dementia   Septic shock secondary to HCAP +/- UTI  - pt has MSSA positive sputum Cx  - on Unasyn per PCCM to complete therapy for 10 days (stop date 9/2) - weaned off pressors 8/21  Aerococcus UTI - should be covered w/ current medical tx   Diptheroids in 2 of 2 blood cx 8/20 - ?contaminant - not usually a pathogenic organism, but cultures reportedly from 2 different sources - f/u blood cx unrevealing - follow clinically   Acute kidney injury - pre renal in etiology secondary to the above - resolved  Hypernatremia - Na improving w/ increased free water via IV - follow trend (oral intake limited w/ nectar-thick liquid restriction)  Moderate protein calorie malnutrition  - appreciate nutritionist assistance - dys 1 diet if pt able to tolerate - alternate means of nutrition (PEG / NG) would not be appropriate given his severe/terminal dementia   Accelerated HTN - Blood pressure has stabilized   Anemia of critical illness - Hg remains stable - no sign of active bleeding  Functional quadriplegia - attempt PT/OT, OOB to chair  Hypokalemia - Repleted with IV fluids.  Code Status: Full  Family Communication: No family present at  bedside  Disposition Plan: Palliative care following to assist with goals of care, and possible comfort care, plan would be for hospice facility if patient is changed to comfort care.   Procedures  8/20 - Intubated in ED 8/21 - Weaned off pressors overnight. Sedated on vent, awakens but does not follow commands  8/22 - Still on vent. Off pressors. Psych meds restarted. On fent gtt -> RASS -2, moves all 4s. Failed SBT x 2. MRSA PCR + 8/23 - Off pressors. Did do SBT for 2-3h but now failing. Hypoactive delirium +. Rt IJ site bruise. Cx resp with MSSA, blood GPR 8/24 - different  microbes from different sources, narrow to Unasyn depending on blood sensit. Doing SBT 8/25 - Needed biPAP overnight. Now on 6L [place] 8/27 - [facility] assumed care   Consults   PCCM > [facility] Palliative Care  Medications  Scheduled Meds: . ampicillin-sulbactam (UNASYN) IV  3 g Intravenous Q8H  . benztropine  1 mg Oral BID  . enoxaparin (LOVENOX) injection  40 mg Subcutaneous Q24H  . famotidine  20 mg Oral Daily  . hydrALAZINE  10 mg Oral 3 times per day  . lactulose  20 g Oral TID  . potassium chloride  20 mEq Oral Daily  . risperiDONE  1 mg Oral BID  . sertraline  150 mg Oral Daily  . Valproic Acid  125 mg Oral BID   Continuous Infusions: . dextrose 5 % and 0.2 % NaCl 50 mL/hr at 12/23/14 1459   PRN Meds:.acetaminophen, antiseptic oral rinse, food thickener, hydrALAZINE  DVT Prophylaxis  Lovenox -   Lab Results  Component Value Date   PLT 418* 12/22/2014    Antibiotics    Anti-infectives    Start     Dose/Rate Route Frequency Ordered Stop   12/23/14 1130  Ampicillin-Sulbactam (UNASYN) 3 g in sodium chloride 0.9 % 100 mL IVPB     3 g 100 mL/hr over 60 Minutes Intravenous Every 8 hours 12/23/14 1121 12/25/14 1129   12/16/14 1400  Ampicillin-Sulbactam (UNASYN) 3 g in sodium chloride 0.9 % 100 mL IVPB     3 g 100 mL/hr over 60 Minutes Intravenous Every 8 hours 12/16/14 1249 12/22/14 2319   12/14/14 2300  vancomycin (VANCOCIN) IVPB 1000 mg/200 mL premix  Status:  Discontinued     1,000 mg 200 mL/hr over 60 Minutes Intravenous Every 8 hours 12/14/14 1423 12/16/14 1241   12/14/14 1500  vancomycin (VANCOCIN) 1,750 mg in sodium chloride 0.9 % 500 mL IVPB     1,750 mg 250 mL/hr over 120 Minutes Intravenous  Once 12/14/14 1421 12/14/14 1706   12/13/14 2345  clindamycin (CLEOCIN) IVPB 900 mg  Status:  Discontinued     900 mg 100 mL/hr over 30 Minutes Intravenous 3 times per day 12/13/14 2336 12/14/14 1417   12/12/14 1700  vancomycin (VANCOCIN) IVPB 750 mg/150 ml premix  Status:   Discontinued     750 mg 150 mL/hr over 60 Minutes Intravenous Every 12 hours 12/12/14 0538 12/13/14 1504   12/12/14 1200  cefTAZidime (FORTAZ) 2 g in dextrose 5 % 50 mL IVPB  Status:  Discontinued     2 g 100 mL/hr over 30 Minutes Intravenous 3 times per day 12/12/14 0538 12/14/14 1417   12/12/14 0445  vancomycin (VANCOCIN) 1,500 mg in sodium chloride 0.9 % 500 mL IVPB     1,500 mg 250 mL/hr over 120 Minutes Intravenous  Once 12/12/14 0437 12/12/14 0714   12/12/14 0445  piperacillin-tazobactam (  ZOSYN) IVPB 3.375 g     3.375 g 100 mL/hr over 30 Minutes Intravenous  Once 12/12/14 0437 12/12/14 0551          Objective:   Filed Vitals:   12/23/14 1200 12/23/14 1531 12/23/14 2038 12/24/14 0504  BP: 124/81 115/88 137/97 143/81  Pulse: 65 79 80 86  Temp:  98.9 F (37.2 C) 98.3 F (36.8 C) 98.5 F (36.9 C)  TempSrc:  Axillary Axillary   Resp: 23 24 22 20   Height:      Weight:  83.9 kg (184 lb 15.5 oz)    SpO2: 97% 95% 97% 96%    Wt Readings from Last 3 Encounters:  12/23/14 83.9 kg (184 lb 15.5 oz)  02/28/13 95.709 kg (211 lb)  01/17/13 93.441 kg (206 lb)     Intake/Output Summary (Last 24 hours) at 12/24/14 0850 Last data filed at 12/24/14 0600  Gross per 24 hour  Intake 1557.91 ml  Output      0 ml  Net 1557.91 ml     Physical Exam  General: No acute respiratory distress Lungs: Mild bibasilar crackles without wheeze - respirations are calm / appear comfortable  Cardiovascular: Regular rate and rhythm without murmur gallop or rub  Abdomen: Nontender, nondistended, soft, bowel sounds positive, no rebound, no ascites, no appreciable mass Extremities: No significant cyanosis, clubbing; 1+ edema bilateral lower extremities   Data Review   Micro Results Recent Results (from the past 240 hour(s))  Urine culture     Status: None   Collection Time: 12/20/14 11:58 AM  Result Value Ref Range Status   Specimen Description URINE, RANDOM  Final   Special Requests  NONE  Final   Culture NO GROWTH 1 DAY  Final   Report Status 12/21/2014 FINAL  Final  Culture, blood (routine x 2)     Status: None (Preliminary result)   Collection Time: 12/20/14  2:40 PM  Result Value Ref Range Status   Specimen Description BLOOD LEFT ANTECUBITAL  Final   Special Requests BOTTLES DRAWN AEROBIC ONLY [place]  Final   Culture NO GROWTH 3 DAYS  Final   Report Status PENDING  Incomplete  Culture, blood (routine x 2)     Status: None (Preliminary result)   Collection Time: 12/20/14  2:55 PM  Result Value Ref Range Status   Specimen Description BLOOD LEFT ANTECUBITAL  Final   Special Requests BOTTLES DRAWN AEROBIC ONLY [place]  Final   Culture NO GROWTH 3 DAYS  Final   Report Status PENDING  Incomplete    Radiology Reports Dg Chest 2 View  12/20/2014   CLINICAL DATA:  History of schizophrenia. Dyspnea. Prior pneumonia.  EXAM: CHEST  2 VIEW  COMPARISON:  12/16/2014  FINDINGS: Extubation and removal of nasogastric tube. Right internal jugular line tip at low SVC. Remote right rib fractures. Lateral views moderately degraded secondary to positioning and artifact posteriorly. Small bilateral pleural effusion. Midline trachea. Cardiomegaly accentuated by AP portable technique. No pneumothorax. Low lung volumes. Improved patchy lower lobe predominant airspace disease, greater on the left.  IMPRESSION: Improved aeration with decreased bibasilar opacities which could represent atelectasis or infection.  Small bilateral pleural effusions are suspected ; the lateral views are degraded.   Electronically Signed   By: Abigail Miyamoto M.D.   On: 12/20/2014 12:54   Ct Head Wo Contrast  12/12/2014   CLINICAL DATA:  Nursing home patient, found unresponsive, not responding to stimuli.  EXAM: CT HEAD WITHOUT CONTRAST  TECHNIQUE: Contiguous  axial images were obtained from the base of the skull through the vertex without intravenous contrast.  COMPARISON:  None.  FINDINGS: The ventricles and sulci are normal  for age. No intraparenchymal hemorrhage, mass effect nor midline shift. Patchy supratentorial white matter hypodensities are within normal range for patient's age and though non-specific suggest sequelae of chronic small vessel ischemic disease. No acute large vascular territory infarcts.  No abnormal extra-axial fluid collections. Basal cisterns are patent. Mild calcific atherosclerosis of the carotid siphons and included vertebral arteries.  No skull fracture. The included ocular globes and orbital contents are non-suspicious. Soft tissue nearly completely opacifies the LEFT maxillary sinus with bony wall thickening consistent with acute on chronic sinusitis. Mild ethmoid mucosal thickening. The mastoid air cells are well aerated. Life support lines in place. The patient is edentulous.  IMPRESSION: Negative CT head.   Electronically Signed   By: Elon Alas M.D.   On: 12/12/2014 06:28   Dg Chest Port 1 View  12/16/2014   CLINICAL DATA:  Endotracheal tube position.  EXAM: PORTABLE CHEST - 1 VIEW  COMPARISON:  December 15, 2014.  FINDINGS: Stable cardiomediastinal silhouette. Endotracheal and nasogastric tubes are unchanged in position. Right internal jugular catheter line is again noted with distal tip overlying expected position of the SVC. No pneumothorax is noted. Old right rib fractures are noted. Stable bilateral basilar opacities are noted concerning for pneumonia or atelectasis.  IMPRESSION: Stable support apparatus. Stable bibasilar opacities concerning for pneumonia or atelectasis.   Electronically Signed   By: Marijo Conception, M.D.   On: 12/16/2014 07:14   Dg Chest Port 1 View  12/15/2014   CLINICAL DATA:  Intubation.  EXAM: PORTABLE CHEST - 1 VIEW  COMPARISON:  12/14/2014.  FINDINGS: Endotracheal tube 1.7 cm above the carina. NG tube tip below left hemidiaphragm. Right IJ line with tip in cavoatrial junction. Heart size normal. Continued progression of bibasilar atelectasis and/or infiltrates.  Tiny pleural effusions can't be excluded. No pneumothorax. Old right rib fractures are present.  IMPRESSION: 1. Endotracheal tube 1.7 cm above the carina. Proximal repositioning of 1 cm should be considered . NG tube and right IJ line in stable position. 2. Progressive bibasilar atelectasis and/or infiltrates. Tiny pleural effusions cannot be excluded. These results will be called to the ordering clinician or representative by the Radiologist Assistant, and communication documented in the PACS or zVision Dashboard.   Electronically Signed   By: Marcello Moores  Register   On: 12/15/2014 07:24   Dg Chest Port 1 View  12/14/2014   CLINICAL DATA:  Pneumonia.  Shortness of breath.  EXAM: PORTABLE CHEST - 1 VIEW  COMPARISON:  12/13/2014.  FINDINGS: Endotracheal tube, NG tube, right IJ line stable position. Cardiomegaly. Progressive bibasilar atelectasis and/or infiltrates are noted with bilateral small pleural effusions. No pneumothorax.  IMPRESSION: 1. Lines and tubes in stable position. 2. Progressive bibasilar atelectasis and/or infiltrates. Small pleural effusions. 3. Stable cardiomegaly.   Electronically Signed   By: Marcello Moores  Register   On: 12/14/2014 07:08   Dg Chest Port 1 View  12/13/2014   CLINICAL DATA:  Acute respiratory failure. On ventilator.  EXAM: PORTABLE CHEST - 1 VIEW  COMPARISON:  12/12/2014  FINDINGS: Support lines and tubes in appropriate position. No pneumothorax identified. Bibasilar pulmonary infiltrates show improvement since previous study. Heart size remains stable. Old rib fracture deformities again noted.  IMPRESSION: Improving bibasilar infiltrates since prior exam.   Electronically Signed   By: Earle Gell M.D.   On:  12/13/2014 07:31   Dg Chest Portable 1 View  12/12/2014   CLINICAL DATA:  Endotracheal tube and central line placement.  EXAM: PORTABLE CHEST - 1 VIEW  COMPARISON:  None available for comparison at time of study interpretation.  FINDINGS: Endotracheal tube tip projects 4.2 cm  above the carina. RIGHT internal jugular central venous catheter distal tip projects in mid superior vena cava. Nasogastric tube past the mid stomach, distal tip not imaged. The cardiac silhouette appears mildly enlarged. Mediastinal silhouette is nonsuspicious. Bronchitic changes, interstitial prominence in the lung bases with patchy bibasilar airspace opacities and small pleural effusions. No pneumothorax. Old RIGHT rib fractures. Possible acute LEFT seventh versus eighth rib fracture. Old LEFT third rib fracture.  IMPRESSION: Endotracheal tube tip projects 4.2 cm above the carina. RIGHT internal jugular central venous catheter distal tip projects in mid superior vena cava. Nasogastric tube past the mid stomach, distal tip not imaged. No pneumothorax.  Interstitial prominence confluent in the lung bases suggest pulmonary edema, with small pleural effusions.  Possibly acute LEFT lateral seventh versus eighth rib fracture.   Electronically Signed   By: Elon Alas M.D.   On: 12/12/2014 04:57   Dg Swallowing Func-speech Pathology  12/17/2014    Objective Swallowing Evaluation:    Patient Details  Name: Orris Perin MRN: [id number] Date of Birth: 1950/07/15  Today's Date: 12/17/2014 Time: SLP Start Time (ACUTE ONLY): 1130-SLP Stop Time (ACUTE ONLY): 1200 SLP Time Calculation (min) (ACUTE ONLY): 30 min  Past Medical History:  Past Medical History  Diagnosis Date  . GERD (gastroesophageal reflux disease) 08/02/2012  . Dysphagia, unspecified 08/02/2012  . Allergic rhinitis 08/02/2012  . Vertebral compression fracture 08/02/2012  . Other and unspecified hyperlipidemia 08/02/2012  . Schizophrenia 08/02/2012  . Depression 08/02/2012   Past Surgical History: No past surgical history on file. HPI:  Other Pertinent Information: Pt. has a PMH of schizophrenia, sepsis,  respiratory failure, dysphagia, PNA, and GERD. Pt. was admitted when he  was found in an AMS and gargling when breathing; pt has aspiration pna  (per [facility]  report). Pt was intubated in the ED on 12/12/14 and extubated on  12/16/14 (10am). Pt. referred to speech for BSE, prior to admission he was  scheduled for a swallow evaluation at SNF.   No Data Recorded  Assessment / Plan / Recommendation CHL IP CLINICAL IMPRESSIONS 12/17/2014  Therapy Diagnosis Moderate oral phase dysphagia;Moderate pharyngeal phase  dysphagia  Clinical Impression Pt demonstrates a moderate oral dysphagia due to  inability/unwillngness to masticate solids textures during exam with  anterior oral holding with removal of bolus. Oropharyngeal phase with  moderate to severe impairment. There is a slight delay in swallow  initiation and slightly decreased laryngeal closure with trace penetration  of nectar thick liquids and aspiration of thin liquids with very late  sensation. Recommend pt consume nectar thick liquids and pureed solids; pt  may be able to upgrade solids texture if motivated by food. Unclear  whether oropharyngeal impairment is present at baseline or acutely  impaired by recent intubation. Will follow for tolerance and further  trials.       CHL IP TREATMENT RECOMMENDATION 12/17/2014  Treatment Recommendations Therapy as outlined in treatment plan below     CHL IP DIET RECOMMENDATION 12/17/2014  SLP Diet Recommendations Dysphagia 1 (Puree);Nectar  Liquid Administration via (None)  Medication Administration Crushed with puree  Compensations Small sips/bites;Slow rate  Postural Changes and/or Swallow Maneuvers (None)     CHL IP OTHER RECOMMENDATIONS 12/17/2014  Recommended Consults (None)  Oral Care Recommendations Oral care BID  Other Recommendations Order thickener from pharmacy     No flowsheet data found.   CHL IP FREQUENCY AND DURATION 12/17/2014  Speech Therapy Frequency (ACUTE ONLY) min 2x/week  Treatment Duration 2 weeks     Pertinent Vitals/Pain NA    SLP Swallow Goals No flowsheet data found.  No flowsheet data found.    CHL IP REASON FOR REFERRAL 12/17/2014  Reason for Referral  Objectively evaluate swallowing function     CHL IP ORAL PHASE 12/17/2014  Lips (None)  Tongue (None)  Mucous membranes (None)  Nutritional status (None)  Other (None)  Oxygen therapy (None)  Oral Phase WFL  Oral - Pudding Teaspoon (None)  Oral - Pudding Cup (None)  Oral - Honey Teaspoon (None)  Oral - Honey Cup (None)  Oral - Honey Syringe (None)  Oral - Nectar Teaspoon (None)  Oral - Nectar Cup (None)  Oral - Nectar Straw (None)  Oral - Nectar Syringe (None)  Oral - Ice Chips (None)  Oral - Thin Teaspoon (None)  Oral - Thin Cup (None)  Oral - Thin Straw (None)  Oral - Thin Syringe (None)  Oral - Puree (None)  Oral - Mechanical Soft (None)  Oral - Regular (None)  Oral - Multi-consistency (None)  Oral - Pill (None)  Oral Phase - Comment (None)      CHL IP PHARYNGEAL PHASE 12/17/2014  Pharyngeal Phase Impaired  Pharyngeal - Pudding Teaspoon (None)  Penetration/Aspiration details (pudding teaspoon) (None)  Pharyngeal - Pudding Cup (None)  Penetration/Aspiration details (pudding cup) (None)  Pharyngeal - Honey Teaspoon (None)  Penetration/Aspiration details (honey teaspoon) (None)  Pharyngeal - Honey Cup (None)  Penetration/Aspiration details (honey cup) (None)  Pharyngeal - Honey Syringe (None)  Penetration/Aspiration details (honey syringe) (None)  Pharyngeal - Nectar Teaspoon (None)  Penetration/Aspiration details (nectar teaspoon) (None)  Pharyngeal - Nectar Cup (None)  Penetration/Aspiration details (nectar cup) (None)  Pharyngeal - Nectar Straw (None)  Penetration/Aspiration details (nectar straw) (None)  Pharyngeal - Nectar Syringe (None)  Penetration/Aspiration details (nectar syringe) (None)  Pharyngeal - Ice Chips (None)  Penetration/Aspiration details (ice chips) (None)  Pharyngeal - Thin Teaspoon (None)  Penetration/Aspiration details (thin teaspoon) (None)  Pharyngeal - Thin Cup (None)  Penetration/Aspiration details (thin cup) (None)  Pharyngeal - Thin Straw (None)  Penetration/Aspiration details (thin  straw) (None)  Pharyngeal - Thin Syringe (None)  Penetration/Aspiration details (thin syringe') (None)  Pharyngeal - Puree (None)  Penetration/Aspiration details (puree) (None)  Pharyngeal - Mechanical Soft (None)  Penetration/Aspiration details (mechanical soft) (None)  Pharyngeal - Regular (None)  Penetration/Aspiration details (regular) (None)  Pharyngeal - Multi-consistency (None)  Penetration/Aspiration details (multi-consistency) (None)  Pharyngeal - Pill (None)  Penetration/Aspiration details (pill) (None)  Pharyngeal Comment (None)      No flowsheet data found.  No flowsheet data found.        Herbie Baltimore, MA CCC-SLP [phone]  Lynann Beaver 12/17/2014, 2:02 PM      CBC  Recent Labs Lab 12/18/14 0550 12/20/14 0437 12/21/14 0508 12/22/14 0530  WBC 9.7 13.4* 13.0* 12.1*  HGB 10.6* 11.2* 11.2* 10.5*  HCT 32.0* 34.5* 35.4* 33.9*  PLT 373 458* 466* 418*  MCV 89.9 92.5 94.1 95.2  MCH 29.8 30.0 29.8 29.5  MCHC 33.1 32.5 31.6 31.0  RDW 14.2 14.5 15.0 15.1  LYMPHSABS 1.9  --   --   --   MONOABS 2.0*  --   --   --   EOSABS  0.2  --   --   --   BASOSABS 0.0  --   --   --     Chemistries   Recent Labs Lab 12/18/14 0550 12/19/14 0425 12/20/14 0437 12/21/14 0508 12/22/14 0530 12/24/14 0635  NA 140 144 151* 157* 151* 148*  K 4.4 3.7 3.8 3.6 3.8 3.4*  CL 100* 105 111 117* 116* 117*  CO2 31 30 31  32 29 24  GLUCOSE 108* 119* 126* 130* 137* 110*  BUN 29* 32* 37* 30* 27* 19  CREATININE 0.92 0.83 1.10 0.99 0.92 0.69  CALCIUM 8.7* 8.8* 8.6* 8.7* 8.4* 8.3*  MG 2.2 2.5* 2.5*  --   --   --   AST  --   --   --   --  40 23  ALT  --   --   --   --  67* 40  ALKPHOS  --   --   --   --  48 51  BILITOT  --   --   --   --  0.6 0.5   ------------------------------------------------------------------------------------------------------------------ estimated creatinine clearance is 98 mL/min (by C-G formula based on Cr of  0.69). ------------------------------------------------------------------------------------------------------------------ No results for input(s): HGBA1C in the last 72 hours. ------------------------------------------------------------------------------------------------------------------ No results for input(s): CHOL, HDL, LDLCALC, TRIG, CHOLHDL, LDLDIRECT in the last 72 hours. ------------------------------------------------------------------------------------------------------------------ No results for input(s): TSH, T4TOTAL, T3FREE, THYROIDAB in the last 72 hours.  Invalid input(s): FREET3 ------------------------------------------------------------------------------------------------------------------ No results for input(s): VITAMINB12, FOLATE, FERRITIN, TIBC, IRON, RETICCTPCT in the last 72 hours.  Coagulation profile No results for input(s): INR, PROTIME in the last 168 hours.  No results for input(s): DDIMER in the last 72 hours.  Cardiac Enzymes No results for input(s): CKMB, TROPONINI, MYOGLOBIN in the last 168 hours.  Invalid input(s): CK ------------------------------------------------------------------------------------------------------------------ Invalid input(s): POCBNP     Time Spent in minutes   25 minutes   ELGERGAWY, DAWOOD M.D on 12/24/2014 at 8:50 AM  Between 7am to 7pm - Pager - [phone]  After 7pm go to [email or web address] - password [facility]  [facility]   Office  [phone]

## 2014-12-24 NOTE — Progress Notes (Signed)
I left another message for Ms. Jewel Monroe with DSS - I have not heard back. I have urged her for a decision as I would feel much more comfortable with DNR in place. However, if decline I would support 2 physicians to make DNR in emergency given my conversation with DSS and family.   Vinie Sill, NP Palliative Medicine Team Pager # [phone] (M-F 8a-5p) Team Phone # [phone] (Nights/Weekends)

## 2014-12-24 NOTE — Progress Notes (Signed)
Physical Therapy Treatment Patient Details Name: Kevin Humphrey MRN: [id number] DOB: 18-Jan-1951 Today's Date: 12/24/2014    History of Present Illness Pt is a 64 y/o male who is a ward of the state. He has a PMH of schizophrenia, prior PNA, from nursing home. He presents with AMS and was found to have severe sepsis of pulmonary origin with respiratory failure. Was extubated on 8/24. Per chart review, mental status is minimal at baseline. Pt is usually able to state name and put together simple sentences.     PT Comments    Pt not making progress towards goals and presents with decreased responsiveness.  Not appropriate for continued therapy at this time, but continue to recommend SNF level of care at time of D/C.  Note Palliative care consult, therefore PT to sign off at this time.  Please re-order if/when pt more appropriate for PT.   Follow Up Recommendations  SNF;Supervision/Assistance - 24 hour     Equipment Recommendations  None recommended by PT    Recommendations for Other Services       Precautions / Restrictions Precautions Precautions: Fall Precaution Comments: uses motorized w/c at baseline, unsure of transfer status Restrictions Weight Bearing Restrictions: No    Mobility  Bed Mobility Overal bed mobility: Needs Assistance;+2 for physical assistance Bed Mobility: Supine to Sit;Sit to Supine;Rolling Rolling: +2 for physical assistance;Total assist   Supine to sit: +2 for physical assistance;Total assist Sit to supine: +2 for physical assistance;Total assist   General bed mobility comments: Pt continues to have very limited movement in BUE/LEs actively or passively.  Upon entering room seemed to be very contracted in UEs and LEs, however was able to relax BLEs into extension during session.  Unable to follow commands during session and requires total A (+2) for all aspects of bed mobility.  Also performed several reps of rolling due to incontinence and pt unable to  initiate any part of motion.   Transfers                 General transfer comment: Not assess due to safety and level of arousal  Ambulation/Gait Ambulation/Gait assistance:  (not attempted due to safety and seems non ambulatory at baseline)               Stairs            Wheelchair Mobility    Modified Rankin (Stroke Patients Only)       Balance Overall balance assessment: Needs assistance Sitting-balance support: Feet supported;No upper extremity supported Sitting balance-Leahy Scale: Zero Sitting balance - Comments: Requires total A to maintain sitting balance on EOB x 4-5 mins.  He did seem to respond to "lean forward" during single attempt, but otherwise did not note any voluntary movement.                              Cognition Arousal/Alertness: Lethargic Behavior During Therapy: Flat affect Overall Cognitive Status: Difficult to assess Area of Impairment: Following commands Orientation Level: Disoriented to;Person;Place;Time;Situation (pt unable to state)     Following Commands: Follows one step commands inconsistently       General Comments: Pt unable to follow any single step commands today despite all multi modal cues provided.  Was non-verbal during session and unable to nod yes or no.     Exercises      General Comments        Pertinent Vitals/Pain Pain Assessment: No/denies  pain (no s/s of pain during session)    Home Living                      Prior Function            PT Goals (current goals can now be found in the care plan section) Acute Rehab PT Goals Patient Stated Goal: Unable to state goals. PT Goal Formulation: Patient unable to participate in goal setting Time For Goal Achievement: 12/31/14 Potential to Achieve Goals: Poor Progress towards PT goals: Not progressing toward goals - comment (PT to sign off at this time, re-order if/when appropriate)    Frequency  Other (Comment) (signing off  on pt)    PT Plan Current plan remains appropriate    Co-evaluation             End of Session Equipment Utilized During Treatment: Oxygen Activity Tolerance: Patient limited by fatigue Patient left: in bed;with call bell/phone within reach;with bed alarm set     Time: 0936-1000 PT Time Calculation (min) (ACUTE ONLY): 24 min  Charges:  $Therapeutic Activity: 23-37 mins                    G Codes:      Denice Bors 12/24/2014, 11:22 AM

## 2014-12-24 NOTE — Progress Notes (Signed)
Pt transferred from 2c to 6e- hand off provided to unit Education officer, museum.  Per RNCM pt state guardian is discussing DNR status with family and potential residential hospice placement.  Pt facility continues to follow in case pt is able to return to the facility  Domenica Reamer, [place] Social Worker [phone]

## 2014-12-24 NOTE — Progress Notes (Signed)
Daily Progress Note   Patient Name: Kevin Humphrey       Date: 12/24/2014 DOB: 17-Apr-1951  Age: 64 y.o. MRN#: [id number] Attending Physician: Albertine Patricia, MD Primary Care Physician: [name], MD Admit Date: 12/12/2014  Reason for Consultation/Follow-up: Establishing goals of care  Subjective:     Mr. Gains is sleeping this morning. I did speak with DSS Blenda Mounts who requests that I speak with Ms. Lonie Peak ([place]) regarding DNR. I spoke with Ms. Cyndi Bender who says that she has probably not seen Mr. Zuver for ~30 yrs. She shares fond memories and says that she remembers him always being a good guy. I updated her on dysphagia, dementia, and decline. She is very understanding. We discussed DNR and she says that his father wished for DNR that unfortunately was not honored and she remembers watching him suffer. She is very supportive of DNR and would NOT want for him to go through this - even says that would be "inhumane." She says she plans to visit with him but is unsure if he would even remember her. All questions answered including prognosis that could likely be days to weeks.    Length of Stay: 12 days  Current Medications: Scheduled Meds:  . ampicillin-sulbactam (UNASYN) IV  3 g Intravenous Q8H  . benztropine  1 mg Oral BID  . enoxaparin (LOVENOX) injection  40 mg Subcutaneous Q24H  . famotidine  20 mg Oral Daily  . hydrALAZINE  10 mg Oral 3 times per day  . lactulose  20 g Oral TID  . potassium chloride  20 mEq Oral Daily  . risperiDONE  1 mg Oral BID  . sertraline  150 mg Oral Daily  . Valproic Acid  125 mg Oral BID    Continuous Infusions: . dextrose 5 % and 0.2 % NaCl with KCl 20 mEq      PRN Meds: acetaminophen, antiseptic oral rinse, food thickener, hydrALAZINE  Palliative Performance Scale: 10%     Vital Signs: BP 143/81 mmHg  Pulse 86  Temp(Src) 98.5 F (36.9 C) (Axillary)  Resp 20  Ht 5\' 11"  (1.803 m)  Wt 83.9 kg (184 lb 15.5 oz)  BMI  25.81 kg/m2  SpO2 96% SpO2: SpO2: 96 % O2 Device: O2 Device: Nasal Cannula O2 Flow Rate: O2 Flow Rate (L/min): 4 L/min  Intake/output summary:  Intake/Output Summary (Last 24 hours) at 12/24/14 0911 Last data filed at 12/24/14 0600  Gross per 24 hour  Intake 1557.91 ml  Output      0 ml  Net 1557.91 ml   LBM: Last BM Date: 12/21/14 Baseline Weight: Weight: 95.709 kg (211 lb) Most recent weight: Weight: 83.9 kg (184 lb 15.5 oz)  Physical Exam: General: Sleeping - I did not attempt to awaken today   Additional Data Reviewed: Recent Labs     12/22/14  0530  12/24/14  0635  WBC  12.1*   --   HGB  10.5*   --   PLT  418*   --   NA  151*  148*  BUN  27*  19  CREATININE  0.92  0.69     Problem List:  Patient Active Problem List   Diagnosis Date Noted  . HCAP (healthcare-associated pneumonia)   . Ventilator dependent   . Acute cystitis with hematuria   . Bacteremia   . Accelerated hypertension   . Anemia, chronic disease   . Acute kidney injury   . Hypernatremia   . Protein-calorie  malnutrition   . Acute encephalopathy   . Palliative care encounter 12/17/2014  . Dysphagia 12/17/2014  . Acute respiratory failure with hypoxia 12/14/2014  . Staphylococcal pneumonia 12/14/2014  . Septic shock 12/14/2014  . AKI (acute kidney injury) 12/14/2014  . Respiratory failure   . Pneumonia 12/12/2014  . Malnutrition of moderate degree 12/12/2014  . Pressure ulcer 12/12/2014  . Convulsions/seizures 08/20/2013  . Other and unspecified hyperlipidemia 08/02/2012  . GERD (gastroesophageal reflux disease) 08/02/2012  . Schizophrenia 08/02/2012  . Depression 08/02/2012  . Allergic rhinitis 08/02/2012  . Dysphagia, unspecified(787.20) 08/02/2012  . Vertebral compression fracture 08/02/2012     Palliative Care Assessment & Plan    Code Status:  DNR  Goals of Care:  Comfort is recommended and family seems in support of this. Will need to await official decision from legal  guardian.   3. Symptom Management:  No symptoms currently.   4. Prognosis: Days to weeks  5. Discharge Planning: Recommend hospice facility   Care plan was discussed with Dr. Waldron Labs  Thank you for allowing the Palliative Medicine Team to assist in the care of this patient.   Time In: 0900 Time Out: 0930 Total Time 45min Prolonged Time Billed  no     Greater than 50%  of this time was spent counseling and coordinating care related to the above assessment and plan.   Vinie Sill, NP Palliative Medicine Team Pager # [phone] (M-F 8a-5p) Team Phone # [phone] (Nights/Weekends)  12/24/2014, 9:11 AM

## 2014-12-24 NOTE — Progress Notes (Signed)
Nutrition Follow-up  DOCUMENTATION CODES:   Non-severe (moderate) malnutrition in context of chronic illness  INTERVENTION:   When patient is more alert and responsive, provide Pudding TID with meals to maximize oral intake of calories and protein.   NUTRITION DIAGNOSIS:   Inadequate oral intake related to dysphagia as evidenced by meal completion < 50%; ongoing  GOAL:   Patient will meet greater than or equal to 90% of their needs; not met  MONITOR:   PO intake, Supplement acceptance, Labs, Weight trends  REASON FOR ASSESSMENT:   Consult Assessment of nutrition requirement/status  ASSESSMENT:   64 y/o man (ward of state) w/ hx of schizophrenia, prior PNA, p/w AMS found to have severe sepsis of pulmonary origin with respiratory failure.  Pt has been not as responsive and unable to follow commands and instructions. Per RN, unable to administer PO medications. Meal completion 0-10%. Palliative care has been following and are pursuing permission to transition to comfort care.   Diet Order:  DIET - DYS 1 Room service appropriate?: Yes; Fluid consistency:: Nectar Thick  Skin:   (+1 generalized edema)  Last BM:  8/29  Height:   Ht Readings from Last 1 Encounters:  12/12/14 _0  (1.803 m)    Weight:   Wt Readings from Last 1 Encounters:  12/24/14 195 lb 12.3 oz (88.8 kg)    Ideal Body Weight:  78.18 kg  BMI:  Body mass index is 27.32 kg/(m^2).  Estimated Nutritional Needs:   Kcal:  2000-2200  Protein:  100-115 gm  Fluid:  2-2.2 L  EDUCATION NEEDS:   No education needs identified at this time  Corrin Parker, MS, RD, LDN Pager # [phone] After hours/ weekend pager # [phone]

## 2014-12-25 LAB — CULTURE, BLOOD (ROUTINE X 2)
CULTURE: NO GROWTH
CULTURE: NO GROWTH

## 2014-12-25 LAB — GLUCOSE, CAPILLARY: GLUCOSE-CAPILLARY: 111 mg/dL — AB (ref 65–99)

## 2014-12-25 MED ORDER — LORAZEPAM 1 MG PO TABS: 1.0000 mg | ORAL_TABLET | ORAL | Status: DC | PRN

## 2014-12-25 MED ORDER — STARCH (THICKENING) PO POWD: ORAL | Status: DC

## 2014-12-25 MED ORDER — ACETAMINOPHEN 325 MG PO TABS: 650.0000 mg | ORAL_TABLET | Freq: Four times a day (QID) | ORAL | Status: DC | PRN

## 2014-12-25 MED ORDER — LORAZEPAM 2 MG/ML PO CONC: 1.0000 mg | ORAL | Status: DC | PRN

## 2014-12-25 MED ORDER — CETYLPYRIDINIUM CHLORIDE 0.05 % MT LIQD: 7.0000 mL | OROMUCOSAL | Status: DC | PRN

## 2014-12-25 MED ORDER — LORAZEPAM 2 MG/ML IJ SOLN: 1.0000 mg | Freq: Four times a day (QID) | INTRAMUSCULAR | Status: DC | PRN

## 2014-12-25 MED ORDER — MORPHINE SULFATE (CONCENTRATE) 10 MG/0.5ML PO SOLN: 5.0000 mg | ORAL | Status: DC | PRN

## 2014-12-25 NOTE — Progress Notes (Signed)
Received call back from [facility], legal guardian. DNR has been approved and she approves for [place] to find him a hospice facility placement (she says they prefer [facility] and was discussed with stepsister as well she says). Prognosis remains poor at likely < 2 weeks.   Vinie Sill, NP Palliative Medicine Team Pager # [phone] (M-F 8a-5p) Team Phone # [phone] (Nights/Weekends)

## 2014-12-25 NOTE — Care Management Important Message (Signed)
Important Message  Patient Details  Name: Kevin Humphrey MRN: [id number] Date of Birth: 05-Jul-1950   Medicare Important Message Given:  Yes-fourth notification given    RoyalRory Percy, RN 12/25/2014, 4:26 PM

## 2014-12-25 NOTE — Discharge Summary (Signed)
Kevin Humphrey, is a 64 y.o. male  DOB November 28, 1950  MRN [id number].  Admission date:  12/12/2014  Admitting Physician  Luz Brazen, MD  Discharge Date:  12/25/2014   Primary MD  [name], MD  Recommendations for primary care physician for things to follow:  - Management as per hospice facility  CODE STATUS: DO NOT RESUSCITATE, hospice care   Admission Diagnosis  Sepsis, due to unspecified organism [A41.9]   Discharge Diagnosis  Sepsis, due to unspecified organism [A41.9]    Active Problems:   Pneumonia   Malnutrition of moderate degree   Pressure ulcer   Acute respiratory failure with hypoxia   Staphylococcal pneumonia   Septic shock   AKI (acute kidney injury)   Respiratory failure   Palliative care encounter   Dysphagia   HCAP (healthcare-associated pneumonia)   Ventilator dependent   Acute cystitis with hematuria   Bacteremia   Accelerated hypertension   Anemia, chronic disease   Acute kidney injury   Hypernatremia   Protein-calorie malnutrition   Acute encephalopathy      Past Medical History  Diagnosis Date  . GERD (gastroesophageal reflux disease) 08/02/2012  . Dysphagia, unspecified 08/02/2012  . Allergic rhinitis 08/02/2012  . Vertebral compression fracture 08/02/2012  . Other and unspecified hyperlipidemia 08/02/2012  . Schizophrenia 08/02/2012  . Depression 08/02/2012    History reviewed. No pertinent past surgical history.     History of present illness and  Hospital Course:     Kindly see H&P for history of present illness and admission details, please review complete Labs, Consult reports and Test reports for all details in brief  HPI  from the history and physical done on the day of admission by PCCM on 12/12/2014 Mr. Kevin Humphrey is a 64 y/o man with a history of schizophrenia and possible seizure disorder, prior respiratory infection, who was brought to the ED  after the staff in the nursing home found him in an altered state of consciousness. He was found to have "gargling" in his breathing, with decreased responsiveness in the night of admission, and was EMS was called. The patient was found to be hypoxic, and was intubated after arrival in the ED. He was also febrile and hypotensive, so he was cultured, a central was placed, and he was given IV antibiotics. PCCM was called for admission. A call to his listed on-call contact for guardian did not yield any return calls.   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 - unlikely his mental status will improve further at this time - ongoing aggressive care is no longer appropriate given his perceived very low quality of life, and progressive terminal illness (dementia) - PC was following to pursue permission from state to transition to comfort focused care, patient was made DO NOT RESUSCITATE after palliative care discussed with patient legal guardian, as well with discussion  with available family members   Acute respiratory failure with hypoxia / VDRF - HCAP v/s aspiration PNA  - respiratory culture positive for MSSA  - intubated from 8/20 > 8/24 - continue to wean oxygen support as able  - pt unable to cooperate with pulm toiletry due to significant dementia   Septic shock secondary to HCAP +/- UTI  - pt has MSSA positive sputum Cx  - on Unasyn per PCCM to complete therapy for 10 days (stop date 9/2) - weaned off pressors 8/21  Aerococcus UTI - Treated  Diptheroids in 2 of 2 blood cx 8/20 - ?contaminant - not usually a pathogenic organism, but cultures reportedly from 2 different sources - f/u blood cx with no growth to date   Acute kidney injury - pre renal in etiology secondary to the above -  resolved  Hypernatremia - Improved with IV fluids  Moderate protein calorie malnutrition  - appreciate nutritionist assistance - dys 1 diet if pt able to tolerate - alternate means of nutrition (PEG / NG) would not be appropriate given his severe/terminal dementia   Accelerated HTN - Blood pressure has stabilized   Anemia of critical illness - Hg remains stable - no sign of active bleeding  Functional quadriplegia   Hypokalemia   Procedures  8/20 - Intubated in ED 8/21 - Weaned off pressors overnight. Sedated on vent, awakens but does not follow commands  8/22 - Still on vent. Off pressors. Psych meds restarted. On fent gtt -> RASS -2, moves all 4s. Failed SBT x 2. MRSA PCR + 8/23 - Off pressors. Did do SBT for 2-3h but now failing. Hypoactive delirium +. Rt IJ site bruise. Cx resp with MSSA, blood GPR 8/24 - different microbes from different sources, narrow to Unasyn depending on blood sensit. Doing SBT 8/25 - Needed biPAP overnight. Now on 6L [place] 8/27 - [facility] assumed care   Consults  PCCM > [facility]    Discharge Condition:  < 2 weeks   Follow UP      Discharge Instructions  and  Discharge Medications     Discharge Instructions    Discharge instructions    Complete by:  As directed   - Management as per hospice facility - Agent is on dysphagia 1 diet with nectar thick if mental status allows.            Medication List    STOP taking these medications        benztropine 2 MG tablet  Commonly known as:  COGENTIN     clonazePAM 0.5 MG tablet  Commonly known as:  KLONOPIN     cloNIDine 0.1 MG tablet  Commonly known as:  CATAPRES     divalproex 125 MG capsule  Commonly known as:  DEPAKOTE SPRINKLE     ENULOSE 10 GM/15ML Soln  Generic drug:  lactulose (encephalopathy)     famotidine 20 MG tablet  Commonly known as:  PEPCID     hydrochlorothiazide 25 MG tablet  Commonly known as:  HYDRODIURIL     lactose free nutrition Liqd      multivitamin tablet     NUEDEXTA 20-10 MG Caps  Generic drug:  Dextromethorphan-Quinidine     risperiDONE 2 MG tablet  Commonly known as:  RISPERDAL     sertraline 100 MG tablet  Commonly known as:  ZOLOFT     triamcinolone 55 MCG/ACT nasal inhaler  Commonly known as:  NASACORT      TAKE these medications  acetaminophen 325 MG tablet  Commonly known as:  TYLENOL  Take 2 tablets (650 mg total) by mouth every 6 (six) hours as needed for fever or headache (Temp > 101.5 F.).     antiseptic oral rinse 0.05 % Liqd solution  Commonly known as:  CPC / CETYLPYRIDINIUM CHLORIDE 0.05%  7 mLs by Mouth Rinse route as needed for dry mouth.     food thickener Powd  Commonly known as:  THICK IT  As needed with food     LORazepam 2 MG/ML concentrated solution  Commonly known as:  ATIVAN  Take 0.5 mLs (1 mg total) by mouth every 4 (four) hours as needed for anxiety or seizure.     morphine CONCENTRATE 10 MG/0.5ML Soln concentrated solution  Take 0.25 mLs (5 mg total) by mouth every 2 (two) hours as needed for severe pain or shortness of breath.          Diet and Activity recommendation: See Discharge Instructions above    Dg Chest 2 View  12/20/2014   CLINICAL DATA:  History of schizophrenia. Dyspnea. Prior pneumonia.  EXAM: CHEST  2 VIEW  COMPARISON:  12/16/2014  FINDINGS: Extubation and removal of nasogastric tube. Right internal jugular line tip at low SVC. Remote right rib fractures. Lateral views moderately degraded secondary to positioning and artifact posteriorly. Small bilateral pleural effusion. Midline trachea. Cardiomegaly accentuated by AP portable technique. No pneumothorax. Low lung volumes. Improved patchy lower lobe predominant airspace disease, greater on the left.  IMPRESSION: Improved aeration with decreased bibasilar opacities which could represent atelectasis or infection.  Small bilateral pleural effusions are suspected ; the lateral views are degraded.    Electronically Signed   By: Abigail Miyamoto M.D.   On: 12/20/2014 12:54   Ct Head Wo Contrast  12/12/2014   CLINICAL DATA:  Nursing home patient, found unresponsive, not responding to stimuli.  EXAM: CT HEAD WITHOUT CONTRAST  TECHNIQUE: Contiguous axial images were obtained from the base of the skull through the vertex without intravenous contrast.  COMPARISON:  None.  FINDINGS: The ventricles and sulci are normal for age. No intraparenchymal hemorrhage, mass effect nor midline shift. Patchy supratentorial white matter hypodensities are within normal range for patient's age and though non-specific suggest sequelae of chronic small vessel ischemic disease. No acute large vascular territory infarcts.  No abnormal extra-axial fluid collections. Basal cisterns are patent. Mild calcific atherosclerosis of the carotid siphons and included vertebral arteries.  No skull fracture. The included ocular globes and orbital contents are non-suspicious. Soft tissue nearly completely opacifies the LEFT maxillary sinus with bony wall thickening consistent with acute on chronic sinusitis. Mild ethmoid mucosal thickening. The mastoid air cells are well aerated. Life support lines in place. The patient is edentulous.  IMPRESSION: Negative CT head.   Electronically Signed   By: Elon Alas M.D.   On: 12/12/2014 06:28   Dg Chest Port 1 View  12/16/2014   CLINICAL DATA:  Endotracheal tube position.  EXAM: PORTABLE CHEST - 1 VIEW  COMPARISON:  December 15, 2014.  FINDINGS: Stable cardiomediastinal silhouette. Endotracheal and nasogastric tubes are unchanged in position. Right internal jugular catheter line is again noted with distal tip overlying expected position of the SVC. No pneumothorax is noted. Old right rib fractures are noted. Stable bilateral basilar opacities are noted concerning for pneumonia or atelectasis.  IMPRESSION: Stable support apparatus. Stable bibasilar opacities concerning for pneumonia or atelectasis.    Electronically Signed   By: Marijo Conception, M.D.  On: 12/16/2014 07:14   Dg Chest Port 1 View  12/15/2014   CLINICAL DATA:  Intubation.  EXAM: PORTABLE CHEST - 1 VIEW  COMPARISON:  12/14/2014.  FINDINGS: Endotracheal tube 1.7 cm above the carina. NG tube tip below left hemidiaphragm. Right IJ line with tip in cavoatrial junction. Heart size normal. Continued progression of bibasilar atelectasis and/or infiltrates. Tiny pleural effusions can't be excluded. No pneumothorax. Old right rib fractures are present.  IMPRESSION: 1. Endotracheal tube 1.7 cm above the carina. Proximal repositioning of 1 cm should be considered . NG tube and right IJ line in stable position. 2. Progressive bibasilar atelectasis and/or infiltrates. Tiny pleural effusions cannot be excluded. These results will be called to the ordering clinician or representative by the Radiologist Assistant, and communication documented in the PACS or zVision Dashboard.   Electronically Signed   By: Marcello Moores  Register   On: 12/15/2014 07:24   Dg Chest Port 1 View  12/14/2014   CLINICAL DATA:  Pneumonia.  Shortness of breath.  EXAM: PORTABLE CHEST - 1 VIEW  COMPARISON:  12/13/2014.  FINDINGS: Endotracheal tube, NG tube, right IJ line stable position. Cardiomegaly. Progressive bibasilar atelectasis and/or infiltrates are noted with bilateral small pleural effusions. No pneumothorax.  IMPRESSION: 1. Lines and tubes in stable position. 2. Progressive bibasilar atelectasis and/or infiltrates. Small pleural effusions. 3. Stable cardiomegaly.   Electronically Signed   By: Marcello Moores  Register   On: 12/14/2014 07:08   Dg Chest Port 1 View  12/13/2014   CLINICAL DATA:  Acute respiratory failure. On ventilator.  EXAM: PORTABLE CHEST - 1 VIEW  COMPARISON:  12/12/2014  FINDINGS: Support lines and tubes in appropriate position. No pneumothorax identified. Bibasilar pulmonary infiltrates show improvement since previous study. Heart size remains stable. Old rib fracture  deformities again noted.  IMPRESSION: Improving bibasilar infiltrates since prior exam.   Electronically Signed   By: Earle Gell M.D.   On: 12/13/2014 07:31   Dg Chest Portable 1 View  12/12/2014   CLINICAL DATA:  Endotracheal tube and central line placement.  EXAM: PORTABLE CHEST - 1 VIEW  COMPARISON:  None available for comparison at time of study interpretation.  FINDINGS: Endotracheal tube tip projects 4.2 cm above the carina. RIGHT internal jugular central venous catheter distal tip projects in mid superior vena cava. Nasogastric tube past the mid stomach, distal tip not imaged. The cardiac silhouette appears mildly enlarged. Mediastinal silhouette is nonsuspicious. Bronchitic changes, interstitial prominence in the lung bases with patchy bibasilar airspace opacities and small pleural effusions. No pneumothorax. Old RIGHT rib fractures. Possible acute LEFT seventh versus eighth rib fracture. Old LEFT third rib fracture.  IMPRESSION: Endotracheal tube tip projects 4.2 cm above the carina. RIGHT internal jugular central venous catheter distal tip projects in mid superior vena cava. Nasogastric tube past the mid stomach, distal tip not imaged. No pneumothorax.  Interstitial prominence confluent in the lung bases suggest pulmonary edema, with small pleural effusions.  Possibly acute LEFT lateral seventh versus eighth rib fracture.   Electronically Signed   By: Elon Alas M.D.   On: 12/12/2014 04:57   Dg Swallowing Func-speech Pathology  12/17/2014    Objective Swallowing Evaluation:    Patient Details  Name: Kevin Humphrey MRN: [id number] Date of Birth: Dec 30, 1950  Today's Date: 12/17/2014 Time: SLP Start Time (ACUTE ONLY): 1130-SLP Stop Time (ACUTE ONLY): 1200 SLP Time Calculation (min) (ACUTE ONLY): 30 min  Past Medical History:  Past Medical History  Diagnosis Date  . GERD (gastroesophageal reflux disease)  08/02/2012  . Dysphagia, unspecified 08/02/2012  . Allergic rhinitis 08/02/2012  . Vertebral  compression fracture 08/02/2012  . Other and unspecified hyperlipidemia 08/02/2012  . Schizophrenia 08/02/2012  . Depression 08/02/2012   Past Surgical History: No past surgical history on file. HPI:  Other Pertinent Information: Pt. has a PMH of schizophrenia, sepsis,  respiratory failure, dysphagia, PNA, and GERD. Pt. was admitted when he  was found in an AMS and gargling when breathing; pt has aspiration pna  (per [facility] report). Pt was intubated in the ED on 12/12/14 and extubated on  12/16/14 (10am). Pt. referred to speech for BSE, prior to admission he was  scheduled for a swallow evaluation at SNF.   No Data Recorded  Assessment / Plan / Recommendation CHL IP CLINICAL IMPRESSIONS 12/17/2014  Therapy Diagnosis Moderate oral phase dysphagia;Moderate pharyngeal phase  dysphagia  Clinical Impression Pt demonstrates a moderate oral dysphagia due to  inability/unwillngness to masticate solids textures during exam with  anterior oral holding with removal of bolus. Oropharyngeal phase with  moderate to severe impairment. There is a slight delay in swallow  initiation and slightly decreased laryngeal closure with trace penetration  of nectar thick liquids and aspiration of thin liquids with very late  sensation. Recommend pt consume nectar thick liquids and pureed solids; pt  may be able to upgrade solids texture if motivated by food. Unclear  whether oropharyngeal impairment is present at baseline or acutely  impaired by recent intubation. Will follow for tolerance and further  trials.       CHL IP TREATMENT RECOMMENDATION 12/17/2014  Treatment Recommendations Therapy as outlined in treatment plan below     CHL IP DIET RECOMMENDATION 12/17/2014  SLP Diet Recommendations Dysphagia 1 (Puree);Nectar  Liquid Administration via (None)  Medication Administration Crushed with puree  Compensations Small sips/bites;Slow rate  Postural Changes and/or Swallow Maneuvers (None)     CHL IP OTHER RECOMMENDATIONS 12/17/2014  Recommended Consults  (None)  Oral Care Recommendations Oral care BID  Other Recommendations Order thickener from pharmacy     No flowsheet data found.   CHL IP FREQUENCY AND DURATION 12/17/2014  Speech Therapy Frequency (ACUTE ONLY) min 2x/week  Treatment Duration 2 weeks     Pertinent Vitals/Pain NA    SLP Swallow Goals No flowsheet data found.  No flowsheet data found.    CHL IP REASON FOR REFERRAL 12/17/2014  Reason for Referral Objectively evaluate swallowing function     CHL IP ORAL PHASE 12/17/2014  Lips (None)  Tongue (None)  Mucous membranes (None)  Nutritional status (None)  Other (None)  Oxygen therapy (None)  Oral Phase WFL  Oral - Pudding Teaspoon (None)  Oral - Pudding Cup (None)  Oral - Honey Teaspoon (None)  Oral - Honey Cup (None)  Oral - Honey Syringe (None)  Oral - Nectar Teaspoon (None)  Oral - Nectar Cup (None)  Oral - Nectar Straw (None)  Oral - Nectar Syringe (None)  Oral - Ice Chips (None)  Oral - Thin Teaspoon (None)  Oral - Thin Cup (None)  Oral - Thin Straw (None)  Oral - Thin Syringe (None)  Oral - Puree (None)  Oral - Mechanical Soft (None)  Oral - Regular (None)  Oral - Multi-consistency (None)  Oral - Pill (None)  Oral Phase - Comment (None)      CHL IP PHARYNGEAL PHASE 12/17/2014  Pharyngeal Phase Impaired  Pharyngeal - Pudding Teaspoon (None)  Penetration/Aspiration details (pudding teaspoon) (None)  Pharyngeal - Pudding Cup (None)  Penetration/Aspiration  details (pudding cup) (None)  Pharyngeal - Honey Teaspoon (None)  Penetration/Aspiration details (honey teaspoon) (None)  Pharyngeal - Honey Cup (None)  Penetration/Aspiration details (honey cup) (None)  Pharyngeal - Honey Syringe (None)  Penetration/Aspiration details (honey syringe) (None)  Pharyngeal - Nectar Teaspoon (None)  Penetration/Aspiration details (nectar teaspoon) (None)  Pharyngeal - Nectar Cup (None)  Penetration/Aspiration details (nectar cup) (None)  Pharyngeal - Nectar Straw (None)  Penetration/Aspiration details (nectar straw) (None)   Pharyngeal - Nectar Syringe (None)  Penetration/Aspiration details (nectar syringe) (None)  Pharyngeal - Ice Chips (None)  Penetration/Aspiration details (ice chips) (None)  Pharyngeal - Thin Teaspoon (None)  Penetration/Aspiration details (thin teaspoon) (None)  Pharyngeal - Thin Cup (None)  Penetration/Aspiration details (thin cup) (None)  Pharyngeal - Thin Straw (None)  Penetration/Aspiration details (thin straw) (None)  Pharyngeal - Thin Syringe (None)  Penetration/Aspiration details (thin syringe') (None)  Pharyngeal - Puree (None)  Penetration/Aspiration details (puree) (None)  Pharyngeal - Mechanical Soft (None)  Penetration/Aspiration details (mechanical soft) (None)  Pharyngeal - Regular (None)  Penetration/Aspiration details (regular) (None)  Pharyngeal - Multi-consistency (None)  Penetration/Aspiration details (multi-consistency) (None)  Pharyngeal - Pill (None)  Penetration/Aspiration details (pill) (None)  Pharyngeal Comment (None)      No flowsheet data found.  No flowsheet data found.        Herbie [place], [place] CCC-SLP [phone]  [place], Katherene Ponto 12/17/2014, 2:02 PM     Micro Results     Recent Results (from the past 240 hour(s))  Urine culture     Status: None   Collection Time: 12/20/14 11:58 AM  Result Value Ref Range Status   Specimen Description URINE, RANDOM  Final   Special Requests NONE  Final   Culture NO GROWTH 1 DAY  Final   Report Status 12/21/2014 FINAL  Final  Culture, blood (routine x 2)     Status: None (Preliminary result)   Collection Time: 12/20/14  2:40 PM  Result Value Ref Range Status   Specimen Description BLOOD LEFT ANTECUBITAL  Final   Special Requests BOTTLES DRAWN AEROBIC ONLY [place]  Final   Culture NO GROWTH 4 DAYS  Final   Report Status PENDING  Incomplete  Culture, blood (routine x 2)     Status: None (Preliminary result)   Collection Time: 12/20/14  2:55 PM  Result Value Ref Range Status   Specimen Description BLOOD LEFT ANTECUBITAL  Final    Special Requests BOTTLES DRAWN AEROBIC ONLY [place]  Final   Culture NO GROWTH 4 DAYS  Final   Report Status PENDING  Incomplete       Today   Subjective:   Ames Hoban is none communicative  Objective:   Blood pressure 134/79, pulse 69, temperature 98.5 F (36.9 C), temperature source Oral, resp. rate 20, height 5\' 11"  (1.803 m), weight 87.98 kg (193 lb 15.4 oz), SpO2 98 %.   Intake/Output Summary (Last 24 hours) at 12/25/14 1047 Last data filed at 12/25/14 0600  Gross per 24 hour  Intake 1156.67 ml  Output      0 ml  Net 1156.67 ml    Exam  General: No acute respiratory distress, unresponsive Lungs: Mild bibasilar crackles without wheeze - respirations are calm / appear comfortable  Cardiovascular: Regular rate and rhythm without murmur gallop or rub  Abdomen: Nontender, nondistended, soft, bowel sounds positive, no rebound, no ascites, no appreciable mass Extremities: No significant cyanosis, clubbing; 1+ edema bilateral lower extremities  Data Review   CBC w Diff: Lab Results  Component  Value Date   WBC 12.1* 12/22/2014   HGB 10.5* 12/22/2014   HCT 33.9* 12/22/2014   PLT 418* 12/22/2014   LYMPHOPCT 19 12/18/2014   MONOPCT 21* 12/18/2014   EOSPCT 2 12/18/2014   BASOPCT 0 12/18/2014    CMP: Lab Results  Component Value Date   NA 148* 12/24/2014   K 3.4* 12/24/2014   CL 117* 12/24/2014   CO2 24 12/24/2014   BUN 19 12/24/2014   CREATININE 0.69 12/24/2014   PROT 6.0* 12/24/2014   ALBUMIN 2.4* 12/24/2014   BILITOT 0.5 12/24/2014   ALKPHOS 51 12/24/2014   AST 23 12/24/2014   ALT 40 12/24/2014  .   Total Time in preparing paper work, data evaluation and todays exam - 35 minutes  Gwendoline Judy M.D on 12/25/2014 at 10:47 AM  [facility]   Office  [phone]

## 2014-12-25 NOTE — Clinical Social Work Note (Signed)
Patient will discharge to [facility], 9/3. Admission paperwork completed by [place] Worker, Merdis Delay.  Patient will be transported by ambulance.   Tymere Depuy Givens, MSW, LCSW Licensed Clinical Social Worker [place] [phone]

## 2014-12-25 NOTE — Discharge Instructions (Signed)
Management per hospice facility - Patient is on dysphagia 1 with nectar thick if mental status allows.

## 2014-12-26 NOTE — Discharge Summary (Addendum)
Kevin Humphrey, is a 64 y.o. male  DOB [birth year]  MRN [id number].  Admission date:  12/12/2014  Admitting Physician  Luz Brazen, MD  Discharge Date:  12/26/2014   Primary MD  [name], MD  No significant updates since discharge summary dictated yesterday, currently patient is DO NOT RESUSCITATE, paper work has been done for discharge to [facility]. Recommendations for primary care physician for things to follow:  - Management as per hospice facility  CODE STATUS: DO NOT RESUSCITATE, hospice care   Admission Diagnosis  Sepsis, due to unspecified organism [A41.9]   Discharge Diagnosis  Sepsis, due to unspecified organism [A41.9]    Active Problems:   Pneumonia   Malnutrition of moderate degree   Pressure ulcer   Acute respiratory failure with hypoxia   Staphylococcal pneumonia   Septic shock   AKI (acute kidney injury)   Respiratory failure   Palliative care encounter   Dysphagia   HCAP (healthcare-associated pneumonia)   Ventilator dependent   Acute cystitis with hematuria   Bacteremia   Accelerated hypertension   Anemia, chronic disease   Acute kidney injury   Hypernatremia   Protein-calorie malnutrition   Acute encephalopathy      Past Medical History  Diagnosis Date  . GERD (gastroesophageal reflux disease) 08/02/2012  . Dysphagia, unspecified 08/02/2012  . Allergic rhinitis 08/02/2012  . Vertebral compression fracture 08/02/2012  . Other and unspecified hyperlipidemia 08/02/2012  . Schizophrenia 08/02/2012  . Depression 08/02/2012    History reviewed. No pertinent past surgical history.     History of present illness and  Hospital Course:     Kindly see H&P for history of present illness and admission details, please review complete Labs, Consult reports and Test reports for all details in brief  HPI  from the history and physical done on the day of admission  by PCCM on 12/12/2014 Mr. Kevin Humphrey is a 64 y/o man with a history of schizophrenia and possible seizure disorder, prior respiratory infection, who was brought to the ED after the staff in the nursing home found him in an altered state of consciousness. He was found to have "gargling" in his breathing, with decreased responsiveness in the night of admission, and was EMS was called. The patient was found to be hypoxic, and was intubated after arrival in the ED. He was also febrile and hypotensive, so he was cultured, a central was placed, and he was given IV antibiotics. PCCM was called for admission. A call to his listed on-call contact for guardian did not yield any return calls.   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 - unlikely his mental status will improve further at this time - ongoing aggressive care is no longer appropriate given his perceived very low quality of life, and progressive terminal illness (dementia) - PC was following to pursue permission from state  to transition to comfort focused care, patient was made DO NOT RESUSCITATE after palliative care discussed with patient legal guardian, as well with discussion with available family members   Acute respiratory failure with hypoxia / VDRF - HCAP v/s aspiration PNA  - respiratory culture positive for MSSA  - intubated from 8/20 > 8/24 - continue to wean oxygen support as able  - pt unable to cooperate with pulm toiletry due to significant dementia   Septic shock secondary to HCAP +/- UTI  - pt has MSSA positive sputum Cx  - on Unasyn per PCCM to complete therapy for 10 days (stop date 9/2) - weaned off pressors 8/21  Aerococcus UTI - Treated  Diptheroids in 2 of 2 blood cx 8/20 - ?contaminant - not usually a pathogenic organism, but  cultures reportedly from 2 different sources - f/u blood cx with no growth to date   Acute kidney injury - pre renal in etiology secondary to the above - resolved  Hypernatremia - Improved with IV fluids  Moderate protein calorie malnutrition  - appreciate nutritionist assistance - dys 1 diet if pt able to tolerate - alternate means of nutrition (PEG / NG) would not be appropriate given his severe/terminal dementia   Accelerated HTN - Blood pressure has stabilized   Anemia of critical illness - Hg remains stable - no sign of active bleeding  Functional quadriplegia   Hypokalemia   Procedures  8/20 - Intubated in ED 8/21 - Weaned off pressors overnight. Sedated on vent, awakens but does not follow commands  8/22 - Still on vent. Off pressors. Psych meds restarted. On fent gtt -> RASS -2, moves all 4s. Failed SBT x 2. MRSA PCR + 8/23 - Off pressors. Did do SBT for 2-3h but now failing. Hypoactive delirium +. Rt IJ site bruise. Cx resp with MSSA, blood GPR 8/24 - different microbes from different sources, narrow to Unasyn depending on blood sensit. Doing SBT 8/25 - Needed biPAP overnight. Now on 6L [place] 8/27 - [facility] assumed care   Consults  PCCM > [facility] Palliative Care    Discharge Condition:  < 2 weeks   Follow UP      Discharge Instructions  and  Discharge Medications         Discharge Instructions    Discharge instructions    Complete by:  As directed   - Management as per hospice facility - Agent is on dysphagia 1 diet with nectar thick if mental status allows.            Medication List    STOP taking these medications        benztropine 2 MG tablet  Commonly known as:  COGENTIN     clonazePAM 0.5 MG tablet  Commonly known as:  KLONOPIN     cloNIDine 0.1 MG tablet  Commonly known as:  CATAPRES     divalproex 125 MG capsule  Commonly known as:  DEPAKOTE SPRINKLE     ENULOSE 10 GM/15ML Soln  Generic drug:  lactulose (encephalopathy)       famotidine 20 MG tablet  Commonly known as:  PEPCID     hydrochlorothiazide 25 MG tablet  Commonly known as:  HYDRODIURIL     lactose free nutrition Liqd     multivitamin tablet     NUEDEXTA 20-10 MG Caps  Generic drug:  Dextromethorphan-Quinidine     risperiDONE 2 MG tablet  Commonly known as:  RISPERDAL     sertraline 100 MG tablet  Commonly known as:  ZOLOFT     triamcinolone 55 MCG/ACT nasal inhaler  Commonly known as:  NASACORT      TAKE these medications        acetaminophen 325 MG tablet  Commonly known as:  TYLENOL  Take 2 tablets (650 mg total) by mouth every 6 (six) hours as needed for fever or headache (Temp > 101.5 F.).     antiseptic oral rinse 0.05 % Liqd solution  Commonly known as:  CPC / CETYLPYRIDINIUM CHLORIDE 0.05%  7 mLs by Mouth Rinse route as needed for dry mouth.     food thickener Powd  Commonly known as:  THICK IT  As needed with food     LORazepam 2 MG/ML concentrated solution  Commonly known as:  ATIVAN  Take 0.5 mLs (1 mg total) by mouth every 4 (four) hours as needed for anxiety or seizure.     morphine CONCENTRATE 10 MG/0.5ML Soln concentrated solution  Take 0.25 mLs (5 mg total) by mouth every 2 (two) hours as needed for severe pain or shortness of breath.          Diet and Activity recommendation: See Discharge Instructions above    Dg Chest 2 View  12/20/2014   CLINICAL DATA:  History of schizophrenia. Dyspnea. Prior pneumonia.  EXAM: CHEST  2 VIEW  COMPARISON:  12/16/2014  FINDINGS: Extubation and removal of nasogastric tube. Right internal jugular line tip at low SVC. Remote right rib fractures. Lateral views moderately degraded secondary to positioning and artifact posteriorly. Small bilateral pleural effusion. Midline trachea. Cardiomegaly accentuated by AP portable technique. No pneumothorax. Low lung volumes. Improved patchy lower lobe predominant airspace disease, greater on the left.  IMPRESSION: Improved aeration with  decreased bibasilar opacities which could represent atelectasis or infection.  Small bilateral pleural effusions are suspected ; the lateral views are degraded.   Electronically Signed   By: Abigail Miyamoto M.D.   On: 12/20/2014 12:54   Ct Head Wo Contrast  12/12/2014   CLINICAL DATA:  Nursing home patient, found unresponsive, not responding to stimuli.  EXAM: CT HEAD WITHOUT CONTRAST  TECHNIQUE: Contiguous axial images were obtained from the base of the skull through the vertex without intravenous contrast.  COMPARISON:  None.  FINDINGS: The ventricles and sulci are normal for age. No intraparenchymal hemorrhage, mass effect nor midline shift. Patchy supratentorial white matter hypodensities are within normal range for patient's age and though non-specific suggest sequelae of chronic small vessel ischemic disease. No acute large vascular territory infarcts.  No abnormal extra-axial fluid collections. Basal cisterns are patent. Mild calcific atherosclerosis of the carotid siphons and included vertebral arteries.  No skull fracture. The included ocular globes and orbital contents are non-suspicious. Soft tissue nearly completely opacifies the LEFT maxillary sinus with bony wall thickening consistent with acute on chronic sinusitis. Mild ethmoid mucosal thickening. The mastoid air cells are well aerated. Life support lines in place. The patient is edentulous.  IMPRESSION: Negative CT head.   Electronically Signed   By: Elon Alas M.D.   On: 12/12/2014 06:28   Dg Chest Port 1 View  12/16/2014   CLINICAL DATA:  Endotracheal tube position.  EXAM: PORTABLE CHEST - 1 VIEW  COMPARISON:  December 15, 2014.  FINDINGS: Stable cardiomediastinal silhouette. Endotracheal and nasogastric tubes are unchanged in position. Right internal jugular catheter line is again noted with distal tip overlying expected position of the SVC. No pneumothorax is noted. Old right rib fractures are noted. Stable bilateral  basilar opacities  are noted concerning for pneumonia or atelectasis.  IMPRESSION: Stable support apparatus. Stable bibasilar opacities concerning for pneumonia or atelectasis.   Electronically Signed   By: Marijo Conception, M.D.   On: 12/16/2014 07:14   Dg Chest Port 1 View  12/15/2014   CLINICAL DATA:  Intubation.  EXAM: PORTABLE CHEST - 1 VIEW  COMPARISON:  12/14/2014.  FINDINGS: Endotracheal tube 1.7 cm above the carina. NG tube tip below left hemidiaphragm. Right IJ line with tip in cavoatrial junction. Heart size normal. Continued progression of bibasilar atelectasis and/or infiltrates. Tiny pleural effusions can't be excluded. No pneumothorax. Old right rib fractures are present.  IMPRESSION: 1. Endotracheal tube 1.7 cm above the carina. Proximal repositioning of 1 cm should be considered . NG tube and right IJ line in stable position. 2. Progressive bibasilar atelectasis and/or infiltrates. Tiny pleural effusions cannot be excluded. These results will be called to the ordering clinician or representative by the Radiologist Assistant, and communication documented in the PACS or zVision Dashboard.   Electronically Signed   By: Marcello Moores  Register   On: 12/15/2014 07:24   Dg Chest Port 1 View  12/14/2014   CLINICAL DATA:  Pneumonia.  Shortness of breath.  EXAM: PORTABLE CHEST - 1 VIEW  COMPARISON:  12/13/2014.  FINDINGS: Endotracheal tube, NG tube, right IJ line stable position. Cardiomegaly. Progressive bibasilar atelectasis and/or infiltrates are noted with bilateral small pleural effusions. No pneumothorax.  IMPRESSION: 1. Lines and tubes in stable position. 2. Progressive bibasilar atelectasis and/or infiltrates. Small pleural effusions. 3. Stable cardiomegaly.   Electronically Signed   By: Marcello Moores  Register   On: 12/14/2014 07:08   Dg Chest Port 1 View  12/13/2014   CLINICAL DATA:  Acute respiratory failure. On ventilator.  EXAM: PORTABLE CHEST - 1 VIEW  COMPARISON:  12/12/2014  FINDINGS: Support lines and tubes in  appropriate position. No pneumothorax identified. Bibasilar pulmonary infiltrates show improvement since previous study. Heart size remains stable. Old rib fracture deformities again noted.  IMPRESSION: Improving bibasilar infiltrates since prior exam.   Electronically Signed   By: Earle Gell M.D.   On: 12/13/2014 07:31   Dg Chest Portable 1 View  12/12/2014   CLINICAL DATA:  Endotracheal tube and central line placement.  EXAM: PORTABLE CHEST - 1 VIEW  COMPARISON:  None available for comparison at time of study interpretation.  FINDINGS: Endotracheal tube tip projects 4.2 cm above the carina. RIGHT internal jugular central venous catheter distal tip projects in mid superior vena cava. Nasogastric tube past the mid stomach, distal tip not imaged. The cardiac silhouette appears mildly enlarged. Mediastinal silhouette is nonsuspicious. Bronchitic changes, interstitial prominence in the lung bases with patchy bibasilar airspace opacities and small pleural effusions. No pneumothorax. Old RIGHT rib fractures. Possible acute LEFT seventh versus eighth rib fracture. Old LEFT third rib fracture.  IMPRESSION: Endotracheal tube tip projects 4.2 cm above the carina. RIGHT internal jugular central venous catheter distal tip projects in mid superior vena cava. Nasogastric tube past the mid stomach, distal tip not imaged. No pneumothorax.  Interstitial prominence confluent in the lung bases suggest pulmonary edema, with small pleural effusions.  Possibly acute LEFT lateral seventh versus eighth rib fracture.   Electronically Signed   By: Elon Alas M.D.   On: 12/12/2014 04:57   Dg Swallowing Func-speech Pathology  12/17/2014    Objective Swallowing Evaluation:    Patient Details  Name: Kevin Humphrey MRN: [id number] Date of Birth: [birth year]  Today's Date: 12/17/2014  Time: SLP Start Time (ACUTE ONLY): 1130-SLP Stop Time (ACUTE ONLY): 1200 SLP Time Calculation (min) (ACUTE ONLY): 30 min  Past Medical History:  Past  Medical History  Diagnosis Date  . GERD (gastroesophageal reflux disease) 08/02/2012  . Dysphagia, unspecified 08/02/2012  . Allergic rhinitis 08/02/2012  . Vertebral compression fracture 08/02/2012  . Other and unspecified hyperlipidemia 08/02/2012  . Schizophrenia 08/02/2012  . Depression 08/02/2012   Past Surgical History: No past surgical history on file. HPI:  Other Pertinent Information: Pt. has a PMH of schizophrenia, sepsis,  respiratory failure, dysphagia, PNA, and GERD. Pt. was admitted when he  was found in an AMS and gargling when breathing; pt has aspiration pna  (per [facility] report). Pt was intubated in the ED on 12/12/14 and extubated on  12/16/14 (10am). Pt. referred to speech for BSE, prior to admission he was  scheduled for a swallow evaluation at SNF.   No Data Recorded  Assessment / Plan / Recommendation CHL IP CLINICAL IMPRESSIONS 12/17/2014  Therapy Diagnosis Moderate oral phase dysphagia;Moderate pharyngeal phase  dysphagia  Clinical Impression Pt demonstrates a moderate oral dysphagia due to  inability/unwillngness to masticate solids textures during exam with  anterior oral holding with removal of bolus. Oropharyngeal phase with  moderate to severe impairment. There is a slight delay in swallow  initiation and slightly decreased laryngeal closure with trace penetration  of nectar thick liquids and aspiration of thin liquids with very late  sensation. Recommend pt consume nectar thick liquids and pureed solids; pt  may be able to upgrade solids texture if motivated by food. Unclear  whether oropharyngeal impairment is present at baseline or acutely  impaired by recent intubation. Will follow for tolerance and further  trials.       CHL IP TREATMENT RECOMMENDATION 12/17/2014  Treatment Recommendations Therapy as outlined in treatment plan below     CHL IP DIET RECOMMENDATION 12/17/2014  SLP Diet Recommendations Dysphagia 1 (Puree);Nectar  Liquid Administration via (None)  Medication Administration Crushed  with puree  Compensations Small sips/bites;Slow rate  Postural Changes and/or Swallow Maneuvers (None)     CHL IP OTHER RECOMMENDATIONS 12/17/2014  Recommended Consults (None)  Oral Care Recommendations Oral care BID  Other Recommendations Order thickener from pharmacy     No flowsheet data found.   CHL IP FREQUENCY AND DURATION 12/17/2014  Speech Therapy Frequency (ACUTE ONLY) min 2x/week  Treatment Duration 2 weeks     Pertinent Vitals/Pain NA    SLP Swallow Goals No flowsheet data found.  No flowsheet data found.    CHL IP REASON FOR REFERRAL 12/17/2014  Reason for Referral Objectively evaluate swallowing function     CHL IP ORAL PHASE 12/17/2014  Lips (None)  Tongue (None)  Mucous membranes (None)  Nutritional status (None)  Other (None)  Oxygen therapy (None)  Oral Phase WFL  Oral - Pudding Teaspoon (None)  Oral - Pudding Cup (None)  Oral - Honey Teaspoon (None)  Oral - Honey Cup (None)  Oral - Honey Syringe (None)  Oral - Nectar Teaspoon (None)  Oral - Nectar Cup (None)  Oral - Nectar Straw (None)  Oral - Nectar Syringe (None)  Oral - Ice Chips (None)  Oral - Thin Teaspoon (None)  Oral - Thin Cup (None)  Oral - Thin Straw (None)  Oral - Thin Syringe (None)  Oral - Puree (None)  Oral - Mechanical Soft (None)  Oral - Regular (None)  Oral - Multi-consistency (None)  Oral - Pill (None)  Oral Phase -  Comment (None)      CHL IP PHARYNGEAL PHASE 12/17/2014  Pharyngeal Phase Impaired  Pharyngeal - Pudding Teaspoon (None)  Penetration/Aspiration details (pudding teaspoon) (None)  Pharyngeal - Pudding Cup (None)  Penetration/Aspiration details (pudding cup) (None)  Pharyngeal - Honey Teaspoon (None)  Penetration/Aspiration details (honey teaspoon) (None)  Pharyngeal - Honey Cup (None)  Penetration/Aspiration details (honey cup) (None)  Pharyngeal - Honey Syringe (None)  Penetration/Aspiration details (honey syringe) (None)  Pharyngeal - Nectar Teaspoon (None)  Penetration/Aspiration details (nectar teaspoon) (None)   Pharyngeal - Nectar Cup (None)  Penetration/Aspiration details (nectar cup) (None)  Pharyngeal - Nectar Straw (None)  Penetration/Aspiration details (nectar straw) (None)  Pharyngeal - Nectar Syringe (None)  Penetration/Aspiration details (nectar syringe) (None)  Pharyngeal - Ice Chips (None)  Penetration/Aspiration details (ice chips) (None)  Pharyngeal - Thin Teaspoon (None)  Penetration/Aspiration details (thin teaspoon) (None)  Pharyngeal - Thin Cup (None)  Penetration/Aspiration details (thin cup) (None)  Pharyngeal - Thin Straw (None)  Penetration/Aspiration details (thin straw) (None)  Pharyngeal - Thin Syringe (None)  Penetration/Aspiration details (thin syringe') (None)  Pharyngeal - Puree (None)  Penetration/Aspiration details (puree) (None)  Pharyngeal - Mechanical Soft (None)  Penetration/Aspiration details (mechanical soft) (None)  Pharyngeal - Regular (None)  Penetration/Aspiration details (regular) (None)  Pharyngeal - Multi-consistency (None)  Penetration/Aspiration details (multi-consistency) (None)  Pharyngeal - Pill (None)  Penetration/Aspiration details (pill) (None)  Pharyngeal Comment (None)      No flowsheet data found.  No flowsheet data found.        Herbie [place], [place] [phone]  [place], Katherene Ponto 12/17/2014, 2:02 PM     Micro Results     Recent Results (from the past 240 hour(s))  Urine culture     Status: None   Collection Time: 12/20/14 11:58 AM  Result Value Ref Range Status   Specimen Description URINE, RANDOM  Final   Special Requests NONE  Final   Culture NO GROWTH 1 DAY  Final   Report Status 12/21/2014 FINAL  Final  Culture, blood (routine x 2)     Status: None   Collection Time: 12/20/14  2:40 PM  Result Value Ref Range Status   Specimen Description BLOOD LEFT ANTECUBITAL  Final   Special Requests BOTTLES DRAWN AEROBIC ONLY [place]  Final   Culture NO GROWTH 5 DAYS  Final   Report Status 12/25/2014 FINAL  Final  Culture, blood (routine x 2)      Status: None   Collection Time: 12/20/14  2:55 PM  Result Value Ref Range Status   Specimen Description BLOOD LEFT ANTECUBITAL  Final   Special Requests BOTTLES DRAWN AEROBIC ONLY [place]  Final   Culture NO GROWTH 5 DAYS  Final   Report Status 12/25/2014 FINAL  Final       Today   Subjective:   Kevin Humphrey is none communicative  Objective:   Blood pressure 134/101, pulse 77, temperature 98 F (36.7 C), temperature source Oral, resp. rate 18, height 5\' 11"  (1.803 m), weight 83.3 kg (183 lb 10.3 oz), SpO2 100 %.   Intake/Output Summary (Last 24 hours) at 12/26/14 4401 Last data filed at 12/26/14 0600  Gross per 24 hour  Intake    330 ml  Output    102 ml  Net    228 ml    Exam  General: No acute respiratory distress, open eyes, noncommunicative Lungs: Mild bibasilar crackles without wheeze - respirations are calm / appear comfortable  Cardiovascular: Regular rate and rhythm without murmur  gallop or rub  Abdomen: Nontender, nondistended, soft, bowel sounds positive, no rebound, no ascites, no appreciable mass Extremities: No significant cyanosis, clubbing; 1+ edema bilateral lower extremities  Data Review   CBC w Diff:  Lab Results  Component Value Date   WBC 12.1* 12/22/2014   HGB 10.5* 12/22/2014   HCT 33.9* 12/22/2014   PLT 418* 12/22/2014   LYMPHOPCT 19 12/18/2014   MONOPCT 21* 12/18/2014   EOSPCT 2 12/18/2014   BASOPCT 0 12/18/2014    CMP:  Lab Results  Component Value Date   NA 148* 12/24/2014   K 3.4* 12/24/2014   CL 117* 12/24/2014   CO2 24 12/24/2014   BUN 19 12/24/2014   CREATININE 0.69 12/24/2014   PROT 6.0* 12/24/2014   ALBUMIN 2.4* 12/24/2014   BILITOT 0.5 12/24/2014   ALKPHOS 51 12/24/2014   AST 23 12/24/2014   ALT 40 12/24/2014  .   Total Time in preparing paper work, data evaluation and todays exam - No charge  [facility], [place] M.D on 12/26/2014 at 8:32 AM  [facility]   Office  [phone]

## 2014-12-26 NOTE — Consult Note (Signed)
[facility] [employer] (late entry from yesterday 12/25/14)  Received request from [place] for [place] guardian's interest in [place] room for Mr. Germano. Offered to meet with DSS at [facility] for transfer 12/25/14. DSS did not respond to CSW request until 2:30. DSS agreed to meet at 4PM to complete transfer paper work in anticipation of 12/26/14 transfer. Met with DSS guardianship intake coordinator Merdis Delay in [place] conference room to complete paper work. Spoke with DSS guardian Jewel to obtain history and identify family. Jewel agrees to [employer] transfer Saturday and plans to visit Mr. Spillers after he arrives at [facility] Saturday.  RN please call report to [phone].  Please updated discharge summary to [facility] at [phone].  Please arrange transport for as early in the day as possible.   Thank you. Erling Conte, [place]

## 2014-12-26 NOTE — Progress Notes (Signed)
Patient d/c'd today to [facility] by EMS.  Message left for patient's guardian at [facility] of d/c. She is aware and has completed d/c paperwork with the facility. Nursing notified to call report. No further CSW needs identified. Patient is not oriented to discuss d/c plan.  CSW signing off.  Lorie Phenix. Pauline Good, [place]

## 2014-12-26 NOTE — Plan of Care (Signed)
Problem: Phase III Progression Outcomes Goal: O2 sats > or equal to 93% on room air Outcome: Not Progressing Patient will be discharged with O2.  Problem: Discharge Progression Outcomes Goal: Discharge home/Hospice/SNF Outcome: Completed/Met Date Met:  12/26/14 Discharge to [facility].

## 2014-12-26 NOTE — Progress Notes (Signed)
Report called to [place] at [facility]. PIV removed per protocol. CSW to arrange for transportation to facility.  Joellen Jersey, RN.

## 2015-01-26 ENCOUNTER — Non-Acute Institutional Stay (SKILLED_NURSING_FACILITY): Payer: Medicare Other | Admitting: Internal Medicine

## 2015-01-26 DIAGNOSIS — A4102 Sepsis due to Methicillin resistant Staphylococcus aureus: Secondary | ICD-10-CM | POA: Diagnosis not present

## 2015-01-26 DIAGNOSIS — J189 Pneumonia, unspecified organism: Secondary | ICD-10-CM | POA: Diagnosis not present

## 2015-01-26 DIAGNOSIS — Y95 Nosocomial condition: Principal | ICD-10-CM

## 2015-01-26 DIAGNOSIS — E870 Hyperosmolality and hypernatremia: Secondary | ICD-10-CM

## 2015-01-26 DIAGNOSIS — N179 Acute kidney failure, unspecified: Secondary | ICD-10-CM | POA: Diagnosis not present

## 2015-01-28 ENCOUNTER — Encounter (HOSPITAL_COMMUNITY): Payer: Self-pay | Admitting: Emergency Medicine

## 2015-01-28 ENCOUNTER — Emergency Department (HOSPITAL_COMMUNITY)
Admission: EM | Admit: 2015-01-28 | Discharge: 2015-01-28 | Disposition: A | Discharge status: Home / Self Care | Attending: Emergency Medicine | Admitting: Emergency Medicine

## 2015-01-28 ENCOUNTER — Emergency Department (HOSPITAL_COMMUNITY)

## 2015-01-28 DIAGNOSIS — Z87891 Personal history of nicotine dependence: Secondary | ICD-10-CM | POA: Diagnosis not present

## 2015-01-28 DIAGNOSIS — Z8719 Personal history of other diseases of the digestive system: Secondary | ICD-10-CM | POA: Insufficient documentation

## 2015-01-28 DIAGNOSIS — F329 Major depressive disorder, single episode, unspecified: Secondary | ICD-10-CM | POA: Diagnosis not present

## 2015-01-28 DIAGNOSIS — Y998 Other external cause status: Secondary | ICD-10-CM | POA: Diagnosis not present

## 2015-01-28 DIAGNOSIS — Y92129 Unspecified place in nursing home as the place of occurrence of the external cause: Secondary | ICD-10-CM | POA: Insufficient documentation

## 2015-01-28 DIAGNOSIS — K219 Gastro-esophageal reflux disease without esophagitis: Secondary | ICD-10-CM | POA: Diagnosis not present

## 2015-01-28 DIAGNOSIS — F209 Schizophrenia, unspecified: Secondary | ICD-10-CM | POA: Insufficient documentation

## 2015-01-28 DIAGNOSIS — Z8639 Personal history of other endocrine, nutritional and metabolic disease: Secondary | ICD-10-CM | POA: Diagnosis not present

## 2015-01-28 DIAGNOSIS — Y9389 Activity, other specified: Secondary | ICD-10-CM | POA: Insufficient documentation

## 2015-01-28 DIAGNOSIS — S20229A Contusion of unspecified back wall of thorax, initial encounter: Secondary | ICD-10-CM | POA: Diagnosis not present

## 2015-01-28 DIAGNOSIS — X58XXXA Exposure to other specified factors, initial encounter: Secondary | ICD-10-CM | POA: Diagnosis not present

## 2015-01-28 DIAGNOSIS — M546 Pain in thoracic spine: Secondary | ICD-10-CM

## 2015-01-28 DIAGNOSIS — Z8709 Personal history of other diseases of the respiratory system: Secondary | ICD-10-CM | POA: Diagnosis not present

## 2015-01-28 DIAGNOSIS — S299XXA Unspecified injury of thorax, initial encounter: Secondary | ICD-10-CM | POA: Diagnosis present

## 2015-01-28 NOTE — ED Notes (Signed)
Bed: WA07 Expected date:  Expected time:  Means of arrival:  Comments: EMS/64/psych eval

## 2015-01-28 NOTE — ED Provider Notes (Signed)
CSN: [id number]     Arrival date & time 01/28/15  1752 History   First MD Initiated Contact with Patient 01/28/15 1756     Chief Complaint  Patient presents with  . Back Pain  . Medical Clearance     Level V caveat: Developmental delay  HPI Patient is brought to the emergency department from nursing facility after he was noted to be thrashing around and then threw himself out of a bed.  EMS reports the bed was nearly on the ground.  Patient was complaining of back pain on their arrival.  Was noted to be moving all 4 extremities.  Staff at the facility report they're having more difficulty controlling the patient however EMS reports that he was given Ativan at time of their arrival and the patient's been calm and cooperative since they put him on a stretcher.  Patient has no specific complaint at this time except reports my back hurts.  EMS report vital signs are normal   Past Medical History  Diagnosis Date  . GERD (gastroesophageal reflux disease) 08/02/2012  . Dysphagia, unspecified 08/02/2012  . Allergic rhinitis 08/02/2012  . Vertebral compression fracture ([place]) 08/02/2012  . Other and unspecified hyperlipidemia 08/02/2012  . Schizophrenia ([place]) 08/02/2012  . Depression 08/02/2012   History reviewed. No pertinent past surgical history. History reviewed. No pertinent family history. Social History  Substance Use Topics  . Smoking status: Former Research scientist (life sciences)  . Smokeless tobacco: None  . Alcohol Use: None    Review of Systems  Unable to perform ROS     Allergies  Review of patient's allergies indicates no known allergies.  Home Medications   Prior to Admission medications   Medication Sig Start Date End Date Taking? Authorizing Provider  acetaminophen (TYLENOL) 325 MG tablet Take 2 tablets (650 mg total) by mouth every 6 (six) hours as needed for fever or headache (Temp > 101.5 F.). 12/25/14   Albertine Patricia, MD  antiseptic oral rinse (CPC / CETYLPYRIDINIUM CHLORIDE 0.05%) 0.05  % LIQD solution 7 mLs by Mouth Rinse route as needed for dry mouth. 12/25/14   Albertine Patricia, MD  food thickener (THICK IT) POWD As needed with food 12/25/14   Albertine Patricia, MD  LORazepam (ATIVAN) 2 MG/ML concentrated solution Take 0.5 mLs (1 mg total) by mouth every 4 (four) hours as needed for anxiety or seizure. 12/25/14   Silver Huguenin Elgergawy, MD  Morphine Sulfate (MORPHINE CONCENTRATE) 10 MG/0.5ML SOLN concentrated solution Take 0.25 mLs (5 mg total) by mouth every 2 (two) hours as needed for severe pain or shortness of breath. 12/25/14   Silver Huguenin Elgergawy, MD   BP 126/91 mmHg  Pulse 90  Temp(Src) 98.1 F (36.7 C) (Oral)  Resp 15  SpO2 100% Physical Exam  Constitutional: He appears well-developed and well-nourished.  HENT:  Head: Normocephalic and atraumatic.  Eyes: EOM are normal.  Neck: Normal range of motion.  No cervical spine tenderness or step-off  Cardiovascular: Normal rate, regular rhythm, normal heart sounds and intact distal pulses.   Pulmonary/Chest: Effort normal and breath sounds normal. No respiratory distress.  Abdominal: Soft. He exhibits no distension. There is no tenderness.  Musculoskeletal: Normal range of motion.  Full range of motion bilateral hips, knees, ankles.  Full range of motion of bilateral wrists, elbows, shoulders.  Mild swelling and bruising noted around mid thoracic spine without thoracic step-off.  No lumbar tenderness or step-off.  Neurological: He is alert.  Skin: Skin is warm and dry.  Psychiatric: He has a normal mood and affect. Judgment normal.  Nursing note and vitals reviewed.   ED Course  Procedures (including critical care time) Labs Review Labs Reviewed - No data to display  Imaging Review Dg Thoracic Spine 2 View  01/28/2015   CLINICAL DATA:  Per EMS-called out for combination of back pain and psychiatric issue. Pt born with developmental delay. Pt is currently at normal baseline. At scene-patient was flailing around on the  ground, saying incomprehensible words. Fell from bed, mid back pain, redness mid back  EXAM: THORACIC SPINE 2 VIEWS  COMPARISON:  12/20/2014  FINDINGS: There is no evidence of thoracic spine fracture. Alignment is normal. Flowing osteophytes across multiple contiguous levels in the mid and lower thoracic spine. Anterior endplate spurring in the mid and lower cervical spine. Old right fourth rib fracture. Surgical clips right upper abdomen.  IMPRESSION: 1. Degenerative spurring without fracture or other acute abnormality.   Electronically Signed   By: Lucrezia Europe M.D.   On: 01/28/2015 18:35   I have personally reviewed and evaluated these images and lab results as part of my medical decision-making.   EKG Interpretation None      MDM   Final diagnoses:  None    Patient is calm and cooperative in the emergency department.  X-ray of his thoracic spine is normal.  Discharge back to the facility for ongoing management and follow-up by his primary care physician at the facility.  Patient has when necessary Ativan available to him.  No change in medications.  Vital signs are normal.    Jola Schmidt, MD 01/28/15 [phone]

## 2015-01-28 NOTE — ED Notes (Signed)
Per EMS-called out for combination of back pain and psychiatric issue. Pt born with developmental delay. Pt is currently at normal baseline. At scene-patient was flailing around on the ground, saying incomprehensible words. Staff reports patient threw himself out of bed a few times (bed was lowered to the ground). Hit his head on wall-no LOC. No bruising/lacerations noted. Small abrasion to left knee. Pt c/o generalized back pain. No deformities noted. VSS- initial BP 124 palp, last BP was 114/60  / SpO2 94% /CBG 187 mg/dl. Staff administered 1 mg Ativan PO (given around 1615). Pt calm for EMS transport. Pt from [facility].

## 2015-01-28 NOTE — ED Notes (Signed)
Report given to RN at [facility].

## 2015-01-31 NOTE — Progress Notes (Addendum)
Patient ID: Kevin Humphrey, male   DOB: 20-Jan-1951, 64 y.o.   MRN: [id number]                HISTORY & PHYSICAL  DATE:  01/26/2015        FACILITY: [facility]                 LEVEL OF CARE:   SNF   CHIEF COMPLAINT:  Readmission to the facility, post stay at [facility] from 12/12/2014 through 12/25/2014.   Subsequently was sent to [facility] under Hospice care.  Returned to the facility on 01/22/2015.         HISTORY OF PRESENT ILLNESS:  [name] is a longstanding resident of this facility.    He has a history of schizophrenia, depression, and I always thought some degree of mental retardation.    He is largely wheelchair-bound in the facility, but is able to push himself around.  Generally feeds himself.     He also has a history of  seizure disorder, although that has not been a major issue since his arrival here.    Before he went to the hospital, we had been working him up along with the [facility] staff for a degree of increasing lethargy.  The cause of this was not clear.    On the day of admission to the hospital, he was found to be septic.  He was intubated, required pressors.  A trach sputum culture grew methicillin-sensitive Staph.  He was treated with antibiotics.    He was also felt to be hypernatremic with acute renal insufficiency.  This was treated, although I note when he left the hospital that he was still hypernatremic.    His mental status probably was aggravated by his acute illness.  However, his discharge summary labeled him as having "progressive terminal illness" (dementia).   I am not exactly sure that this patient qualifies as having dementia.  I have always felt he has a baseline mental retardation since he has been here since 2013.    He was discharged to Hospice where he did not deteriorate.  He was readmitted to the facility.  He is now off all of his psychoactive medications.      PAST MEDICAL HISTORY/PROBLEM LIST:          Recurrent falls.       History of pleural effusions.     Right rib fractures.    Compression fracture of T10.     Hypernatremia.    Abnormal ammonia level, likely secondary to valproic acid.    History of gastroesophageal reflux.    History of depression.    History of schizophrenia.    Hyperlipidemia.     Allergic rhinitis.     Iron deficiency anemia.    CURRENT MEDICATIONS:  Medication list is reviewed.              Trazodone 150 q.h.s.      Benztropine 1 mg p.o. b.i.d.     Zoloft 100 p.o. q.d.     Valproic acid 125 b.i.d.       Lorazepam 1 mg p.o. q.4 h p.r.n.       Morphine sulfate 20 mg per mL, 5 mg q.4 p.r.n.       SOCIAL HISTORY:                   ADVANCED DIRECTIVES:   The patient was a Full Code before he went out.  He  comes back from [facility] with no obvious advanced directives.   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      FAMILY HISTORY:   None available.     REVIEW OF SYSTEMS:    Not really possible in this man due to cognitive issues.    PHYSICAL EXAMINATION:   GENERAL APPEARANCE:  The patient looks much the same as before he went out to the hospital.  He is conversational, an almost childish demeanor.    HEENT:   MOUTH/THROAT:   No oral lesions are seen.     CHEST/RESPIRATORY:  Clear air entry bilaterally.     CARDIOVASCULAR:   CARDIAC:  Heart sounds are normal.  He appears to be euvolemic.    No carotid bruits.      GASTROINTESTINAL:   ABDOMEN:  Slightly distended.  Bowel sounds are positive well up into his mid chest.  I suspect he has a hiatal hernia.  No masses are noted.    LIVER/SPLEEN/KIDNEY:   No liver, no spleen.     GENITOURINARY:   BLADDER:  Not distended.  There is no CVA tenderness.    CIRCULATION:   EDEMA/VARICOSITIES:  Extremities:  No edema is noted.     ASSESSMENT/PLAN:                    Pneumonia with sepsis.   Cultured MSSA.   He has been successfully treated for this.     Acute on chronic  renal failure with hypernatremia.   He actually left the hospital with hypernatremia.  However, he appears to be reasonably stable and euvolemic at the moment.  This patient actually improved on Hospice.    Gastroesophageal reflux.  I have no doubt he probably has a hiatal hernia with bowel sounds well up into his mid chest.    Schizoaffective disorder, or disorganized schizophrenia.   He actually is more stable now off of some of his medications than he was before he went out.   He is on two different SSRIs as well as valproic acid, Zoloft, and trazodone, yet he seems to be tolerating this well.   I see no reason for him to be on benztropine as he no longer is on antipsychotics.     History of hypertension with diastolic heart failure.   He did not tolerate ACE inhibitors secondary to renal insufficiency.   However, he is not currently on anything.    Hyperlipidemia.  On Lipitor.  This was well controlled before he went out.    On hydrochlorothiazide 12.5.    This man comes back with very little information.   I am going to have to speak to the facility staff.  He just spent a month on Hospice, but comes back with nothing on his record to support any advanced directives or advanced clear planning.    I do not think he needs psychiatric medications added in at this point.  He was on Risperdal 4 mg twice a day and a much larger dose of Depakote as well as Klonopin.  I do not currently do not see a need for this currently

## 2015-02-01 ENCOUNTER — Non-Acute Institutional Stay (SKILLED_NURSING_FACILITY): Payer: Medicare Other | Admitting: Internal Medicine

## 2015-02-01 DIAGNOSIS — E870 Hyperosmolality and hypernatremia: Secondary | ICD-10-CM

## 2015-02-01 DIAGNOSIS — F203 Undifferentiated schizophrenia: Secondary | ICD-10-CM | POA: Diagnosis not present

## 2015-02-01 DIAGNOSIS — N179 Acute kidney failure, unspecified: Secondary | ICD-10-CM | POA: Diagnosis not present

## 2015-02-04 NOTE — Progress Notes (Addendum)
Patient ID: Kevin Humphrey, male   DOB: 1950-09-20, 64 y.o.   MRN: [id number]                PROGRESS NOTE  DATE:  02/01/2015         FACILITY: [facility]                      LEVEL OF CARE:   SNF   Acute Visit            CHIEF COMPLAINT:  Follow up trip to the emergency room, aggressive behavior and psychosis.    HISTORY OF PRESENT ILLNESS:  Kevin Humphrey is actually a longstanding resident of this facility.    He has schizoaffective disorder and was hospitalized in late August/early September for a sepsis, felt to be secondary to MRSA pneumonia.  He was in acute renal failure and hypernatremic.  When he left the hospital to [facility], he was felt to be in a terminal status secondary to dementia.    I never really recalled this man as having dementia.  I thought he had mental retardation and severe psychiatric problems.  He has largely been here since 2013.     He came back to the facility not on his usual antipsychotics.  His dose of valproic acid had been reduced.  He remained on trazodone 150 at h.s. and Zoloft 100 during the day.  When I readmitted him to the facility, I stopped his benztropine as he was no longer on antipsychotic medications.    Late last week, the patient became extremely agitated, throwing himself on the floor and banging his head on the wall.  He was sent to the ER complaining of back pain.  No lab work was done.  He did have an x-ray of the thoracic spine that showed no acute abnormality.    I had ordered lab work last week, including a comprehensive metabolic panel and CBC.    I was called by Hospice last week to adjust his psychiatric medications.   I put him on Risperdal 2 mg b.i.d. as well as Haldol 5 mg q.6 hours p.r.n.   As far as I understand, he has been a lot better since then with really no agitation and no violence.    Past Medical History  Diagnosis Date  . GERD (gastroesophageal reflux disease) 08/02/2012  . Dysphagia, unspecified 08/02/2012  .  Allergic rhinitis 08/02/2012  . Vertebral compression fracture ([place]) 08/02/2012  . Other and unspecified hyperlipidemia 08/02/2012  . Schizophrenia ([place]) 08/02/2012  . Depression 08/02/2012     SOCIAL HISTORY:      ADVANCED DIRECTIVES:   There is a DNR on the chart now.  He has a DFS Education officer, museum.        CURRENT MEDICATIONS: Current Outpatient Prescriptions on File Prior to Visit  Medication Sig Dispense Refill  . acetaminophen (TYLENOL) 325 MG tablet Take 2 tablets (650 mg total) by mouth every 6 (six) hours as needed for fever or headache (Temp > 101.5 F.).    Marland Kitchen antiseptic oral rinse (CPC / CETYLPYRIDINIUM CHLORIDE 0.05%) 0.05 % LIQD solution 7 mLs by Mouth Rinse route as needed for dry mouth.  0  . food thickener (THICK IT) POWD As needed with food  0  . LORazepam (ATIVAN) 2 MG/ML concentrated solution Take 0.5 mLs (1 mg total) by mouth every 4 (four) hours as needed for anxiety or seizure. 30 mL 0  . Morphine Sulfate (MORPHINE CONCENTRATE) 10  MG/0.5ML SOLN concentrated solution Take 0.25 mLs (5 mg total) by mouth every 2 (two) hours as needed for severe pain or shortness of breath. 42 mL 0    Review of systems: Not possible to assess this from the pateint     PHYSICAL EXAMINATION:   GENERAL APPEARANCE:  The patient greets me in his usual way, "Hello Doc".    CHEST/RESPIRATORY:  Clear air entry bilaterally.    CARDIOVASCULAR:   CARDIAC:  Heart sounds are normal.  There are no murmurs.   He appears to be euvolemic.     GASTROINTESTINAL:   ABDOMEN:  No masses.    LIVER/SPLEEN/KIDNEYS:  No liver, no spleen.  No tenderness.     GENITOURINARY:   BLADDER:  Not distended.  There is no CVA tenderness.    CIRCULATION:   EDEMA/VARICOSITIES:  Extremities:  No edema.    Mental Status: His usual childlike speech pattern, circumforential though pattern. Not overtly psychotic     ASSESSMENT/PLAN:                      Schizoaffective disorder.    I am assuming that his agitation and violence  had to do with this.  I have him back on 2 mg of Risperdal and Haldol p.r.n.     Acute on chronic renal failure with hypernatremia on admission to the hospital.   I would like to have lab work repeated.    Chronic renal insufficiency.   Again, I think it would be reasonable to have the BUN and creatinine repeated.  This was last checked on September 1st in the hospital with a BUN of 19 and a creatinine of 0.69.  His sodium at the time was 148.    Hospice Designation: I was not responsible for this. He does not look to be in a preterminal state. I don't know how much of this was discussed with DFS

## 2015-03-08 ENCOUNTER — Other Ambulatory Visit: Payer: Self-pay | Admitting: *Deleted

## 2015-03-08 MED ORDER — LORAZEPAM 1 MG PO TABS: ORAL_TABLET | ORAL | Status: DC

## 2015-03-08 NOTE — Telephone Encounter (Signed)
[facility] 

## 2015-04-21 ENCOUNTER — Non-Acute Institutional Stay (SKILLED_NURSING_FACILITY): Payer: Medicare Other | Admitting: Internal Medicine

## 2015-04-21 ENCOUNTER — Encounter: Payer: Self-pay | Admitting: Internal Medicine

## 2015-04-21 DIAGNOSIS — F203 Undifferentiated schizophrenia: Secondary | ICD-10-CM | POA: Diagnosis not present

## 2015-04-21 DIAGNOSIS — R634 Abnormal weight loss: Secondary | ICD-10-CM | POA: Diagnosis not present

## 2015-04-21 NOTE — Progress Notes (Addendum)
Patient ID: Kevin Humphrey, male   DOB: Aug 25, 1950, 64 y.o.   MRN: [id number]                PROGRESS NOTE  DATE:  04/20/2015        FACILITY: [facility]                 LEVEL OF CARE:   SNF   Acute Visit             CHIEF COMPLAINT:  Agitated behavior, hospice issues.      HISTORY OF PRESENT ILLNESS:  This is a patient who has schizoaffective disorder and longstanding psychosis and behavioral issues.    He was hospitalized in August and early September with sepsis, felt to be secondary to MRSA pneumonia.  He was in acute renal failure and hypernatremic.  He was discharged to [facility] where he was felt to be terminal.  He did not deteriorate and he returned to the facility on 02/01/2015.    He came back to the facility not on his usual antipsychotics.  His dose of valproic acid was reduced.  He remained on trazodone and Zoloft. I reintroduced his risperdal.  When he returned to the facility, I stopped his benzotropine as he was no longer on antipsychotics.    He apparently still is very agitated in the mornings and later in the day.  He uses Ativan fairly routinely.      PAST MEDICAL HISTORY/PROBLEM LIST: Past Medical History  Diagnosis Date  . GERD (gastroesophageal reflux disease) 08/02/2012  . Dysphagia, unspecified 08/02/2012  . Allergic rhinitis 08/02/2012  . Vertebral compression fracture ([place]) 08/02/2012  . Other and unspecified hyperlipidemia 08/02/2012  . Schizophrenia ([place]) 08/02/2012  . Depression 08/02/2012                 CURRENT MEDICATIONS:  Medication list is reviewed.        Zoloft 100 q.d.     Prilosec 20 q.d.      Valproic acid 125 b.i.d.      Risperdal 2 mg b.i.d.      Trazodone 150 at night.     Ativan 1 mg q.4 hours p.r.n.     Roxanol p.r.n.     REVIEW OF SYSTEMS:   Not possible from the patient secondary to a multitude of issues GENERAL:  Staff report that he eats poorly.    PHYSICAL EXAMINATION:   VITAL SIGNS:     TEMPERATURE:  97.6.     PULSE:  80.   RESPIRATIONS:  20.   BLOOD PRESSURE:  145/56.   WEIGHT:  169 pounds, most recently on 04/01/2015.  This is down 26 pounds since mid August.    HEENT:   MOUTH/THROAT:  Angular chelosis on the left.     CHEST/RESPIRATORY:  Clear air entry bilaterally.    CARDIOVASCULAR:   CARDIAC:  Heart sounds are normal.  He does not appear to be grossly dehydrated.      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    ASSESSMENT/PLAN:                 Agitated behavior.  When I came into the room today, he was screaming hysterically.  I see that he has been seen recently by Psychiatry and his Depakote increased to 250 b.i.d.   I think this is reasonable.   Although he did not come back to the facility on Risperdal, I restarted this in early October  due to psychosis and agitation.  That part of this seems to be under better control.   I am going to restart his Cogentin.  I do not think there is any option but to continue him on antipsychotics.  I might consider changing him to Zyprexa, Geodon, etc., at some point.    Protein calorie malnutrition per comprehensive metabolic panel I did in October, which was 2.7.  As noted above, his weights have gone down since.  He is monitoring with Hospice.  I do not think he has started to enter a dying phase.  However, this may not be far off if his weight loss continues.     CPT CODE: 13086

## 2015-04-21 NOTE — Progress Notes (Signed)
This encounter was created in error - please disregard.

## 2015-04-29 ENCOUNTER — Encounter: Payer: Self-pay | Admitting: Internal Medicine

## 2015-05-21 ENCOUNTER — Encounter: Payer: Self-pay | Admitting: Internal Medicine

## 2015-05-21 NOTE — Progress Notes (Signed)
A user error has taken place: encounter opened in error, closed for administrative reasons.

## 2015-06-07 ENCOUNTER — Encounter: Payer: Self-pay | Admitting: Internal Medicine

## 2015-06-09 ENCOUNTER — Encounter: Payer: Self-pay | Admitting: Internal Medicine

## 2015-06-09 NOTE — Progress Notes (Signed)
A user error has taken place: encounter opened in error, closed for administrative reasons.

## 2015-08-16 ENCOUNTER — Other Ambulatory Visit: Payer: Self-pay | Admitting: *Deleted

## 2015-08-16 MED ORDER — CLONAZEPAM 1 MG PO TABS: 1.0000 mg | ORAL_TABLET | Freq: Every day | ORAL | Status: DC

## 2015-08-16 NOTE — Telephone Encounter (Signed)
[facility] 

## 2015-08-25 ENCOUNTER — Encounter: Payer: Self-pay | Admitting: Nurse Practitioner

## 2015-08-25 ENCOUNTER — Non-Acute Institutional Stay (SKILLED_NURSING_FACILITY): Payer: Medicare Other | Admitting: Nurse Practitioner

## 2015-08-25 DIAGNOSIS — D638 Anemia in other chronic diseases classified elsewhere: Secondary | ICD-10-CM | POA: Diagnosis not present

## 2015-08-25 DIAGNOSIS — F329 Major depressive disorder, single episode, unspecified: Secondary | ICD-10-CM | POA: Diagnosis not present

## 2015-08-25 DIAGNOSIS — F32A Depression, unspecified: Secondary | ICD-10-CM

## 2015-08-25 DIAGNOSIS — R569 Unspecified convulsions: Secondary | ICD-10-CM

## 2015-08-25 DIAGNOSIS — K219 Gastro-esophageal reflux disease without esophagitis: Secondary | ICD-10-CM | POA: Diagnosis not present

## 2015-08-25 NOTE — Assessment & Plan Note (Signed)
02/05/15 Hgb 9.9 Update CBC

## 2015-08-25 NOTE — Progress Notes (Signed)
Patient ID: Kevin Humphrey, male   DOB: 04/07/1951, 65 y.o.   MRN: [id number]  Location:  [place] Room Number: [place] of Service:  SNF (31) Provider: Lennie Odor Jourdin Gens NP  [name], MD  Patient Care Team: Ricard Dillon, MD as PCP - General (Internal Medicine)  Extended Emergency Contact Information Primary Emergency Contact: [facility] Address: [address]          [place], [place] [address] [place] Phone: [phone] Relation: Other Secondary Emergency Contact: Nani Ravens [place] Phone: [phone] Relation: Other  Code Status:  DNR Goals of care: Advanced Directive information Advanced Directives 08/25/2015  Does patient have an advance directive? Yes  Type of Advance Directive Out of facility DNR (pink MOST or yellow form)  Does patient want to make changes to advanced directive? No - Patient declined  Copy of advanced directive(s) in chart? Yes  Would patient like information on creating an advanced directive? -     Chief Complaint  Patient presents with  . Medical Management of Chronic Issues    Routine visit    HPI:  Pt is a 65 y.o. male seen today for medical management of chronic diseases.  Hx of anemia, last Hgb 9.9 02/05/15. Mood is manage with Depakote 250mg  bid, Risperdal 3mg  bid, Klonopin 1mg  qhs, Trazodone 150mg  hs, Sertraline 100mg , last valproic acid level 52.8 08/20/15. Morphine 5mg  q2h prn available to him for comfort measures.    Past Medical History  Diagnosis Date  . GERD (gastroesophageal reflux disease) 08/02/2012  . Dysphagia, unspecified 08/02/2012  . Allergic rhinitis 08/02/2012  . Vertebral compression fracture ([place]) 08/02/2012  . Other and unspecified hyperlipidemia 08/02/2012  . Schizophrenia ([place]) 08/02/2012  . Depression 08/02/2012   No past surgical history on file.  No Known Allergies    Medication List       This list is accurate as of: 08/25/15   3:50 PM.  Always use your most recent med list.               benztropine 1 MG tablet  Commonly known as:  COGENTIN  Take 1 mg by mouth 2 (two) times daily.     clonazePAM 1 MG tablet  Commonly known as:  KLONOPIN  Take 1 tablet (1 mg total) by mouth at bedtime.     DEPAKOTE SPRINKLES PO  Take 250 mg by mouth 2 (two) times daily. To stablize mood     LORazepam 1 MG tablet  Commonly known as:  ATIVAN  Take one tablet by mouth every 4 hours as needed for anxiety     morphine CONCENTRATE 10 MG/0.5ML Soln concentrated solution  Take 0.25 mLs (5 mg total) by mouth every 2 (two) hours as needed for severe pain or shortness of breath.     multivitamin tablet  Take 1 tablet by mouth daily.     omeprazole 20 MG capsule  Commonly known as:  PRILOSEC  Take 20 mg by mouth daily.     RESOURCE 2.0 PO  Take 237 mLs by mouth 2 (two) times daily between meals.     risperiDONE 3 MG tablet  Commonly known as:  RISPERDAL  Take 3 mg by mouth 2 (two) times daily.     sertraline 100 MG tablet  Commonly known as:  ZOLOFT  Take 100 mg by mouth daily.     SYSTANE BALANCE 0.6 % Soln  Generic drug:  Propylene Glycol  Apply 1 drop to eye 2 (two) times daily. For dry eyes     traZODone 150 MG tablet  Commonly known as:  DESYREL  Take 150 mg by mouth at bedtime.     trolamine salicylate 10 % cream  Commonly known as:  ASPERCREME  Apply 1 application topically 4 (four) times daily -  before meals and at bedtime.        Review of Systems  Constitutional: Negative for fever, chills, diaphoresis, activity change, appetite change, fatigue and unexpected weight change.  HENT: Negative for congestion, dental problem, drooling, ear discharge, ear pain, facial swelling, hearing loss, mouth sores, nosebleeds, trouble swallowing and voice change.   Eyes: Negative for pain, discharge, redness and itching.  Respiratory: Negative for cough, choking, chest tightness, shortness of breath and wheezing.    Cardiovascular: Negative for chest pain, palpitations and leg swelling.  Gastrointestinal: Negative for nausea, vomiting, abdominal pain, diarrhea, constipation and abdominal distention.  Endocrine: Negative for cold intolerance and heat intolerance.  Genitourinary: Negative for dysuria, urgency, frequency and flank pain.  Musculoskeletal: Positive for arthralgias and gait problem. Negative for myalgias, back pain, joint swelling, neck pain and neck stiffness.       W/c for mobility   Skin: Negative for color change, pallor, rash and wound.  Neurological: Negative for dizziness, tremors, seizures, syncope, facial asymmetry, speech difficulty, numbness and headaches.  Hematological: Negative for adenopathy. Does not bruise/bleed easily.  Psychiatric/Behavioral: Positive for behavioral problems, confusion and decreased concentration. Negative for suicidal ideas, hallucinations, sleep disturbance, self-injury, dysphoric mood and agitation. The patient is not nervous/anxious and is not hyperactive.     Immunization History  Administered Date(s) Administered  . Influenza-Unspecified 02/24/2013, 01/29/2015  . PPD Test 01/29/2015   Pertinent  Health Maintenance Due  Topic Date Due  . COLONOSCOPY  06/17/1999  . PNA vac Low Risk Adult (1 of 2 - PCV13) 06/16/2014  . INFLUENZA VACCINE  11/23/2015   No flowsheet data found. Functional Status Survey:    Filed Vitals:   08/25/15 0950  BP: 142/78  Pulse: 84  Temp: 97.4 F (36.3 C)  TempSrc: Oral  Resp: 18  Height: 5\' 11"  (1.803 m)  Weight: 182 lb (82.555 kg)   Body mass index is 25.4 kg/(m^2). Physical Exam  Constitutional: He appears well-developed and well-nourished. No distress.  HENT:  Head: Normocephalic and atraumatic.  Eyes: Conjunctivae and EOM are normal. Pupils are equal, round, and reactive to light. Right eye exhibits no discharge. Left eye exhibits no discharge. No scleral icterus.  Neck: Normal range of motion. Neck  supple. No tracheal deviation present. No thyromegaly present.  Cardiovascular: Normal rate, regular rhythm and normal heart sounds.   No murmur heard. Pulmonary/Chest: Effort normal and breath sounds normal. No respiratory distress. He has no wheezes. He has no rales.  Abdominal: Soft. Bowel sounds are normal. He exhibits no distension. There is no tenderness. There is no rebound and no guarding.  Musculoskeletal: Normal range of motion. He exhibits no edema or tenderness.  Neurological: He is alert. He displays normal reflexes. No cranial nerve deficit. He exhibits normal muscle tone. Coordination normal.  Skin: Skin is warm and dry. No rash noted. He is not diaphoretic. No erythema.  Psychiatric: He has a normal mood and affect. His behavior is normal.    Labs reviewed:  Recent Labs  12/18/14 0550 12/19/14 0425 12/20/14 0437 12/21/14 0508 12/22/14 0530 12/24/14 0635  NA 140 144 151* 157* 151* 148*  K 4.4 3.7 3.8  3.6 3.8 3.4*  CL 100* 105 111 117* 116* 117*  CO2 31 30 31  32 29 24  GLUCOSE 108* 119* 126* 130* 137* 110*  BUN 29* 32* 37* 30* 27* 19  CREATININE 0.92 0.83 1.10 0.99 0.92 0.69  CALCIUM 8.7* 8.8* 8.6* 8.7* 8.4* 8.3*  MG 2.2 2.5* 2.5*  --   --   --   PHOS 3.9 3.7 4.1  --   --   --     Recent Labs  12/14/14 0446 12/22/14 0530 12/24/14 0635  AST 28 40 23  ALT 42 67* 40  ALKPHOS 61 48 51  BILITOT 0.5 0.6 0.5  PROT 6.0* 6.5 6.0*  ALBUMIN 2.3* 2.4* 2.4*    Recent Labs  12/16/14 0500 12/17/14 0521 12/18/14 0550 12/20/14 0437 12/21/14 0508 12/22/14 0530  WBC 6.9 7.8 9.7 13.4* 13.0* 12.1*  NEUTROABS 3.5 3.9 5.6  --   --   --   HGB 9.6* 10.6* 10.6* 11.2* 11.2* 10.5*  HCT 28.8* 31.8* 32.0* 34.5* 35.4* 33.9*  MCV 88.6 90.6 89.9 92.5 94.1 95.2  PLT 214 281 373 458* 466* 418*   No results found for: TSH No results found for: HGBA1C No results found for: CHOL, HDL, LDLCALC, LDLDIRECT, TRIG, CHOLHDL  Significant Diagnostic Results in last 30 days:  No  results found.  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 Update CMP, TSH, Hgb a1c  GERD (gastroesophageal reflux disease) Stable, continue Omeprazole 20mg  daily.     Family/ staff Communication: continue SNF for care needs.   Labs/tests ordered: CBC, CMP, TSH, Hgb A1c

## 2015-08-25 NOTE — Assessment & Plan Note (Signed)
Seizure free since last visited, continue Depakote 250mg  bid, valproic acid level 52.8 08/20/15

## 2015-08-25 NOTE — Assessment & Plan Note (Addendum)
Mood is managed with Sertraline 100mg , Risperdal 3mg  bid, Trazodone 150mg  hs, Klonopin 1mg  qh. Update CMP, TSH, Hgb a1c

## 2015-08-25 NOTE — Assessment & Plan Note (Signed)
Stable, continue Omeprazole 20mg daily.  

## 2015-08-27 LAB — CBC AND DIFFERENTIAL
HEMATOCRIT: 36 % — AB (ref 41–53)
Hemoglobin: 11.8 g/dL — AB (ref 13.5–17.5)
Platelets: 305 10*3/uL (ref 150–399)
WBC: 7.4 10^3/mL

## 2015-08-27 LAB — BASIC METABOLIC PANEL
BUN: 16 mg/dL (ref 4–21)
Creatinine: 0.9 mg/dL (ref 0.6–1.3)
GLUCOSE: 86 mg/dL
POTASSIUM: 4.6 mmol/L (ref 3.4–5.3)
SODIUM: 137 mmol/L (ref 137–147)

## 2015-08-27 LAB — HEPATIC FUNCTION PANEL
ALK PHOS: 59 U/L (ref 25–125)
ALT: 7 U/L — AB (ref 10–40)
AST: 13 U/L — AB (ref 14–40)
BILIRUBIN, TOTAL: 0.3 mg/dL

## 2015-08-27 LAB — TSH: TSH: 1.38 u[IU]/mL (ref 0.41–5.90)

## 2015-08-27 LAB — HEMOGLOBIN A1C: Hemoglobin A1C: 5

## 2015-09-08 ENCOUNTER — Non-Acute Institutional Stay (SKILLED_NURSING_FACILITY): Payer: Medicare Other | Admitting: Nurse Practitioner

## 2015-09-08 ENCOUNTER — Encounter: Payer: Self-pay | Admitting: Nurse Practitioner

## 2015-09-08 DIAGNOSIS — R569 Unspecified convulsions: Secondary | ICD-10-CM

## 2015-09-08 DIAGNOSIS — F203 Undifferentiated schizophrenia: Secondary | ICD-10-CM

## 2015-09-08 DIAGNOSIS — K219 Gastro-esophageal reflux disease without esophagitis: Secondary | ICD-10-CM | POA: Diagnosis not present

## 2015-09-08 DIAGNOSIS — D638 Anemia in other chronic diseases classified elsewhere: Secondary | ICD-10-CM | POA: Diagnosis not present

## 2015-09-08 DIAGNOSIS — F32A Depression, unspecified: Secondary | ICD-10-CM

## 2015-09-08 DIAGNOSIS — F329 Major depressive disorder, single episode, unspecified: Secondary | ICD-10-CM

## 2015-09-08 NOTE — Progress Notes (Signed)
Patient ID: Kevin Humphrey, male   DOB: 08/20/1950, 65 y.o.   MRN: [id number]  Location:  [place] Room Number: [place] of Service:  SNF (31) Provider: Lennie Odor Mast NP  GREEN, Viviann Spare, MD  Patient Care Team: Estill Dooms, MD as PCP - General (Internal Medicine)  Extended Emergency Contact Information Primary Emergency Contact: [facility] Address: [address]          [place], [place] [address] [place] Phone: [phone] Relation: Other Secondary Emergency Contact: Nani Ravens [place] Phone: [phone] Relation: Other  Code Status:  DNR Goals of care: Advanced Directive information Advanced Directives 09/08/2015  Does patient have an advance directive? Yes  Type of Advance Directive Out of facility DNR (pink MOST or yellow form)  Does patient want to make changes to advanced directive? No - Patient declined  Copy of advanced directive(s) in chart? Yes     Chief Complaint  Patient presents with  . Blood Pressure Check  . Labs Only    HPI:  Pt is a 65 y.o. male seen today for medical management of chronic diseases.  Hx of anemia, last Hgb 9.9 02/05/15, 11.8 08/27/15. Mood is manage with Depakote 250mg  bid, Risperdal 3mg  bid, Klonopin 1mg  qhs, Trazodone 150mg  hs, Sertraline 100mg , last valproic acid level 52.8 08/20/15. Morphine 5mg  q2h prn available to him for comfort measures.    Past Medical History  Diagnosis Date  . GERD (gastroesophageal reflux disease) 08/02/2012  . Dysphagia, unspecified 08/02/2012  . Allergic rhinitis 08/02/2012  . Vertebral compression fracture ([place]) 08/02/2012  . Other and unspecified hyperlipidemia 08/02/2012  . Schizophrenia ([place]) 08/02/2012  . Depression 08/02/2012   No past surgical history on file.  No Known Allergies    Medication List       This list is accurate as of: 09/08/15 12:46 PM.  Always use your most recent med list.               acetaminophen  325 MG tablet  Commonly known as:  TYLENOL  Take 650 mg by mouth every 4 (four) hours as needed for mild pain, moderate pain, fever or headache.     benztropine 1 MG tablet  Commonly known as:  COGENTIN  Take 1 mg by mouth 2 (two) times daily.     clonazePAM 1 MG tablet  Commonly known as:  KLONOPIN  Take 1 tablet (1 mg total) by mouth at bedtime.     divalproex 125 MG DR tablet  Commonly known as:  DEPAKOTE  Take 375 mg by mouth 2 (two) times daily. To stabilize mood     LORazepam 1 MG tablet  Commonly known as:  ATIVAN  Take one tablet by mouth every 4 hours as needed for anxiety     morphine CONCENTRATE 10 MG/0.5ML Soln concentrated solution  Take 0.25 mLs (5 mg total) by mouth every 2 (two) hours as needed for severe pain or shortness of breath.     multivitamin tablet  Take 1 tablet by mouth daily.     omeprazole 20 MG capsule  Commonly known as:  PRILOSEC  Take 20 mg by mouth daily.     risperiDONE 3 MG tablet  Commonly known as:  RISPERDAL  Take 3 mg by mouth 2 (two) times daily.     sertraline 100 MG tablet  Commonly known as:  ZOLOFT  Take 100 mg by mouth daily.     SYSTANE BALANCE  0.6 % Soln  Generic drug:  Propylene Glycol  Apply 1 drop to eye 2 (two) times daily. For dry eyes     traZODone 150 MG tablet  Commonly known as:  DESYREL  Take 150 mg by mouth at bedtime.     trolamine salicylate 10 % cream  Commonly known as:  ASPERCREME  Apply 1 application topically 4 (four) times daily -  before meals and at bedtime.        Review of Systems  Constitutional: Negative for fever, chills, diaphoresis, activity change, appetite change, fatigue and unexpected weight change.  HENT: Negative for congestion, dental problem, drooling, ear discharge, ear pain, facial swelling, hearing loss, mouth sores, nosebleeds, trouble swallowing and voice change.   Eyes: Negative for pain, discharge, redness and itching.  Respiratory: Negative for cough, choking, chest  tightness, shortness of breath and wheezing.   Cardiovascular: Negative for chest pain, palpitations and leg swelling.  Gastrointestinal: Negative for nausea, vomiting, abdominal pain, diarrhea, constipation and abdominal distention.  Endocrine: Negative for cold intolerance and heat intolerance.  Genitourinary: Negative for dysuria, urgency, frequency and flank pain.  Musculoskeletal: Positive for arthralgias and gait problem. Negative for myalgias, back pain, joint swelling, neck pain and neck stiffness.       W/c for mobility   Skin: Negative for color change, pallor, rash and wound.  Neurological: Negative for dizziness, tremors, seizures, syncope, facial asymmetry, speech difficulty, numbness and headaches.  Hematological: Negative for adenopathy. Does not bruise/bleed easily.  Psychiatric/Behavioral: Positive for behavioral problems, confusion and decreased concentration. Negative for suicidal ideas, hallucinations, sleep disturbance, self-injury, dysphoric mood and agitation. The patient is not nervous/anxious and is not hyperactive.     Immunization History  Administered Date(s) Administered  . Influenza-Unspecified 02/24/2013, 01/29/2015  . PPD Test 01/29/2015   Pertinent  Health Maintenance Due  Topic Date Due  . COLONOSCOPY  06/17/1999  . PNA vac Low Risk Adult (1 of 2 - PCV13) 06/16/2014  . INFLUENZA VACCINE  11/23/2015   No flowsheet data found. Functional Status Survey:    Filed Vitals:   09/08/15 1031  BP: 125/76  Pulse: 76  Temp: 98 F (36.7 C)  TempSrc: Oral  Resp: 19  Height: 5\' 11"  (1.803 m)  Weight: 182 lb (82.555 kg)  SpO2: 95%   Body mass index is 25.4 kg/(m^2). Physical Exam  Constitutional: He appears well-developed and well-nourished. No distress.  HENT:  Head: Normocephalic and atraumatic.  Eyes: Conjunctivae and EOM are normal. Pupils are equal, round, and reactive to light. Right eye exhibits no discharge. Left eye exhibits no discharge. No  scleral icterus.  Neck: Normal range of motion. Neck supple. No tracheal deviation present. No thyromegaly present.  Cardiovascular: Normal rate, regular rhythm and normal heart sounds.   No murmur heard. Pulmonary/Chest: Effort normal and breath sounds normal. No respiratory distress. He has no wheezes. He has no rales.  Abdominal: Soft. Bowel sounds are normal. He exhibits no distension. There is no tenderness. There is no rebound and no guarding.  Musculoskeletal: Normal range of motion. He exhibits no edema or tenderness.  Neurological: He is alert. He displays normal reflexes. No cranial nerve deficit. He exhibits normal muscle tone. Coordination normal.  Skin: Skin is warm and dry. No rash noted. He is not diaphoretic. No erythema.  Psychiatric: He has a normal mood and affect. His behavior is normal.    Labs reviewed:  Recent Labs  12/18/14 0550 12/19/14 0425 12/20/14 [id number] 12/21/14 [id number] 12/22/14 0530 12/24/14 [id number]  08/27/15  NA 140 144 151* 157* 151* 148* 137  K 4.4 3.7 3.8 3.6 3.8 3.4* 4.6  CL 100* 105 111 117* 116* 117*  --   CO2 31 30 31  32 29 24  --   GLUCOSE 108* 119* 126* 130* 137* 110*  --   BUN 29* 32* 37* 30* 27* 19 16  CREATININE 0.92 0.83 1.10 0.99 0.92 0.69 0.9  CALCIUM 8.7* 8.8* 8.6* 8.7* 8.4* 8.3*  --   MG 2.2 2.5* 2.5*  --   --   --   --   PHOS 3.9 3.7 4.1  --   --   --   --     Recent Labs  12/14/14 0446 12/22/14 0530 12/24/14 0635 08/27/15  AST 28 40 23 13*  ALT 42 67* 40 7*  ALKPHOS 61 48 51 59  BILITOT 0.5 0.6 0.5  --   PROT 6.0* 6.5 6.0*  --   ALBUMIN 2.3* 2.4* 2.4*  --     Recent Labs  12/16/14 0500 12/17/14 0521 12/18/14 0550 12/20/14 0437 12/21/14 0508 12/22/14 0530 08/27/15  WBC 6.9 7.8 9.7 13.4* 13.0* 12.1* 7.4  NEUTROABS 3.5 3.9 5.6  --   --   --   --   HGB 9.6* 10.6* 10.6* 11.2* 11.2* 10.5* 11.8*  HCT 28.8* 31.8* 32.0* 34.5* 35.4* 33.9* 36*  MCV 88.6 90.6 89.9 92.5 94.1 95.2  --   PLT 214 281 373 458* 466* 418* 305   Lab  Results  Component Value Date   TSH 1.38 08/27/2015   Lab Results  Component Value Date   HGBA1C 5.0 08/27/2015   No results found for: CHOL, HDL, LDLCALC, LDLDIRECT, TRIG, CHOLHDL  Significant Diagnostic Results in last 30 days:  No results found.  Assessment/Plan  Anemia, chronic disease 02/05/15 Hgb 9.9 08/27/15 Hgb 11.8   Convulsions/seizures ([facility]) Seizure free since last visited, continue Depakote 250mg  bid, valproic acid level 52.8 08/20/15   Depression Is emotionally stable, continue zoloft 200 mg daily, 08/27/15 TSH 1.385    GERD (gastroesophageal reflux disease) Stable, continue Omeprazole 20mg  daily.    Schizophrenia Stable, continue Risperdal 3mg  bid, Sertraline 100mg  qd, Trazodone 150mg  qhs, Clonazepam 1mg  qhs, Lorazepam 1mg  q4h prn.     Family/ staff Communication: continue SNF for care needs.   Labs/tests ordered: none

## 2015-09-08 NOTE — Assessment & Plan Note (Signed)
Stable, continue Risperdal 3mg  bid, Sertraline 100mg  qd, Trazodone 150mg  qhs, Clonazepam 1mg  qhs, Lorazepam 1mg  q4h prn.

## 2015-09-08 NOTE — Assessment & Plan Note (Addendum)
Is emotionally stable, continue zoloft 200 mg daily, 08/27/15 TSH 1.385

## 2015-09-08 NOTE — Assessment & Plan Note (Signed)
02/05/15 Hgb 9.9 08/27/15 Hgb 11.8

## 2015-09-08 NOTE — Assessment & Plan Note (Signed)
Stable, continue Omeprazole 20mg daily.  

## 2015-09-08 NOTE — Assessment & Plan Note (Signed)
Seizure free since last visited, continue Depakote 250mg  bid, valproic acid level 52.8 08/20/15

## 2015-09-22 ENCOUNTER — Encounter: Payer: Self-pay | Admitting: Nurse Practitioner

## 2015-09-22 ENCOUNTER — Non-Acute Institutional Stay (SKILLED_NURSING_FACILITY): Payer: Medicare Other | Admitting: Nurse Practitioner

## 2015-09-22 DIAGNOSIS — D638 Anemia in other chronic diseases classified elsewhere: Secondary | ICD-10-CM

## 2015-09-22 DIAGNOSIS — N179 Acute kidney failure, unspecified: Secondary | ICD-10-CM

## 2015-09-22 DIAGNOSIS — I10 Essential (primary) hypertension: Secondary | ICD-10-CM

## 2015-09-22 DIAGNOSIS — K219 Gastro-esophageal reflux disease without esophagitis: Secondary | ICD-10-CM | POA: Diagnosis not present

## 2015-09-22 DIAGNOSIS — M4850XS Collapsed vertebra, not elsewhere classified, site unspecified, sequela of fracture: Secondary | ICD-10-CM

## 2015-09-22 DIAGNOSIS — R569 Unspecified convulsions: Secondary | ICD-10-CM

## 2015-09-22 DIAGNOSIS — F209 Schizophrenia, unspecified: Secondary | ICD-10-CM

## 2015-09-22 DIAGNOSIS — F32A Depression, unspecified: Secondary | ICD-10-CM

## 2015-09-22 DIAGNOSIS — F329 Major depressive disorder, single episode, unspecified: Secondary | ICD-10-CM | POA: Diagnosis not present

## 2015-09-22 DIAGNOSIS — IMO0001 Reserved for inherently not codable concepts without codable children: Secondary | ICD-10-CM

## 2015-09-23 ENCOUNTER — Telehealth: Payer: Self-pay | Admitting: *Deleted

## 2015-09-23 NOTE — Telephone Encounter (Signed)
Spoke with [name] [facility] nurse) regarding a medication change by Marlana Latus NP. She was informed that Kevin Humphrey start taking lisinopril  5 mg daily for elevated BP, she stated  that she understood and would get the ordered right a way.

## 2015-09-23 NOTE — Assessment & Plan Note (Signed)
Seizure free since last visited, continue Depakote 250mg  bid, valproic acid level 52.8 08/20/15

## 2015-09-23 NOTE — Assessment & Plan Note (Signed)
Stable, continue Omeprazole 20mg daily.  

## 2015-09-23 NOTE — Assessment & Plan Note (Signed)
08/27/15 Bun 16, creat 0.9

## 2015-09-23 NOTE — Assessment & Plan Note (Signed)
02/05/15 Hgb 9.9 08/27/15 Hgb 11.8

## 2015-09-23 NOTE — Assessment & Plan Note (Signed)
He is presently stable is taking percocet 5/325 mg 1or 2 tabs every 6 hours as needed for pain

## 2015-09-23 NOTE — Assessment & Plan Note (Signed)
Stable, continue Risperdal 3mg  bid, Sertraline 100mg  qd, Trazodone 150mg  qhs, Clonazepam 1mg  qhs, Lorazepam 1mg  q4h prn.

## 2015-09-23 NOTE — Assessment & Plan Note (Signed)
Is emotionally stable, continue zoloft 200 mg daily, 08/27/15 TSH 1.385

## 2015-09-23 NOTE — Assessment & Plan Note (Signed)
Mildly elevated Sbp 160s, will try Lisinopril 5mg  daily.

## 2015-09-23 NOTE — Progress Notes (Signed)
Patient ID: Kevin Humphrey, male   DOB: 1950/05/22, 65 y.o.   MRN: [id number]  Location:  [facility] Room Number: [place] of Service:  SNF (31) Provider: Lennie Odor Aison Malveaux NP  Estill Dooms, MD  Patient Care Team: Estill Dooms, MD as PCP - General (Internal Medicine)  Extended Emergency Contact Information Primary Emergency Contact: [name] Address: [address]          [place], [place] [address] [place] Phone: [phone] Relation: Other Secondary Emergency Contact: Nani Ravens [place] of [place] Phone: [phone] Relation: Other  Code Status:  DNR Goals of care: Advanced Directive information Advanced Directives 09/22/2015  Does patient have an advance directive? Yes  Type of Advance Directive Out of facility DNR (pink MOST or yellow form)  Does patient want to make changes to advanced directive? No - Patient declined  Copy of advanced directive(s) in chart? Yes     Chief Complaint  Patient presents with  . Medical Management of Chronic Issues    HPI:  Pt is a 65 y.o. male seen today for medical management of chronic diseases.  Hx of anemia, last Hgb 9.9 02/05/15, 11.8 08/27/15. Mood is manage with Depakote 250mg  bid, Risperdal 3mg  bid, Klonopin 1mg  qhs, Trazodone 150mg  hs, Sertraline 100mg , last valproic acid level 52.8 08/20/15. Morphine 5mg  q2h prn available to him for comfort measures.    Past Medical History  Diagnosis Date  . GERD (gastroesophageal reflux disease) 08/02/2012  . Dysphagia, unspecified 08/02/2012  . Allergic rhinitis 08/02/2012  . Vertebral compression fracture ([place]) 08/02/2012  . Other and unspecified hyperlipidemia 08/02/2012  . Schizophrenia ([place]) 08/02/2012  . Depression 08/02/2012   History reviewed. No pertinent past surgical history.  No Known Allergies    Medication List       This list is accurate as of: 09/22/15 11:59 PM.  Always use your most recent med list.                 acetaminophen 325 MG tablet  Commonly known as:  TYLENOL  Take 650 mg by mouth every 4 (four) hours as needed for mild pain, moderate pain, fever or headache.     benztropine 1 MG tablet  Commonly known as:  COGENTIN  Take 1 mg by mouth 2 (two) times daily.     clonazePAM 1 MG tablet  Commonly known as:  KLONOPIN  Take 1 tablet (1 mg total) by mouth at bedtime.     divalproex 125 MG DR tablet  Commonly known as:  DEPAKOTE  Take 375 mg by mouth 2 (two) times daily. To stabilize mood     LORazepam 1 MG tablet  Commonly known as:  ATIVAN  Take one tablet by mouth every 4 hours as needed for anxiety     morphine CONCENTRATE 10 MG/0.5ML Soln concentrated solution  Take 0.25 mLs (5 mg total) by mouth every 2 (two) hours as needed for severe pain or shortness of breath.     multivitamin tablet  Take 1 tablet by mouth daily.     omeprazole 20 MG capsule  Commonly known as:  PRILOSEC  Take 20 mg by mouth daily.     risperiDONE 3 MG tablet  Commonly known as:  RISPERDAL  Take 3 mg by mouth 2 (two) times daily.     sertraline 100 MG tablet  Commonly known as:  ZOLOFT  Take 100 mg by mouth daily.     SYSTANE  BALANCE 0.6 % Soln  Generic drug:  Propylene Glycol  Apply 1 drop to eye 2 (two) times daily. For dry eyes     traZODone 150 MG tablet  Commonly known as:  DESYREL  Take 150 mg by mouth at bedtime.     trolamine salicylate 10 % cream  Commonly known as:  ASPERCREME  Apply 1 application topically 4 (four) times daily -  before meals and at bedtime.        Review of Systems  Constitutional: Negative for fever, chills, diaphoresis, activity change, appetite change, fatigue and unexpected weight change.  HENT: Negative for congestion, dental problem, drooling, ear discharge, ear pain, facial swelling, hearing loss, mouth sores, nosebleeds, trouble swallowing and voice change.   Eyes: Negative for pain, discharge, redness and itching.  Respiratory: Negative for  cough, choking, chest tightness, shortness of breath and wheezing.   Cardiovascular: Negative for chest pain, palpitations and leg swelling.  Gastrointestinal: Negative for nausea, vomiting, abdominal pain, diarrhea, constipation and abdominal distention.  Endocrine: Negative for cold intolerance and heat intolerance.  Genitourinary: Negative for dysuria, urgency, frequency and flank pain.  Musculoskeletal: Positive for arthralgias and gait problem. Negative for myalgias, back pain, joint swelling, neck pain and neck stiffness.       W/c for mobility   Skin: Negative for color change, pallor, rash and wound.  Neurological: Negative for dizziness, tremors, seizures, syncope, facial asymmetry, speech difficulty, numbness and headaches.  Hematological: Negative for adenopathy. Does not bruise/bleed easily.  Psychiatric/Behavioral: Positive for behavioral problems, confusion and decreased concentration. Negative for suicidal ideas, hallucinations, sleep disturbance, self-injury, dysphoric mood and agitation. The patient is not nervous/anxious and is not hyperactive.     Immunization History  Administered Date(s) Administered  . Influenza-Unspecified 02/24/2013, 01/29/2015  . PPD Test 01/29/2015   Pertinent  Health Maintenance Due  Topic Date Due  . COLONOSCOPY  06/17/1999  . PNA vac Low Risk Adult (1 of 2 - PCV13) 06/16/2014  . INFLUENZA VACCINE  11/23/2015   No flowsheet data found. Functional Status Survey:    Filed Vitals:   09/22/15 1514  BP: 160/88  Pulse: 82  Temp: 98.1 F (36.7 C)  TempSrc: Oral  Resp: 20  Height: 5\' 11"  (1.803 m)  Weight: 182 lb (82.555 kg)   Body mass index is 25.4 kg/(m^2). Physical Exam  Constitutional: He appears well-developed and well-nourished. No distress.  HENT:  Head: Normocephalic and atraumatic.  Eyes: Conjunctivae and EOM are normal. Pupils are equal, round, and reactive to light. Right eye exhibits no discharge. Left eye exhibits no  discharge. No scleral icterus.  Neck: Normal range of motion. Neck supple. No tracheal deviation present. No thyromegaly present.  Cardiovascular: Normal rate, regular rhythm and normal heart sounds.   No murmur heard. Pulmonary/Chest: Effort normal and breath sounds normal. No respiratory distress. He has no wheezes. He has no rales.  Abdominal: Soft. Bowel sounds are normal. He exhibits no distension. There is no tenderness. There is no rebound and no guarding.  Musculoskeletal: Normal range of motion. He exhibits no edema or tenderness.  Neurological: He is alert. He displays normal reflexes. No cranial nerve deficit. He exhibits normal muscle tone. Coordination normal.  Skin: Skin is warm and dry. No rash noted. He is not diaphoretic. No erythema.  Psychiatric: He has a normal mood and affect. His behavior is normal.    Labs reviewed:  Recent Labs  12/18/14 0550 12/19/14 0425 12/20/14 [id number] 12/21/14 0508 12/22/14 0530 12/24/14 [id number] 08/27/15  NA 140 144 151* 157* 151* 148* 137  K 4.4 3.7 3.8 3.6 3.8 3.4* 4.6  CL 100* 105 111 117* 116* 117*  --   CO2 31 30 31  32 29 24  --   GLUCOSE 108* 119* 126* 130* 137* 110*  --   BUN 29* 32* 37* 30* 27* 19 16  CREATININE 0.92 0.83 1.10 0.99 0.92 0.69 0.9  CALCIUM 8.7* 8.8* 8.6* 8.7* 8.4* 8.3*  --   MG 2.2 2.5* 2.5*  --   --   --   --   PHOS 3.9 3.7 4.1  --   --   --   --     Recent Labs  12/14/14 0446 12/22/14 0530 12/24/14 0635 08/27/15  AST 28 40 23 13*  ALT 42 67* 40 7*  ALKPHOS 61 48 51 59  BILITOT 0.5 0.6 0.5  --   PROT 6.0* 6.5 6.0*  --   ALBUMIN 2.3* 2.4* 2.4*  --     Recent Labs  12/16/14 0500 12/17/14 0521 12/18/14 0550 12/20/14 0437 12/21/14 0508 12/22/14 0530 08/27/15  WBC 6.9 7.8 9.7 13.4* 13.0* 12.1* 7.4  NEUTROABS 3.5 3.9 5.6  --   --   --   --   HGB 9.6* 10.6* 10.6* 11.2* 11.2* 10.5* 11.8*  HCT 28.8* 31.8* 32.0* 34.5* 35.4* 33.9* 36*  MCV 88.6 90.6 89.9 92.5 94.1 95.2  --   PLT 214 281 373 458* 466* 418*  305   Lab Results  Component Value Date   TSH 1.38 08/27/2015   Lab Results  Component Value Date   HGBA1C 5.0 08/27/2015   No results found for: CHOL, HDL, LDLCALC, LDLDIRECT, TRIG, CHOLHDL  Significant Diagnostic Results in last 30 days:  No results found.  Assessment/Plan  Accelerated hypertension Mildly elevated Sbp 160s, will try Lisinopril 5mg  daily.   GERD (gastroesophageal reflux disease) Stable, continue Omeprazole 20mg  daily.    Vertebral compression fracture He is presently stable is taking percocet 5/325 mg 1or 2 tabs every 6 hours as needed for pain  AKI (acute kidney injury) 08/27/15 Bun 16, creat 0.9  Schizophrenia Stable, continue Risperdal 3mg  bid, Sertraline 100mg  qd, Trazodone 150mg  qhs, Clonazepam 1mg  qhs, Lorazepam 1mg  q4h prn.   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   Labs/tests ordered: none

## 2015-09-29 ENCOUNTER — Non-Acute Institutional Stay (SKILLED_NURSING_FACILITY): Payer: Medicare Other | Admitting: Nurse Practitioner

## 2015-09-29 ENCOUNTER — Encounter: Payer: Self-pay | Admitting: Nurse Practitioner

## 2015-09-29 DIAGNOSIS — IMO0001 Reserved for inherently not codable concepts without codable children: Secondary | ICD-10-CM

## 2015-09-29 DIAGNOSIS — F32A Depression, unspecified: Secondary | ICD-10-CM

## 2015-09-29 DIAGNOSIS — E870 Hyperosmolality and hypernatremia: Secondary | ICD-10-CM | POA: Diagnosis not present

## 2015-09-29 DIAGNOSIS — K219 Gastro-esophageal reflux disease without esophagitis: Secondary | ICD-10-CM

## 2015-09-29 DIAGNOSIS — R569 Unspecified convulsions: Secondary | ICD-10-CM

## 2015-09-29 DIAGNOSIS — D638 Anemia in other chronic diseases classified elsewhere: Secondary | ICD-10-CM | POA: Diagnosis not present

## 2015-09-29 DIAGNOSIS — F209 Schizophrenia, unspecified: Secondary | ICD-10-CM

## 2015-09-29 DIAGNOSIS — F329 Major depressive disorder, single episode, unspecified: Secondary | ICD-10-CM | POA: Diagnosis not present

## 2015-09-29 DIAGNOSIS — I10 Essential (primary) hypertension: Secondary | ICD-10-CM | POA: Diagnosis not present

## 2015-09-29 DIAGNOSIS — M4850XS Collapsed vertebra, not elsewhere classified, site unspecified, sequela of fracture: Secondary | ICD-10-CM

## 2015-09-29 NOTE — Progress Notes (Signed)
Patient ID: Kevin Humphrey, male   DOB: [birth year], 65 y.o.   MRN: [id number]  Location:  [facility] Room Number: [place] of Service:  SNF (31) Provider: Lennie Odor Priscille Shadduck NP  Estill Dooms, MD  Patient Care Team: Estill Dooms, MD as PCP - General (Internal Medicine)  Extended Emergency Contact Information Primary Emergency Contact: [facility] Address: [address]          [place], [place] [address] [place] Phone: [phone] Relation: Other Secondary Emergency Contact: Nani Ravens [place] of [place] Phone: [phone] Relation: Other  Code Status:  DNR Goals of care: Advanced Directive information Advanced Directives 09/29/2015  Does patient have an advance directive? Yes  Type of Advance Directive Out of facility DNR (pink MOST or yellow form)  Does patient want to make changes to advanced directive? No - Patient declined  Copy of advanced directive(s) in chart? Yes     Chief Complaint  Patient presents with  . Medical Management of Chronic Issues    HPI:  Pt is a 65 y.o. male seen today for medical management of chronic diseases.  Hx of anemia, last Hgb 9.9 02/05/15, 11.8 08/27/15. Mood is manage with Depakote 250mg  bid, Risperdal 3mg  bid, Klonopin 1mg  qhs, Trazodone 150mg  hs, Sertraline 100mg , last valproic acid level 52.8 08/20/15. Morphine 5mg  q2h prn available to him for comfort measures.    Past Medical History  Diagnosis Date  . GERD (gastroesophageal reflux disease) 08/02/2012  . Dysphagia, unspecified 08/02/2012  . Allergic rhinitis 08/02/2012  . Vertebral compression fracture ([place]) 08/02/2012  . Other and unspecified hyperlipidemia 08/02/2012  . Schizophrenia ([place]) 08/02/2012  . Depression 08/02/2012   History reviewed. No pertinent past surgical history.  No Known Allergies    Medication List       This list is accurate as of: 09/29/15 11:59 PM.  Always use your most recent med list.              acetaminophen 325 MG tablet  Commonly known as:  TYLENOL  Take 650 mg by mouth every 4 (four) hours as needed for mild pain, moderate pain, fever or headache.     benztropine 1 MG tablet  Commonly known as:  COGENTIN  Take 1 mg by mouth 2 (two) times daily.     clonazePAM 0.5 MG tablet  Commonly known as:  KLONOPIN  Take 0.5 mg by mouth. Give 0.5 mg every morning and 1 mg at bedtime for anxiety.     divalproex 125 MG DR tablet  Commonly known as:  DEPAKOTE  Take 375 mg by mouth 2 (two) times daily. To stabilize mood     LORazepam 1 MG tablet  Commonly known as:  ATIVAN  Take one tablet by mouth every 4 hours as needed for anxiety     morphine CONCENTRATE 10 MG/0.5ML Soln concentrated solution  Take 0.25 mLs (5 mg total) by mouth every 2 (two) hours as needed for severe pain or shortness of breath.     multivitamin tablet  Take 1 tablet by mouth daily.     omeprazole 20 MG capsule  Commonly known as:  PRILOSEC  Take 20 mg by mouth daily.     risperiDONE 3 MG tablet  Commonly known as:  RISPERDAL  Take 3 mg by mouth 2 (two) times daily.     sertraline 100 MG tablet  Commonly known as:  ZOLOFT  Take 100 mg by mouth daily.  SYSTANE BALANCE 0.6 % Soln  Generic drug:  Propylene Glycol  Apply 1 drop to eye 2 (two) times daily. For dry eyes     traZODone 150 MG tablet  Commonly known as:  DESYREL  Take 150 mg by mouth at bedtime.     trolamine salicylate 10 % cream  Commonly known as:  ASPERCREME  Apply 1 application topically 4 (four) times daily -  before meals and at bedtime.        Review of Systems  Constitutional: Negative for fever, chills, diaphoresis, activity change, appetite change, fatigue and unexpected weight change.  HENT: Negative for congestion, dental problem, drooling, ear discharge, ear pain, facial swelling, hearing loss, mouth sores, nosebleeds, trouble swallowing and voice change.   Eyes: Negative for pain, discharge, redness and itching.    Respiratory: Negative for cough, choking, chest tightness, shortness of breath and wheezing.   Cardiovascular: Negative for chest pain, palpitations and leg swelling.  Gastrointestinal: Negative for nausea, vomiting, abdominal pain, diarrhea, constipation and abdominal distention.  Endocrine: Negative for cold intolerance and heat intolerance.  Genitourinary: Negative for dysuria, urgency, frequency and flank pain.  Musculoskeletal: Positive for arthralgias and gait problem. Negative for myalgias, back pain, joint swelling, neck pain and neck stiffness.       W/c for mobility   Skin: Negative for color change, pallor, rash and wound.  Neurological: Negative for dizziness, tremors, seizures, syncope, facial asymmetry, speech difficulty, numbness and headaches.  Hematological: Negative for adenopathy. Does not bruise/bleed easily.  Psychiatric/Behavioral: Positive for behavioral problems, confusion and decreased concentration. Negative for suicidal ideas, hallucinations, sleep disturbance, self-injury, dysphoric mood and agitation. The patient is not nervous/anxious and is not hyperactive.     Immunization History  Administered Date(s) Administered  . Influenza-Unspecified 02/24/2013, 01/29/2015  . PPD Test 01/29/2015   Pertinent  Health Maintenance Due  Topic Date Due  . COLONOSCOPY  06/17/1999  . PNA vac Low Risk Adult (1 of 2 - PCV13) 06/16/2014  . INFLUENZA VACCINE  11/23/2015   No flowsheet data found. Functional Status Survey:    Filed Vitals:   09/29/15 1446  BP: 152/85  Pulse: 74  Temp: 97.9 F (36.6 C)  TempSrc: Oral  Resp: 18  Height: 5\' 11"  (1.803 m)  Weight: 182 lb (82.555 kg)   Body mass index is 25.4 kg/(m^2). Physical Exam  Constitutional: He appears well-developed and well-nourished. No distress.  HENT:  Head: Normocephalic and atraumatic.  Eyes: Conjunctivae and EOM are normal. Pupils are equal, round, and reactive to light. Right eye exhibits no  discharge. Left eye exhibits no discharge. No scleral icterus.  Neck: Normal range of motion. Neck supple. No tracheal deviation present. No thyromegaly present.  Cardiovascular: Normal rate, regular rhythm and normal heart sounds.   No murmur heard. Pulmonary/Chest: Effort normal and breath sounds normal. No respiratory distress. He has no wheezes. He has no rales.  Abdominal: Soft. Bowel sounds are normal. He exhibits no distension. There is no tenderness. There is no rebound and no guarding.  Musculoskeletal: Normal range of motion. He exhibits no edema or tenderness.  Neurological: He is alert. He displays normal reflexes. No cranial nerve deficit. He exhibits normal muscle tone. Coordination normal.  Skin: Skin is warm and dry. No rash noted. He is not diaphoretic. No erythema.  Psychiatric: He has a normal mood and affect. His behavior is normal.    Labs reviewed:  Recent Labs  12/18/14 0550 12/19/14 0425 12/20/14 [id number] 12/21/14 [id number] 12/22/14 0530 12/24/14 [id number]  08/27/15  NA 140 144 151* 157* 151* 148* 137  K 4.4 3.7 3.8 3.6 3.8 3.4* 4.6  CL 100* 105 111 117* 116* 117*  --   CO2 31 30 31  32 29 24  --   GLUCOSE 108* 119* 126* 130* 137* 110*  --   BUN 29* 32* 37* 30* 27* 19 16  CREATININE 0.92 0.83 1.10 0.99 0.92 0.69 0.9  CALCIUM 8.7* 8.8* 8.6* 8.7* 8.4* 8.3*  --   MG 2.2 2.5* 2.5*  --   --   --   --   PHOS 3.9 3.7 4.1  --   --   --   --     Recent Labs  12/14/14 0446 12/22/14 0530 12/24/14 0635 08/27/15  AST 28 40 23 13*  ALT 42 67* 40 7*  ALKPHOS 61 48 51 59  BILITOT 0.5 0.6 0.5  --   PROT 6.0* 6.5 6.0*  --   ALBUMIN 2.3* 2.4* 2.4*  --     Recent Labs  12/16/14 0500 12/17/14 0521 12/18/14 0550 12/20/14 0437 12/21/14 0508 12/22/14 0530 08/27/15  WBC 6.9 7.8 9.7 13.4* 13.0* 12.1* 7.4  NEUTROABS 3.5 3.9 5.6  --   --   --   --   HGB 9.6* 10.6* 10.6* 11.2* 11.2* 10.5* 11.8*  HCT 28.8* 31.8* 32.0* 34.5* 35.4* 33.9* 36*  MCV 88.6 90.6 89.9 92.5 94.1 95.2  --    PLT 214 281 373 458* 466* 418* 305   Lab Results  Component Value Date   TSH 1.38 08/27/2015   Lab Results  Component Value Date   HGBA1C 5.0 08/27/2015   No results found for: CHOL, HDL, LDLCALC, LDLDIRECT, TRIG, CHOLHDL  Significant Diagnostic Results in last 30 days:  No results found.  Assessment/Plan  Accelerated hypertension Controlled, continue Lisinopril 5mg  daily.   GERD (gastroesophageal reflux disease) Stable, continue Omeprazole 20mg  daily.   Vertebral compression fracture He is presently stable is taking percocet 5/325 mg 1or 2 tabs every 6 hours as needed for pai  Schizophrenia Stable, continue Risperdal 3mg  bid, Sertraline 100mg  qd, Trazodone 150mg  qhs, Clonazepam 1mg  qhs, Lorazepam 1mg  q4h prn.   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   Labs/tests ordered: none

## 2015-09-29 NOTE — Assessment & Plan Note (Signed)
Controlled, continue Lisinopril 5mg  daily.

## 2015-09-29 NOTE — Assessment & Plan Note (Signed)
02/05/15 Hgb 9.9 08/27/15 Hgb 11.8

## 2015-09-29 NOTE — Assessment & Plan Note (Signed)
He is presently stable is taking percocet 5/325 mg 1or 2 tabs every 6 hours as needed for pai

## 2015-09-29 NOTE — Assessment & Plan Note (Signed)
Stable, continue Risperdal 3mg  bid, Sertraline 100mg  qd, Trazodone 150mg  qhs, Clonazepam 1mg  qhs, Lorazepam 1mg  q4h prn.

## 2015-09-29 NOTE — Assessment & Plan Note (Signed)
Seizure free since last visited, continue Depakote 250mg  bid, valproic acid level 52.8 08/20/15

## 2015-09-29 NOTE — Assessment & Plan Note (Signed)
Stable, continue Omeprazole 20mg daily.  

## 2015-09-29 NOTE — Assessment & Plan Note (Signed)
Is emotionally stable, continue zoloft 200 mg daily, 08/27/15 TSH 1.385

## 2015-09-29 NOTE — Assessment & Plan Note (Signed)
08/27/15 Na 137

## 2016-07-26 IMAGING — CR DG CHEST 1V PORT
1 series · 1 of 1 positions shown · non-contrast
Comparison: 12/14/2014.

CLINICAL DATA: Intubation.

EXAM:
PORTABLE CHEST - 1 VIEW

[AP]
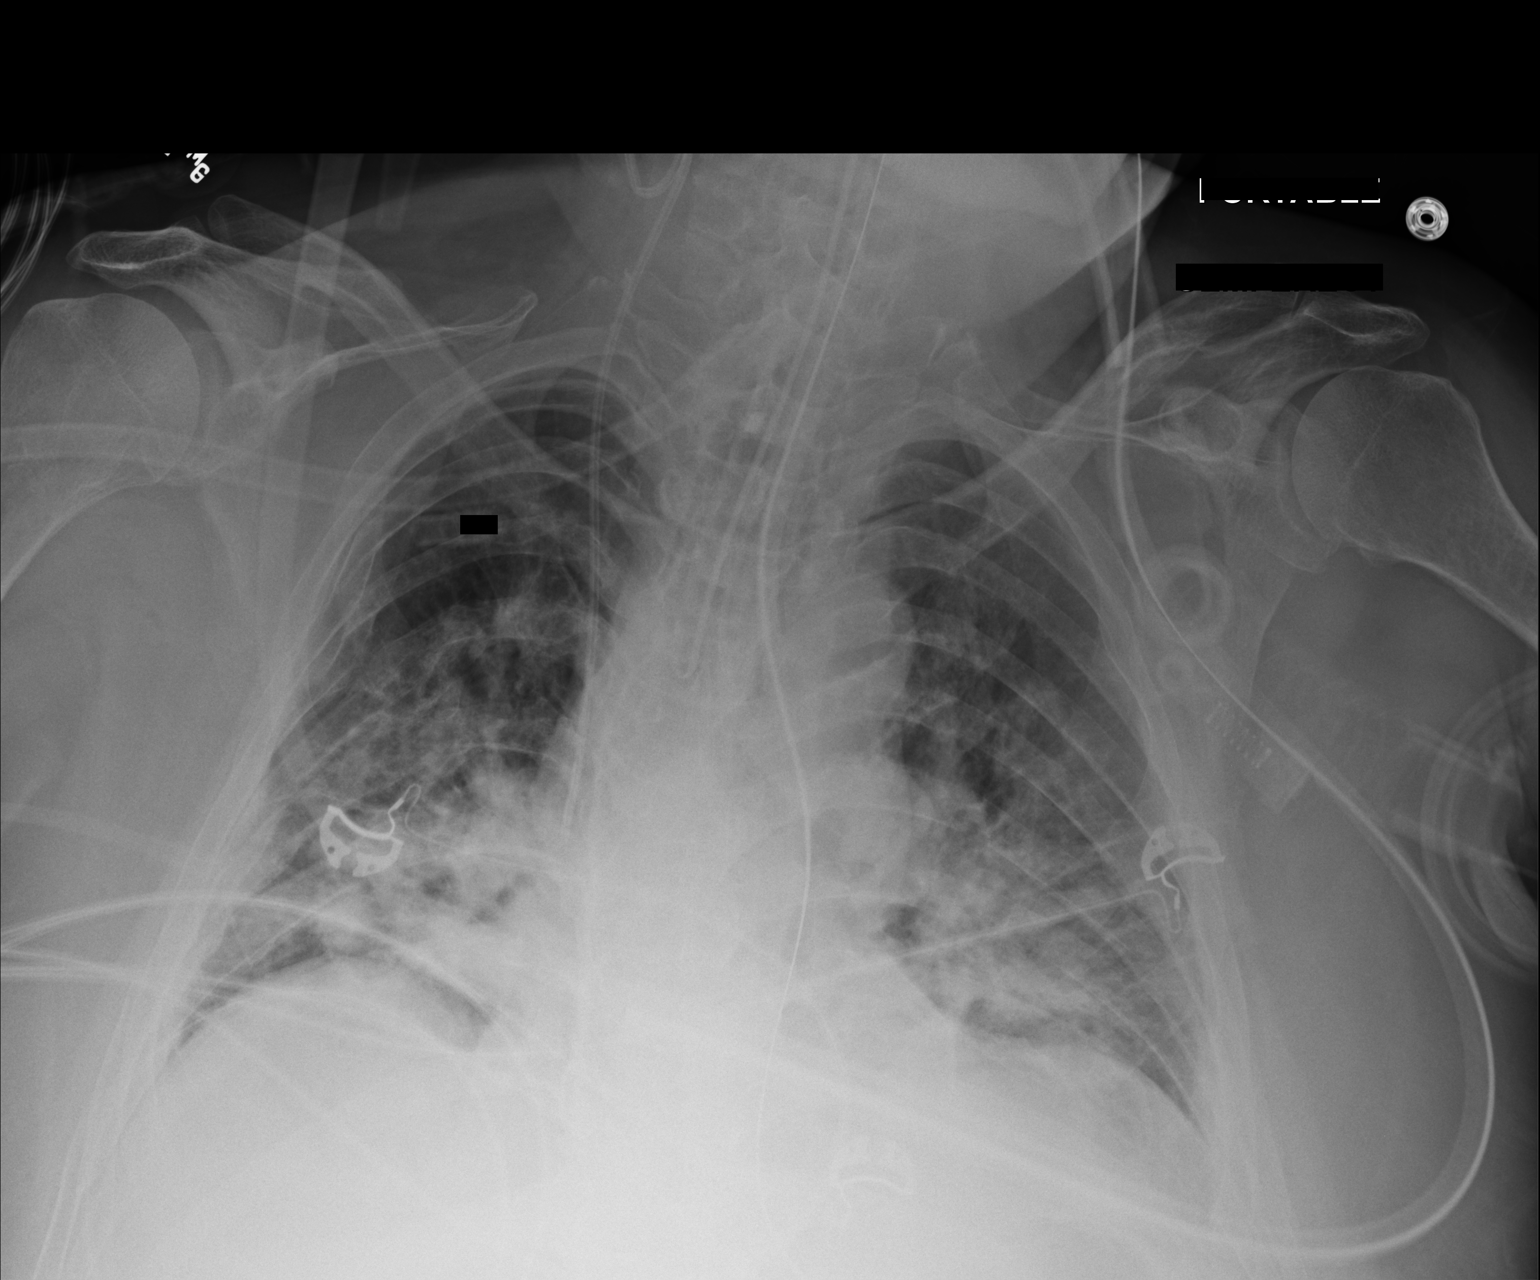

[1 of 1 positions shown; findings below may reference images not displayed]

FINDINGS: Endotracheal tube 1.7 cm above the carina. NG tube tip below left
hemidiaphragm. Right IJ line with tip in cavoatrial junction. Heart
size normal. Continued progression of bibasilar atelectasis and/or
infiltrates. Tiny pleural effusions can't be excluded. No
pneumothorax. Old right rib fractures are present.
IMPRESSION: 1. Endotracheal tube 1.7 cm above the carina. Proximal repositioning
of 1 cm should be considered . NG tube and right IJ line in stable
position.
2. Progressive bibasilar atelectasis and/or infiltrates. Tiny
pleural effusions cannot be excluded. These results will be called
to the ordering clinician or representative by the Radiologist
Assistant, and communication documented in the PACS or zVision
Dashboard.

## 2016-07-28 IMAGING — RF DG SWALLOWING FUNCTION - NRPT [facility]
1 series · 18 of 24 positions shown · non-contrast
Comparison: none

[Series 1: run · 21 acquisitions, 18 frames shown]
[im 1/21]
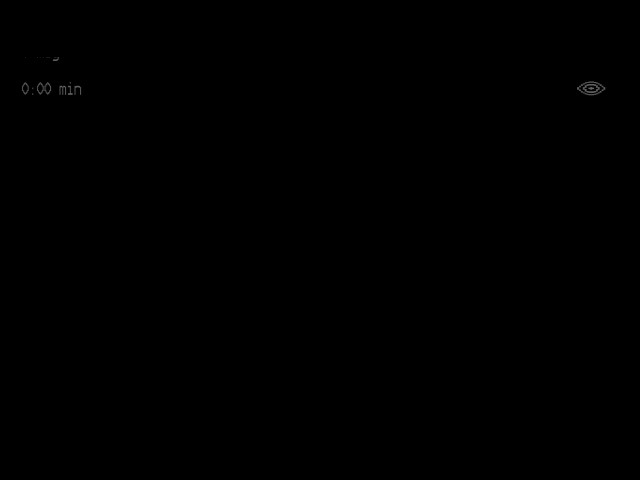
[im 2/21]
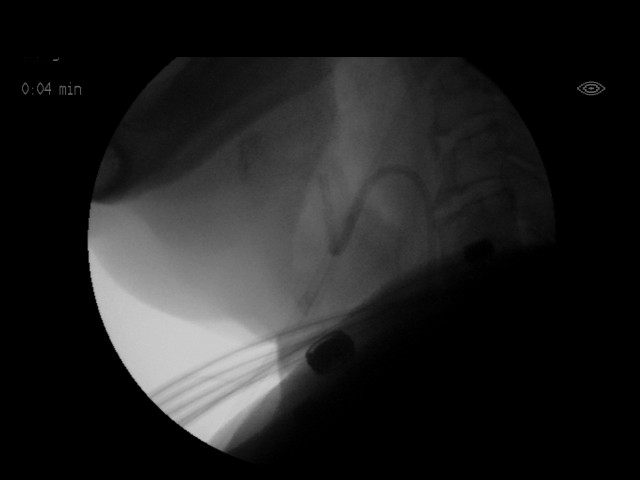
[im 3/21]
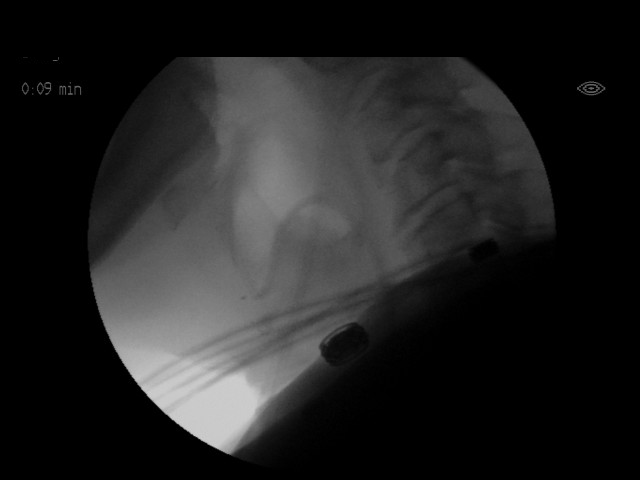
[im 4/21]
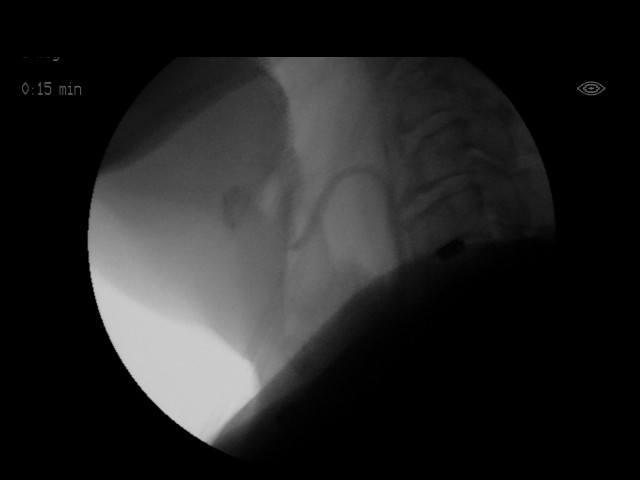
[im 6/21]
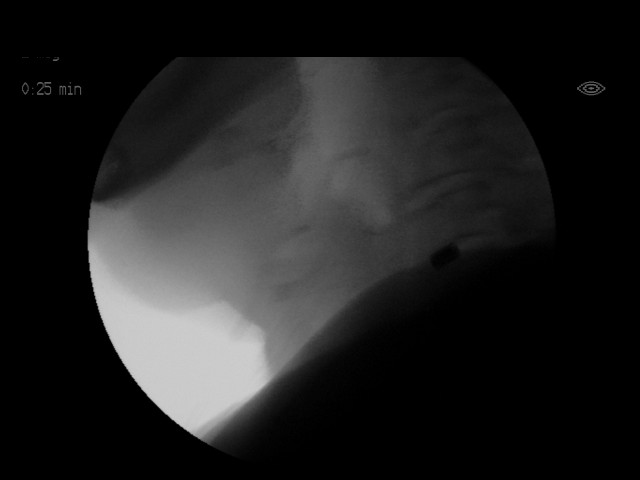
[im 7/21]
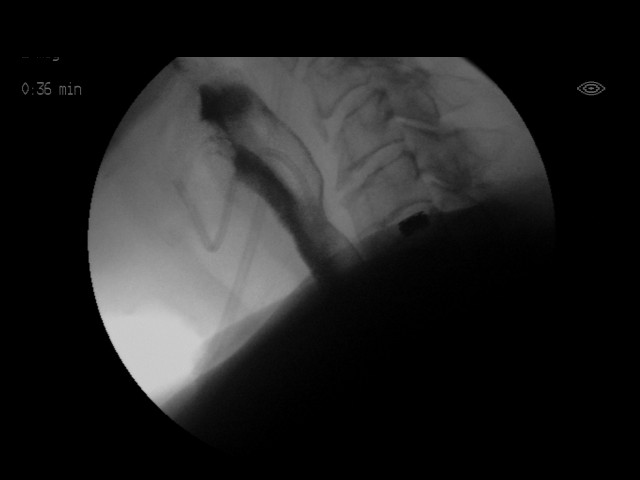
[im 8/21]
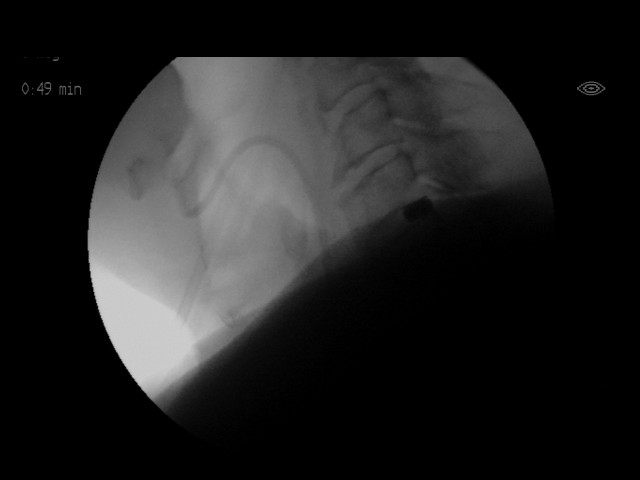
[im 10/21]
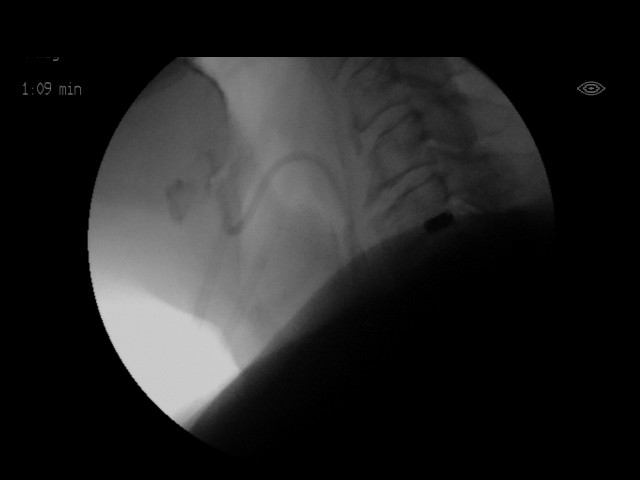
[im 11/21]
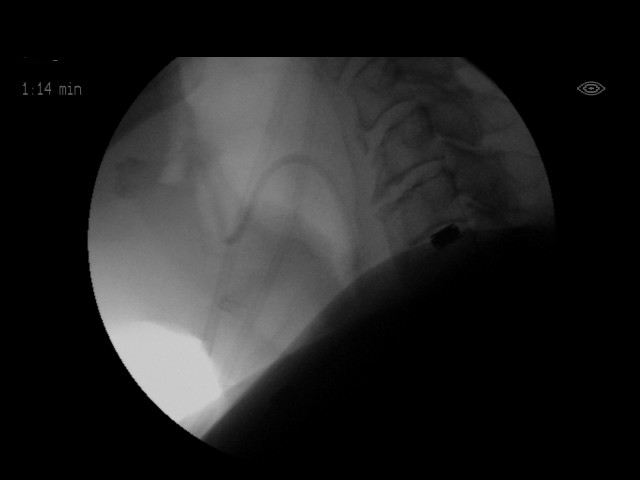
[im 12/21]
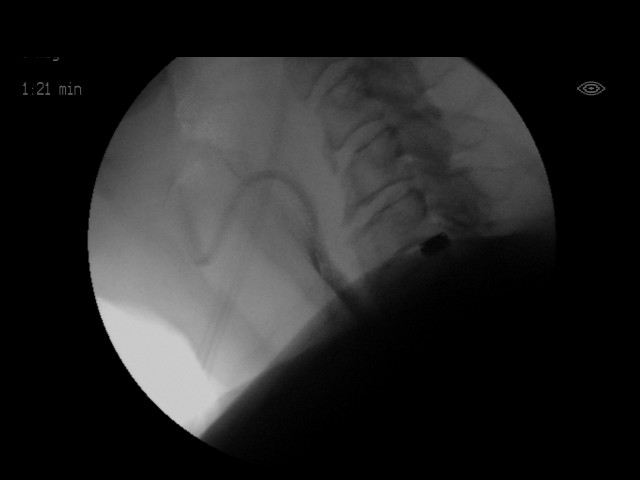
[im 13/21]
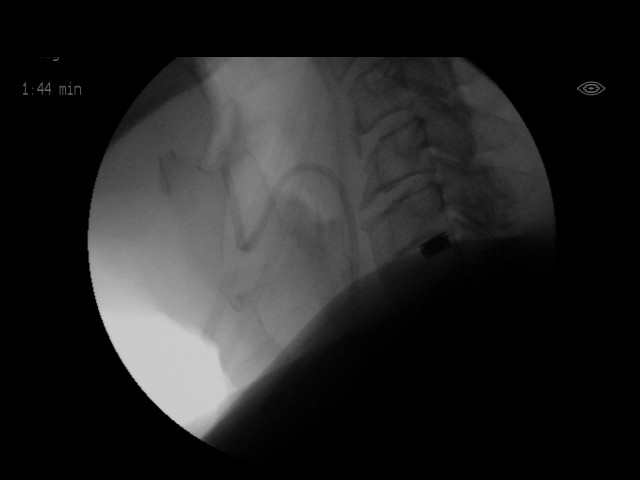
[im 14/21]
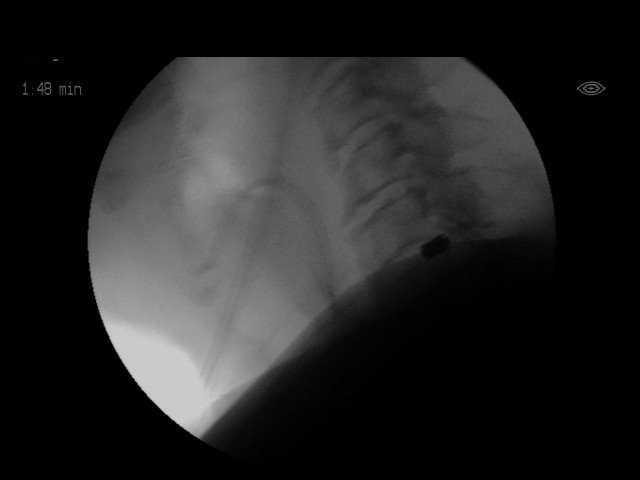
[im 15/21]
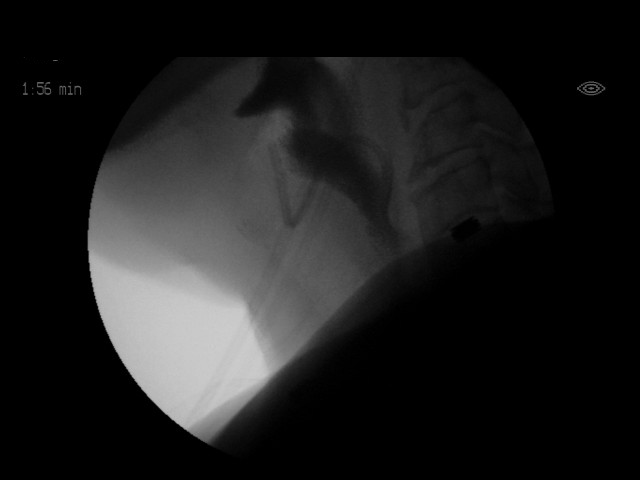
[im 17/21]
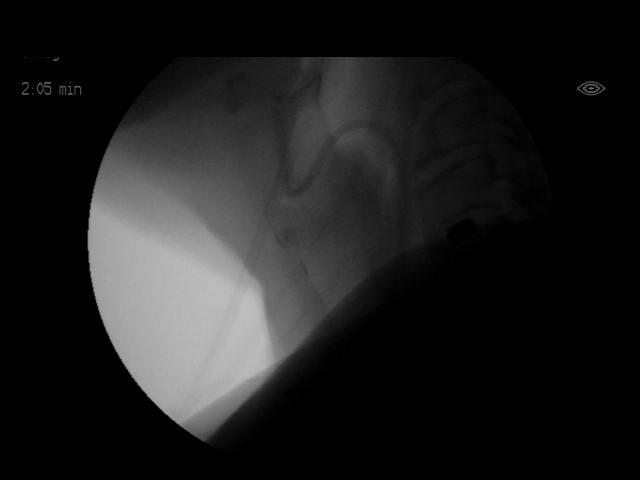
[im 18/21]
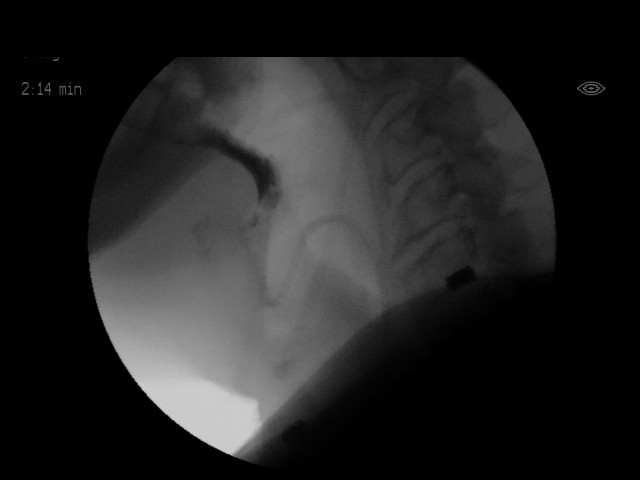
[im 19/21]
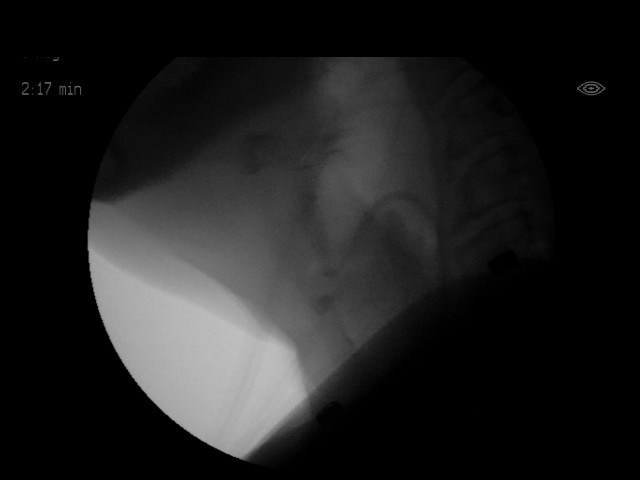
[im 20/21]
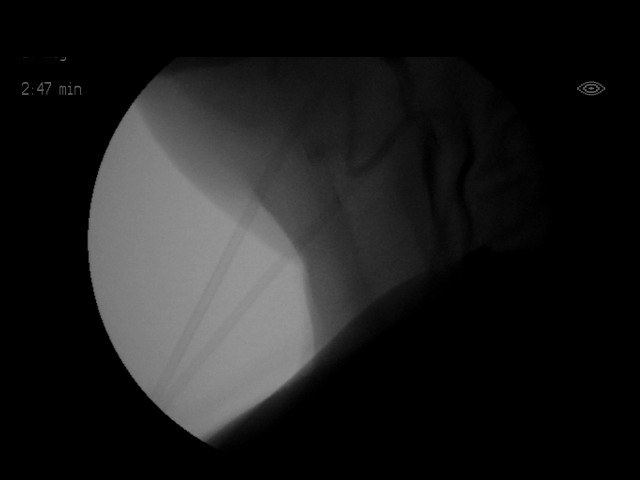
[im 21/21]
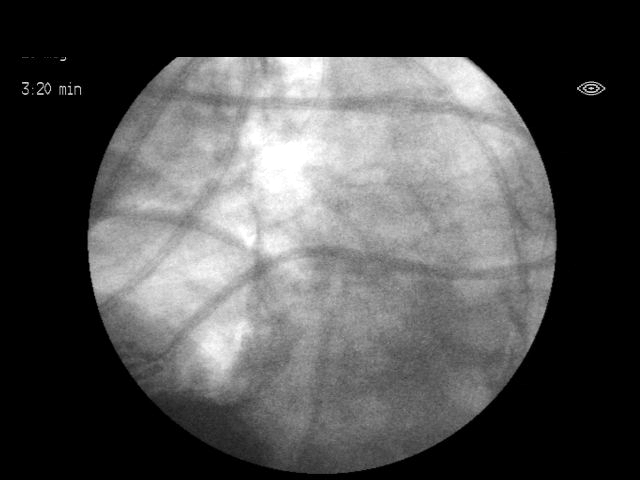

[18 of 24 positions shown; findings below may reference images not displayed]

FLUOROSCOPY FOR SWALLOWING FUNCTION STUDY:
Fluoroscopy was provided for swallowing function study, which was administered by a speech pathologist.  Final results and recommendations from this study are contained within the speech pathology report.

## 2017-08-22 ENCOUNTER — Emergency Department (HOSPITAL_COMMUNITY)
Admission: EM | Admit: 2017-08-22 | Discharge: 2017-08-23 | Disposition: A | Discharge status: Home / Self Care | Payer: Medicare Other | Attending: Emergency Medicine | Admitting: Emergency Medicine

## 2017-08-22 ENCOUNTER — Encounter (HOSPITAL_COMMUNITY): Payer: Self-pay | Admitting: Emergency Medicine

## 2017-08-22 ENCOUNTER — Other Ambulatory Visit: Payer: Self-pay

## 2017-08-22 DIAGNOSIS — Z79899 Other long term (current) drug therapy: Secondary | ICD-10-CM | POA: Insufficient documentation

## 2017-08-22 DIAGNOSIS — W06XXXA Fall from bed, initial encounter: Secondary | ICD-10-CM | POA: Diagnosis not present

## 2017-08-22 DIAGNOSIS — Y999 Unspecified external cause status: Secondary | ICD-10-CM | POA: Diagnosis not present

## 2017-08-22 DIAGNOSIS — Z043 Encounter for examination and observation following other accident: Secondary | ICD-10-CM | POA: Insufficient documentation

## 2017-08-22 DIAGNOSIS — I10 Essential (primary) hypertension: Secondary | ICD-10-CM | POA: Diagnosis not present

## 2017-08-22 DIAGNOSIS — Z87891 Personal history of nicotine dependence: Secondary | ICD-10-CM | POA: Insufficient documentation

## 2017-08-22 DIAGNOSIS — Y92122 Bedroom in nursing home as the place of occurrence of the external cause: Secondary | ICD-10-CM | POA: Insufficient documentation

## 2017-08-22 DIAGNOSIS — W19XXXA Unspecified fall, initial encounter: Secondary | ICD-10-CM

## 2017-08-22 DIAGNOSIS — Y9389 Activity, other specified: Secondary | ICD-10-CM | POA: Insufficient documentation

## 2017-08-22 DIAGNOSIS — R4182 Altered mental status, unspecified: Secondary | ICD-10-CM | POA: Insufficient documentation

## 2017-08-22 NOTE — ED Notes (Signed)
Bed: WA07 Expected date:  Expected time:  Means of arrival:  Comments: 89 yr fall

## 2017-08-22 NOTE — ED Triage Notes (Addendum)
Pt arriving from [facility] for unwitnessed fall from bed less than 1 foot from ground. No blood thinners. Pt having head and back pain. Pt A&O to self (at baseline per facility staff). Pt nonverbal.

## 2017-08-23 DIAGNOSIS — R4182 Altered mental status, unspecified: Secondary | ICD-10-CM | POA: Diagnosis not present

## 2017-08-23 LAB — CBC WITH DIFFERENTIAL/PLATELET
BASOS ABS: 0 10*3/uL (ref 0.0–0.1)
Basophils Relative: 0 %
Eosinophils Absolute: 0.5 10*3/uL (ref 0.0–0.7)
Eosinophils Relative: 6 %
HEMATOCRIT: 34.6 % — AB (ref 39.0–52.0)
Hemoglobin: 11.5 g/dL — ABNORMAL LOW (ref 13.0–17.0)
LYMPHS PCT: 42 %
Lymphs Abs: 3.1 10*3/uL (ref 0.7–4.0)
MCH: 30.6 pg (ref 26.0–34.0)
MCHC: 33.2 g/dL (ref 30.0–36.0)
MCV: 92 fL (ref 78.0–100.0)
MONO ABS: 1.3 10*3/uL — AB (ref 0.1–1.0)
Monocytes Relative: 17 %
NEUTROS ABS: 2.7 10*3/uL (ref 1.7–7.7)
NEUTROS PCT: 35 %
Platelets: 242 10*3/uL (ref 150–400)
RBC: 3.76 MIL/uL — AB (ref 4.22–5.81)
RDW: 13.8 % (ref 11.5–15.5)
WBC: 7.6 10*3/uL (ref 4.0–10.5)

## 2017-08-23 LAB — BASIC METABOLIC PANEL
ANION GAP: 8 (ref 5–15)
BUN: 16 mg/dL (ref 6–20)
CO2: 25 mmol/L (ref 22–32)
Calcium: 8.6 mg/dL — ABNORMAL LOW (ref 8.9–10.3)
Chloride: 101 mmol/L (ref 101–111)
Creatinine, Ser: 0.85 mg/dL (ref 0.61–1.24)
GLUCOSE: 88 mg/dL (ref 65–99)
POTASSIUM: 4.3 mmol/L (ref 3.5–5.1)
Sodium: 134 mmol/L — ABNORMAL LOW (ref 135–145)

## 2017-08-23 MED ORDER — SODIUM CHLORIDE 0.9 % IV BOLUS
1000.0000 mL | Freq: Once | INTRAVENOUS | Status: AC
  Administered 2017-08-23: 1000 mL via INTRAVENOUS

## 2017-08-23 NOTE — Discharge Instructions (Signed)
No injuries were found associated with the fall from bed today.  Continue taking your usual medications, and watch for problems.

## 2017-08-23 NOTE — ED Notes (Signed)
Pt reacted negatively to assistance with urinal at bedside. Obliged to receiving assistance and was anxious and agitated when assistance was given.

## 2017-08-23 NOTE — ED Notes (Signed)
Pt attempted to urinate in urinal at bedside but was unable. Will attempt again

## 2017-08-23 NOTE — ED Notes (Signed)
Pt. REFUSED in and out cath. Condom cath applied. Will notify MD.

## 2017-08-23 NOTE — ED Notes (Signed)
Pt tore off condom cath and urinated on bed and floor. Was observed attempting to exit the bed immediately after.

## 2017-08-23 NOTE — ED Notes (Signed)
Notified pt that urine sample was needed. Pt stated he was unable to void. Will try again later.

## 2017-08-23 NOTE — ED Notes (Signed)
Gave pt [place] sandwich and sprite

## 2017-08-23 NOTE — ED Provider Notes (Signed)
[facility] DEPT Provider Note   CSN: [id number] Arrival date & time: 08/22/17  2341     History   Chief Complaint Chief Complaint  Patient presents with  . Fall    HPI Kevin Humphrey is a 67 y.o. male.   HPI   He presents for evaluation of fall.  He reportedly fell from a bed, which is essentially on the floor, fall right one foot.  No visualized injury.  He is sent here for evaluation.  He is unable to give any history.  Level 5 caveat-altered mental status  Past Medical History:  Diagnosis Date  . Allergic rhinitis 08/02/2012  . Depression 08/02/2012  . Dysphagia, unspecified 08/02/2012  . GERD (gastroesophageal reflux disease) 08/02/2012  . Other and unspecified hyperlipidemia 08/02/2012  . Schizophrenia ([place]) 08/02/2012  . Vertebral compression fracture ([place]) 08/02/2012    Patient Active Problem List   Diagnosis Date Noted  . HCAP (healthcare-associated pneumonia)   . Ventilator dependent ([place])   . Acute cystitis with hematuria   . Bacteremia   . Accelerated hypertension   . Anemia, chronic disease   . Acute kidney injury ([place])   . Hypernatremia   . Protein-calorie malnutrition ([place])   . Acute encephalopathy   . Palliative care encounter 12/17/2014  . Dysphagia 12/17/2014  . Acute respiratory failure with hypoxia ([place]) 12/14/2014  . Staphylococcal pneumonia ([place]) 12/14/2014  . Septic shock ([place]) 12/14/2014  . AKI (acute kidney injury) ([place]) 12/14/2014  . Respiratory failure ([place])   . Pneumonia 12/12/2014  . Malnutrition of moderate degree ([place]) 12/12/2014  . Pressure ulcer 12/12/2014  . Convulsions/seizures ([place]) 08/20/2013  . Other and unspecified hyperlipidemia 08/02/2012  . GERD (gastroesophageal reflux disease) 08/02/2012  . Schizophrenia ([place]) 08/02/2012  . Depression 08/02/2012  . Allergic rhinitis 08/02/2012  . Dysphagia, unspecified(787.20) 08/02/2012  . Vertebral compression fracture ([place]) 08/02/2012    History  reviewed. No pertinent surgical history.      Home Medications    Prior to Admission medications   Medication Sig Start Date End Date Taking? Authorizing Provider  acetaminophen (TYLENOL) 325 MG tablet Take 650 mg by mouth every 4 (four) hours as needed for mild pain, moderate pain, fever or headache.    [provider]  benztropine (COGENTIN) 1 MG tablet Take 1 mg by mouth 2 (two) times daily. 04/15/15   [provider]  clonazePAM (KLONOPIN) 0.5 MG tablet Take 0.5 mg by mouth. Give 0.5 mg every morning and 1 mg at bedtime for anxiety.    [provider]  divalproex (DEPAKOTE) 125 MG DR tablet Take 375 mg by mouth 2 (two) times daily. To stabilize mood    [provider]  LORazepam (ATIVAN) 1 MG tablet Take one tablet by mouth every 4 hours as needed for anxiety 03/08/15   Reed, Tiffany L, DO  Morphine Sulfate (MORPHINE CONCENTRATE) 10 MG/0.5ML SOLN concentrated solution Take 0.25 mLs (5 mg total) by mouth every 2 (two) hours as needed for severe pain or shortness of breath. Patient taking differently: Take 5 mg by mouth every 4 (four) hours as needed for severe pain or shortness of breath.  12/25/14   Elgergawy, Silver Huguenin, MD  Multiple Vitamin (MULTIVITAMIN) tablet Take 1 tablet by mouth daily.    [provider]  omeprazole (PRILOSEC) 20 MG capsule Take 20 mg by mouth daily.    [provider]  Propylene Glycol (SYSTANE BALANCE) 0.6 % SOLN Apply 1 drop to eye 2 (two) times daily.  For dry eyes    [provider]  risperiDONE (RISPERDAL) 3 MG tablet Take 3 mg by mouth 2 (two) times daily.    [provider]  sertraline (ZOLOFT) 100 MG tablet Take 100 mg by mouth daily.    [provider]  traZODone (DESYREL) 150 MG tablet Take 150 mg by mouth at bedtime.    [provider]  trolamine salicylate (ASPERCREME) 10 % cream Apply 1 application topically 4 (four) times daily -  before meals and at bedtime.     [provider]    Family History No family history on file.  Social History Social History   Tobacco Use  . Smoking status: Former Smoker  Substance Use Topics  . Alcohol use: Not on file  . Drug use: Not on file     Allergies   Patient has no known allergies.   Review of Systems Review of Systems  Unable to perform ROS: Mental status change     Physical Exam Updated Vital Signs BP 133/77   Pulse 74   SpO2 99%   Physical Exam  Constitutional: He appears well-developed. No distress (Patient verbalizes "I want a Pepsi.").  He appears under nourished and older than stated age.  HENT:  Head: Normocephalic and atraumatic.  Right Ear: External ear normal.  Left Ear: External ear normal.  No visualized trauma to head face or neck.  Eyes: Pupils are equal, round, and reactive to light. Conjunctivae and EOM are normal.  Neck: Normal range of motion and phonation normal. Neck supple.  Oral mucous members are dry  Cardiovascular: Normal rate, regular rhythm and normal heart sounds.  Pulmonary/Chest: Effort normal and breath sounds normal. He exhibits no bony tenderness.  Abdominal: Soft. There is no tenderness. There is no guarding.  Musculoskeletal: Normal range of motion.  Neurological: He is alert. No cranial nerve deficit or sensory deficit. He exhibits normal muscle tone. Coordination normal.  No dysarthria or aphasia  Skin: Skin is warm, dry and intact.  Psychiatric: He has a normal mood and affect. His behavior is normal.  Peculiar high-pitched speaking voice.  Nursing note and vitals reviewed.    ED Treatments / Results  Labs (all labs ordered are listed, but only abnormal results are displayed) Labs Reviewed  BASIC METABOLIC PANEL - Abnormal; Notable for the following components:      Result Value   Sodium 134 (*)    Calcium 8.6 (*)    All other components within normal limits  CBC WITH DIFFERENTIAL/PLATELET - Abnormal; Notable for the  following components:   RBC 3.76 (*)    Hemoglobin 11.5 (*)    HCT 34.6 (*)    Monocytes Absolute 1.3 (*)    All other components within normal limits  URINALYSIS, ROUTINE W REFLEX MICROSCOPIC    EKG None  Radiology No results found.  Procedures Procedures (including critical care time)  Medications Ordered in ED Medications  sodium chloride 0.9 % bolus 1,000 mL (0 mLs Intravenous Stopped 08/23/17 0250)     Initial Impression / Assessment and Plan / ED Course  I have reviewed the triage vital signs and the nursing notes.  Pertinent labs & imaging results that were available during my care of the patient were reviewed by me and considered in my medical decision making (see chart for details).  Clinical Course as of Aug 23 444  Thu Aug 23, 2017  4431 Normal except sodium low 134 and calcium low 8.6  Basic metabolic panel(!) [  EW]  0446 Normal except hemoglobin 11.5  CBC with Differential(!) [EW]    Clinical Course User Index [EW] Daleen Bo, MD     Patient Vitals for the past 24 hrs:  BP Pulse SpO2  08/23/17 0330 133/77 74 99 %  08/23/17 0230 130/71 73 98 %  08/23/17 0200 135/85 72 98 %  08/23/17 0130 (!) 108/93 78 98 %  08/23/17 0100 132/81 75 96 %  08/23/17 0030 138/85 74 98 %  08/23/17 0012 (!) 142/82 75 97 %  08/22/17 2342 - - 95 %    4:43 AM Reevaluation with update and discussion. After initial assessment and treatment, an updated evaluation reveals no change in clinical status.  Patient would not tolerate efforts to collect urinalysis.  The patient's legal guardian was informed, by voicemail, her name is Blenda Mounts, with [employer], telephone number [phone]. [place] decision making-fall from low level without serious injury.  Clinical and laboratory evaluations are reassuring.  Unable to obtain urinalysis but no reported urinary tract symptoms.  Nursing Notes Reviewed/ Care Coordinated Applicable Imaging  Reviewed Interpretation of Laboratory Data incorporated into ED treatment  The patient appears reasonably screened and/or stabilized for discharge and I doubt any other medical condition or other [facility] requiring further screening, evaluation, or treatment in the ED at this time prior to discharge.  Plan: Home Medications-continue usual medications; Home Treatments-usual diet; return here if the recommended treatment, does not improve the symptoms; Recommended follow up-PCP as needed   Final Clinical Impressions(s) / ED Diagnoses   Final diagnoses:  Fall, initial encounter    ED Discharge Orders    None       Daleen Bo, MD 08/23/17 [phone]

## 2017-09-19 ENCOUNTER — Inpatient Hospital Stay (HOSPITAL_COMMUNITY)
Admission: EM | Admit: 2017-09-19 | Discharge: 2017-10-10 | DRG: 853 | Disposition: A | Discharge status: Skilled Nursing Facility | Payer: Medicare Other | Source: Skilled Nursing Facility | Attending: Internal Medicine | Admitting: Internal Medicine

## 2017-09-19 ENCOUNTER — Emergency Department (HOSPITAL_COMMUNITY): Payer: Medicare Other

## 2017-09-19 ENCOUNTER — Encounter (HOSPITAL_COMMUNITY): Payer: Self-pay | Admitting: Emergency Medicine

## 2017-09-19 DIAGNOSIS — G92 Toxic encephalopathy: Secondary | ICD-10-CM | POA: Diagnosis not present

## 2017-09-19 DIAGNOSIS — G9341 Metabolic encephalopathy: Secondary | ICD-10-CM | POA: Diagnosis not present

## 2017-09-19 DIAGNOSIS — R0602 Shortness of breath: Secondary | ICD-10-CM

## 2017-09-19 DIAGNOSIS — K56609 Unspecified intestinal obstruction, unspecified as to partial versus complete obstruction: Secondary | ICD-10-CM | POA: Diagnosis not present

## 2017-09-19 DIAGNOSIS — N179 Acute kidney failure, unspecified: Secondary | ICD-10-CM | POA: Diagnosis present

## 2017-09-19 DIAGNOSIS — K567 Ileus, unspecified: Secondary | ICD-10-CM | POA: Diagnosis not present

## 2017-09-19 DIAGNOSIS — A419 Sepsis, unspecified organism: Principal | ICD-10-CM | POA: Diagnosis present

## 2017-09-19 DIAGNOSIS — J690 Pneumonitis due to inhalation of food and vomit: Secondary | ICD-10-CM | POA: Diagnosis present

## 2017-09-19 DIAGNOSIS — R410 Disorientation, unspecified: Secondary | ICD-10-CM

## 2017-09-19 DIAGNOSIS — B9689 Other specified bacterial agents as the cause of diseases classified elsewhere: Secondary | ICD-10-CM | POA: Diagnosis not present

## 2017-09-19 DIAGNOSIS — K920 Hematemesis: Secondary | ICD-10-CM | POA: Diagnosis present

## 2017-09-19 DIAGNOSIS — Z6823 Body mass index (BMI) 23.0-23.9, adult: Secondary | ICD-10-CM

## 2017-09-19 DIAGNOSIS — N390 Urinary tract infection, site not specified: Secondary | ICD-10-CM | POA: Diagnosis not present

## 2017-09-19 DIAGNOSIS — E7220 Disorder of urea cycle metabolism, unspecified: Secondary | ICD-10-CM | POA: Diagnosis not present

## 2017-09-19 DIAGNOSIS — Z66 Do not resuscitate: Secondary | ICD-10-CM | POA: Diagnosis present

## 2017-09-19 DIAGNOSIS — R451 Restlessness and agitation: Secondary | ICD-10-CM | POA: Diagnosis not present

## 2017-09-19 DIAGNOSIS — F411 Generalized anxiety disorder: Secondary | ICD-10-CM | POA: Diagnosis not present

## 2017-09-19 DIAGNOSIS — E8770 Fluid overload, unspecified: Secondary | ICD-10-CM | POA: Diagnosis not present

## 2017-09-19 DIAGNOSIS — J9601 Acute respiratory failure with hypoxia: Secondary | ICD-10-CM | POA: Diagnosis not present

## 2017-09-19 DIAGNOSIS — R638 Other symptoms and signs concerning food and fluid intake: Secondary | ICD-10-CM

## 2017-09-19 DIAGNOSIS — K565 Intestinal adhesions [bands], unspecified as to partial versus complete obstruction: Secondary | ICD-10-CM | POA: Diagnosis present

## 2017-09-19 DIAGNOSIS — K219 Gastro-esophageal reflux disease without esophagitis: Secondary | ICD-10-CM | POA: Diagnosis present

## 2017-09-19 DIAGNOSIS — R17 Unspecified jaundice: Secondary | ICD-10-CM | POA: Diagnosis not present

## 2017-09-19 DIAGNOSIS — Z87891 Personal history of nicotine dependence: Secondary | ICD-10-CM

## 2017-09-19 DIAGNOSIS — Z0189 Encounter for other specified special examinations: Secondary | ICD-10-CM

## 2017-09-19 DIAGNOSIS — R945 Abnormal results of liver function studies: Secondary | ICD-10-CM | POA: Diagnosis not present

## 2017-09-19 DIAGNOSIS — D473 Essential (hemorrhagic) thrombocythemia: Secondary | ICD-10-CM | POA: Diagnosis not present

## 2017-09-19 DIAGNOSIS — E440 Moderate protein-calorie malnutrition: Secondary | ICD-10-CM | POA: Diagnosis present

## 2017-09-19 DIAGNOSIS — E870 Hyperosmolality and hypernatremia: Secondary | ICD-10-CM | POA: Diagnosis not present

## 2017-09-19 DIAGNOSIS — L89899 Pressure ulcer of other site, unspecified stage: Secondary | ICD-10-CM | POA: Diagnosis not present

## 2017-09-19 DIAGNOSIS — E876 Hypokalemia: Secondary | ICD-10-CM | POA: Diagnosis not present

## 2017-09-19 DIAGNOSIS — F209 Schizophrenia, unspecified: Secondary | ICD-10-CM | POA: Diagnosis present

## 2017-09-19 DIAGNOSIS — F0390 Unspecified dementia without behavioral disturbance: Secondary | ICD-10-CM | POA: Diagnosis present

## 2017-09-19 DIAGNOSIS — D649 Anemia, unspecified: Secondary | ICD-10-CM | POA: Diagnosis not present

## 2017-09-19 DIAGNOSIS — I10 Essential (primary) hypertension: Secondary | ICD-10-CM | POA: Diagnosis present

## 2017-09-19 DIAGNOSIS — E871 Hypo-osmolality and hyponatremia: Secondary | ICD-10-CM | POA: Diagnosis not present

## 2017-09-19 DIAGNOSIS — T17908A Unspecified foreign body in respiratory tract, part unspecified causing other injury, initial encounter: Secondary | ICD-10-CM

## 2017-09-19 DIAGNOSIS — M62838 Other muscle spasm: Secondary | ICD-10-CM | POA: Diagnosis not present

## 2017-09-19 DIAGNOSIS — G40901 Epilepsy, unspecified, not intractable, with status epilepticus: Secondary | ICD-10-CM | POA: Diagnosis not present

## 2017-09-19 LAB — CBC WITH DIFFERENTIAL/PLATELET
Basophils Absolute: 0 10*3/uL (ref 0.0–0.1)
Basophils Relative: 0 %
EOS PCT: 0 %
Eosinophils Absolute: 0 10*3/uL (ref 0.0–0.7)
HCT: 44.8 % (ref 39.0–52.0)
Hemoglobin: 15.6 g/dL (ref 13.0–17.0)
LYMPHS ABS: 1 10*3/uL (ref 0.7–4.0)
LYMPHS PCT: 10 %
MCH: 32 pg (ref 26.0–34.0)
MCHC: 34.8 g/dL (ref 30.0–36.0)
MCV: 92 fL (ref 78.0–100.0)
MONO ABS: 0.8 10*3/uL (ref 0.1–1.0)
Monocytes Relative: 8 %
Neutro Abs: 8.2 10*3/uL — ABNORMAL HIGH (ref 1.7–7.7)
Neutrophils Relative %: 82 %
PLATELETS: 420 10*3/uL — AB (ref 150–400)
RBC: 4.87 MIL/uL (ref 4.22–5.81)
RDW: 13.5 % (ref 11.5–15.5)
WBC: 10 10*3/uL (ref 4.0–10.5)

## 2017-09-19 LAB — I-STAT CG4 LACTIC ACID, ED: LACTIC ACID, VENOUS: 2.4 mmol/L — AB (ref 0.5–1.9)

## 2017-09-19 LAB — I-STAT CHEM 8, ED
BUN: 24 mg/dL — AB (ref 6–20)
CALCIUM ION: 1.11 mmol/L — AB (ref 1.15–1.40)
CREATININE: 1.6 mg/dL — AB (ref 0.61–1.24)
Chloride: 91 mmol/L — ABNORMAL LOW (ref 101–111)
GLUCOSE: 170 mg/dL — AB (ref 65–99)
HCT: 48 % (ref 39.0–52.0)
HEMOGLOBIN: 16.3 g/dL (ref 13.0–17.0)
Potassium: 4.4 mmol/L (ref 3.5–5.1)
Sodium: 130 mmol/L — ABNORMAL LOW (ref 135–145)
TCO2: 27 mmol/L (ref 22–32)

## 2017-09-19 MED ORDER — LIDOCAINE HCL 2 % IJ SOLN
INTRAMUSCULAR | Status: AC
  Administered 2017-09-20: 03:00:00
  Filled 2017-09-19: qty 20

## 2017-09-19 MED ORDER — SODIUM CHLORIDE 0.9 % IV BOLUS
1000.0000 mL | Freq: Once | INTRAVENOUS | Status: AC
  Administered 2017-09-20: 1000 mL via INTRAVENOUS

## 2017-09-19 NOTE — ED Triage Notes (Signed)
Patient is from [place] and transported via [facility]. Patient is vomiting blood. Earlier today patient was seen by NP and had a KUB x-ray performed. Results indicated a possible early developing or partial distal small bowel obstruction cannot be excluded.

## 2017-09-19 NOTE — ED Provider Notes (Signed)
Angiocath insertion Performed by: Wandra Arthurs  Consent: Verbal consent obtained. Risks and benefits: risks, benefits and alternatives were discussed Time out: Immediately prior to procedure a "time out" was called to verify the correct patient, procedure, equipment, support staff and site/side marked as required.  Preparation: Patient was prepped and draped in the usual sterile fashion.  Vein Location: L brachial  Ultrasound Guided  Gauge: 20 long   Normal blood return and flush without difficulty Patient tolerance: Patient tolerated the procedure well with no immediate complications.      Drenda Freeze, MD 09/19/17 2352

## 2017-09-19 NOTE — ED Provider Notes (Addendum)
MSE was initiated and I personally evaluated the patient and placed orders (if any) at  10:54 PM on Sep 19, 2017.  The patient appears stable so that the remainder of the MSE may be completed by another provider.  Patient arrives from nursing home with listlessness, hypotension, tachycardia. On arrival the patient is minimally interactive, though this is not clear if it is far from baseline. Patient has no IV access, nurses are attempting to obtain this, to obtain labs, begin fluid resuscitation.  Soon thereafter I was called to bedside to assist with IV access. An I/O device was placed w/o complication and IVF were started, as additional access was obtained by colleagues.    Carmin Muskrat, MD 09/19/17 2254  Interosseous Line insertion Performed by: Carmin Muskrat  Consent: implied given critical condition of patient  Time out: Immediately prior to procedure a "time out" was called to verify the correct patient, procedure, equipment, support staff and site/side marked as required.  Preparation: Patient was prepped and draped in the usual sterile fashion.  IO placed in R tibial proximal surface  Line flushed with Lidocaine and subsequently NS was administered via pressure bag.  Normal blood return and flush without difficulty Patient tolerance: Patient tolerated the procedure well with no immediate complications.       Carmin Muskrat, MD 09/20/17 [phone]

## 2017-09-20 ENCOUNTER — Emergency Department (HOSPITAL_COMMUNITY): Payer: Medicare Other

## 2017-09-20 ENCOUNTER — Other Ambulatory Visit: Payer: Self-pay

## 2017-09-20 ENCOUNTER — Inpatient Hospital Stay (HOSPITAL_COMMUNITY): Payer: Medicare Other

## 2017-09-20 ENCOUNTER — Encounter (HOSPITAL_COMMUNITY): Payer: Self-pay

## 2017-09-20 DIAGNOSIS — K219 Gastro-esophageal reflux disease without esophagitis: Secondary | ICD-10-CM | POA: Diagnosis not present

## 2017-09-20 DIAGNOSIS — R000 Tachycardia, unspecified: Secondary | ICD-10-CM

## 2017-09-20 DIAGNOSIS — R17 Unspecified jaundice: Secondary | ICD-10-CM | POA: Diagnosis not present

## 2017-09-20 DIAGNOSIS — E440 Moderate protein-calorie malnutrition: Secondary | ICD-10-CM | POA: Diagnosis present

## 2017-09-20 DIAGNOSIS — R451 Restlessness and agitation: Secondary | ICD-10-CM | POA: Diagnosis not present

## 2017-09-20 DIAGNOSIS — N179 Acute kidney failure, unspecified: Secondary | ICD-10-CM

## 2017-09-20 DIAGNOSIS — R0602 Shortness of breath: Secondary | ICD-10-CM

## 2017-09-20 DIAGNOSIS — E870 Hyperosmolality and hypernatremia: Secondary | ICD-10-CM | POA: Diagnosis not present

## 2017-09-20 DIAGNOSIS — R569 Unspecified convulsions: Secondary | ICD-10-CM | POA: Diagnosis not present

## 2017-09-20 DIAGNOSIS — J9601 Acute respiratory failure with hypoxia: Secondary | ICD-10-CM | POA: Diagnosis not present

## 2017-09-20 DIAGNOSIS — Z7189 Other specified counseling: Secondary | ICD-10-CM | POA: Diagnosis not present

## 2017-09-20 DIAGNOSIS — R509 Fever, unspecified: Secondary | ICD-10-CM | POA: Diagnosis not present

## 2017-09-20 DIAGNOSIS — F411 Generalized anxiety disorder: Secondary | ICD-10-CM | POA: Diagnosis not present

## 2017-09-20 DIAGNOSIS — J690 Pneumonitis due to inhalation of food and vomit: Secondary | ICD-10-CM | POA: Diagnosis present

## 2017-09-20 DIAGNOSIS — K567 Ileus, unspecified: Secondary | ICD-10-CM | POA: Diagnosis not present

## 2017-09-20 DIAGNOSIS — A419 Sepsis, unspecified organism: Secondary | ICD-10-CM | POA: Diagnosis present

## 2017-09-20 DIAGNOSIS — Z66 Do not resuscitate: Secondary | ICD-10-CM | POA: Diagnosis present

## 2017-09-20 DIAGNOSIS — K565 Intestinal adhesions [bands], unspecified as to partial versus complete obstruction: Secondary | ICD-10-CM | POA: Diagnosis present

## 2017-09-20 DIAGNOSIS — K920 Hematemesis: Secondary | ICD-10-CM | POA: Diagnosis not present

## 2017-09-20 DIAGNOSIS — E7220 Disorder of urea cycle metabolism, unspecified: Secondary | ICD-10-CM

## 2017-09-20 DIAGNOSIS — R638 Other symptoms and signs concerning food and fluid intake: Secondary | ICD-10-CM | POA: Diagnosis not present

## 2017-09-20 DIAGNOSIS — F061 Catatonic disorder due to known physiological condition: Secondary | ICD-10-CM | POA: Diagnosis not present

## 2017-09-20 DIAGNOSIS — D649 Anemia, unspecified: Secondary | ICD-10-CM | POA: Diagnosis not present

## 2017-09-20 DIAGNOSIS — L89899 Pressure ulcer of other site, unspecified stage: Secondary | ICD-10-CM | POA: Diagnosis not present

## 2017-09-20 DIAGNOSIS — E871 Hypo-osmolality and hyponatremia: Secondary | ICD-10-CM | POA: Diagnosis not present

## 2017-09-20 DIAGNOSIS — F0390 Unspecified dementia without behavioral disturbance: Secondary | ICD-10-CM | POA: Diagnosis present

## 2017-09-20 DIAGNOSIS — G92 Toxic encephalopathy: Secondary | ICD-10-CM | POA: Diagnosis not present

## 2017-09-20 DIAGNOSIS — E876 Hypokalemia: Secondary | ICD-10-CM | POA: Diagnosis not present

## 2017-09-20 DIAGNOSIS — N390 Urinary tract infection, site not specified: Secondary | ICD-10-CM | POA: Diagnosis not present

## 2017-09-20 DIAGNOSIS — K56609 Unspecified intestinal obstruction, unspecified as to partial versus complete obstruction: Secondary | ICD-10-CM | POA: Diagnosis present

## 2017-09-20 DIAGNOSIS — G9341 Metabolic encephalopathy: Secondary | ICD-10-CM | POA: Diagnosis not present

## 2017-09-20 DIAGNOSIS — E8770 Fluid overload, unspecified: Secondary | ICD-10-CM | POA: Diagnosis not present

## 2017-09-20 DIAGNOSIS — F209 Schizophrenia, unspecified: Secondary | ICD-10-CM | POA: Diagnosis not present

## 2017-09-20 DIAGNOSIS — Z515 Encounter for palliative care: Secondary | ICD-10-CM | POA: Diagnosis not present

## 2017-09-20 DIAGNOSIS — I10 Essential (primary) hypertension: Secondary | ICD-10-CM | POA: Diagnosis present

## 2017-09-20 LAB — CBC WITH DIFFERENTIAL/PLATELET
Basophils Absolute: 0 10*3/uL (ref 0.0–0.1)
Basophils Relative: 0 %
EOS ABS: 0 10*3/uL (ref 0.0–0.7)
EOS PCT: 0 %
HCT: 39.3 % (ref 39.0–52.0)
Hemoglobin: 13.7 g/dL (ref 13.0–17.0)
LYMPHS ABS: 1 10*3/uL (ref 0.7–4.0)
Lymphocytes Relative: 9 %
MCH: 32.1 pg (ref 26.0–34.0)
MCHC: 34.9 g/dL (ref 30.0–36.0)
MCV: 92 fL (ref 78.0–100.0)
MONOS PCT: 11 %
Monocytes Absolute: 1.2 10*3/uL — ABNORMAL HIGH (ref 0.1–1.0)
NEUTROS PCT: 80 %
Neutro Abs: 8.6 10*3/uL — ABNORMAL HIGH (ref 1.7–7.7)
PLATELETS: 350 10*3/uL (ref 150–400)
RBC: 4.27 MIL/uL (ref 4.22–5.81)
RDW: 13.8 % (ref 11.5–15.5)
WBC: 10.7 10*3/uL — AB (ref 4.0–10.5)

## 2017-09-20 LAB — URINALYSIS, ROUTINE W REFLEX MICROSCOPIC
BILIRUBIN URINE: NEGATIVE
Glucose, UA: NEGATIVE mg/dL
Hgb urine dipstick: NEGATIVE
KETONES UR: NEGATIVE mg/dL
Leukocytes, UA: NEGATIVE
NITRITE: NEGATIVE
Protein, ur: NEGATIVE mg/dL
Specific Gravity, Urine: 1.017 (ref 1.005–1.030)
pH: 6 (ref 5.0–8.0)

## 2017-09-20 LAB — PROTIME-INR
INR: 1.06
INR: 1.14
PROTHROMBIN TIME: 14.6 s (ref 11.4–15.2)
Prothrombin Time: 13.7 seconds (ref 11.4–15.2)

## 2017-09-20 LAB — GLUCOSE, CAPILLARY
Glucose-Capillary: 100 mg/dL — ABNORMAL HIGH (ref 65–99)
Glucose-Capillary: 106 mg/dL — ABNORMAL HIGH (ref 65–99)
Glucose-Capillary: 107 mg/dL — ABNORMAL HIGH (ref 65–99)

## 2017-09-20 LAB — TYPE AND SCREEN
ABO/RH(D): O POS
Antibody Screen: POSITIVE
PT AG Type: NEGATIVE

## 2017-09-20 LAB — COMPREHENSIVE METABOLIC PANEL
ALBUMIN: 3.4 g/dL — AB (ref 3.5–5.0)
ALT: 19 U/L (ref 17–63)
ALT: 21 U/L (ref 17–63)
AST: 24 U/L (ref 15–41)
AST: 27 U/L (ref 15–41)
Albumin: 4.1 g/dL (ref 3.5–5.0)
Alkaline Phosphatase: 66 U/L (ref 38–126)
Alkaline Phosphatase: 51 U/L (ref 38–126)
Anion gap: 16 — ABNORMAL HIGH (ref 5–15)
Anion gap: 13 (ref 5–15)
BUN: 25 mg/dL — ABNORMAL HIGH (ref 6–20)
BUN: 33 mg/dL — ABNORMAL HIGH (ref 6–20)
CHLORIDE: 90 mmol/L — AB (ref 101–111)
CHLORIDE: 96 mmol/L — AB (ref 101–111)
CO2: 25 mmol/L (ref 22–32)
CO2: 27 mmol/L (ref 22–32)
CREATININE: 1.65 mg/dL — AB (ref 0.61–1.24)
Calcium: 9.4 mg/dL (ref 8.9–10.3)
Calcium: 8.4 mg/dL — ABNORMAL LOW (ref 8.9–10.3)
Creatinine, Ser: 1.82 mg/dL — ABNORMAL HIGH (ref 0.61–1.24)
GFR calc Af Amer: 48 mL/min — ABNORMAL LOW (ref >60)
GFR calc non Af Amer: 41 mL/min — ABNORMAL LOW (ref >60)
GFR calc non Af Amer: 36 mL/min — ABNORMAL LOW (ref >60)
GFR, EST AFRICAN AMERICAN: 42 mL/min — AB (ref >60)
GLUCOSE: 138 mg/dL — AB (ref 65–99)
Glucose, Bld: 173 mg/dL — ABNORMAL HIGH (ref 65–99)
POTASSIUM: 4.5 mmol/L (ref 3.5–5.1)
Potassium: 4.5 mmol/L (ref 3.5–5.1)
SODIUM: 131 mmol/L — AB (ref 135–145)
SODIUM: 136 mmol/L (ref 135–145)
Total Bilirubin: 0.6 mg/dL (ref 0.3–1.2)
Total Bilirubin: 0.7 mg/dL (ref 0.3–1.2)
Total Protein: 7.9 g/dL (ref 6.5–8.1)
Total Protein: 6.6 g/dL (ref 6.5–8.1)

## 2017-09-20 LAB — POC OCCULT BLOOD, ED: Fecal Occult Bld: POSITIVE — AB

## 2017-09-20 LAB — CREATININE, URINE, RANDOM: CREATININE, URINE: 144.93 mg/dL

## 2017-09-20 LAB — TROPONIN I
TROPONIN I: 0.03 ng/mL — AB (ref <0.03)
Troponin I: <0.03 ng/mL (ref <0.03)
Troponin I: 0.03 ng/mL (ref <0.03)

## 2017-09-20 LAB — MAGNESIUM: MAGNESIUM: 1.5 mg/dL — AB (ref 1.7–2.4)

## 2017-09-20 LAB — VALPROIC ACID LEVEL: VALPROIC ACID LVL: 18 ug/mL — AB (ref 50.0–100.0)

## 2017-09-20 LAB — APTT: APTT: 31 s (ref 24–36)

## 2017-09-20 LAB — SODIUM, URINE, RANDOM: SODIUM UR: 39 mmol/L

## 2017-09-20 LAB — PROCALCITONIN: PROCALCITONIN: 4.83 ng/mL

## 2017-09-20 LAB — HIV ANTIBODY (ROUTINE TESTING W REFLEX): HIV Screen 4th Generation wRfx: NONREACTIVE

## 2017-09-20 LAB — HEMOGLOBIN A1C
HEMOGLOBIN A1C: 4.9 % (ref 4.8–5.6)
MEAN PLASMA GLUCOSE: 93.93 mg/dL

## 2017-09-20 LAB — AMMONIA: AMMONIA: 24 umol/L (ref 9–35)

## 2017-09-20 LAB — MRSA PCR SCREENING: MRSA BY PCR: POSITIVE — AB

## 2017-09-20 LAB — LIPASE, BLOOD: Lipase: 19 U/L (ref 11–51)

## 2017-09-20 LAB — BRAIN NATRIURETIC PEPTIDE: B Natriuretic Peptide: 39.9 pg/mL (ref 0.0–100.0)

## 2017-09-20 LAB — LACTIC ACID, PLASMA
LACTIC ACID, VENOUS: 1.3 mmol/L (ref 0.5–1.9)
Lactic Acid, Venous: 2.6 mmol/L (ref 0.5–1.9)

## 2017-09-20 LAB — I-STAT CG4 LACTIC ACID, ED: LACTIC ACID, VENOUS: 1.79 mmol/L (ref 0.5–1.9)

## 2017-09-20 MED ORDER — IOPAMIDOL (ISOVUE-300) INJECTION 61%
INTRAVENOUS | Status: AC
  Filled 2017-09-20: qty 100

## 2017-09-20 MED ORDER — CHLORHEXIDINE GLUCONATE CLOTH 2 % EX PADS
6.0000 | MEDICATED_PAD | Freq: Every day | CUTANEOUS | Status: DC
  Administered 2017-09-21 – 2017-09-23 (×3): 6 via TOPICAL

## 2017-09-20 MED ORDER — MUPIROCIN 2 % EX OINT
1.0000 "application " | TOPICAL_OINTMENT | Freq: Two times a day (BID) | CUTANEOUS | Status: AC
  Administered 2017-09-20 – 2017-09-25 (×10): 1 via NASAL
  Filled 2017-09-20: qty 22

## 2017-09-20 MED ORDER — SODIUM CHLORIDE 0.9 % IV SOLN
INTRAVENOUS | Status: DC
  Administered 2017-09-20 – 2017-09-21 (×3): via INTRAVENOUS

## 2017-09-20 MED ORDER — VANCOMYCIN HCL IN DEXTROSE 750-5 MG/150ML-% IV SOLN
750.0000 mg | INTRAVENOUS | Status: DC
  Administered 2017-09-21: 750 mg via INTRAVENOUS
  Filled 2017-09-20: qty 150

## 2017-09-20 MED ORDER — HYDRALAZINE HCL 20 MG/ML IJ SOLN
10.0000 mg | INTRAMUSCULAR | Status: DC | PRN
  Administered 2017-09-23 – 2017-10-08 (×11): 10 mg via INTRAVENOUS
  Filled 2017-09-20 (×11): qty 1

## 2017-09-20 MED ORDER — ORAL CARE MOUTH RINSE
15.0000 mL | Freq: Two times a day (BID) | OROMUCOSAL | Status: DC
  Administered 2017-09-20 – 2017-10-10 (×39): 15 mL via OROMUCOSAL

## 2017-09-20 MED ORDER — VALPROATE SODIUM 500 MG/5ML IV SOLN
250.0000 mg | Freq: Three times a day (TID) | INTRAVENOUS | Status: DC
  Administered 2017-09-20 – 2017-09-28 (×24): 250 mg via INTRAVENOUS
  Filled 2017-09-20 (×29): qty 2.5

## 2017-09-20 MED ORDER — SODIUM CHLORIDE 0.9 % IV SOLN
INTRAVENOUS | Status: DC
  Administered 2017-09-20: 06:00:00 via INTRAVENOUS

## 2017-09-20 MED ORDER — DIATRIZOATE MEGLUMINE & SODIUM 66-10 % PO SOLN
90.0000 mL | Freq: Once | ORAL | Status: AC
  Administered 2017-09-20: 90 mL via NASOGASTRIC
  Filled 2017-09-20 (×2): qty 90

## 2017-09-20 MED ORDER — ONDANSETRON HCL 4 MG PO TABS: 4.0000 mg | ORAL_TABLET | Freq: Four times a day (QID) | ORAL | Status: DC | PRN

## 2017-09-20 MED ORDER — IOPAMIDOL (ISOVUE-300) INJECTION 61%: 100.0000 mL | Freq: Once | INTRAVENOUS | Status: DC | PRN

## 2017-09-20 MED ORDER — ACETAMINOPHEN 650 MG RE SUPP
650.0000 mg | Freq: Once | RECTAL | Status: DC
Start: 2017-09-20 — End: 2017-09-20

## 2017-09-20 MED ORDER — VANCOMYCIN HCL IN DEXTROSE 1-5 GM/200ML-% IV SOLN: 1000.0000 mg | INTRAVENOUS | Status: DC

## 2017-09-20 MED ORDER — SODIUM CHLORIDE 0.9 % IV BOLUS
1000.0000 mL | Freq: Once | INTRAVENOUS | Status: AC
  Administered 2017-09-20: 1000 mL via INTRAVENOUS

## 2017-09-20 MED ORDER — VANCOMYCIN HCL IN DEXTROSE 1-5 GM/200ML-% IV SOLN: 1000.0000 mg | Freq: Once | INTRAVENOUS | Status: DC

## 2017-09-20 MED ORDER — ACETAMINOPHEN 650 MG RE SUPP
650.0000 mg | Freq: Four times a day (QID) | RECTAL | Status: DC | PRN
  Administered 2017-09-23 – 2017-10-08 (×3): 650 mg via RECTAL
  Filled 2017-09-20 (×3): qty 1

## 2017-09-20 MED ORDER — FAMOTIDINE IN NACL 20-0.9 MG/50ML-% IV SOLN
20.0000 mg | INTRAVENOUS | Status: DC
  Administered 2017-09-20 – 2017-09-21 (×2): 20 mg via INTRAVENOUS
  Filled 2017-09-20 (×2): qty 50

## 2017-09-20 MED ORDER — ACETAMINOPHEN 325 MG PO TABS: 650.0000 mg | ORAL_TABLET | Freq: Four times a day (QID) | ORAL | Status: DC | PRN

## 2017-09-20 MED ORDER — VANCOMYCIN HCL 10 G IV SOLR
1500.0000 mg | Freq: Once | INTRAVENOUS | Status: AC
  Administered 2017-09-20: 1500 mg via INTRAVENOUS
  Filled 2017-09-20: qty 1500

## 2017-09-20 MED ORDER — MAGNESIUM SULFATE 4 GM/100ML IV SOLN
4.0000 g | Freq: Once | INTRAVENOUS | Status: AC
  Administered 2017-09-20: 4 g via INTRAVENOUS
  Filled 2017-09-20: qty 100

## 2017-09-20 MED ORDER — PIPERACILLIN-TAZOBACTAM 3.375 G IVPB 30 MIN
3.3750 g | Freq: Once | INTRAVENOUS | Status: AC
  Administered 2017-09-20: 3.375 g via INTRAVENOUS
  Filled 2017-09-20: qty 50

## 2017-09-20 MED ORDER — PIPERACILLIN-TAZOBACTAM 3.375 G IVPB
3.3750 g | Freq: Three times a day (TID) | INTRAVENOUS | Status: DC
  Administered 2017-09-20 – 2017-09-29 (×29): 3.375 g via INTRAVENOUS
  Filled 2017-09-20 (×29): qty 50

## 2017-09-20 MED ORDER — ONDANSETRON HCL 4 MG/2ML IJ SOLN
4.0000 mg | Freq: Four times a day (QID) | INTRAMUSCULAR | Status: DC | PRN
  Administered 2017-09-22 – 2017-09-25 (×4): 4 mg via INTRAVENOUS
  Filled 2017-09-20 (×4): qty 2

## 2017-09-20 NOTE — ED Notes (Signed)
Pt iv removed and provider notified Warm compress and towel placed on arm  IV removed

## 2017-09-20 NOTE — Consult Note (Signed)
Reason for Consult: SBO Referring Physician: Dr Carilyn Goodpasture is an 67 y.o. male.  HPI: Pt with h/o advanced dementia and schizophrenia was brought from the skilled nursing facility after patient was found to have persistent nausea vomiting with hematemesis.  Chest x-ray done at this nursing facility showed some infiltrates and abdomen x-ray was showing concerning for bowel obstruction.  Pt is DNR and possibly in hospice at his SNF.      Past Medical History:  Diagnosis Date  . Allergic rhinitis 08/02/2012  . Depression 08/02/2012  . Dysphagia, unspecified 08/02/2012  . GERD (gastroesophageal reflux disease) 08/02/2012  . Other and unspecified hyperlipidemia 08/02/2012  . Schizophrenia ([place]) 08/02/2012  . Vertebral compression fracture ([place]) 08/02/2012    History reviewed. No pertinent surgical history.  Family History  Family history unknown: Yes    Social History:  reports that he has quit smoking. He has never used smokeless tobacco. He reports that he drank alcohol. He reports that he has current or past drug history.  Allergies: No Known Allergies  Medications: I have reviewed the patient's current medications.  Results for orders placed or performed during the hospital encounter of 09/19/17 (from the past 48 hour(s))  Lipase, blood     Status: None   Collection Time: 09/19/17 11:24 PM  Result Value Ref Range   Lipase 19 11 - 51 U/L    Comment: Performed at [facility], [place] [address] [place], [place] [address]  Comprehensive metabolic panel     Status: Abnormal   Collection Time: 09/19/17 11:24 PM  Result Value Ref Range   Sodium 131 (L) 135 - 145 mmol/L   Potassium 4.5 3.5 - 5.1 mmol/L   Chloride 90 (L) 101 - 111 mmol/L   CO2 25 22 - 32 mmol/L   Glucose, Bld 173 (H) 65 - 99 mg/dL   BUN 25 (H) 6 - 20 mg/dL   Creatinine, Ser 1.65 (H) 0.61 - 1.24 mg/dL   Calcium 9.4 8.9 - 10.3 mg/dL   Total Protein 7.9 6.5 - 8.1 g/dL   Albumin 4.1 3.5 -  5.0 g/dL   AST 24 15 - 41 U/L   ALT 19 17 - 63 U/L   Alkaline Phosphatase 66 38 - 126 U/L   Total Bilirubin 0.6 0.3 - 1.2 mg/dL   GFR calc non Af Amer 41 (L) >60 mL/min   GFR calc Af Amer 48 (L) >60 mL/min    Comment: (NOTE) The eGFR has been calculated using the CKD EPI equation. This calculation has not been validated in all clinical situations. eGFR's persistently <60 mL/min signify possible Chronic Kidney Disease.    Anion gap 16 (H) 5 - 15    Comment: Performed at [facility], [place] [address], [place], [place] [address]  Brain natriuretic peptide     Status: None   Collection Time: 09/19/17 11:24 PM  Result Value Ref Range   B Natriuretic Peptide 39.9 0.0 - 100.0 pg/mL    Comment: Performed at [facility], [place] [address] [place], [place] [address]  Troponin I     Status: None   Collection Time: 09/19/17 11:24 PM  Result Value Ref Range   Troponin I <0.03 <0.03 ng/mL    Comment: Performed at [facility], [place] [address] [place], [place] [address]  CBC with Differential     Status: Abnormal   Collection Time: 09/19/17 11:24 PM  Result Value Ref Range   WBC  10.0 4.0 - 10.5 K/uL   RBC 4.87 4.22 - 5.81 MIL/uL   Hemoglobin 15.6 13.0 - 17.0 g/dL   HCT 44.8 39.0 - 52.0 %   MCV 92.0 78.0 - 100.0 fL   MCH 32.0 26.0 - 34.0 pg   MCHC 34.8 30.0 - 36.0 g/dL   RDW 13.5 11.5 - 15.5 %   Platelets 420 (H) 150 - 400 K/uL   Neutrophils Relative % 82 %   Neutro Abs 8.2 (H) 1.7 - 7.7 K/uL   Lymphocytes Relative 10 %   Lymphs Abs 1.0 0.7 - 4.0 K/uL   Monocytes Relative 8 %   Monocytes Absolute 0.8 0.1 - 1.0 K/uL   Eosinophils Relative 0 %   Eosinophils Absolute 0.0 0.0 - 0.7 K/uL   Basophils Relative 0 %   Basophils Absolute 0.0 0.0 - 0.1 K/uL    Comment: Performed at [facility], [place] [address], [place], [place] [address]  Type and screen [place]     Status: None   Collection Time:  09/19/17 11:33 PM  Result Value Ref Range   ABO/RH(D) O POS    Antibody Screen POS    Sample Expiration 09/22/2017    Antibody Identification ANTI JKA (Kidd a)    PT AG Type      NEGATIVE FOR KIDD A ANTIGEN Performed at [facility], [place] [address] [place],  [address]   I-Stat CG4 Lactic Acid, ED     Status: Abnormal   Collection Time: 09/19/17 11:41 PM  Result Value Ref Range   Lactic Acid, Venous 2.40 (HH) 0.5 - 1.9 mmol/L   Comment NOTIFIED PHYSICIAN   I-Stat Chem 8, ED     Status: Abnormal   Collection Time: 09/19/17 11:41 PM  Result Value Ref Range   Sodium 130 (L) 135 - 145 mmol/L   Potassium 4.4 3.5 - 5.1 mmol/L   Chloride 91 (L) 101 - 111 mmol/L   BUN 24 (H) 6 - 20 mg/dL   Creatinine, Ser 1.60 (H) 0.61 - 1.24 mg/dL   Glucose, Bld 170 (H) 65 - 99 mg/dL   Calcium, Ion 1.11 (L) 1.15 - 1.40 mmol/L   TCO2 27 22 - 32 mmol/L   Hemoglobin 16.3 13.0 - 17.0 g/dL   HCT 48.0 39.0 - 52.0 %  Urinalysis, Routine w reflex microscopic     Status: Abnormal   Collection Time: 09/20/17  1:28 AM  Result Value Ref Range   Color, Urine YELLOW YELLOW   APPearance HAZY (A) CLEAR   Specific Gravity, Urine 1.017 1.005 - 1.030   pH 6.0 5.0 - 8.0   Glucose, UA NEGATIVE NEGATIVE mg/dL   Hgb urine dipstick NEGATIVE NEGATIVE   Bilirubin Urine NEGATIVE NEGATIVE   Ketones, ur NEGATIVE NEGATIVE mg/dL   Protein, ur NEGATIVE NEGATIVE mg/dL   Nitrite NEGATIVE NEGATIVE   Leukocytes, UA NEGATIVE NEGATIVE    Comment: Performed at [facility], [place] [address], [place],  [address]  POC occult blood, ED     Status: Abnormal   Collection Time: 09/20/17  1:42 AM  Result Value Ref Range   Fecal Occult Bld POSITIVE (A) NEGATIVE  I-Stat CG4 Lactic Acid, ED     Status: None   Collection Time: 09/20/17  4:26 AM  Result Value Ref Range   Lactic Acid, Venous 1.79 0.5 - 1.9 mmol/L    Ct Abdomen Pelvis Wo Contrast  Result Date: 09/20/2017 CLINICAL DATA:   Acute onset  of generalized abdominal pain, nausea, vomiting and fever. EXAM: CT ABDOMEN AND PELVIS WITHOUT CONTRAST TECHNIQUE: Multidetector CT imaging of the abdomen and pelvis was performed following the standard protocol without IV contrast. COMPARISON:  CT of the abdomen and pelvis performed 10/16/2014 FINDINGS: Lower chest: Trace bilateral pleural effusions are noted. Patchy bibasilar airspace opacities may reflect pneumonia. Scattered coronary artery calcifications are seen. Hepatobiliary: The liver is unremarkable in appearance. The patient is status post cholecystectomy, with clips noted at the gallbladder fossa. The common bile duct remains normal in caliber. Pancreas: The pancreas is within normal limits. Spleen: The spleen is unremarkable in appearance. Adrenals/Urinary Tract: The adrenal glands are unremarkable in appearance. Nonspecific perinephric stranding is noted bilaterally. There is no evidence of hydronephrosis. No renal or ureteral stones are identified. Scattered bilateral renal cysts are noted; an isodense cyst on the right is stable from 2016. Stomach/Bowel: The stomach is unremarkable in appearance. There is diffuse distention of small-bowel loops to 6.0 cm in maximal diameter, with a focal transition point is at the mid abdomen along the proximal ileum. There is complete decompression of more distal ileal loops. This is concerning for high-grade small bowel obstruction. Underlying mild mesenteric edema is noted. The appendix is normal in caliber, without evidence of appendicitis. The colon is unremarkable in appearance. Mild wall thickening at the rectum could reflect mild proctitis. Vascular/Lymphatic: Scattered calcification is seen along the abdominal aorta and its branches. The abdominal aorta is otherwise grossly unremarkable. The inferior vena cava is grossly unremarkable. No retroperitoneal lymphadenopathy is seen. No pelvic sidewall lymphadenopathy is identified. Reproductive: The  bladder is decompressed and not well characterized. The prostate remains normal in size. Other: No additional soft tissue abnormalities are seen. A right femoral line is noted. Musculoskeletal: No acute osseous abnormalities are identified. Minimal chronic loss of height is noted at the lower thoracic spine. The visualized musculature is unremarkable in appearance. IMPRESSION: 1. Diffuse distention of small-bowel loops to 6.0 cm in maximal diameter, with a focal transition point at the mid abdomen along the proximal ileum. There is complete decompression of more distal ileal loops. This is concerning for high-grade small bowel obstruction. Underlying mild mesenteric edema noted. 2. Trace bilateral pleural effusions. Patchy bibasilar airspace opacities may reflect pneumonia. 3. Mild wall thickening at the rectum could reflect mild proctitis. 4. Scattered coronary artery calcifications seen. 5. Scattered bilateral renal cysts noted. Aortic Atherosclerosis (ICD10-I70.0). Electronically Signed   By: Garald Balding M.D.   On: 09/20/2017 04:13   Dg Abdomen 1 View  Result Date: 09/20/2017 CLINICAL DATA:  67 y/o  M; NG tube positioning. EXAM: ABDOMEN - 1 VIEW COMPARISON:  09/20/2017 CT abdomen and pelvis. FINDINGS: Diffuse small bowel dilatation. Enteric tube tip projects over the distal stomach. Moderate degenerative changes of the lumbar spine with mild levocurvature. IMPRESSION: Enteric tube tip projects over distal stomach. Diffuse small bowel dilatation. Electronically Signed   By: Kristine Garbe M.D.   On: 09/20/2017 04:26   Dg Chest Port 1 View  Result Date: 09/20/2017 CLINICAL DATA:  Altered mental status EXAM: PORTABLE CHEST 1 VIEW COMPARISON:  December 20, 2014 FINDINGS: Chronic right rib fractures. Cardiomediastinal silhouette is normal. The left lung is clear. Possible mild opacity in the right upper lung. No other acute abnormalities. IMPRESSION: Possible opacity in the right upper lung versus  confluence of shadows. Recommend a PA and lateral chest x-ray for better evaluation. Electronically Signed   By: Dorise Bullion III M.D   On: 09/20/2017 00:19  Dg Abd Portable 1 View  Result Date: 09/20/2017 CLINICAL DATA:  Nasogastric tube placement. EXAM: PORTABLE ABDOMEN - 1 VIEW COMPARISON:  CT of the abdomen and pelvis from 10/16/2014 FINDINGS: There is diffuse distention of small-bowel loops to 5.5 cm in diameter, raising concern for high-grade small bowel obstruction. Residual stool is noted in the colon. The stomach is partially filled with air. The patient's enteric tube is noted ending overlying the body of the stomach, with the side port about the gastroesophageal junction. No acute osseous abnormalities are seen. No free intra-abdominal air is seen, though evaluation for free air is limited on a single supine view. A right femoral line is noted. IMPRESSION: 1. Diffuse distention of small-bowel loops to 5.5 cm in diameter, raising concern for high-grade small bowel obstruction. No free intra-abdominal air seen. 2. Enteric tube noted ending overlying the body of the stomach, with the side port about the gastroesophageal junction. These results were called by telephone at the time of interpretation on 09/20/2017 at 1:12 am to Dr. Joseph Berkshire, who verbally acknowledged these results. Electronically Signed   By: Garald Balding M.D.   On: 09/20/2017 01:13    Review of Systems  Unable to perform ROS: Psychiatric disorder   Blood pressure 97/71, pulse (!) 123, temperature (!) 101 F (38.3 C), temperature source Rectal, resp. rate (!) 27, SpO2 94 %. Physical Exam  Constitutional: He is oriented to person, place, and time. He appears well-developed and well-nourished. No distress.  HENT:  Head: Normocephalic and atraumatic.  Eyes: Pupils are equal, round, and reactive to light. Conjunctivae are normal.  Neck: Normal range of motion.  Cardiovascular: Regular rhythm.  Respiratory: Effort  normal. No respiratory distress.  GI: He exhibits distension. There is no tenderness. There is no rebound and no guarding.  Musculoskeletal: Normal range of motion.  Neurological: He is alert and oriented to person, place, and time.  Skin: Skin is warm and dry. He is not diaphoretic.    Assessment/Plan: Pt is a poor surgical candidate.  Would rec continued NG decompression for now.  Please obtain hospice papperwork from SNF.      Loni Delbridge C. 11/01/6267, 4:85 AM

## 2017-09-20 NOTE — Progress Notes (Signed)
MRSA PCR positive. MRSA PCR positive standing orders initiated minus contact precautions due to unit protocol.

## 2017-09-20 NOTE — ED Provider Notes (Addendum)
[facility] DEPT Provider Note   CSN: [id number] Arrival date & time: 09/19/17  2211     History   Chief Complaint Chief Complaint  Patient presents with  . Hematemesis    HPI Kevin Humphrey is a 67 y.o. male.  Patient transferred to emergency department by ambulance from nursing home.  Patient reportedly experiencing nausea and vomiting earlier today, some concern for vomiting blood.  X-ray performed at nursing home raise concern for possible partial small bowel obstruction.  At arrival to the ER, patient does not follow any commands or answer questions. Level V Caveat due to acuity.     Past Medical History:  Diagnosis Date  . Allergic rhinitis 08/02/2012  . Depression 08/02/2012  . Dysphagia, unspecified 08/02/2012  . GERD (gastroesophageal reflux disease) 08/02/2012  . Other and unspecified hyperlipidemia 08/02/2012  . Schizophrenia ([place]) 08/02/2012  . Vertebral compression fracture ([place]) 08/02/2012    Patient Active Problem List   Diagnosis Date Noted  . HCAP (healthcare-associated pneumonia)   . Ventilator dependent ([place])   . Acute cystitis with hematuria   . Bacteremia   . Accelerated hypertension   . Anemia, chronic disease   . Acute kidney injury ([place])   . Hypernatremia   . Protein-calorie malnutrition ([place])   . Acute encephalopathy   . Palliative care encounter 12/17/2014  . Dysphagia 12/17/2014  . Acute respiratory failure with hypoxia ([place]) 12/14/2014  . Staphylococcal pneumonia ([place]) 12/14/2014  . Septic shock ([place]) 12/14/2014  . AKI (acute kidney injury) ([place]) 12/14/2014  . Respiratory failure ([place])   . Pneumonia 12/12/2014  . Malnutrition of moderate degree ([place]) 12/12/2014  . Pressure ulcer 12/12/2014  . Convulsions/seizures ([place]) 08/20/2013  . Other and unspecified hyperlipidemia 08/02/2012  . GERD (gastroesophageal reflux disease) 08/02/2012  . Schizophrenia ([place]) 08/02/2012  . Depression 08/02/2012  . Allergic  rhinitis 08/02/2012  . Dysphagia, unspecified(787.20) 08/02/2012  . Vertebral compression fracture ([place]) 08/02/2012    History reviewed. No pertinent surgical history.      Home Medications    Prior to Admission medications   Medication Sig Start Date End Date Taking? Authorizing Provider  acetaminophen (TYLENOL) 325 MG tablet Take 650 mg by mouth 3 (three) times daily.    Yes [provider]  benztropine (COGENTIN) 1 MG tablet Take 1 mg by mouth 2 (two) times daily. 04/15/15  Yes [provider]  buPROPion (WELLBUTRIN) 75 MG tablet Take 150 mg by mouth daily.   Yes [provider]  Cholecalciferol (VITAMIN D3) 50000 units CAPS Take 50,000 Units by mouth every 30 (thirty) days. On the 15th   Yes [provider]  clonazePAM (KLONOPIN) 0.5 MG tablet Take 1 mg by mouth at bedtime.    Yes [provider]  divalproex (DEPAKOTE SPRINKLE) 125 MG capsule Take 250 mg by mouth 3 (three) times daily.   Yes [provider]  ferrous sulfate 325 (65 FE) MG tablet Take 325 mg by mouth daily.   Yes [provider]  lisinopril (PRINIVIL,ZESTRIL) 5 MG tablet Take 5 mg by mouth daily.   Yes [provider]  Multiple Vitamin (MULTIVITAMIN) tablet Take 1 tablet by mouth daily.   Yes [provider]  ondansetron (ZOFRAN) 4 MG tablet Take 4 mg by mouth every 8 (eight) hours as needed for nausea or vomiting.   Yes [provider]  Propylene Glycol (SYSTANE BALANCE) 0.6 % SOLN Apply 1 drop to eye 2 (two) times daily. For dry eyes   Yes  [provider]  ranitidine (ZANTAC) 75 MG tablet Take 75 mg by mouth daily.   Yes [provider]  risperiDONE (RISPERDAL) 3 MG tablet Take 3 mg by mouth 2 (two) times daily.   Yes [provider]  traZODone (DESYREL) 100 MG tablet Take 100 mg by mouth at bedtime as needed for sleep.   Yes [provider]  LORazepam (ATIVAN) 1 MG tablet Take one tablet by  mouth every 4 hours as needed for anxiety Patient not taking: Reported on 09/20/2017 03/08/15   Mariea Clonts, Tiffany L, DO  Morphine Sulfate (MORPHINE CONCENTRATE) 10 MG/0.5ML SOLN concentrated solution Take 0.25 mLs (5 mg total) by mouth every 2 (two) hours as needed for severe pain or shortness of breath. Patient not taking: Reported on 09/20/2017 12/25/14   Elgergawy, Silver Huguenin, MD    Family History History reviewed. No pertinent family history.  Social History Social History   Tobacco Use  . Smoking status: Former Research scientist (life sciences)  . Smokeless tobacco: Never Used  Substance Use Topics  . Alcohol use: Not Currently  . Drug use: Not Currently     Allergies   Patient has no known allergies.   Review of Systems Review of Systems  Unable to perform ROS: Acuity of condition     Physical Exam Updated Vital Signs BP 95/71 (BP Location: Right Arm)   Pulse (!) 109   Temp (!) 101 F (38.3 C) (Rectal)   Resp (!) 21   SpO2 92%   Physical Exam  Constitutional: He is oriented to person, place, and time. He appears well-developed and well-nourished. He appears listless. No distress.  HENT:  Head: Normocephalic and atraumatic.  Right Ear: Hearing normal.  Left Ear: Hearing normal.  Nose: Nose normal.  Mouth/Throat: Oropharynx is clear and moist and mucous membranes are normal.  Eyes: Pupils are equal, round, and reactive to light. Conjunctivae and EOM are normal.  Neck: Normal range of motion. Neck supple.  Cardiovascular: Regular rhythm, S1 normal and S2 normal. Tachycardia present. Exam reveals no gallop and no friction rub.  No murmur heard. Pulmonary/Chest: Effort normal and breath sounds normal. No respiratory distress. He exhibits no tenderness.  Abdominal: He exhibits distension. Bowel sounds are decreased. There is no hepatosplenomegaly. No hernia.  Musculoskeletal: Normal range of motion.  Neurological: He is oriented to person, place, and time. He has normal strength. He appears  listless. No cranial nerve deficit or sensory deficit. Coordination normal. GCS eye subscore is 3. GCS verbal subscore is 3. GCS motor subscore is 5.  Skin: Skin is warm, dry and intact. No rash noted. No cyanosis.  Nursing note and vitals reviewed.    ED Treatments / Results  Labs (all labs ordered are listed, but only abnormal results are displayed) Labs Reviewed  COMPREHENSIVE METABOLIC PANEL - Abnormal; Notable for the following components:      Result Value   Sodium 131 (*)    Chloride 90 (*)    Glucose, Bld 173 (*)    BUN 25 (*)    Creatinine, Ser 1.65 (*)    GFR calc non Af Amer 41 (*)    GFR calc Af Amer 48 (*)    Anion gap 16 (*)    All other components within normal limits  CBC WITH DIFFERENTIAL/PLATELET - Abnormal; Notable for the following components:   Platelets 420 (*)    Neutro Abs 8.2 (*)    All other components within normal limits  URINALYSIS, ROUTINE W REFLEX MICROSCOPIC - Abnormal;  Notable for the following components:   APPearance HAZY (*)    All other components within normal limits  I-STAT CG4 LACTIC ACID, ED - Abnormal; Notable for the following components:   Lactic Acid, Venous 2.40 (*)    All other components within normal limits  I-STAT CHEM 8, ED - Abnormal; Notable for the following components:   Sodium 130 (*)    Chloride 91 (*)    BUN 24 (*)    Creatinine, Ser 1.60 (*)    Glucose, Bld 170 (*)    Calcium, Ion 1.11 (*)    All other components within normal limits  POC OCCULT BLOOD, ED - Abnormal; Notable for the following components:   Fecal Occult Bld POSITIVE (*)    All other components within normal limits  CULTURE, BLOOD (ROUTINE X 2)  CULTURE, BLOOD (ROUTINE X 2)  URINE CULTURE  LIPASE, BLOOD  BRAIN NATRIURETIC PEPTIDE  TROPONIN I  I-STAT CG4 LACTIC ACID, ED  I-STAT CG4 LACTIC ACID, ED  I-STAT CG4 LACTIC ACID, ED  TYPE AND SCREEN    EKG None  Radiology Ct Abdomen Pelvis Wo Contrast  Result Date: 09/20/2017 CLINICAL DATA:   Acute onset of generalized abdominal pain, nausea, vomiting and fever. EXAM: CT ABDOMEN AND PELVIS WITHOUT CONTRAST TECHNIQUE: Multidetector CT imaging of the abdomen and pelvis was performed following the standard protocol without IV contrast. COMPARISON:  CT of the abdomen and pelvis performed 10/16/2014 FINDINGS: Lower chest: Trace bilateral pleural effusions are noted. Patchy bibasilar airspace opacities may reflect pneumonia. Scattered coronary artery calcifications are seen. Hepatobiliary: The liver is unremarkable in appearance. The patient is status post cholecystectomy, with clips noted at the gallbladder fossa. The common bile duct remains normal in caliber. Pancreas: The pancreas is within normal limits. Spleen: The spleen is unremarkable in appearance. Adrenals/Urinary Tract: The adrenal glands are unremarkable in appearance. Nonspecific perinephric stranding is noted bilaterally. There is no evidence of hydronephrosis. No renal or ureteral stones are identified. Scattered bilateral renal cysts are noted; an isodense cyst on the right is stable from 2016. Stomach/Bowel: The stomach is unremarkable in appearance. There is diffuse distention of small-bowel loops to 6.0 cm in maximal diameter, with a focal transition point is at the mid abdomen along the proximal ileum. There is complete decompression of more distal ileal loops. This is concerning for high-grade small bowel obstruction. Underlying mild mesenteric edema is noted. The appendix is normal in caliber, without evidence of appendicitis. The colon is unremarkable in appearance. Mild wall thickening at the rectum could reflect mild proctitis. Vascular/Lymphatic: Scattered calcification is seen along the abdominal aorta and its branches. The abdominal aorta is otherwise grossly unremarkable. The inferior vena cava is grossly unremarkable. No retroperitoneal lymphadenopathy is seen. No pelvic sidewall lymphadenopathy is identified. Reproductive: The  bladder is decompressed and not well characterized. The prostate remains normal in size. Other: No additional soft tissue abnormalities are seen. A right femoral line is noted. Musculoskeletal: No acute osseous abnormalities are identified. Minimal chronic loss of height is noted at the lower thoracic spine. The visualized musculature is unremarkable in appearance. IMPRESSION: 1. Diffuse distention of small-bowel loops to 6.0 cm in maximal diameter, with a focal transition point at the mid abdomen along the proximal ileum. There is complete decompression of more distal ileal loops. This is concerning for high-grade small bowel obstruction. Underlying mild mesenteric edema noted. 2. Trace bilateral pleural effusions. Patchy bibasilar airspace opacities may reflect pneumonia. 3. Mild wall thickening at the rectum could reflect  mild proctitis. 4. Scattered coronary artery calcifications seen. 5. Scattered bilateral renal cysts noted. Aortic Atherosclerosis (ICD10-I70.0). Electronically Signed   By: Garald Balding M.D.   On: 09/20/2017 04:13   Dg Abdomen 1 View  Result Date: 09/20/2017 CLINICAL DATA:  67 y/o  M; NG tube positioning. EXAM: ABDOMEN - 1 VIEW COMPARISON:  09/20/2017 CT abdomen and pelvis. FINDINGS: Diffuse small bowel dilatation. Enteric tube tip projects over the distal stomach. Moderate degenerative changes of the lumbar spine with mild levocurvature. IMPRESSION: Enteric tube tip projects over distal stomach. Diffuse small bowel dilatation. Electronically Signed   By: Kristine Garbe M.D.   On: 09/20/2017 04:26   Dg Chest Port 1 View  Result Date: 09/20/2017 CLINICAL DATA:  Altered mental status EXAM: PORTABLE CHEST 1 VIEW COMPARISON:  December 20, 2014 FINDINGS: Chronic right rib fractures. Cardiomediastinal silhouette is normal. The left lung is clear. Possible mild opacity in the right upper lung. No other acute abnormalities. IMPRESSION: Possible opacity in the right upper lung versus  confluence of shadows. Recommend a PA and lateral chest x-ray for better evaluation. Electronically Signed   By: Dorise Bullion III M.D   On: 09/20/2017 00:19   Dg Abd Portable 1 View  Result Date: 09/20/2017 CLINICAL DATA:  Nasogastric tube placement. EXAM: PORTABLE ABDOMEN - 1 VIEW COMPARISON:  CT of the abdomen and pelvis from 10/16/2014 FINDINGS: There is diffuse distention of small-bowel loops to 5.5 cm in diameter, raising concern for high-grade small bowel obstruction. Residual stool is noted in the colon. The stomach is partially filled with air. The patient's enteric tube is noted ending overlying the body of the stomach, with the side port about the gastroesophageal junction. No acute osseous abnormalities are seen. No free intra-abdominal air is seen, though evaluation for free air is limited on a single supine view. A right femoral line is noted. IMPRESSION: 1. Diffuse distention of small-bowel loops to 5.5 cm in diameter, raising concern for high-grade small bowel obstruction. No free intra-abdominal air seen. 2. Enteric tube noted ending overlying the body of the stomach, with the side port about the gastroesophageal junction. These results were called by telephone at the time of interpretation on 09/20/2017 at 1:12 am to Dr. Joseph Berkshire, who verbally acknowledged these results. Electronically Signed   By: Garald Balding M.D.   On: 09/20/2017 01:13    Procedures Procedures (including critical care time)  Medications Ordered in ED Medications  iopamidol (ISOVUE-300) 61 % injection 100 mL (has no administration in time range)  acetaminophen (TYLENOL) suppository 650 mg (has no administration in time range)  sodium chloride 0.9 % bolus 1,000 mL (0 mLs Intravenous Stopped 09/20/17 0239)  lidocaine (XYLOCAINE) 2 % (with pres) injection (  Given by Other 09/20/17 0239)  piperacillin-tazobactam (ZOSYN) IVPB 3.375 g (0 g Intravenous Stopped 09/20/17 0214)  vancomycin (VANCOCIN) 1,500 mg  in sodium chloride 0.9 % 500 mL IVPB (1,500 mg Intravenous New Bag/Given 09/20/17 0214)  sodium chloride 0.9 % bolus 1,000 mL (0 mLs Intravenous Stopped 09/20/17 0403)     Initial Impression / Assessment and Plan / ED Course  I have reviewed the triage vital signs and the nursing notes.  Pertinent labs & imaging results that were available during my care of the patient were reviewed by me and considered in my medical decision making (see chart for details).     Patient presents to the ER from nursing home.  Patient had been experiencing nausea and vomiting through the day, apparently a  nursing home had some concern about possible hematemesis.  An x-ray was performed earlier and there was concern for a small bowel obstruction, sent to the ER for further evaluation.  At arrival, patient appears agitated and uncomfortable, but could not answer any questions.  It is not clear what his normal baseline mental status is at this time.  No family has presented to the ER.  Patient had abdominal distention with decreased bowel sounds.  X-ray was consistent with bowel obstruction.  NG tube was placed.  CT scan performed, patient does have evidence of a high-grade obstruction.  There is also evidence of bilateral pneumonia.  This would be concerning for aspiration pneumonia.  Patient was febrile at arrival, initiated on broad-spectrum antibiotics.  Blood pressure was low as well, initiated on fluid resuscitation.  Blood pressure slightly improved.  Paperwork obtained from nursing home revealed that patient is currently in hospice and is a DNR.  Will admit to hospitalist service for further management.  CRITICAL CARE Performed by: Orpah Greek   Total critical care time: 30 minutes  Critical care time was exclusive of separately billable procedures and treating other patients.  Critical care was necessary to treat or prevent imminent or life-threatening deterioration.  Critical care was time spent  personally by me on the following activities: development of treatment plan with patient and/or surrogate as well as nursing, discussions with consultants, evaluation of patient's response to treatment, examination of patient, obtaining history from patient or surrogate, ordering and performing treatments and interventions, ordering and review of laboratory studies, ordering and review of radiographic studies, pulse oximetry and re-evaluation of patient's condition.   05 13 addendum - discussed with general surgery, Dr. Marcello Moores. Agrees with current plan, admit to Hospitalist. Nothing further at this time, surgery will see in AM.  Final Clinical Impressions(s) / ED Diagnoses   Final diagnoses:  Sepsis, due to unspecified organism ([place])  Aspiration pneumonia due to vomit, unspecified laterality, unspecified part of lung ([place])  SBO (small bowel obstruction) [facility])    ED Discharge Orders    None       Jakaria Lavergne, Gwenyth Allegra, MD 09/20/17 0448    Orpah Greek, MD 09/20/17 [phone]

## 2017-09-20 NOTE — Progress Notes (Signed)
Pharmacy Antibiotic Note  Kevin Humphrey is a 67 y.o. male admitted on 09/19/2017 with sepsis.  Pharmacy has been consulted for zosyn and vancomycin dosing.  Plan: Zosyn 3.375g IV q8h (4 hour infusion).  Adjust Vancomycin to 750mg  IV q24h for est AUC = 409 Check vancomycin levels at steady state, goal AUC = 400-500 Daily scr F/u cultures/levels  Height: 5\' 11"  (180.3 cm) Weight: 160 lb 4.4 oz (72.7 kg) IBW/kg (Calculated) : 75.3  Temp (24hrs), Avg:99.2 F (37.3 C), Min:97.7 F (36.5 C), Max:101 F (38.3 C)  Recent Labs  Lab 09/19/17 2324 09/19/17 2341 09/20/17 0426 09/20/17 0642  WBC 10.0  --   --  10.7*  CREATININE 1.65* 1.60*  --  1.82*  LATICACIDVEN  --  2.40* 1.79 2.6*    Estimated Creatinine Clearance: 39.9 mL/min (A) (by C-G formula based on SCr of 1.82 mg/dL (H)).    No Known Allergies  Antimicrobials this admission: 5/30 zosyn >>  5/30 vancomycin >>   Dose adjustments this admission:  Microbiology results: 5/30 BCx: 5/30 UCx:  Thank you for allowing pharmacy to be a part of this patient's care.  Peggyann Juba, PharmD, BCPS Pager: [phone] 09/20/2017 9:28 AM

## 2017-09-20 NOTE — H&P (Signed)
History and Physical    Tahmir Kleckner BMW:[id number] DOB: [birth year] DOA: 09/19/2017  PCP: [name], MD  Patient coming from: [facility].  History obtained from ER physician as patient has dementia and confusion.  No family at the bedside.  Chief Complaint: Nausea vomiting.  HPI: Bunny Lowdermilk is a 67 y.o. male with history of advanced dementia, schizophrenia, hypertension was brought from the skilled nursing facility after patient was found to have persistent nausea vomiting with hematemesis.  Chest x-ray done at this nursing facility was showing some infiltrates and abdomen x-ray was showing concerning for bowel obstruction.  ED Course: In the ER stool for occult blood was positive.  Abdomen was distended and initially patient on arrival was confused and minimally responsive but somewhat improved after starting antibiotics and fluids.  CT abdomen pelvis shows bowel obstruction with transition point.  Chest x-ray was showing possibility of infiltrates.  Patient was hypotensive improved with fluids and empiric antibiotic started for sepsis likely from aspiration.  Review of Systems: As per HPI, rest all negative.   Past Medical History:  Diagnosis Date  . Allergic rhinitis 08/02/2012  . Depression 08/02/2012  . Dysphagia, unspecified 08/02/2012  . GERD (gastroesophageal reflux disease) 08/02/2012  . Other and unspecified hyperlipidemia 08/02/2012  . Schizophrenia ([place]) 08/02/2012  . Vertebral compression fracture ([place]) 08/02/2012    History reviewed. No pertinent surgical history.   reports that he has quit smoking. He has never used smokeless tobacco. He reports that he drank alcohol. He reports that he has current or past drug history.  No Known Allergies  Family History  Family history unknown: Yes    Prior to Admission medications   Medication Sig Start Date End Date Taking? Authorizing Provider  acetaminophen (TYLENOL) 325 MG tablet Take 650 mg  by mouth 3 (three) times daily.    Yes [provider]  benztropine (COGENTIN) 1 MG tablet Take 1 mg by mouth 2 (two) times daily. 04/15/15  Yes [provider]  buPROPion (WELLBUTRIN) 75 MG tablet Take 150 mg by mouth daily.   Yes [provider]  Cholecalciferol (VITAMIN D3) 50000 units CAPS Take 50,000 Units by mouth every 30 (thirty) days. On the 15th   Yes [provider]  clonazePAM (KLONOPIN) 0.5 MG tablet Take 1 mg by mouth at bedtime.    Yes [provider]  divalproex (DEPAKOTE SPRINKLE) 125 MG capsule Take 250 mg by mouth 3 (three) times daily.   Yes [provider]  ferrous sulfate 325 (65 FE) MG tablet Take 325 mg by mouth daily.   Yes [provider]  lisinopril (PRINIVIL,ZESTRIL) 5 MG tablet Take 5 mg by mouth daily.   Yes [provider]  Multiple Vitamin (MULTIVITAMIN) tablet Take 1 tablet by mouth daily.   Yes [provider]  ondansetron (ZOFRAN) 4 MG tablet Take 4 mg by mouth every 8 (eight) hours as needed for nausea or vomiting.   Yes [provider]  Propylene Glycol (SYSTANE BALANCE) 0.6 % SOLN Apply 1 drop to eye 2 (two) times daily. For dry eyes   Yes [provider]  ranitidine (ZANTAC) 75 MG tablet Take 75 mg by mouth daily.   Yes [provider]  risperiDONE (RISPERDAL) 3 MG tablet Take 3 mg by mouth 2 (two) times daily.   Yes [provider]  traZODone (DESYREL) 100 MG tablet Take 100 mg by mouth at bedtime as needed for sleep.   Yes [provider]  LORazepam (ATIVAN) 1 MG tablet Take one tablet by mouth every 4 hours as needed for anxiety Patient not taking: Reported on 09/20/2017 03/08/15   Mariea Clonts, Tiffany L, DO  Morphine Sulfate (MORPHINE CONCENTRATE) 10 MG/0.5ML SOLN concentrated solution Take 0.25 mLs (5 mg total) by mouth every 2 (two) hours as needed for severe pain or shortness of breath. Patient not taking: Reported on 09/20/2017 12/25/14    Elgergawy, Silver Huguenin, MD    Physical Exam: Vitals:   09/20/17 0430 09/20/17 0431 09/20/17 0500 09/20/17 0530  BP: 95/71 95/71 97/75  99/74  Pulse: (!) 109 (!) 109 (!) 116 (!) 117  Resp: (!) 22 (!) 21 (!) 25 (!) 26  Temp:      TempSrc:      SpO2: 92% 92% 92% 92%      Constitutional: Moderately built and nourished. Vitals:   09/20/17 0430 09/20/17 0431 09/20/17 0500 09/20/17 0530  BP: 95/71 95/71 97/75  99/74  Pulse: (!) 109 (!) 109 (!) 116 (!) 117  Resp: (!) 22 (!) 21 (!) 25 (!) 26  Temp:      TempSrc:      SpO2: 92% 92% 92% 92%   Eyes: Anicteric no pallor. ENMT: No discharge from the ears eyes nose or mouth. Neck: No mass felt.  No neck rigidity. Respiratory: No rhonchi or crepitations. Cardiovascular: S1-S2 heard no murmurs appreciated. Abdomen: Distended bowel sounds not appreciated no guarding or rigidity. Musculoskeletal: No edema. Skin: No rash. Neurologic: Alert awake oriented to his name follows commands moves all extremities. Psychiatric: Oriented to his name.   Labs on Admission: I have personally reviewed following labs and imaging studies  CBC: Recent Labs  Lab 09/19/17 2324 09/19/17 2341  WBC 10.0  --   NEUTROABS 8.2*  --   HGB 15.6 16.3  HCT 44.8 48.0  MCV 92.0  --   PLT 420*  --    Basic Metabolic Panel: Recent Labs  Lab 09/19/17 2324 09/19/17 2341  NA 131* 130*  K 4.5 4.4  CL 90* 91*  CO2 25  --   GLUCOSE 173* 170*  BUN 25* 24*  CREATININE 1.65* 1.60*  CALCIUM 9.4  --    GFR: CrCl cannot be calculated (Unknown ideal weight.). Liver Function Tests: Recent Labs  Lab 09/19/17 2324  AST 24  ALT 19  ALKPHOS 66  BILITOT 0.6  PROT 7.9  ALBUMIN 4.1   Recent Labs  Lab 09/19/17 2324  LIPASE 19   No results for input(s): AMMONIA in the last 168 hours. Coagulation Profile: No results for input(s): INR, PROTIME in the last 168 hours. Cardiac Enzymes: Recent Labs  Lab 09/19/17 2324  TROPONINI <0.03   BNP (last 3 results) No  results for input(s): PROBNP in the last 8760 hours. HbA1C: No results for input(s): HGBA1C in the last 72 hours. CBG: No results for input(s): GLUCAP in the last 168 hours. Lipid Profile: No results for input(s): CHOL, HDL, LDLCALC, TRIG, CHOLHDL, LDLDIRECT in the last 72 hours. Thyroid Function Tests: No results for input(s): TSH, T4TOTAL, FREET4, T3FREE, THYROIDAB in the last 72 hours. Anemia Panel: No results for input(s): VITAMINB12, FOLATE, FERRITIN, TIBC, IRON, RETICCTPCT in the last 72 hours. Urine analysis:    Component Value Date/Time   COLORURINE YELLOW 09/20/2017 0128   APPEARANCEUR HAZY (A) 09/20/2017 0128   LABSPEC 1.017 09/20/2017 0128   PHURINE 6.0 09/20/2017 0128   GLUCOSEU NEGATIVE 09/20/2017 0128   HGBUR NEGATIVE 09/20/2017 0128   BILIRUBINUR NEGATIVE 09/20/2017 0128  KETONESUR NEGATIVE 09/20/2017 0128   PROTEINUR NEGATIVE 09/20/2017 0128   UROBILINOGEN 0.2 12/20/2014 1157   NITRITE NEGATIVE 09/20/2017 0128   LEUKOCYTESUR NEGATIVE 09/20/2017 0128   Sepsis Labs: @LABRCNTIP (procalcitonin:4,lacticidven:4) )No results found for this or any previous visit (from the past 240 hour(s)).   Radiological Exams on Admission: Ct Abdomen Pelvis Wo Contrast  Result Date: 09/20/2017 CLINICAL DATA:  Acute onset of generalized abdominal pain, nausea, vomiting and fever. EXAM: CT ABDOMEN AND PELVIS WITHOUT CONTRAST TECHNIQUE: Multidetector CT imaging of the abdomen and pelvis was performed following the standard protocol without IV contrast. COMPARISON:  CT of the abdomen and pelvis performed 10/16/2014 FINDINGS: Lower chest: Trace bilateral pleural effusions are noted. Patchy bibasilar airspace opacities may reflect pneumonia. Scattered coronary artery calcifications are seen. Hepatobiliary: The liver is unremarkable in appearance. The patient is status post cholecystectomy, with clips noted at the gallbladder fossa. The common bile duct remains normal in caliber. Pancreas: The  pancreas is within normal limits. Spleen: The spleen is unremarkable in appearance. Adrenals/Urinary Tract: The adrenal glands are unremarkable in appearance. Nonspecific perinephric stranding is noted bilaterally. There is no evidence of hydronephrosis. No renal or ureteral stones are identified. Scattered bilateral renal cysts are noted; an isodense cyst on the right is stable from 2016. Stomach/Bowel: The stomach is unremarkable in appearance. There is diffuse distention of small-bowel loops to 6.0 cm in maximal diameter, with a focal transition point is at the mid abdomen along the proximal ileum. There is complete decompression of more distal ileal loops. This is concerning for high-grade small bowel obstruction. Underlying mild mesenteric edema is noted. The appendix is normal in caliber, without evidence of appendicitis. The colon is unremarkable in appearance. Mild wall thickening at the rectum could reflect mild proctitis. Vascular/Lymphatic: Scattered calcification is seen along the abdominal aorta and its branches. The abdominal aorta is otherwise grossly unremarkable. The inferior vena cava is grossly unremarkable. No retroperitoneal lymphadenopathy is seen. No pelvic sidewall lymphadenopathy is identified. Reproductive: The bladder is decompressed and not well characterized. The prostate remains normal in size. Other: No additional soft tissue abnormalities are seen. A right femoral line is noted. Musculoskeletal: No acute osseous abnormalities are identified. Minimal chronic loss of height is noted at the lower thoracic spine. The visualized musculature is unremarkable in appearance. IMPRESSION: 1. Diffuse distention of small-bowel loops to 6.0 cm in maximal diameter, with a focal transition point at the mid abdomen along the proximal ileum. There is complete decompression of more distal ileal loops. This is concerning for high-grade small bowel obstruction. Underlying mild mesenteric edema noted. 2.  Trace bilateral pleural effusions. Patchy bibasilar airspace opacities may reflect pneumonia. 3. Mild wall thickening at the rectum could reflect mild proctitis. 4. Scattered coronary artery calcifications seen. 5. Scattered bilateral renal cysts noted. Aortic Atherosclerosis (ICD10-I70.0). Electronically Signed   By: Garald Balding M.D.   On: 09/20/2017 04:13   Dg Abdomen 1 View  Result Date: 09/20/2017 CLINICAL DATA:  67 y/o  M; NG tube positioning. EXAM: ABDOMEN - 1 VIEW COMPARISON:  09/20/2017 CT abdomen and pelvis. FINDINGS: Diffuse small bowel dilatation. Enteric tube tip projects over the distal stomach. Moderate degenerative changes of the lumbar spine with mild levocurvature. IMPRESSION: Enteric tube tip projects over distal stomach. Diffuse small bowel dilatation. Electronically Signed   By: Kristine Garbe M.D.   On: 09/20/2017 04:26   Dg Chest Port 1 View  Result Date: 09/20/2017 CLINICAL DATA:  Altered mental status EXAM: PORTABLE CHEST 1 VIEW COMPARISON:  December 20, 2014 FINDINGS: Chronic right rib fractures. Cardiomediastinal silhouette is normal. The left lung is clear. Possible mild opacity in the right upper lung. No other acute abnormalities. IMPRESSION: Possible opacity in the right upper lung versus confluence of shadows. Recommend a PA and lateral chest x-ray for better evaluation. Electronically Signed   By: Dorise Bullion III M.D   On: 09/20/2017 00:19   Dg Abd Portable 1 View  Result Date: 09/20/2017 CLINICAL DATA:  Nasogastric tube placement. EXAM: PORTABLE ABDOMEN - 1 VIEW COMPARISON:  CT of the abdomen and pelvis from 10/16/2014 FINDINGS: There is diffuse distention of small-bowel loops to 5.5 cm in diameter, raising concern for high-grade small bowel obstruction. Residual stool is noted in the colon. The stomach is partially filled with air. The patient's enteric tube is noted ending overlying the body of the stomach, with the side port about the gastroesophageal  junction. No acute osseous abnormalities are seen. No free intra-abdominal air is seen, though evaluation for free air is limited on a single supine view. A right femoral line is noted. IMPRESSION: 1. Diffuse distention of small-bowel loops to 5.5 cm in diameter, raising concern for high-grade small bowel obstruction. No free intra-abdominal air seen. 2. Enteric tube noted ending overlying the body of the stomach, with the side port about the gastroesophageal junction. These results were called by telephone at the time of interpretation on 09/20/2017 at 1:12 am to Dr. Joseph Berkshire, who verbally acknowledged these results. Electronically Signed   By: Garald Balding M.D.   On: 09/20/2017 01:13    EKG: Independently reviewed.  Sinus tachycardia with lateral wall ST-T changes.  Assessment/Plan Principal Problem:   SBO (small bowel obstruction) (HCC) Active Problems:   Schizophrenia ([place])   Malnutrition of moderate degree (HCC)   ARF (acute renal failure) ([place])   Hematemesis   Sepsis ([place])    1. Small bowel obstruction -General surgery has been consulted patient is placed on NG tube suction.  Continue with IV fluids keep patient n.p.o.  Await general surgery recommendations. 2. Sepsis likely from aspiration -patient on empiric antibiotics follow cultures continue with hydration follow lactate pro calcitonin levels.  Hold antihypertensives for now. 3. Hematemesis -follow CBC.  Will keep patient on Protonix. 4. Acute renal failure likely from nausea and vomiting.  Continue hydration follow metabolic panel.  Hold lisinopril. 5. History of hypertension presently septic holding antihypertensives will keep patient on PRN IV hydralazine. 6. History of schizophrenia presently n.p.o.  I have dosed Depakote as IV.  Follow Depakote levels.  Patient may need PRN IV Haldol if gets agitated. 7. Hyperglycemia we will check hemoglobin A1c.  As per the discussion with the nursing facilities nurse patient is  a DNR.   DVT prophylaxis: SCDs in anticipation of procedure. Code Status: DNR. Family Communication: No family at the bedside. Disposition Plan: Back to facility. Consults called: General surgery. Admission status: Inpatient.   Rise Patience MD [facility] Pager [phone].  If 7PM-7AM, please contact night-coverage [email or web address] Password [email or web address]  09/20/2017, 6:06 AM

## 2017-09-20 NOTE — ED Notes (Signed)
Called [facility] spoke with nurse for DNR and hospice paper work  Waiting fax of paper work.  Asked if pt had ever been seen at any other hospital? Asked if pt has ever had a blood transfusion? Asked if pt had a past bone marrow transplant? Lab wanted these answers because of presence of antibodies.

## 2017-09-20 NOTE — Progress Notes (Signed)
A consult was received from an ED physician for zosyn and vancomycin per pharmacy dosing.  The patient's profile has been reviewed for ht/wt/allergies/indication/available labs.   A one time order has been placed for Zosyn 3.375 gm and Vancomycin 1500 mg.  Further antibiotics/pharmacy consults should be ordered by admitting physician if indicated.                       Thank you, Dorrene German 09/20/2017  1:01 AM

## 2017-09-20 NOTE — Progress Notes (Signed)
Pharmacy Antibiotic Note  Kevin Humphrey is a 67 y.o. male admitted on 09/19/2017 with sepsis.  Pharmacy has been consulted for zosyn and vancomycin dosing.  Plan: Zosyn 3.375g IV q8h (4 hour infusion).  Vancomycin 1500 mg x1 then 1 Gm IV q24h for est AUC = 430 Goal AUC = 400-500 Daily scr F/u cultures/levels     Temp (24hrs), Avg:99.4 F (37.4 C), Min:97.7 F (36.5 C), Max:101 F (38.3 C)  Recent Labs  Lab 09/19/17 2324 09/19/17 2341 09/20/17 0426  WBC 10.0  --   --   CREATININE 1.65* 1.60*  --   LATICACIDVEN  --  2.40* 1.79    CrCl cannot be calculated (Unknown ideal weight.).    No Known Allergies  Antimicrobials this admission: 5/30 zosyn >>  5/30 vancomycin >>   Dose adjustments this admission:   Microbiology results:  BCx:   UCx:    Sputum:    MRSA PCR:   Thank you for allowing pharmacy to be a part of this patient's care.  Dorrene German 09/20/2017 6:13 AM

## 2017-09-20 NOTE — Progress Notes (Signed)
I have seen and assessed patient and agree with Dr. Moise Boring assessment and plan.  Patient is a 67 year old gentleman history of advanced dementia, schizophrenia, hypertension brought from skilled nursing facility secondary to nausea vomiting hematemesis.  Chest x-ray done concerning for infiltrate abdominal x-ray concerning for small bowel obstruction. Patient is also noted to be in acute renal failure.  Patient also noted to be hypotensive.  Patient was admitted placed in the stepdown unit and noted to be septic felt likely secondary to probable pneumonia.  CT abdomen and pelvis which was done showed small bowel obstruction with a transition point.  NG tube placed.  Patient pancultured.  Patient placed empirically on IV antibiotics.  General surgical consultation placed and patient seen in consultation by general surgery.  We will give a bolus of normal saline 1 L x 1 and continue normal saline at 125 cc an hour.  Check a fractional excretion of sodium.  Continue empiric IV antibiotics.  Replete electrolytes keep magnesium greater than 2 and potassium greater than 4.  Supportive care.  No charge.

## 2017-09-20 NOTE — Progress Notes (Signed)
Spoke earlier today with Clemens Catholic, NP from [facility]. NP stated that patient had been in the hospital earlier this month due to a fall and she thought that patient was seen at [facility] but was not sure. NP sent over comprehensive physical exam from last time that patient was seen (February 2019). MD made aware that information was faxed over here and placed in chart. RN asked NP questions that lab wanted to ask in regards to previous blood transfusion or bone marrow transplant but NP did not know the answers. Lab made aware.  NP also stated that if there were any other questions that she could be reached at [phone]. Will continue to monitor.

## 2017-09-20 NOTE — ED Notes (Signed)
ED TO INPATIENT HANDOFF REPORT  Name/Age/Gender Kevin Humphrey 67 y.o. male  Code Status    Code Status Orders  (From admission, onward)        Start     Ordered   09/20/17 0605  Do not attempt resuscitation (DNR)  Continuous    Question Answer Comment  In the event of cardiac or respiratory ARREST Do not call a "code blue"   In the event of cardiac or respiratory ARREST Do not perform Intubation, CPR, defibrillation or ACLS   In the event of cardiac or respiratory ARREST Use medication by any route, position, wound care, and other measures to relive pain and suffering. May use oxygen, suction and manual treatment of airway obstruction as needed for comfort.      09/20/17 5784    Code Status History    Date Active Date Inactive Code Status Order ID Comments User Context   12/25/2014 0921 12/26/2014 1321 DNR [id number]  Pershing Proud, NP Inpatient   12/12/2014 0514 12/25/2014 0921 Full Code [id number]  Luz Brazen, MD ED      Home/SNF/Other Nursing Home  Chief Complaint Hematemesis  Level of Care/Admitting Diagnosis ED Disposition    ED Disposition Condition [place]: [facility] [[id number]]  Level of Care: Stepdown [14]  Admit to SDU based on following criteria: Hemodynamic compromise or significant risk of instability:  Patient requiring short term acute titration and management of vasoactive drips, and invasive monitoring (i.e., CVP and Arterial line).  Diagnosis: SBO (small bowel obstruction) [facility]) [[id number]]  Admitting Physician: Rise Patience [phone]  Attending Physician: Rise Patience [phone]  Estimated length of stay: past midnight tomorrow  Certification:: I certify this patient will need inpatient services for at least 2 midnights  PT Class (Do Not Modify): Inpatient [101]  PT Acc Code (Do Not Modify): Private [1]       Medical History Past Medical History:  Diagnosis Date  . Allergic rhinitis 08/02/2012  .  Depression 08/02/2012  . Dysphagia, unspecified 08/02/2012  . GERD (gastroesophageal reflux disease) 08/02/2012  . Other and unspecified hyperlipidemia 08/02/2012  . Schizophrenia ([place]) 08/02/2012  . Vertebral compression fracture ([place]) 08/02/2012    Allergies No Known Allergies  IV Location/Drains/Wounds Patient Lines/Drains/Airways Status   Active Line/Drains/Airways    Name:   Placement date:   Placement time:   Site:   Days:   CVC Triple Lumen 09/19/17 Right Femoral   09/19/17    2350     1   NG/OG Tube Nasogastric 16 Fr. Left nare Xray;Aucultation Measured external length of tube   09/20/17    0015    Left nare   less than 1          Labs/Imaging Results for orders placed or performed during the hospital encounter of 09/19/17 (from the past 48 hour(s))  Lipase, blood     Status: None   Collection Time: 09/19/17 11:24 PM  Result Value Ref Range   Lipase 19 11 - 51 U/L    Comment: Performed at [facility], [place] [address] [place], [place] [address]  Comprehensive metabolic panel     Status: Abnormal   Collection Time: 09/19/17 11:24 PM  Result Value Ref Range   Sodium 131 (L) 135 - 145 mmol/L   Potassium 4.5 3.5 - 5.1 mmol/L   Chloride 90 (L) 101 - 111 mmol/L   CO2 25 22 - 32 mmol/L   Glucose, Bld 173 (  H) 65 - 99 mg/dL   BUN 25 (H) 6 - 20 mg/dL   Creatinine, Ser 1.65 (H) 0.61 - 1.24 mg/dL   Calcium 9.4 8.9 - 10.3 mg/dL   Total Protein 7.9 6.5 - 8.1 g/dL   Albumin 4.1 3.5 - 5.0 g/dL   AST 24 15 - 41 U/L   ALT 19 17 - 63 U/L   Alkaline Phosphatase 66 38 - 126 U/L   Total Bilirubin 0.6 0.3 - 1.2 mg/dL   GFR calc non Af Amer 41 (L) >60 mL/min   GFR calc Af Amer 48 (L) >60 mL/min    Comment: (NOTE) The eGFR has been calculated using the CKD EPI equation. This calculation has not been validated in all clinical situations. eGFR's persistently <60 mL/min signify possible Chronic Kidney Disease.    Anion gap 16 (H) 5 - 15    Comment: Performed at [facility], [place] [address], [place], [place] [address]  Brain natriuretic peptide     Status: None   Collection Time: 09/19/17 11:24 PM  Result Value Ref Range   B Natriuretic Peptide 39.9 0.0 - 100.0 pg/mL    Comment: Performed at [facility], [place] [address] [place], [place] [address]  Troponin I     Status: None   Collection Time: 09/19/17 11:24 PM  Result Value Ref Range   Troponin I <0.03 <0.03 ng/mL    Comment: Performed at [facility], [place] [address], [place], [place] [address]  CBC with Differential     Status: Abnormal   Collection Time: 09/19/17 11:24 PM  Result Value Ref Range   WBC 10.0 4.0 - 10.5 K/uL   RBC 4.87 4.22 - 5.81 MIL/uL   Hemoglobin 15.6 13.0 - 17.0 g/dL   HCT 44.8 39.0 - 52.0 %   MCV 92.0 78.0 - 100.0 fL   MCH 32.0 26.0 - 34.0 pg   MCHC 34.8 30.0 - 36.0 g/dL   RDW 13.5 11.5 - 15.5 %   Platelets 420 (H) 150 - 400 K/uL   Neutrophils Relative % 82 %   Neutro Abs 8.2 (H) 1.7 - 7.7 K/uL   Lymphocytes Relative 10 %   Lymphs Abs 1.0 0.7 - 4.0 K/uL   Monocytes Relative 8 %   Monocytes Absolute 0.8 0.1 - 1.0 K/uL   Eosinophils Relative 0 %   Eosinophils Absolute 0.0 0.0 - 0.7 K/uL   Basophils Relative 0 %   Basophils Absolute 0.0 0.0 - 0.1 K/uL    Comment: Performed at [facility], [place] [address], [place], [place] [address]  Type and screen [place]     Status: None (Preliminary result)   Collection Time: 09/19/17 11:33 PM  Result Value Ref Range   ABO/RH(D) O POS    Antibody Screen POS    Sample Expiration      09/22/2017 Performed at [facility], [place] [address], [place], [place] [address]    Antibody Identification PENDING   I-Stat CG4 Lactic Acid, ED     Status: Abnormal   Collection Time: 09/19/17 11:41 PM  Result Value Ref Range   Lactic Acid, Venous 2.40 (HH) 0.5 - 1.9 mmol/L   Comment NOTIFIED PHYSICIAN   I-Stat Chem 8, ED      Status: Abnormal   Collection Time: 09/19/17 11:41 PM  Result Value Ref Range   Sodium 130 (L) 135 - 145 mmol/L   Potassium 4.4 3.5 - 5.1  mmol/L   Chloride 91 (L) 101 - 111 mmol/L   BUN 24 (H) 6 - 20 mg/dL   Creatinine, Ser 1.60 (H) 0.61 - 1.24 mg/dL   Glucose, Bld 170 (H) 65 - 99 mg/dL   Calcium, Ion 1.11 (L) 1.15 - 1.40 mmol/L   TCO2 27 22 - 32 mmol/L   Hemoglobin 16.3 13.0 - 17.0 g/dL   HCT 48.0 39.0 - 52.0 %  Urinalysis, Routine w reflex microscopic     Status: Abnormal   Collection Time: 09/20/17  1:28 AM  Result Value Ref Range   Color, Urine YELLOW YELLOW   APPearance HAZY (A) CLEAR   Specific Gravity, Urine 1.017 1.005 - 1.030   pH 6.0 5.0 - 8.0   Glucose, UA NEGATIVE NEGATIVE mg/dL   Hgb urine dipstick NEGATIVE NEGATIVE   Bilirubin Urine NEGATIVE NEGATIVE   Ketones, ur NEGATIVE NEGATIVE mg/dL   Protein, ur NEGATIVE NEGATIVE mg/dL   Nitrite NEGATIVE NEGATIVE   Leukocytes, UA NEGATIVE NEGATIVE    Comment: Performed at [facility], [place] [address], [place], [place] [address]  POC occult blood, ED     Status: Abnormal   Collection Time: 09/20/17  1:42 AM  Result Value Ref Range   Fecal Occult Bld POSITIVE (A) NEGATIVE  I-Stat CG4 Lactic Acid, ED     Status: None   Collection Time: 09/20/17  4:26 AM  Result Value Ref Range   Lactic Acid, Venous 1.79 0.5 - 1.9 mmol/L   Ct Abdomen Pelvis Wo Contrast  Result Date: 09/20/2017 CLINICAL DATA:  Acute onset of generalized abdominal pain, nausea, vomiting and fever. EXAM: CT ABDOMEN AND PELVIS WITHOUT CONTRAST TECHNIQUE: Multidetector CT imaging of the abdomen and pelvis was performed following the standard protocol without IV contrast. COMPARISON:  CT of the abdomen and pelvis performed 10/16/2014 FINDINGS: Lower chest: Trace bilateral pleural effusions are noted. Patchy bibasilar airspace opacities may reflect pneumonia. Scattered coronary artery calcifications are seen. Hepatobiliary: The liver is unremarkable  in appearance. The patient is status post cholecystectomy, with clips noted at the gallbladder fossa. The common bile duct remains normal in caliber. Pancreas: The pancreas is within normal limits. Spleen: The spleen is unremarkable in appearance. Adrenals/Urinary Tract: The adrenal glands are unremarkable in appearance. Nonspecific perinephric stranding is noted bilaterally. There is no evidence of hydronephrosis. No renal or ureteral stones are identified. Scattered bilateral renal cysts are noted; an isodense cyst on the right is stable from 2016. Stomach/Bowel: The stomach is unremarkable in appearance. There is diffuse distention of small-bowel loops to 6.0 cm in maximal diameter, with a focal transition point is at the mid abdomen along the proximal ileum. There is complete decompression of more distal ileal loops. This is concerning for high-grade small bowel obstruction. Underlying mild mesenteric edema is noted. The appendix is normal in caliber, without evidence of appendicitis. The colon is unremarkable in appearance. Mild wall thickening at the rectum could reflect mild proctitis. Vascular/Lymphatic: Scattered calcification is seen along the abdominal aorta and its branches. The abdominal aorta is otherwise grossly unremarkable. The inferior vena cava is grossly unremarkable. No retroperitoneal lymphadenopathy is seen. No pelvic sidewall lymphadenopathy is identified. Reproductive: The bladder is decompressed and not well characterized. The prostate remains normal in size. Other: No additional soft tissue abnormalities are seen. A right femoral line is noted. Musculoskeletal: No acute osseous abnormalities are identified. Minimal chronic loss of height is noted at the lower thoracic spine. The visualized musculature is unremarkable in appearance. IMPRESSION: 1.  Diffuse distention of small-bowel loops to 6.0 cm in maximal diameter, with a focal transition point at the mid abdomen along the proximal ileum.  There is complete decompression of more distal ileal loops. This is concerning for high-grade small bowel obstruction. Underlying mild mesenteric edema noted. 2. Trace bilateral pleural effusions. Patchy bibasilar airspace opacities may reflect pneumonia. 3. Mild wall thickening at the rectum could reflect mild proctitis. 4. Scattered coronary artery calcifications seen. 5. Scattered bilateral renal cysts noted. Aortic Atherosclerosis (ICD10-I70.0). Electronically Signed   By: Garald Balding M.D.   On: 09/20/2017 04:13   Dg Abdomen 1 View  Result Date: 09/20/2017 CLINICAL DATA:  67 y/o  M; NG tube positioning. EXAM: ABDOMEN - 1 VIEW COMPARISON:  09/20/2017 CT abdomen and pelvis. FINDINGS: Diffuse small bowel dilatation. Enteric tube tip projects over the distal stomach. Moderate degenerative changes of the lumbar spine with mild levocurvature. IMPRESSION: Enteric tube tip projects over distal stomach. Diffuse small bowel dilatation. Electronically Signed   By: Kristine Garbe M.D.   On: 09/20/2017 04:26   Dg Chest Port 1 View  Result Date: 09/20/2017 CLINICAL DATA:  Altered mental status EXAM: PORTABLE CHEST 1 VIEW COMPARISON:  December 20, 2014 FINDINGS: Chronic right rib fractures. Cardiomediastinal silhouette is normal. The left lung is clear. Possible mild opacity in the right upper lung. No other acute abnormalities. IMPRESSION: Possible opacity in the right upper lung versus confluence of shadows. Recommend a PA and lateral chest x-ray for better evaluation. Electronically Signed   By: Dorise Bullion III M.D   On: 09/20/2017 00:19   Dg Abd Portable 1 View  Result Date: 09/20/2017 CLINICAL DATA:  Nasogastric tube placement. EXAM: PORTABLE ABDOMEN - 1 VIEW COMPARISON:  CT of the abdomen and pelvis from 10/16/2014 FINDINGS: There is diffuse distention of small-bowel loops to 5.5 cm in diameter, raising concern for high-grade small bowel obstruction. Residual stool is noted in the colon. The  stomach is partially filled with air. The patient's enteric tube is noted ending overlying the body of the stomach, with the side port about the gastroesophageal junction. No acute osseous abnormalities are seen. No free intra-abdominal air is seen, though evaluation for free air is limited on a single supine view. A right femoral line is noted. IMPRESSION: 1. Diffuse distention of small-bowel loops to 5.5 cm in diameter, raising concern for high-grade small bowel obstruction. No free intra-abdominal air seen. 2. Enteric tube noted ending overlying the body of the stomach, with the side port about the gastroesophageal junction. These results were called by telephone at the time of interpretation on 09/20/2017 at 1:12 am to Dr. Joseph Berkshire, who verbally acknowledged these results. Electronically Signed   By: Garald Balding M.D.   On: 09/20/2017 01:13    Pending Labs Unresulted Labs (From admission, onward)   Start     Ordered   09/21/17 0500  Creatinine, serum  Daily,   R     09/20/17 0612   09/20/17 0604  CBC with Differential  STAT,   R     09/20/17 0605   09/20/17 0604  Comprehensive metabolic panel  STAT,   R     09/20/17 0605   09/20/17 0604  Lactic acid, plasma  STAT Now then every 3 hours,   STAT     09/20/17 0605   09/20/17 0604  Procalcitonin  STAT,   R     09/20/17 0605   09/20/17 0604  Protime-INR  STAT,   R  09/20/17 0605   09/20/17 0604  APTT  STAT,   R     09/20/17 0605   09/20/17 0604  HIV antibody (Routine Testing)  Once,   R     09/20/17 0605   09/20/17 0447  Ammonia  STAT,   R     09/20/17 0446   09/20/17 0447  Protime-INR  STAT,   R     09/20/17 0446   09/20/17 0047  Blood Culture (routine x 2)  BLOOD CULTURE X 2,   STAT     09/20/17 0047   09/20/17 0047  Urine culture  STAT,   STAT     09/20/17 0047      Vitals/Pain Today's Vitals   09/20/17 0430 09/20/17 0431 09/20/17 0500 09/20/17 0530  BP: _0 99/74  Pulse: (!) 109 (!) 109 (!) 116 (!)  117  Resp: (!) 22 (!) 21 (!) 25 (!) 26  Temp:      TempSrc:      SpO2: 92% 92% 92% 92%    Isolation Precautions No active isolations  Medications Medications  acetaminophen (TYLENOL) tablet 650 mg (has no administration in time range)    Or  acetaminophen (TYLENOL) suppository 650 mg (has no administration in time range)  ondansetron (ZOFRAN) tablet 4 mg (has no administration in time range)    Or  ondansetron (ZOFRAN) injection 4 mg (has no administration in time range)  0.9 %  sodium chloride infusion ( Intravenous New Bag/Given 09/20/17 0615)  hydrALAZINE (APRESOLINE) injection 10 mg (has no administration in time range)  piperacillin-tazobactam (ZOSYN) IVPB 3.375 g (has no administration in time range)  sodium chloride 0.9 % bolus 1,000 mL (0 mLs Intravenous Stopped 09/20/17 0239)  lidocaine (XYLOCAINE) 2 % (with pres) injection (  Given by Other 09/20/17 0239)  piperacillin-tazobactam (ZOSYN) IVPB 3.375 g (0 g Intravenous Stopped 09/20/17 0214)  vancomycin (VANCOCIN) 1,500 mg in sodium chloride 0.9 % 500 mL IVPB (0 mg Intravenous Stopped 09/20/17 0453)  sodium chloride 0.9 % bolus 1,000 mL (0 mLs Intravenous Stopped 09/20/17 0403)    Mobility non-ambulatory

## 2017-09-20 NOTE — ED Notes (Signed)
Nurse at [facility] is wanda [phone] 24 hr hospice line called [phone] for more information for labs blood research questions.

## 2017-09-20 NOTE — Progress Notes (Signed)
CRITICAL VALUE ALERT  Critical Value:  Lactic Acid 2.6, troponin 0.03 Date & Time Notied:  09/20/17, 0740  Provider Notified: Dr. Grandville Silos  Orders Received/Actions taken: new orders received.

## 2017-09-20 NOTE — Progress Notes (Addendum)
MD made aware that patient had not had any urine output today and RN unable to collect creatinine and sodium urine sample. Patient was bladder scanned, volume was 148 mL. New orders from MD to administer 1 L normal saline bolus and if no urine output after 2 hours to perform in/out cath. Will continue to monitor.

## 2017-09-20 NOTE — Progress Notes (Signed)
Pharmacist-Provider Communication:  Order for famotidine 20 mg IV q12h changed to q24h due to renal function. Per protocol.  Lenis Noon, PharmD 09/20/17 10:17 AM

## 2017-09-21 ENCOUNTER — Inpatient Hospital Stay (HOSPITAL_COMMUNITY): Payer: Medicare Other

## 2017-09-21 ENCOUNTER — Encounter (HOSPITAL_COMMUNITY): Payer: Self-pay | Admitting: Surgery

## 2017-09-21 DIAGNOSIS — K56609 Unspecified intestinal obstruction, unspecified as to partial versus complete obstruction: Secondary | ICD-10-CM

## 2017-09-21 DIAGNOSIS — F411 Generalized anxiety disorder: Secondary | ICD-10-CM

## 2017-09-21 LAB — GLUCOSE, CAPILLARY
GLUCOSE-CAPILLARY: 99 mg/dL (ref 65–99)
Glucose-Capillary: 134 mg/dL — ABNORMAL HIGH (ref 65–99)
Glucose-Capillary: 94 mg/dL (ref 65–99)
Glucose-Capillary: 120 mg/dL — ABNORMAL HIGH (ref 65–99)

## 2017-09-21 LAB — BASIC METABOLIC PANEL
Anion gap: 5 (ref 5–15)
BUN: 26 mg/dL — AB (ref 6–20)
CALCIUM: 7.8 mg/dL — AB (ref 8.9–10.3)
CO2: 27 mmol/L (ref 22–32)
CREATININE: 1.11 mg/dL (ref 0.61–1.24)
Chloride: 108 mmol/L (ref 101–111)
GFR calc Af Amer: >60 mL/min (ref >60)
GLUCOSE: 109 mg/dL — AB (ref 65–99)
Potassium: 3.9 mmol/L (ref 3.5–5.1)
Sodium: 140 mmol/L (ref 135–145)

## 2017-09-21 LAB — CBC WITH DIFFERENTIAL/PLATELET
Basophils Absolute: 0 10*3/uL (ref 0.0–0.1)
Basophils Relative: 0 %
EOS ABS: 0 10*3/uL (ref 0.0–0.7)
EOS PCT: 0 %
HCT: 29.3 % — ABNORMAL LOW (ref 39.0–52.0)
Hemoglobin: 9.8 g/dL — ABNORMAL LOW (ref 13.0–17.0)
LYMPHS ABS: 1.2 10*3/uL (ref 0.7–4.0)
LYMPHS PCT: 10 %
MCH: 31 pg (ref 26.0–34.0)
MCHC: 33.4 g/dL (ref 30.0–36.0)
MCV: 92.7 fL (ref 78.0–100.0)
MONO ABS: 1.2 10*3/uL — AB (ref 0.1–1.0)
Monocytes Relative: 10 %
Neutro Abs: 9.6 10*3/uL — ABNORMAL HIGH (ref 1.7–7.7)
Neutrophils Relative %: 80 %
PLATELETS: 241 10*3/uL (ref 150–400)
RBC: 3.16 MIL/uL — ABNORMAL LOW (ref 4.22–5.81)
RDW: 14.2 % (ref 11.5–15.5)
WBC: 12 10*3/uL — AB (ref 4.0–10.5)

## 2017-09-21 LAB — IRON AND TIBC
Iron: 11 ug/dL — ABNORMAL LOW (ref 45–182)
SATURATION RATIOS: 6 % — AB (ref 17.9–39.5)
TIBC: 196 ug/dL — AB (ref 250–450)
UIBC: 185 ug/dL

## 2017-09-21 LAB — MAGNESIUM: Magnesium: 2.5 mg/dL — ABNORMAL HIGH (ref 1.7–2.4)

## 2017-09-21 LAB — PROCALCITONIN: Procalcitonin: 3.06 ng/mL

## 2017-09-21 LAB — URINE CULTURE

## 2017-09-21 LAB — VITAMIN B12: VITAMIN B 12: 282 pg/mL (ref 180–914)

## 2017-09-21 LAB — FOLATE: Folate: 24.3 ng/mL (ref >5.9)

## 2017-09-21 LAB — LACTIC ACID, PLASMA: LACTIC ACID, VENOUS: 1 mmol/L (ref 0.5–1.9)

## 2017-09-21 LAB — FERRITIN: FERRITIN: 142 ng/mL (ref 24–336)

## 2017-09-21 MED ORDER — VANCOMYCIN HCL IN DEXTROSE 750-5 MG/150ML-% IV SOLN
750.0000 mg | Freq: Two times a day (BID) | INTRAVENOUS | Status: DC
  Administered 2017-09-21 – 2017-09-22 (×2): 750 mg via INTRAVENOUS
  Filled 2017-09-21 (×2): qty 150

## 2017-09-21 MED ORDER — CYANOCOBALAMIN 1000 MCG/ML IJ SOLN
1000.0000 ug | Freq: Every day | INTRAMUSCULAR | Status: AC
  Administered 2017-09-21 – 2017-09-27 (×6): 1000 ug via INTRAMUSCULAR
  Filled 2017-09-21 (×7): qty 1

## 2017-09-21 MED ORDER — FAMOTIDINE IN NACL 20-0.9 MG/50ML-% IV SOLN
20.0000 mg | Freq: Two times a day (BID) | INTRAVENOUS | Status: DC
  Administered 2017-09-21 – 2017-10-10 (×37): 20 mg via INTRAVENOUS
  Filled 2017-09-21 (×38): qty 50

## 2017-09-21 NOTE — Progress Notes (Addendum)
CSW consult- Patient from [facility]  CSW called and left voicemail for patient Kevin Humphrey with the department of social services, to discuss patient discharge plan.  CSW called to speak with admitting or floor nurse at [facility] for collateral information about patient care. Unable to reach anyone after 3x's calling.   CSW will continue to follow this patient.   Kathrin Greathouse, Latanya Presser, MSW Clinical Social Worker  [phone] 09/21/2017  2:17 PM

## 2017-09-21 NOTE — Plan of Care (Signed)
  Problem: Nutrition: Goal: Adequate nutrition will be maintained Outcome: Progressing   Problem: Elimination: Goal: Will not experience complications related to bowel motility Outcome: Progressing   Problem: Safety: Goal: Ability to remain free from injury will improve Outcome: Progressing   Problem: Pain Managment: Goal: General experience of comfort will improve Outcome: Progressing

## 2017-09-21 NOTE — Progress Notes (Signed)
PROGRESS NOTE    Demarco Bacci  IWL:[id number] DOB: 10-24-1950 DOA: 09/19/2017 PCP: [name], MD    Brief Narrative:  Wilgus Deyton is a 67 y.o. male with history of advanced dementia, schizophrenia, hypertension was brought from the skilled nursing facility after patient was found to have persistent nausea vomiting with hematemesis.  Chest x-ray done at this nursing facility was showing some infiltrates and abdomen x-ray was showing concerning for bowel obstruction.  ED Course: In the ER stool for occult blood was positive.  Abdomen was distended and initially patient on arrival was confused and minimally responsive but somewhat improved after starting antibiotics and fluids.  CT abdomen pelvis shows bowel obstruction with transition point.  Chest x-ray was showing possibility of infiltrates.  Patient was hypotensive improved with fluids and empiric antibiotic started for sepsis likely from aspiration     Assessment & Plan:   Principal Problem:   SBO (small bowel obstruction) (HCC) Active Problems:   Schizophrenia (HCC)   Malnutrition of moderate degree (HCC)   ARF (acute renal failure) (HCC)   Hematemesis   Sepsis ([place])   Anxiety state  #1 high-grade small bowel obstruction Questionable etiology.  No flatus.  No bowel movement.  Abdomen distended with no bowel sounds.  NG tube in place with output of 2.450 L over the past 24 hours.  She is being followed by general surgery.  Serial abdominal films.  If likely no significant improvement in the next 24 hours may need surgery.  Replete electrolytes.  IV fluids.  Supportive care.  Per general surgery.  2.  Acute renal failure Likely secondary to prerenal azotemia in the setting of ACE inhibitor.  Patient noted on admission to be hypotensive.  Patient hydrated aggressively with IV fluids.  Renal function improving and trending down.  Urine output picking up.  Continue to hold ACE inhibitor.  Avoid nephrotoxins.  Decrease  IV fluids to 75 cc/h.  Follow.  3.  Sepsis likely secondary to aspiration pneumonia Per CT abdomen and pelvis as well as chest x-ray concerning for probable pneumonia.  Patient on admission noted to be septic with hypotension, acute renal failure, tachycardia, elevated lactic acid level, elevated procalcitonin level.  Lactic acid level and pro calcitonin levels trending down.  Urine cultures pending.  Blood cultures pending.  Leukocytosis.  Decrease IV fluids.  Continue empiric IV vancomycin and IV Zosyn.  Follow.  4.  Anemia/hematemesis Likely dilutional in nature.  Patient on admission was noted to have some hematemesis however no hematemesis noted during the hospitalization.  Patient with no overt bleeding.  Anemia panel has been checked and iron level at 11.  Ferritin at 142.  Vitamin B12 levels at 282.  Will place on IM vitamin B 12 injections.  Follow H&H transfusion threshold hemoglobin less than 8.  Will likely need oral iron supplementation on discharge.  May need IV iron during this hospitalization.  Continue PPI.  Follow.  5.  Hypertension On admission patient noted to be septic with hypotension.  Antihypertensive on hold.  Blood pressure improved.  Follow for now.  6.  Schizophrenia Continue IV Depakote.  IV Haldol as needed.  7.  Hypomagnesemia Repleted.     DVT prophylaxis: SCDs Code Status: DNR Family Communication: Updated patient.  No family at bedside. Disposition Plan: Remain in stepdown unit.  Likely back to nursing facility once medically stable and resolution of small bowel obstruction.   Consultants:   General surgery: Dr. Marcello Moores 09/20/2017  Procedures:  CT abdomen and pelvis 09/20/2017  Chest x-ray 09/20/2017  Abdominal films 09/20/2017  Antimicrobials:  IV Zosyn 09/20/2017  IV vancomycin 09/20/2017   Subjective: In bed.  NG tube in place.  Patient denies any chest pain.  No shortness of breath.  No flatus.  No bowel movement.  Patient states he is  ready for surgery now.  No overt bleeding.  Objective: Vitals:   09/21/17 0500 09/21/17 0600 09/21/17 0700 09/21/17 0800  BP: 131/63 120/65 116/61 125/65  Pulse: 89 94 88 87  Resp: (!) 23 (!) 24 (!) 23 (!) 23  Temp:    99.2 F (37.3 C)  TempSrc:    Oral  SpO2: 94% 91% 94% 93%  Weight: 75.1 kg (165 lb 9.1 oz)     Height:        Intake/Output Summary (Last 24 hours) at 09/21/2017 1012 Last data filed at 09/21/2017 0900 Gross per 24 hour  Intake 3217.95 ml  Output 2825 ml  Net 392.95 ml   Filed Weights   09/20/17 0730 09/21/17 0500  Weight: 72.7 kg (160 lb 4.4 oz) 75.1 kg (165 lb 9.1 oz)    Examination:  General exam: NGT in place. Respiratory system: Clear to auscultation anterior lung fields. Respiratory effort normal. Cardiovascular system: S1 & S2 heard, RRR. No JVD, murmurs, rubs, gallops or clicks. No pedal edema. Gastrointestinal system: Abdomen is distended, soft, nontender to palpation, no bowel sounds.  No rebound.  No guarding.  Central nervous system: Alert and oriented. No focal neurological deficits. Extremities: Symmetric 5 x 5 power. Skin: No rashes, lesions or ulcers Psychiatry: Judgement and insight appear normal. Mood & affect appropriate.     Data Reviewed: I have personally reviewed following labs and imaging studies  CBC: Recent Labs  Lab 09/19/17 2324 09/19/17 2341 09/20/17 0642 09/21/17 0521  WBC 10.0  --  10.7* 12.0*  NEUTROABS 8.2*  --  8.6* 9.6*  HGB 15.6 16.3 13.7 9.8*  HCT 44.8 48.0 39.3 29.3*  MCV 92.0  --  92.0 92.7  PLT 420*  --  350 062   Basic Metabolic Panel: Recent Labs  Lab 09/19/17 2324 09/19/17 2341 09/20/17 0642 09/21/17 0521  NA 131* 130* 136 140  K 4.5 4.4 4.5 3.9  CL 90* 91* 96* 108  CO2 25  --  27 27  GLUCOSE 173* 170* 138* 109*  BUN 25* 24* 33* 26*  CREATININE 1.65* 1.60* 1.82* 1.11  CALCIUM 9.4  --  8.4* 7.8*  MG  --   --  1.5* 2.5*   GFR: Estimated Creatinine Clearance: 67.7 mL/min (by C-G formula  based on SCr of 1.11 mg/dL). Liver Function Tests: Recent Labs  Lab 09/19/17 2324 09/20/17 0642  AST 24 27  ALT 19 21  ALKPHOS 66 51  BILITOT 0.6 0.7  PROT 7.9 6.6  ALBUMIN 4.1 3.4*   Recent Labs  Lab 09/19/17 2324  LIPASE 19   Recent Labs  Lab 09/20/17 0642  AMMONIA 24   Coagulation Profile: Recent Labs  Lab 09/20/17 0642  INR 1.14  1.06   Cardiac Enzymes: Recent Labs  Lab 09/19/17 2324 09/20/17 0642 09/20/17 1245 09/20/17 1843  TROPONINI <0.03 0.03* 0.03* <0.03   BNP (last 3 results) No results for input(s): PROBNP in the last 8760 hours. HbA1C: Recent Labs    09/20/17 0652  HGBA1C 4.9   CBG: Recent Labs  Lab 09/20/17 1151 09/20/17 1725 09/20/17 2348 09/21/17 0631  GLUCAP 106* 100* 107* 134*   Lipid Profile:  No results for input(s): CHOL, HDL, LDLCALC, TRIG, CHOLHDL, LDLDIRECT in the last 72 hours. Thyroid Function Tests: No results for input(s): TSH, T4TOTAL, FREET4, T3FREE, THYROIDAB in the last 72 hours. Anemia Panel: Recent Labs    09/21/17 0811  VITAMINB12 282  FERRITIN 142  TIBC 196*  IRON 11*   Sepsis Labs: Recent Labs  Lab 09/20/17 0426 09/20/17 0642 09/20/17 1042 09/21/17 0521  PROCALCITON  --  4.83  --  3.06  LATICACIDVEN 1.79 2.6* 1.3 1.0    Recent Results (from the past 240 hour(s))  MRSA PCR Screening     Status: Abnormal   Collection Time: 09/20/17 12:37 PM  Result Value Ref Range Status   MRSA by PCR POSITIVE (A) NEGATIVE Final    Comment:        The GeneXpert MRSA Assay (FDA approved for NASAL specimens only), is one component of a comprehensive MRSA colonization surveillance program. It is not intended to diagnose MRSA infection nor to guide or monitor treatment for MRSA infections. RESULT CALLED TO, READ BACK BY AND VERIFIED WITH: Alcide Clever [id number] @ [phone] BY J SCOTTON Performed at [facility] [address] [place], [place] [address]          Radiology  Studies: Ct Abdomen Pelvis Wo Contrast  Result Date: 09/20/2017 CLINICAL DATA:  Acute onset of generalized abdominal pain, nausea, vomiting and fever. EXAM: CT ABDOMEN AND PELVIS WITHOUT CONTRAST TECHNIQUE: Multidetector CT imaging of the abdomen and pelvis was performed following the standard protocol without IV contrast. COMPARISON:  CT of the abdomen and pelvis performed 10/16/2014 FINDINGS: Lower chest: Trace bilateral pleural effusions are noted. Patchy bibasilar airspace opacities may reflect pneumonia. Scattered coronary artery calcifications are seen. Hepatobiliary: The liver is unremarkable in appearance. The patient is status post cholecystectomy, with clips noted at the gallbladder fossa. The common bile duct remains normal in caliber. Pancreas: The pancreas is within normal limits. Spleen: The spleen is unremarkable in appearance. Adrenals/Urinary Tract: The adrenal glands are unremarkable in appearance. Nonspecific perinephric stranding is noted bilaterally. There is no evidence of hydronephrosis. No renal or ureteral stones are identified. Scattered bilateral renal cysts are noted; an isodense cyst on the right is stable from 2016. Stomach/Bowel: The stomach is unremarkable in appearance. There is diffuse distention of small-bowel loops to 6.0 cm in maximal diameter, with a focal transition point is at the mid abdomen along the proximal ileum. There is complete decompression of more distal ileal loops. This is concerning for high-grade small bowel obstruction. Underlying mild mesenteric edema is noted. The appendix is normal in caliber, without evidence of appendicitis. The colon is unremarkable in appearance. Mild wall thickening at the rectum could reflect mild proctitis. Vascular/Lymphatic: Scattered calcification is seen along the abdominal aorta and its branches. The abdominal aorta is otherwise grossly unremarkable. The inferior vena cava is grossly unremarkable. No retroperitoneal  lymphadenopathy is seen. No pelvic sidewall lymphadenopathy is identified. Reproductive: The bladder is decompressed and not well characterized. The prostate remains normal in size. Other: No additional soft tissue abnormalities are seen. A right femoral line is noted. Musculoskeletal: No acute osseous abnormalities are identified. Minimal chronic loss of height is noted at the lower thoracic spine. The visualized musculature is unremarkable in appearance. IMPRESSION: 1. Diffuse distention of small-bowel loops to 6.0 cm in maximal diameter, with a focal transition point at the mid abdomen along the proximal ileum. There is complete decompression of more distal ileal loops. This is concerning for high-grade small bowel  obstruction. Underlying mild mesenteric edema noted. 2. Trace bilateral pleural effusions. Patchy bibasilar airspace opacities may reflect pneumonia. 3. Mild wall thickening at the rectum could reflect mild proctitis. 4. Scattered coronary artery calcifications seen. 5. Scattered bilateral renal cysts noted. Aortic Atherosclerosis (ICD10-I70.0). Electronically Signed   By: Garald Balding M.D.   On: 09/20/2017 04:13   Dg Abd 1 View  Result Date: 09/21/2017 CLINICAL DATA:  Follow-up small bowel obstruction EXAM: ABDOMEN - 1 VIEW COMPARISON:  Yesterday FINDINGS: Nasogastric tube tip overlaps the distal stomach. Unchanged dilated small bowel. Stool seen throughout much of the colon. No visible pneumatosis. No concerning gas collection. Streaky lower lobe opacities, stable from admission CT. IMPRESSION: 1. Unchanged small bowel obstruction. 2. Formed stool seen throughout the colon. 3. Nasogastric tube in good position. Electronically Signed   By: Monte Fantasia M.D.   On: 09/21/2017 09:01   Dg Abdomen 1 View  Result Date: 09/20/2017 CLINICAL DATA:  67 year old male with high-grade small bowel obstruction. NG tube placement. EXAM: ABDOMEN - 1 VIEW COMPARISON:  KUB 0407 hours today, CT Abdomen and  Pelvis 0332 hours today. FINDINGS: Portable AP view at 0649 hours. Enteric tube is present in the epigastrium with side hole at the level of the proximal gastric body. Dilated gas-filled small bowel loops in the abdomen up to 6 centimeters diameter persist. No definite pneumoperitoneum on this portable view. IMPRESSION: 1. Enteric tube placed into the stomach with side hole at the gastric body. 2. Stable small bowel obstruction. Electronically Signed   By: Genevie Ann M.D.   On: 09/20/2017 07:35   Dg Abdomen 1 View  Result Date: 09/20/2017 CLINICAL DATA:  67 y/o  M; NG tube positioning. EXAM: ABDOMEN - 1 VIEW COMPARISON:  09/20/2017 CT abdomen and pelvis. FINDINGS: Diffuse small bowel dilatation. Enteric tube tip projects over the distal stomach. Moderate degenerative changes of the lumbar spine with mild levocurvature. IMPRESSION: Enteric tube tip projects over distal stomach. Diffuse small bowel dilatation. Electronically Signed   By: Kristine Garbe M.D.   On: 09/20/2017 04:26   Dg Chest Port 1 View  Result Date: 09/20/2017 CLINICAL DATA:  Altered mental status EXAM: PORTABLE CHEST 1 VIEW COMPARISON:  December 20, 2014 FINDINGS: Chronic right rib fractures. Cardiomediastinal silhouette is normal. The left lung is clear. Possible mild opacity in the right upper lung. No other acute abnormalities. IMPRESSION: Possible opacity in the right upper lung versus confluence of shadows. Recommend a PA and lateral chest x-ray for better evaluation. Electronically Signed   By: Dorise Bullion III M.D   On: 09/20/2017 00:19   Dg Abd Portable 1v-small Bowel Obstruction Protocol-initial, 8 Hr Delay  Result Date: 09/20/2017 CLINICAL DATA:  8 hour delay has part of a small-bowel obstruction protocol. EXAM: PORTABLE ABDOMEN - 1 VIEW COMPARISON:  09/20/2017 at 6:49 a.m. FINDINGS: The contrast injected through the nasogastric tube lies within the gastric fundus. There is no significant contrast within the small bowel.  There is diffuse and significant small bowel dilation consistent without high-grade obstruction. IMPRESSION: 1. No evidence of contrast extending distal to the stomach. 2. Persistent findings of a high-grade small bowel obstruction. Electronically Signed   By: Lajean Manes M.D.   On: 09/20/2017 20:11   Dg Abd Portable 1 View  Result Date: 09/20/2017 CLINICAL DATA:  Nasogastric tube placement. EXAM: PORTABLE ABDOMEN - 1 VIEW COMPARISON:  CT of the abdomen and pelvis from 10/16/2014 FINDINGS: There is diffuse distention of small-bowel loops to 5.5 cm in diameter, raising  concern for high-grade small bowel obstruction. Residual stool is noted in the colon. The stomach is partially filled with air. The patient's enteric tube is noted ending overlying the body of the stomach, with the side port about the gastroesophageal junction. No acute osseous abnormalities are seen. No free intra-abdominal air is seen, though evaluation for free air is limited on a single supine view. A right femoral line is noted. IMPRESSION: 1. Diffuse distention of small-bowel loops to 5.5 cm in diameter, raising concern for high-grade small bowel obstruction. No free intra-abdominal air seen. 2. Enteric tube noted ending overlying the body of the stomach, with the side port about the gastroesophageal junction. These results were called by telephone at the time of interpretation on 09/20/2017 at 1:12 am to Dr. Joseph Berkshire, who verbally acknowledged these results. Electronically Signed   By: Garald Balding M.D.   On: 09/20/2017 01:13        Scheduled Meds: . Chlorhexidine Gluconate Cloth  6 each Topical Q0600  . mouth rinse  15 mL Mouth Rinse BID  . mupirocin ointment  1 application Nasal BID   Continuous Infusions: . sodium chloride 100 mL/hr at 09/21/17 0953  . famotidine (PEPCID) IV    . piperacillin-tazobactam (ZOSYN)  IV 3.375 g (09/21/17 0824)  . valproate sodium Stopped (09/21/17 0636)  . vancomycin        LOS: 1 day    Time spent: 40 minutes    Irine Seal, MD [facility] Pager [phone]  If 7PM-7AM, please contact night-coverage [email or web address] Password [email or web address] 09/21/2017, 10:12 AM

## 2017-09-21 NOTE — Consult Note (Signed)
Consultation Note Date: 09/21/2017   Patient Name: Kevin Humphrey  DOB: Oct 09, 1950  MRN: [id number]  Age / Sex: 67 y.o., male  PCP: [name], MD Referring Physician: Eugenie Filler, MD  Reason for Consultation: Establishing goals of care  HPI/Patient Profile: 67 y.o. male admitted on 5/29/2019from [facility] facility with persistent nausea and vomiting with hematemesis. He has a past medical history significant for advanced dementia, schizophrenia, hypertension, GERD, compression fracture (2014), dysphagia, and depression. He had a Chest x-ray done at the facility which showed some infiltrates and abdomen x-ray was  concerning for bowel obstruction. During his ED course stool was occult positive. Abdomen was distended and initially patient on arrival was confused and minimally responsive but somewhat improved after starting antibiotics and fluids. CT abdomen pelvis showed bowel obstruction with transition point.  Chest x-ray was showing possibility of infiltrates.  Patient was hypotensive improved with fluids and empiric antibiotic started for sepsis likely from aspiration. Since admission he has been seen by surgery and NG has been placed for bowel rest. Palliative Medicine consulted for goals of care discussion.   Clinical Assessment and Goals of Care: I have reviewed medical records including lab results, imaging, Epic notes, and MAR, received report from the bedside RN, and assessed the patient. I then spoke with legal guardian as patient is a ward of the state. His legal guardians are Blenda Mounts, APS social worker and [name], APS director to discuss diagnosis prognosis, [place], EOL wishes, disposition and options.  Patient has advanced dementia.  He is not able to appropriately engage in goals of care discussion with the legal guardian and myself.  I introduced Palliative Medicine  as specialized medical care for people living with serious illness. It focuses on providing relief from the symptoms and stress of a serious illness. The goal is to improve quality of life for both the patient and the family.  Malachy Mood verbalized that patient was once on the hospice services, however he was discharged several months ago because of stabilization and condition.  His APS team states he has been under their legal guardianship for more than 7 years now.  As far as functional and nutritional status they report patient had a decent quality of life prior to admission.  He required assistance with his ADLs however he was able to see in a wheelchair and participate in the advance at the facility.  He has had dementia for several years now and has been unable to make his own decisions.  Malachy Mood states 2 years ago he began with hospice services and they felt at that time end of life was approaching however he recovered and has been doing fine since then.  She reports based on the information from the nursing staff at the facility his appetite was pretty good and he would often eat several snacks throughout the day.  She states this is what alarmed the staff due to a change in his appetite and mental status.  We discussed his current illness and what  it means in the larger context of his on-going co-morbidities.  Natural disease trajectory and expectations at EOL were discussed.  Malachy Mood verbalized understanding and reports a week ago his quality of life was sufficient and they would hope that he could return to baseline or at least near baseline, despite having advanced dementia.  I attempted to elicit values and goals of care important to the patient.    The difference between aggressive medical intervention and comfort care was considered in light of the patient's goals of care.  Both Malachy Mood and Martin Majestic would like for patient to continue with aggressive medical interventions.  They understand that at  this time surgery will continue to evaluate his current bowel obstruction and if no improvement or they deem necessary he may require surgical intervention.  Both APS workers verbalized understanding and their agreement to proceed with any form of surgical interventions as needed.  Advanced directives, concepts specific to code status, artifical feeding and hydration, and rehospitalization were considered and discussed.  Malachy Mood did verify that patient is a DNR/DNI.   Hospice and Palliative Care services outpatient were explained and offered.  APS worker stated they were not interested in patient returning back to facility with hospice services unless his condition drastically changed after his surgical intervention.  At that time they will assess patient for his appropriateness for hospice.  Otherwise at this point they would like him to return back [facility] with palliative services in place.  Questions and concerns were addressed. The APS team was encouraged to call with questions or concerns.  PMT will continue to support holistically.  Primary decision maker: LEGAL GUARDIAN: [name], Director APS and Robby Sermon, [place] social worker.  Malachy Mood is Scientific laboratory technician.  If she is needed any time throughout the weekend or after hours she may be reached on her cell phone at [phone] and her office number is [phone].     SUMMARY OF RECOMMENDATIONS    DNR/DNI as verified by APS director, DNR form has been placed on chart.  Continue to treat the treatable while hospitalized.  Per request of Malachy Mood, APS director/legal guardian they are in agreement for surgical intervention if surgery team deems necessary.  Please contact Shelda Jakes at [phone] or [phone] and she will provide verbal consent for procedures etc.  Per APS decision-maker patient is to return back to [facility] facility at discharge.  They would also like palliative services available outpatient.  Case  manager consult for outpatient palliative services at discharge.  Palliative medicine team will continue to support patient, APS workers, and medical team during hospitalization as needed.  Code Status/Advance Care Planning:  DNR/DNI as verified by legal guardian  Palliative Prophylaxis:   Aspiration, Bowel Regimen, Delirium Protocol, Frequent Pain Assessment and Turn Reposition  Additional Recommendations (Limitations, Scope, Preferences):  Full Scope Treatment including surgical interventions if needed at [place] guardian request.   Psycho-social/Spiritual:  Desire for further Chaplaincy support:Yes  Prognosis:   Unable to determine-guarded in the setting of small bowel obstruction, decreased p.o. intake, advance dementia, schizophrenia, dysphasia, and decreased mobility.  Discharge Planning: To return back to [facility] with outpatient Palliative services as requested by APS guardianship.       Primary Diagnoses: Present on Admission: . SBO (small bowel obstruction) ([place]) . ARF (acute renal failure) ([place]) . Hematemesis . Schizophrenia ([place]) . Malnutrition of moderate degree ([place]) . Sepsis ([place])   I have reviewed the medical record, interviewed the patient and family, and examined the patient. The  following aspects are pertinent.  Past Medical History:  Diagnosis Date  . Allergic rhinitis 08/02/2012  . Depression 08/02/2012  . Dysphagia, unspecified 08/02/2012  . GERD (gastroesophageal reflux disease) 08/02/2012  . Other and unspecified hyperlipidemia 08/02/2012  . Schizophrenia ([place]) 08/02/2012  . Vertebral compression fracture ([place]) 08/02/2012   Social History   Socioeconomic History  . Marital status: Unknown    Spouse name: Not on file  . Number of children: Not on file  . Years of education: Not on file  . Highest education level: Not on file  Occupational History  . Not on file  Social Needs  . Financial resource strain: Not on file  . Food insecurity:     Worry: Not on file    Inability: Not on file  . Transportation needs:    Medical: Not on file    Non-medical: Not on file  Tobacco Use  . Smoking status: Former Research scientist (life sciences)  . Smokeless tobacco: Never Used  Substance and Sexual Activity  . Alcohol use: Not Currently  . Drug use: Not Currently  . Sexual activity: Not on file  Lifestyle  . Physical activity:    Days per week: Not on file    Minutes per session: Not on file  . Stress: Not on file  Relationships  . Social connections:    Talks on phone: Not on file    Gets together: Not on file    Attends religious service: Not on file    Active member of club or organization: Not on file    Attends meetings of clubs or organizations: Not on file    Relationship status: Not on file  Other Topics Concern  . Not on file  Social History Narrative  . Not on file   Family History  Family history unknown: Yes   Scheduled Meds: . Chlorhexidine Gluconate Cloth  6 each Topical Q0600  . cyanocobalamin  1,000 mcg Intramuscular Daily  . mouth rinse  15 mL Mouth Rinse BID  . mupirocin ointment  1 application Nasal BID   Continuous Infusions: . sodium chloride 100 mL/hr at 09/21/17 0953  . famotidine (PEPCID) IV    . piperacillin-tazobactam (ZOSYN)  IV 3.375 g (09/21/17 0824)  . valproate sodium Stopped (09/21/17 0636)  . vancomycin     PRN Meds:.acetaminophen **OR** acetaminophen, hydrALAZINE, ondansetron **OR** ondansetron (ZOFRAN) IV Medications Prior to Admission:  Prior to Admission medications   Medication Sig Start Date End Date Taking? Authorizing Provider  acetaminophen (TYLENOL) 325 MG tablet Take 650 mg by mouth 3 (three) times daily.    Yes [provider]  benztropine (COGENTIN) 1 MG tablet Take 1 mg by mouth 2 (two) times daily. 04/15/15  Yes [provider]  buPROPion (WELLBUTRIN) 75 MG tablet Take 150 mg by mouth daily.   Yes [provider]  Cholecalciferol (VITAMIN D3) 50000 units CAPS Take  50,000 Units by mouth every 30 (thirty) days. On the 15th   Yes [provider]  clonazePAM (KLONOPIN) 0.5 MG tablet Take 1 mg by mouth at bedtime.    Yes [provider]  divalproex (DEPAKOTE SPRINKLE) 125 MG capsule Take 250 mg by mouth 3 (three) times daily.   Yes [provider]  ferrous sulfate 325 (65 FE) MG tablet Take 325 mg by mouth daily.   Yes [provider]  lisinopril (PRINIVIL,ZESTRIL) 5 MG tablet Take 5 mg by mouth daily.   Yes [provider]  Multiple Vitamin (MULTIVITAMIN) tablet Take 1 tablet  by mouth daily.   Yes [provider]  ondansetron (ZOFRAN) 4 MG tablet Take 4 mg by mouth every 8 (eight) hours as needed for nausea or vomiting.   Yes [provider]  Propylene Glycol (SYSTANE BALANCE) 0.6 % SOLN Apply 1 drop to eye 2 (two) times daily. For dry eyes   Yes [provider]  ranitidine (ZANTAC) 75 MG tablet Take 75 mg by mouth daily.   Yes [provider]  risperiDONE (RISPERDAL) 3 MG tablet Take 3 mg by mouth 2 (two) times daily.   Yes [provider]  traZODone (DESYREL) 100 MG tablet Take 100 mg by mouth at bedtime as needed for sleep.   Yes [provider]  LORazepam (ATIVAN) 1 MG tablet Take one tablet by mouth every 4 hours as needed for anxiety Patient not taking: Reported on 09/20/2017 03/08/15   Mariea Clonts, Tiffany L, DO  Morphine Sulfate (MORPHINE CONCENTRATE) 10 MG/0.5ML SOLN concentrated solution Take 0.25 mLs (5 mg total) by mouth every 2 (two) hours as needed for severe pain or shortness of breath. Patient not taking: Reported on 09/20/2017 12/25/14   Elgergawy, Silver Huguenin, MD   No Known Allergies Review of Systems  Unable to perform ROS: Dementia    Physical Exam  Constitutional: Vital signs are normal. He appears well-developed. He has a sickly appearance.  HENT:  NGT in place  Cardiovascular: Normal rate, regular rhythm, normal heart sounds, intact distal pulses  and normal pulses.  Pulmonary/Chest: Effort normal. He has decreased breath sounds.  Abdominal: Bowel sounds are decreased.  Musculoskeletal:  Generalized weakness   Neurological: He is alert. He is not disoriented.  Psychiatric: Cognition and memory are impaired.  Nursing note and vitals reviewed.   Vital Signs: BP 122/61   Pulse 96   Temp 99.2 F (37.3 C) (Oral)   Resp (!) 31   Ht 5\' 11"  (1.803 m)   Wt 75.1 kg (165 lb 9.1 oz)   SpO2 90%   BMI 23.09 kg/m  Pain Scale: 0-10   Pain Score: 0-No pain   SpO2: SpO2: 90 % O2 Device:SpO2: 90 % O2 Flow Rate: .O2 Flow Rate (L/min): 3 L/min  IO: Intake/output summary:   Intake/Output Summary (Last 24 hours) at 09/21/2017 1146 Last data filed at 09/21/2017 1100 Gross per 24 hour  Intake 3915.03 ml  Output 2825 ml  Net 1090.03 ml    LBM: Last BM Date: (PTA) Baseline Weight: Weight: 72.7 kg (160 lb 4.4 oz) Most recent weight: Weight: 75.1 kg (165 lb 9.1 oz)     Palliative Assessment/Data: PPS 30%   Time In: 1015 Time Out: 1115 Time Total: 60 min  Greater than 50%  of this time was spent counseling and coordinating care related to the above assessment and plan.  Signed by: Alda Lea, NP-BC Palliative Medicine Team  Phone: [phone] Fax: [phone]   Please contact Palliative Medicine Team phone at [phone] for questions and concerns.  For individual provider: See Shea Evans

## 2017-09-21 NOTE — Progress Notes (Addendum)
Kevin  Yellow Humphrey., Kevin Humphrey, Kevin Humphrey [address] Phone: [phone]  FAX: [phone]      Kevin Humphrey [id number] 25-Nov-1950  CARE TEAM:  PCP: [name], MD  Outpatient Care Team: Patient Care Team: Duffy Bruce, Manya Silvas, MD as PCP - General (Internal Medicine)  Inpatient Treatment Team: Treatment Team: Attending Provider: Eugenie Filler, MD; Rounding Team: Redmond Baseman, MD; Consulting Physician: Edison Pace, Md, MD; Technician: Cyril Loosen, [place]; Case Manager: Kevin Rider, Kevin Humphrey   Problem List:   Principal Problem:   SBO (small bowel obstruction) [facility]) Active Problems:   Schizophrenia ([place])   Malnutrition of moderate degree ([place])   ARF (acute renal failure) ([place])   Hematemesis   Sepsis ([place])      * No surgery found *     Assessment  SBO most likely due to adhesions from prior laparotomy incision of unknown purpose and a demented schizophrenic patient had a skilled nursing facility.  Questionable DNR.  Questionable hospice  Plan:  -Hemodynamically the patient is better, so there is no need for emergency today.  However, if he does not open up by 48 hours, standard care is operative exploration.  Try laparoscopically versus laparotomy with lysis adhesions to find transition point that seems to be in the central retroperitoneum.  The biggest challenge is the patient is confused and demented and cannot give consent.  I need someone with power of attorney to help make that decision.  Paperwork should be coming from [facility] but it does not delineate that out.  I also question how aggressive we should be in a demented schizophrenic patient with a DNR.  Is this what the patient would want?  Is this is what the power of attorney would want?.  I request palliative care to help evaluate for goals of care and see what they would recommend.  Discussed with Dr. [place] Silos [facility] IM as well.  Not able to reach any  pt contacts today.  -Continue nasogastric tube decompression. -VTE prophylaxis- SCDs, etc -mobilize as tolerated to help recovery  ADDENDUM: Palliative care able to reach caretaker with power of attorney.  They note the patient had some functioning and decent quality life and wish to be aggressive with the patient.  Proceed with surgery if needed.  We will reevaluate tomorrow to see the if he needs surgery  45 minutes spent in review, evaluation, examination, counseling, and coordination of care.  More than 50% of that time was spent in counseling.  Kevin Humphrey, M.D., F.A.C.S. Gastrointestinal and Minimally Invasive Surgery [facility] [address], [place], [place] [address] [phone] Main / Paging   09/21/2017    Subjective: (Chief complaint)  Sleeping Then shouting "Idonawanaremesureeeeeee!!!  Ima scared to death!!!!'  Objective:  Vital signs:  Vitals:   09/21/17 0500 09/21/17 0600 09/21/17 0700 09/21/17 0800  BP: 131/63 120/65 116/61 125/65  Pulse: 89 94 88 87  Resp: (!) 23 (!) 24 (!) 23 (!) 23  Temp:    99.2 F (37.3 C)  TempSrc:    Oral  SpO2: 94% 91% 94% 93%  Weight: 75.1 kg (165 lb 9.1 oz)     Height:        Last BM Date: (PTA)  Intake/Output   Yesterday:  05/30 5397 - 05/31 0700 In: 3098 [I.V.:2131.3; IV Piggyback:966.7] Out: 2825 [Urine:375; Emesis/NG output:2450] This shift:  No intake/output data recorded.  Bowel function:  Flatus: No  BM:  No  Drain: Bilious NGT   Physical Exam:  General: Pt awakens in moderate acute distress Eyes: PERRL, normal EOM.  Sclera clear.  No icterus Neuro: CN II-XII intact w/o focal sensory/motor deficits. Lymph: No head/neck/groin lymphadenopathy Psych: Anxious.  Confused.  Not following commands.  Will not interact. HENT: Normocephalic, Mucus membranes moist.  No thrush Neck: Supple, No tracheal deviation Chest: No chest wall pain w good excursion CV:  Pulses  intact.  Regular rhythm MS: Normal AROM mjr joints.  No obvious deformity  Abdomen: Soft.  Moderately distended.  Nontender.  No evidence of peritonitis.  No incarcerated hernias.  Ext:  No deformity.  No mjr edema.  No cyanosis Skin: No petechiae / purpura  Results:   Labs: Results for orders placed or performed during the hospital encounter of 09/19/17 (from the past 48 hour(s))  Lipase, blood     Status: None   Collection Time: 09/19/17 11:24 PM  Result Value Ref Range   Lipase 19 11 - 51 U/L    Comment: Performed at [facility], [place] [address] [place], [place] [address]  Comprehensive metabolic panel     Status: Abnormal   Collection Time: 09/19/17 11:24 PM  Result Value Ref Range   Sodium 131 (L) 135 - 145 mmol/L   Potassium 4.5 3.5 - 5.1 mmol/L   Chloride 90 (L) 101 - 111 mmol/L   CO2 25 22 - 32 mmol/L   Glucose, Bld 173 (H) 65 - 99 mg/dL   BUN 25 (H) 6 - 20 mg/dL   Creatinine, Ser 1.65 (H) 0.61 - 1.24 mg/dL   Calcium 9.4 8.9 - 10.3 mg/dL   Total Protein 7.9 6.5 - 8.1 g/dL   Albumin 4.1 3.5 - 5.0 g/dL   AST 24 15 - 41 U/L   ALT 19 17 - 63 U/L   Alkaline Phosphatase 66 38 - 126 U/L   Total Bilirubin 0.6 0.3 - 1.2 mg/dL   GFR calc non Af Amer 41 (L) >60 mL/min   GFR calc Af Amer 48 (L) >60 mL/min    Comment: (NOTE) The eGFR has been calculated using the CKD EPI equation. This calculation has not been validated in all clinical situations. eGFR's persistently <60 mL/min signify possible Chronic Kidney Disease.    Anion gap 16 (H) 5 - 15    Comment: Performed at [facility], [place] [address] [place], [place] [address]  Brain natriuretic peptide     Status: None   Collection Time: 09/19/17 11:24 PM  Result Value Ref Range   B Natriuretic Peptide 39.9 0.0 - 100.0 pg/mL    Comment: Performed at [facility], [place] [address] [place], [place] [address]  Troponin I     Status: None   Collection Time: 09/19/17  11:24 PM  Result Value Ref Range   Troponin I <0.03 <0.03 ng/mL    Comment: Performed at [facility], [place] [address] [place], [place] [address]  CBC with Differential     Status: Abnormal   Collection Time: 09/19/17 11:24 PM  Result Value Ref Range   WBC 10.0 4.0 - 10.5 K/uL   RBC 4.87 4.22 - 5.81 MIL/uL   Hemoglobin 15.6 13.0 - 17.0 g/dL   HCT 44.8 39.0 - 52.0 %   MCV 92.0 78.0 - 100.0 fL   MCH 32.0 26.0 - 34.0 pg   MCHC 34.8 30.0 - 36.0 g/dL   RDW 13.5 11.5 - 15.5 %  Platelets 420 (H) 150 - 400 K/uL   Neutrophils Relative % 82 %   Neutro Abs 8.2 (H) 1.7 - 7.7 K/uL   Lymphocytes Relative 10 %   Lymphs Abs 1.0 0.7 - 4.0 K/uL   Monocytes Relative 8 %   Monocytes Absolute 0.8 0.1 - 1.0 K/uL   Eosinophils Relative 0 %   Eosinophils Absolute 0.0 0.0 - 0.7 K/uL   Basophils Relative 0 %   Basophils Absolute 0.0 0.0 - 0.1 K/uL    Comment: Performed at [facility], [place] [address] [place], [place] [address]  Type and screen [place]     Status: None   Collection Time: 09/19/17 11:33 PM  Result Value Ref Range   ABO/RH(D) O POS    Antibody Screen POS    Sample Expiration 09/22/2017    Antibody Identification ANTI JKA (Kidd a)    PT AG Type      NEGATIVE FOR KIDD A ANTIGEN Performed at [facility], [place] [address] [place], [place] [address]   I-Stat CG4 Lactic Acid, ED     Status: Abnormal   Collection Time: 09/19/17 11:41 PM  Result Value Ref Range   Lactic Acid, Venous 2.40 (HH) 0.5 - 1.9 mmol/L   Comment NOTIFIED PHYSICIAN   I-Stat Chem 8, ED     Status: Abnormal   Collection Time: 09/19/17 11:41 PM  Result Value Ref Range   Sodium 130 (L) 135 - 145 mmol/L   Potassium 4.4 3.5 - 5.1 mmol/L   Chloride 91 (L) 101 - 111 mmol/L   BUN 24 (H) 6 - 20 mg/dL   Creatinine, Ser 1.60 (H) 0.61 - 1.24 mg/dL   Glucose, Bld 170 (H) 65 - 99 mg/dL   Calcium, Ion 1.11 (L) 1.15 - 1.40 mmol/L   TCO2 27 22 - 32  mmol/L   Hemoglobin 16.3 13.0 - 17.0 g/dL   HCT 48.0 39.0 - 52.0 %  Urinalysis, Routine w reflex microscopic     Status: Abnormal   Collection Time: 09/20/17  1:28 AM  Result Value Ref Range   Color, Urine YELLOW YELLOW   APPearance HAZY (A) CLEAR   Specific Gravity, Urine 1.017 1.005 - 1.030   pH 6.0 5.0 - 8.0   Glucose, UA NEGATIVE NEGATIVE mg/dL   Hgb urine dipstick NEGATIVE NEGATIVE   Bilirubin Urine NEGATIVE NEGATIVE   Ketones, ur NEGATIVE NEGATIVE mg/dL   Protein, ur NEGATIVE NEGATIVE mg/dL   Nitrite NEGATIVE NEGATIVE   Leukocytes, UA NEGATIVE NEGATIVE    Comment: Performed at [facility], [place] [address], [place], [place] [address]  POC occult blood, ED     Status: Abnormal   Collection Time: 09/20/17  1:42 AM  Result Value Ref Range   Fecal Occult Bld POSITIVE (A) NEGATIVE  I-Stat CG4 Lactic Acid, ED     Status: None   Collection Time: 09/20/17  4:26 AM  Result Value Ref Range   Lactic Acid, Venous 1.79 0.5 - 1.9 mmol/L  Ammonia     Status: None   Collection Time: 09/20/17  6:42 AM  Result Value Ref Range   Ammonia 24 9 - 35 umol/L    Comment: Performed at [facility], [place] [address], [place],  [address]  Protime-INR     Status: None   Collection Time: 09/20/17  6:42 AM  Result Value Ref Range   Prothrombin Time 13.7 11.4 - 15.2 seconds   INR 1.06  Comment: Performed at [facility], [place] [address] [place], [place] [address]  CBC with Differential     Status: Abnormal   Collection Time: 09/20/17  6:42 AM  Result Value Ref Range   WBC 10.7 (H) 4.0 - 10.5 K/uL   RBC 4.27 4.22 - 5.81 MIL/uL   Hemoglobin 13.7 13.0 - 17.0 g/dL   HCT 39.3 39.0 - 52.0 %   MCV 92.0 78.0 - 100.0 fL   MCH 32.1 26.0 - 34.0 pg   MCHC 34.9 30.0 - 36.0 g/dL   RDW 13.8 11.5 - 15.5 %   Platelets 350 150 - 400 K/uL   Neutrophils Relative % 80 %   Neutro Abs 8.6 (H) 1.7 - 7.7 K/uL   Lymphocytes Relative 9 %   Lymphs Abs 1.0  0.7 - 4.0 K/uL   Monocytes Relative 11 %   Monocytes Absolute 1.2 (H) 0.1 - 1.0 K/uL   Eosinophils Relative 0 %   Eosinophils Absolute 0.0 0.0 - 0.7 K/uL   Basophils Relative 0 %   Basophils Absolute 0.0 0.0 - 0.1 K/uL    Comment: Performed at [facility], [place] [address], [place], [place] [address]  Comprehensive metabolic panel     Status: Abnormal   Collection Time: 09/20/17  6:42 AM  Result Value Ref Range   Sodium 136 135 - 145 mmol/L   Potassium 4.5 3.5 - 5.1 mmol/L   Chloride 96 (L) 101 - 111 mmol/L   CO2 27 22 - 32 mmol/L   Glucose, Bld 138 (H) 65 - 99 mg/dL   BUN 33 (H) 6 - 20 mg/dL   Creatinine, Ser 1.82 (H) 0.61 - 1.24 mg/dL   Calcium 8.4 (L) 8.9 - 10.3 mg/dL   Total Protein 6.6 6.5 - 8.1 g/dL   Albumin 3.4 (L) 3.5 - 5.0 g/dL   AST 27 15 - 41 U/L   ALT 21 17 - 63 U/L   Alkaline Phosphatase 51 38 - 126 U/L   Total Bilirubin 0.7 0.3 - 1.2 mg/dL   GFR calc non Af Amer 36 (L) >60 mL/min   GFR calc Af Amer 42 (L) >60 mL/min    Comment: (NOTE) The eGFR has been calculated using the CKD EPI equation. This calculation has not been validated in all clinical situations. eGFR's persistently <60 mL/min signify possible Chronic Kidney Disease.    Anion gap 13 5 - 15    Comment: Performed at [facility], [place] [address], [place], [place] [address]  Lactic acid, plasma     Status: Abnormal   Collection Time: 09/20/17  6:42 AM  Result Value Ref Range   Lactic Acid, Venous 2.6 (HH) 0.5 - 1.9 mmol/L    Comment: CRITICAL RESULT CALLED TO, READ BACK BY AND VERIFIED WITHMatthew Saras Kevin Humphrey AT [phone] 09/20/17 MULLINS,T Performed at [facility], [place] [address] [place], [place] [address]   Procalcitonin     Status: None   Collection Time: 09/20/17  6:42 AM  Result Value Ref Range   Procalcitonin 4.83 ng/mL    Comment:        Interpretation: PCT > 2 ng/mL: Systemic infection (sepsis) is likely, unless other causes are  known. (NOTE)       Sepsis PCT Algorithm           Lower Respiratory Tract  Infection PCT Algorithm    ----------------------------     ----------------------------         PCT < 0.25 ng/mL                PCT < 0.10 ng/mL         Strongly encourage             Strongly discourage   discontinuation of antibiotics    initiation of antibiotics    ----------------------------     -----------------------------       PCT 0.25 - 0.50 ng/mL            PCT 0.10 - 0.25 ng/mL               OR       >80% decrease in PCT            Discourage initiation of                                            antibiotics      Encourage discontinuation           of antibiotics    ----------------------------     -----------------------------         PCT >= 0.50 ng/mL              PCT 0.26 - 0.50 ng/mL               AND       <80% decrease in PCT              Encourage initiation of                                             antibiotics       Encourage continuation           of antibiotics    ----------------------------     -----------------------------        PCT >= 0.50 ng/mL                  PCT > 0.50 ng/mL               AND         increase in PCT                  Strongly encourage                                      initiation of antibiotics    Strongly encourage escalation           of antibiotics                                     -----------------------------                                           PCT <= 0.25 ng/mL  OR                                        > 80% decrease in PCT                                     Discontinue / Do not initiate                                             antibiotics Performed at [place] [address], [place], [place] [address]   Protime-INR     Status: None   Collection Time: 09/20/17  6:42 AM  Result Value Ref Range   Prothrombin Time 14.6  11.4 - 15.2 seconds   INR 1.14     Comment: Performed at [facility], [place] [address], [place], [place] [address]  APTT     Status: None   Collection Time: 09/20/17  6:42 AM  Result Value Ref Range   aPTT 31 24 - 36 seconds    Comment: Performed at [facility], [place] [address] [place], [place] [address]  HIV antibody (Routine Testing)     Status: None   Collection Time: 09/20/17  6:42 AM  Result Value Ref Range   HIV Screen 4th Generation wRfx Non Reactive Non Reactive    Comment: (NOTE) Performed At: [facility] [place], [place] [id number] Rush Farmer MD [id number] Performed at [facility], [place] [address], [place], [place] [address]   Troponin I (q 6hr x 3)     Status: Abnormal   Collection Time: 09/20/17  6:42 AM  Result Value Ref Range   Troponin I 0.03 (HH) <0.03 ng/mL    Comment: CRITICAL RESULT CALLED TO, READ BACK BY AND VERIFIED WITHMatthew Saras Kevin Humphrey AT [phone] 09/20/17 MULLINS,T Performed at [facility], [place] [address] [place], [place] [address]   Valproic acid level     Status: Abnormal   Collection Time: 09/20/17  6:42 AM  Result Value Ref Range   Valproic Acid Lvl 18 (L) 50.0 - 100.0 ug/mL    Comment: Performed at [facility], [place] [address], [place], [place] [address]  Magnesium     Status: Abnormal   Collection Time: 09/20/17  6:42 AM  Result Value Ref Range   Magnesium 1.5 (L) 1.7 - 2.4 mg/dL    Comment: Performed at [facility], [place] [address] [place], [place] [address]  Hemoglobin A1c     Status: None   Collection Time: 09/20/17  6:52 AM  Result Value Ref Range   Hgb A1c MFr Bld 4.9 4.8 - 5.6 %    Comment: (NOTE) Pre diabetes:          5.7%-6.4% Diabetes:              >6.4% Glycemic control for   <7.0% adults with diabetes    Mean Plasma Glucose 93.93 mg/dL    Comment: Performed at [place] [address], [place], [place] [address]  Lactic acid, plasma     Status: None   Collection Time: 09/20/17 10:42 AM  Result Value Ref  Range   Lactic Acid, Venous 1.3 0.5 - 1.9 mmol/L    Comment: Performed at [facility], [place] [address] [place], [place] [address]  Glucose, capillary     Status: Abnormal   Collection Time: 09/20/17 11:51 AM  Result Value Ref Range   Glucose-Capillary 106 (H) 65 - 99 mg/dL  MRSA PCR Screening     Status: Abnormal   Collection Time: 09/20/17 12:37 PM  Result Value Ref Range   MRSA by PCR POSITIVE (A) NEGATIVE    Comment:        The GeneXpert MRSA Assay (FDA approved for NASAL specimens only), is one component of a comprehensive MRSA colonization surveillance program. It is not intended to diagnose MRSA infection nor to guide or monitor treatment for MRSA infections. RESULT CALLED TO, READ BACK BY AND VERIFIED WITH: Alcide Clever [id number] @ 2683 BY J SCOTTON Performed at [place] [address], [place], [place] [address]   Troponin I (q 6hr x 3)     Status: Abnormal   Collection Time: 09/20/17 12:45 PM  Result Value Ref Range   Troponin I 0.03 (HH) <0.03 ng/mL    Comment: CRITICAL VALUE NOTED.  VALUE IS CONSISTENT WITH PREVIOUSLY REPORTED AND CALLED VALUE. Performed at [facility], [place] [address], [place], [place] [address]   Glucose, capillary     Status: Abnormal   Collection Time: 09/20/17  5:25 PM  Result Value Ref Range   Glucose-Capillary 100 (H) 65 - 99 mg/dL  Sodium, urine, random     Status: None   Collection Time: 09/20/17  5:31 PM  Result Value Ref Range   Sodium, Ur 39 mmol/L    Comment: Performed at [facility], [place] [address], [place], [place] [address]  Creatinine, urine, random     Status: None   Collection Time: 09/20/17  5:31 PM  Result Value Ref Range   Creatinine, Urine 144.93 mg/dL    Comment: Performed at [facility],  [place] [address], [place], [place] [address]  Troponin I (q 6hr x 3)     Status: None   Collection Time: 09/20/17  6:43 PM  Result Value Ref Range   Troponin I <0.03 <0.03 ng/mL    Comment: Performed at [facility], [place] [address] [place], [place] [address]  Glucose, capillary     Status: Abnormal   Collection Time: 09/20/17 11:48 PM  Result Value Ref Range   Glucose-Capillary 107 (H) 65 - 99 mg/dL   Comment 1 Notify Kevin Humphrey    Comment 2 Document in Chart   Lactic acid, plasma     Status: None   Collection Time: 09/21/17  5:21 AM  Result Value Ref Range   Lactic Acid, Venous 1.0 0.5 - 1.9 mmol/L    Comment: Performed at [facility], [place] [address] [place], [place] [address]  Magnesium     Status: Abnormal   Collection Time: 09/21/17  5:21 AM  Result Value Ref Range   Magnesium 2.5 (H) 1.7 - 2.4 mg/dL    Comment: Performed at [facility], [place] [address] [place], [place] [address]  Procalcitonin     Status: None   Collection Time: 09/21/17  5:21 AM  Result Value Ref Range   Procalcitonin 3.06 ng/mL    Comment:        Interpretation: PCT > 2 ng/mL: Systemic infection (sepsis) is likely, unless other causes are known. (NOTE)  Sepsis PCT Algorithm           Lower Respiratory Tract                                      Infection PCT Algorithm    ----------------------------     ----------------------------         PCT < 0.25 ng/mL                PCT < 0.10 ng/mL         Strongly encourage             Strongly discourage   discontinuation of antibiotics    initiation of antibiotics    ----------------------------     -----------------------------       PCT 0.25 - 0.50 ng/mL            PCT 0.10 - 0.25 ng/mL               OR       >80% decrease in PCT            Discourage initiation of                                            antibiotics      Encourage discontinuation           of antibiotics     ----------------------------     -----------------------------         PCT >= 0.50 ng/mL              PCT 0.26 - 0.50 ng/mL               AND       <80% decrease in PCT              Encourage initiation of                                             antibiotics       Encourage continuation           of antibiotics    ----------------------------     -----------------------------        PCT >= 0.50 ng/mL                  PCT > 0.50 ng/mL               AND         increase in PCT                  Strongly encourage                                      initiation of antibiotics    Strongly encourage escalation           of antibiotics                                     -----------------------------  PCT <= 0.25 ng/mL                                                 OR                                        > 80% decrease in PCT                                     Discontinue / Do not initiate                                             antibiotics Performed at [place] [address] [place], [place] [address]   CBC with Differential/Platelet     Status: Abnormal   Collection Time: 09/21/17  5:21 AM  Result Value Ref Range   WBC 12.0 (H) 4.0 - 10.5 K/uL   RBC 3.16 (L) 4.22 - 5.81 MIL/uL   Hemoglobin 9.8 (L) 13.0 - 17.0 g/dL    Comment: REPEATED TO VERIFY DELTA CHECK NOTED    HCT 29.3 (L) 39.0 - 52.0 %   MCV 92.7 78.0 - 100.0 fL   MCH 31.0 26.0 - 34.0 pg   MCHC 33.4 30.0 - 36.0 g/dL   RDW 14.2 11.5 - 15.5 %   Platelets 241 150 - 400 K/uL   Neutrophils Relative % 80 %   Neutro Abs 9.6 (H) 1.7 - 7.7 K/uL   Lymphocytes Relative 10 %   Lymphs Abs 1.2 0.7 - 4.0 K/uL   Monocytes Relative 10 %   Monocytes Absolute 1.2 (H) 0.1 - 1.0 K/uL   Eosinophils Relative 0 %   Eosinophils Absolute 0.0 0.0 - 0.7 K/uL   Basophils Relative 0 %   Basophils Absolute 0.0 0.0 - 0.1 K/uL    Comment: Performed at [facility], [place] [address] [place], [place] [address]  Basic metabolic panel     Status: Abnormal   Collection Time: 09/21/17  5:21 AM  Result Value Ref Range   Sodium 140 135 - 145 mmol/L   Potassium 3.9 3.5 - 5.1 mmol/L   Chloride 108 101 - 111 mmol/L   CO2 27 22 - 32 mmol/L   Glucose, Bld 109 (H) 65 - 99 mg/dL   BUN 26 (H) 6 - 20 mg/dL   Creatinine, Ser 1.11 0.61 - 1.24 mg/dL   Calcium 7.8 (L) 8.9 - 10.3 mg/dL   GFR calc non Af Amer >60 >60 mL/min   GFR calc Af Amer >60 >60 mL/min    Comment: (NOTE) The eGFR has been calculated using the CKD EPI equation. This calculation has not been validated in all clinical situations. eGFR's persistently <60 mL/min signify possible Chronic Kidney Disease.    Anion gap 5 5 - 15    Comment: Performed at [facility], [place] [address] [place], [place] [address]  Glucose, capillary     Status: Abnormal   Collection Time: 09/21/17  6:31 AM  Result Value Ref Range   Glucose-Capillary 134 (  H) 65 - 99 mg/dL   Comment 1 Notify Kevin Humphrey    Comment 2 Document in Chart     Imaging / Studies: Ct Abdomen Pelvis Wo Contrast  Result Date: 09/20/2017 CLINICAL DATA:  Acute onset of generalized abdominal pain, nausea, vomiting and fever. EXAM: CT ABDOMEN AND PELVIS WITHOUT CONTRAST TECHNIQUE: Multidetector CT imaging of the abdomen and pelvis was performed following the standard protocol without IV contrast. COMPARISON:  CT of the abdomen and pelvis performed 10/16/2014 FINDINGS: Lower chest: Trace bilateral pleural effusions are noted. Patchy bibasilar airspace opacities may reflect pneumonia. Scattered coronary artery calcifications are seen. Hepatobiliary: The liver is unremarkable in appearance. The patient is status post cholecystectomy, with clips noted at the gallbladder fossa. The common bile duct remains normal in caliber. Pancreas: The pancreas is within normal limits. Spleen: The spleen is unremarkable in appearance. Adrenals/Urinary  Tract: The adrenal glands are unremarkable in appearance. Nonspecific perinephric stranding is noted bilaterally. There is no evidence of hydronephrosis. No renal or ureteral stones are identified. Scattered bilateral renal cysts are noted; an isodense cyst on the right is stable from 2016. Stomach/Bowel: The stomach is unremarkable in appearance. There is diffuse distention of small-bowel loops to 6.0 cm in maximal diameter, with a focal transition point is at the mid abdomen along the proximal ileum. There is complete decompression of more distal ileal loops. This is concerning for high-grade small bowel obstruction. Underlying mild mesenteric edema is noted. The appendix is normal in caliber, without evidence of appendicitis. The colon is unremarkable in appearance. Mild wall thickening at the rectum could reflect mild proctitis. Vascular/Lymphatic: Scattered calcification is seen along the abdominal aorta and its branches. The abdominal aorta is otherwise grossly unremarkable. The inferior vena cava is grossly unremarkable. No retroperitoneal lymphadenopathy is seen. No pelvic sidewall lymphadenopathy is identified. Reproductive: The bladder is decompressed and not well characterized. The prostate remains normal in size. Other: No additional soft tissue abnormalities are seen. A right femoral line is noted. Musculoskeletal: No acute osseous abnormalities are identified. Minimal chronic loss of height is noted at the lower thoracic spine. The visualized musculature is unremarkable in appearance. IMPRESSION: 1. Diffuse distention of small-bowel loops to 6.0 cm in maximal diameter, with a focal transition point at the mid abdomen along the proximal ileum. There is complete decompression of more distal ileal loops. This is concerning for high-grade small bowel obstruction. Underlying mild mesenteric edema noted. 2. Trace bilateral pleural effusions. Patchy bibasilar airspace opacities may reflect pneumonia. 3. Mild  wall thickening at the rectum could reflect mild proctitis. 4. Scattered coronary artery calcifications seen. 5. Scattered bilateral renal cysts noted. Aortic Atherosclerosis (ICD10-I70.0). Electronically Signed   By: Kevin Humphrey M.D.   On: 09/20/2017 04:13   Dg Abd 1 View  Result Date: 09/21/2017 CLINICAL DATA:  Follow-up small bowel obstruction EXAM: ABDOMEN - 1 VIEW COMPARISON:  Yesterday FINDINGS: Nasogastric tube tip overlaps the distal stomach. Unchanged dilated small bowel. Stool seen throughout much of the colon. No visible pneumatosis. No concerning gas collection. Streaky lower lobe opacities, stable from admission CT. IMPRESSION: 1. Unchanged small bowel obstruction. 2. Formed stool seen throughout the colon. 3. Nasogastric tube in good position. Electronically Signed   By: Kevin Humphrey M.D.   On: 09/21/2017 09:01   Dg Abdomen 1 View  Result Date: 09/20/2017 CLINICAL DATA:  67 year old male with high-grade small bowel obstruction. NG tube placement. EXAM: ABDOMEN - 1 VIEW COMPARISON:  KUB 0407 hours today, CT Abdomen and Pelvis 0332 hours  today. FINDINGS: Portable AP view at 0649 hours. Enteric tube is present in the epigastrium with side hole at the level of the proximal gastric body. Dilated gas-filled small bowel loops in the abdomen up to 6 centimeters diameter persist. No definite pneumoperitoneum on this portable view. IMPRESSION: 1. Enteric tube placed into the stomach with side hole at the gastric body. 2. Stable small bowel obstruction. Electronically Signed   By: Genevie Ann M.D.   On: 09/20/2017 07:35   Dg Abdomen 1 View  Result Date: 09/20/2017 CLINICAL DATA:  67 y/o  M; NG tube positioning. EXAM: ABDOMEN - 1 VIEW COMPARISON:  09/20/2017 CT abdomen and pelvis. FINDINGS: Diffuse small bowel dilatation. Enteric tube tip projects over the distal stomach. Moderate degenerative changes of the lumbar spine with mild levocurvature. IMPRESSION: Enteric tube tip projects over distal  stomach. Diffuse small bowel dilatation. Electronically Signed   By: Kevin Humphrey M.D.   On: 09/20/2017 04:26   Dg Chest Port 1 View  Result Date: 09/20/2017 CLINICAL DATA:  Altered mental status EXAM: PORTABLE CHEST 1 VIEW COMPARISON:  December 20, 2014 FINDINGS: Chronic right rib fractures. Cardiomediastinal silhouette is normal. The left lung is clear. Possible mild opacity in the right upper lung. No other acute abnormalities. IMPRESSION: Possible opacity in the right upper lung versus confluence of shadows. Recommend a PA and lateral chest x-ray for better evaluation. Electronically Signed   By: Kevin Humphrey M.D   On: 09/20/2017 00:19   Dg Abd Portable 1v-small Bowel Obstruction Protocol-initial, 8 Hr Delay  Result Date: 09/20/2017 CLINICAL DATA:  8 hour delay has part of a small-bowel obstruction protocol. EXAM: PORTABLE ABDOMEN - 1 VIEW COMPARISON:  09/20/2017 at 6:49 a.m. FINDINGS: The contrast injected through the nasogastric tube lies within the gastric fundus. There is no significant contrast within the small bowel. There is diffuse and significant small bowel dilation consistent without high-grade obstruction. IMPRESSION: 1. No evidence of contrast extending distal to the stomach. 2. Persistent findings of a high-grade small bowel obstruction. Electronically Signed   By: Lajean Manes M.D.   On: 09/20/2017 20:11   Dg Abd Portable 1 View  Result Date: 09/20/2017 CLINICAL DATA:  Nasogastric tube placement. EXAM: PORTABLE ABDOMEN - 1 VIEW COMPARISON:  CT of the abdomen and pelvis from 10/16/2014 FINDINGS: There is diffuse distention of small-bowel loops to 5.5 cm in diameter, raising concern for high-grade small bowel obstruction. Residual stool is noted in the colon. The stomach is partially filled with air. The patient's enteric tube is noted ending overlying the body of the stomach, with the side port about the gastroesophageal junction. No acute osseous abnormalities are  seen. No free intra-abdominal air is seen, though evaluation for free air is limited on a single supine view. A right femoral line is noted. IMPRESSION: 1. Diffuse distention of small-bowel loops to 5.5 cm in diameter, raising concern for high-grade small bowel obstruction. No free intra-abdominal air seen. 2. Enteric tube noted ending overlying the body of the stomach, with the side port about the gastroesophageal junction. These results were called by telephone at the time of interpretation on 09/20/2017 at 1:12 am to Dr. Joseph Humphrey, who verbally acknowledged these results. Electronically Signed   By: Kevin Humphrey M.D.   On: 09/20/2017 01:13    Medications / Allergies: per chart  Antibiotics: Anti-infectives (From admission, onward)   Start     Dose/Rate Route Frequency Ordered Stop   09/21/17 0600  vancomycin (VANCOCIN) IVPB 1000 mg/200 mL  premix  Status:  Discontinued     1,000 mg 200 mL/hr over 60 Minutes Intravenous Every 24 hours 09/20/17 0628 09/20/17 0921   09/21/17 0600  vancomycin (VANCOCIN) IVPB 750 mg/150 ml premix     750 mg 150 mL/hr over 60 Minutes Intravenous Every 24 hours 09/20/17 0921     09/20/17 0800  piperacillin-tazobactam (ZOSYN) IVPB 3.375 g     3.375 g 12.5 mL/hr over 240 Minutes Intravenous Every 8 hours 09/20/17 0612     09/20/17 0130  vancomycin (VANCOCIN) 1,500 mg in sodium chloride 0.9 % 500 mL IVPB     1,500 mg 250 mL/hr over 120 Minutes Intravenous  Once 09/20/17 0100 09/20/17 0453   09/20/17 0100  piperacillin-tazobactam (ZOSYN) IVPB 3.375 g     3.375 g 100 mL/hr over 30 Minutes Intravenous  Once 09/20/17 0047 09/20/17 0214   09/20/17 0100  vancomycin (VANCOCIN) IVPB 1000 mg/200 mL premix  Status:  Discontinued     1,000 mg 200 mL/hr over 60 Minutes Intravenous  Once 09/20/17 0047 09/20/17 0100        Note: Portions of this report may have been transcribed using voice recognition software. Every effort was made to ensure accuracy; however,  inadvertent computerized transcription errors may be present.   Any transcriptional errors that result from this process are unintentional.     Kevin Humphrey, M.D., F.A.C.S. Gastrointestinal and Minimally Invasive Surgery [facility] [address] [place], [place] [id number] [phone] Main / Paging   09/21/2017

## 2017-09-21 NOTE — Progress Notes (Signed)
Palliative Note:  If consent and/or medical information needed please contact [name] with APS. She is the director/legal guardian decision maker for Mr. Bodey. Ms. Blenda Mounts is his assigned APS worker. Cheryl's mobile number is [phone] and her office number is [phone]. She stated she could be reached anytime via cell over the weekend to provide consent for procedures etc.  Alda Lea, NP-BC Palliative Medicine Team  Phone: [phone] Fax: [phone] Cell: [phone] Pager:[phone]

## 2017-09-21 NOTE — Progress Notes (Signed)
Pharmacy Antibiotic Note  Kevin Humphrey is a 67 y.o. male presented to the ED from [place] on 09/19/2017 with hematemesis and was found to have SBO.  Vancomycin and zosyn started on admission for suspected sepsis.  Today, 09/21/2017: - day #2 abx - afeb, WBC 12 - SCr down 1.11 (crcl~67) -- on NS at 125 ml/hr - PCT down 3.06   Plan: - Adjust Vancomycin to 750 mg q12h for AUC 509, scr 1.11 - continue zosyn 3.375 gm IV q8h (infuse over 4 hrs) - Daily Scr  - adjust pepcid to 20 mg IV q12h for renal function  ____________________________________  Height: 5\' 11"  (180.3 cm) Weight: 165 lb 9.1 oz (75.1 kg) IBW/kg (Calculated) : 75.3  Temp (24hrs), Avg:98.4 F (36.9 C), Min:97.6 F (36.4 C), Max:99.2 F (37.3 C)  Recent Labs  Lab 09/19/17 2324 09/19/17 2341 09/20/17 0426 09/20/17 0642 09/20/17 1042 09/21/17 0521  WBC 10.0  --   --  10.7*  --  12.0*  CREATININE 1.65* 1.60*  --  1.82*  --  1.11  LATICACIDVEN  --  2.40* 1.79 2.6* 1.3 1.0    Estimated Creatinine Clearance: 67.7 mL/min (by C-G formula based on SCr of 1.11 mg/dL).    No Known Allergies  Antimicrobials this admission: 5/30 zosyn >>  5/30 vancomycin >>   Microbiology results: 5/30 BCx x1: 5/30 UCx: 5/30 MRSA PCR: pos  Thank you for allowing pharmacy to be a part of this patient's care.  Lynelle Doctor 09/21/2017 9:57 AM

## 2017-09-22 ENCOUNTER — Inpatient Hospital Stay (HOSPITAL_COMMUNITY): Payer: Medicare Other | Admitting: Anesthesiology

## 2017-09-22 ENCOUNTER — Encounter (HOSPITAL_COMMUNITY)
Admission: EM | Disposition: A | Discharge status: Skilled Nursing Facility | Payer: Self-pay | Source: Skilled Nursing Facility | Attending: Internal Medicine

## 2017-09-22 ENCOUNTER — Inpatient Hospital Stay (HOSPITAL_COMMUNITY): Payer: Medicare Other

## 2017-09-22 ENCOUNTER — Encounter (HOSPITAL_COMMUNITY): Payer: Self-pay | Admitting: Certified Registered Nurse Anesthetist

## 2017-09-22 DIAGNOSIS — N390 Urinary tract infection, site not specified: Secondary | ICD-10-CM | POA: Diagnosis present

## 2017-09-22 DIAGNOSIS — A419 Sepsis, unspecified organism: Principal | ICD-10-CM

## 2017-09-22 DIAGNOSIS — E440 Moderate protein-calorie malnutrition: Secondary | ICD-10-CM

## 2017-09-22 DIAGNOSIS — Z66 Do not resuscitate: Secondary | ICD-10-CM

## 2017-09-22 HISTORY — PX: LAPAROSCOPY: SHX197

## 2017-09-22 LAB — CBC WITH DIFFERENTIAL/PLATELET
BASOS ABS: 0 10*3/uL (ref 0.0–0.1)
BASOS PCT: 0 %
EOS PCT: 1 %
Eosinophils Absolute: 0.1 10*3/uL (ref 0.0–0.7)
HCT: 25.1 % — ABNORMAL LOW (ref 39.0–52.0)
Hemoglobin: 8.3 g/dL — ABNORMAL LOW (ref 13.0–17.0)
LYMPHS PCT: 9 %
Lymphs Abs: 1 10*3/uL (ref 0.7–4.0)
MCH: 31.1 pg (ref 26.0–34.0)
MCHC: 33.1 g/dL (ref 30.0–36.0)
MCV: 94 fL (ref 78.0–100.0)
Monocytes Absolute: 1 10*3/uL (ref 0.1–1.0)
Monocytes Relative: 9 %
NEUTROS ABS: 9.2 10*3/uL — AB (ref 1.7–7.7)
Neutrophils Relative %: 81 %
PLATELETS: 207 10*3/uL (ref 150–400)
RBC: 2.67 MIL/uL — AB (ref 4.22–5.81)
RDW: 14.4 % (ref 11.5–15.5)
WBC: 11.3 10*3/uL — AB (ref 4.0–10.5)

## 2017-09-22 LAB — BASIC METABOLIC PANEL
ANION GAP: 7 (ref 5–15)
BUN: 18 mg/dL (ref 6–20)
CO2: 20 mmol/L — ABNORMAL LOW (ref 22–32)
Calcium: 6.7 mg/dL — ABNORMAL LOW (ref 8.9–10.3)
Chloride: 114 mmol/L — ABNORMAL HIGH (ref 101–111)
Creatinine, Ser: 0.8 mg/dL (ref 0.61–1.24)
GFR calc non Af Amer: >60 mL/min (ref >60)
Glucose, Bld: 127 mg/dL — ABNORMAL HIGH (ref 65–99)
POTASSIUM: 3 mmol/L — AB (ref 3.5–5.1)
SODIUM: 141 mmol/L (ref 135–145)

## 2017-09-22 LAB — GLUCOSE, CAPILLARY
GLUCOSE-CAPILLARY: 110 mg/dL — AB (ref 65–99)
GLUCOSE-CAPILLARY: 125 mg/dL — AB (ref 65–99)
GLUCOSE-CAPILLARY: 128 mg/dL — AB (ref 65–99)
GLUCOSE-CAPILLARY: 119 mg/dL — AB (ref 65–99)

## 2017-09-22 LAB — PROCALCITONIN: PROCALCITONIN: 1.06 ng/mL

## 2017-09-22 SURGERY — LAPAROSCOPY, DIAGNOSTIC
Anesthesia: General | Site: Abdomen

## 2017-09-22 MED ORDER — ONDANSETRON HCL 4 MG/2ML IJ SOLN
INTRAMUSCULAR | Status: AC
  Filled 2017-09-22: qty 2

## 2017-09-22 MED ORDER — FENTANYL CITRATE (PF) 100 MCG/2ML IJ SOLN
INTRAMUSCULAR | Status: AC
  Filled 2017-09-22: qty 2

## 2017-09-22 MED ORDER — FENTANYL CITRATE (PF) 100 MCG/2ML IJ SOLN
INTRAMUSCULAR | Status: DC | PRN
  Administered 2017-09-22 (×5): 50 ug via INTRAVENOUS

## 2017-09-22 MED ORDER — LACTATED RINGERS IR SOLN
Status: DC | PRN
  Administered 2017-09-22: 2000 mL

## 2017-09-22 MED ORDER — BUPIVACAINE-EPINEPHRINE (PF) 0.25% -1:200000 IJ SOLN
INTRAMUSCULAR | Status: AC
  Filled 2017-09-22: qty 60

## 2017-09-22 MED ORDER — POTASSIUM CHLORIDE 10 MEQ/100ML IV SOLN
10.0000 meq | INTRAVENOUS | Status: AC
  Administered 2017-09-22 (×3): 10 meq via INTRAVENOUS
  Filled 2017-09-22: qty 100

## 2017-09-22 MED ORDER — POTASSIUM CHLORIDE IN NACL 40-0.9 MEQ/L-% IV SOLN
INTRAVENOUS | Status: DC
  Administered 2017-09-22 – 2017-09-23 (×2): 75 mL/h via INTRAVENOUS
  Filled 2017-09-22 (×3): qty 1000

## 2017-09-22 MED ORDER — ALBUMIN HUMAN 5 % IV SOLN
INTRAVENOUS | Status: AC
  Filled 2017-09-22: qty 1000

## 2017-09-22 MED ORDER — 0.9 % SODIUM CHLORIDE (POUR BTL) OPTIME
TOPICAL | Status: DC | PRN
  Administered 2017-09-22: 1000 mL

## 2017-09-22 MED ORDER — FENTANYL CITRATE (PF) 100 MCG/2ML IJ SOLN
25.0000 ug | INTRAMUSCULAR | Status: DC | PRN
  Administered 2017-09-22: 25 ug via INTRAVENOUS

## 2017-09-22 MED ORDER — POTASSIUM CHLORIDE 10 MEQ/100ML IV SOLN: 10.0000 meq | INTRAVENOUS | Status: DC

## 2017-09-22 MED ORDER — OXYCODONE HCL 5 MG PO TABS: 5.0000 mg | ORAL_TABLET | Freq: Once | ORAL | Status: DC | PRN

## 2017-09-22 MED ORDER — LACTATED RINGERS IV SOLN
INTRAVENOUS | Status: DC | PRN
  Administered 2017-09-22: 11:00:00 via INTRAVENOUS

## 2017-09-22 MED ORDER — SUGAMMADEX SODIUM 500 MG/5ML IV SOLN
INTRAVENOUS | Status: AC
  Filled 2017-09-22: qty 5

## 2017-09-22 MED ORDER — SODIUM CHLORIDE 0.9 % IV SOLN
25.0000 mg | Freq: Four times a day (QID) | INTRAVENOUS | Status: DC | PRN
  Filled 2017-09-22: qty 1

## 2017-09-22 MED ORDER — PROPOFOL 10 MG/ML IV BOLUS
INTRAVENOUS | Status: DC | PRN
  Administered 2017-09-22: 150 mg via INTRAVENOUS

## 2017-09-22 MED ORDER — SODIUM CHLORIDE 0.9 % IJ SOLN
INTRAMUSCULAR | Status: AC
  Filled 2017-09-22: qty 30

## 2017-09-22 MED ORDER — ONDANSETRON HCL 4 MG/2ML IJ SOLN: 4.0000 mg | Freq: Once | INTRAMUSCULAR | Status: DC | PRN

## 2017-09-22 MED ORDER — SODIUM CHLORIDE 0.9 % IV SOLN
INTRAVENOUS | Status: DC | PRN
  Administered 2017-09-22: 10:00:00 via INTRAVENOUS

## 2017-09-22 MED ORDER — PROPOFOL 10 MG/ML IV BOLUS
INTRAVENOUS | Status: AC
  Filled 2017-09-22: qty 20

## 2017-09-22 MED ORDER — STERILE WATER FOR IRRIGATION IR SOLN
Status: DC | PRN
  Administered 2017-09-22: 1000 mL

## 2017-09-22 MED ORDER — FENTANYL CITRATE (PF) 250 MCG/5ML IJ SOLN
INTRAMUSCULAR | Status: AC
  Filled 2017-09-22: qty 5

## 2017-09-22 MED ORDER — SUGAMMADEX SODIUM 500 MG/5ML IV SOLN
INTRAVENOUS | Status: DC | PRN
  Administered 2017-09-22: 300 mg via INTRAVENOUS

## 2017-09-22 MED ORDER — DEXAMETHASONE SODIUM PHOSPHATE 10 MG/ML IJ SOLN
INTRAMUSCULAR | Status: DC | PRN
  Administered 2017-09-22: 5 mg via INTRAVENOUS

## 2017-09-22 MED ORDER — PROCHLORPERAZINE EDISYLATE 10 MG/2ML IJ SOLN
5.0000 mg | INTRAMUSCULAR | Status: DC | PRN
  Administered 2017-09-25: 5 mg via INTRAVENOUS
  Filled 2017-09-22: qty 2

## 2017-09-22 MED ORDER — ROCURONIUM BROMIDE 10 MG/ML (PF) SYRINGE
PREFILLED_SYRINGE | INTRAVENOUS | Status: DC | PRN
  Administered 2017-09-22: 10 mg via INTRAVENOUS
  Administered 2017-09-22: 50 mg via INTRAVENOUS

## 2017-09-22 MED ORDER — SODIUM CHLORIDE 0.9 % IV SOLN
2.0000 g | INTRAVENOUS | Status: AC
  Administered 2017-09-22: 2 g via INTRAVENOUS
  Filled 2017-09-22: qty 2

## 2017-09-22 MED ORDER — SUCCINYLCHOLINE CHLORIDE 20 MG/ML IJ SOLN
INTRAMUSCULAR | Status: DC | PRN
  Administered 2017-09-22: 120 mg via INTRAVENOUS

## 2017-09-22 MED ORDER — OXYCODONE HCL 5 MG/5ML PO SOLN
5.0000 mg | Freq: Once | ORAL | Status: DC | PRN
  Filled 2017-09-22: qty 5

## 2017-09-22 MED ORDER — MIDAZOLAM HCL 2 MG/2ML IJ SOLN
INTRAMUSCULAR | Status: AC
  Filled 2017-09-22: qty 2

## 2017-09-22 MED ORDER — ALBUMIN HUMAN 5 % IV SOLN
INTRAVENOUS | Status: DC | PRN
  Administered 2017-09-22: 12:00:00 via INTRAVENOUS

## 2017-09-22 MED ORDER — BUPIVACAINE-EPINEPHRINE 0.25% -1:200000 IJ SOLN
INTRAMUSCULAR | Status: DC | PRN
  Administered 2017-09-22: 50 mL

## 2017-09-22 MED ORDER — LACTATED RINGERS IV SOLN
INTRAVENOUS | Status: DC | PRN
  Administered 2017-09-22 (×2): via INTRAVENOUS

## 2017-09-22 MED ORDER — LIDOCAINE 2% (20 MG/ML) 5 ML SYRINGE
INTRAMUSCULAR | Status: DC | PRN
  Administered 2017-09-22: 60 mg via INTRAVENOUS

## 2017-09-22 MED ORDER — LACTATED RINGERS IV BOLUS
1000.0000 mL | Freq: Three times a day (TID) | INTRAVENOUS | Status: AC | PRN
  Administered 2017-09-22: 1000 mL via INTRAVENOUS

## 2017-09-22 MED ORDER — CHLORHEXIDINE GLUCONATE CLOTH 2 % EX PADS: 6.0000 | MEDICATED_PAD | Freq: Once | CUTANEOUS | Status: DC

## 2017-09-22 MED ORDER — SODIUM CHLORIDE 0.9 % IV SOLN: INTRAVENOUS | Status: DC

## 2017-09-22 MED ORDER — SODIUM CHLORIDE 0.9 % IJ SOLN
INTRAMUSCULAR | Status: AC
  Filled 2017-09-22: qty 10

## 2017-09-22 SURGICAL SUPPLY — 35 items
CABLE HIGH FREQUENCY MONO STRZ (ELECTRODE) ×3 IMPLANT
COVER SURGICAL LIGHT HANDLE (MISCELLANEOUS) ×3 IMPLANT
DECANTER SPIKE VIAL GLASS SM (MISCELLANEOUS) ×3 IMPLANT
DRAPE UTILITY XL STRL (DRAPES) ×3 IMPLANT
DRAPE WARM FLUID 44X44 (DRAPE) ×3 IMPLANT
DRSG TEGADERM 2-3/8X2-3/4 SM (GAUZE/BANDAGES/DRESSINGS) ×12 IMPLANT
DRSG TEGADERM 4X4.75 (GAUZE/BANDAGES/DRESSINGS) IMPLANT
ELECT REM PT RETURN 15FT ADLT (MISCELLANEOUS) ×3 IMPLANT
GAUZE SPONGE 2X2 8PLY STRL LF (GAUZE/BANDAGES/DRESSINGS) ×1 IMPLANT
GLOVE ECLIPSE 8.0 STRL XLNG CF (GLOVE) ×3 IMPLANT
GLOVE INDICATOR 8.0 STRL GRN (GLOVE) ×3 IMPLANT
GOWN STRL REUS W/TWL XL LVL3 (GOWN DISPOSABLE) ×9 IMPLANT
IRRIG SUCT STRYKERFLOW 2 WTIP (MISCELLANEOUS) ×3
IRRIGATION SUCT STRKRFLW 2 WTP (MISCELLANEOUS) ×1 IMPLANT
KIT BASIN OR (CUSTOM PROCEDURE TRAY) ×3 IMPLANT
PAD POSITIONING PINK XL (MISCELLANEOUS) ×3 IMPLANT
POSITIONER SURGICAL ARM (MISCELLANEOUS) IMPLANT
SCISSORS LAP 5X35 DISP (ENDOMECHANICALS) ×3 IMPLANT
SEALER TISSUE G2 STRG ARTC 35C (ENDOMECHANICALS) IMPLANT
SLEEVE XCEL OPT CAN 5 100 (ENDOMECHANICALS) ×6 IMPLANT
SPONGE GAUZE 2X2 STER 10/PKG (GAUZE/BANDAGES/DRESSINGS) ×2
SUT MNCRL AB 4-0 PS2 18 (SUTURE) ×3 IMPLANT
SUT SILK 2 0 (SUTURE) ×2
SUT SILK 2 0 SH CR/8 (SUTURE) IMPLANT
SUT SILK 2-0 18XBRD TIE 12 (SUTURE) ×1 IMPLANT
SUT SILK 3 0 (SUTURE)
SUT SILK 3 0 SH CR/8 (SUTURE) ×3 IMPLANT
SUT SILK 3-0 18XBRD TIE 12 (SUTURE) IMPLANT
TOWEL OR 17X26 10 PK STRL BLUE (TOWEL DISPOSABLE) ×3 IMPLANT
TOWEL OR NON WOVEN STRL DISP B (DISPOSABLE) ×3 IMPLANT
TRAY FOLEY MTR SLVR 16FR STAT (SET/KITS/TRAYS/PACK) IMPLANT
TRAY LAPAROSCOPIC (CUSTOM PROCEDURE TRAY) ×3 IMPLANT
TROCAR BLADELESS OPT 5 100 (ENDOMECHANICALS) ×3 IMPLANT
TROCAR XCEL NON-BLD 11X100MML (ENDOMECHANICALS) IMPLANT
TUBING INSUF HEATED (TUBING) ×3 IMPLANT

## 2017-09-22 NOTE — Discharge Instructions (Signed)
SURGERY: POST OP INSTRUCTIONS °(Surgery for small bowel obstruction, colon resection, etc) ° ° °###################################################################### ° °EAT °Gradually transition to a high fiber diet with a fiber supplement over the next few days after discharge ° °WALK °Walk an hour a day.  Control your pain to do that.   ° °CONTROL PAIN °Control pain so that you can walk, sleep, tolerate sneezing/coughing, go up/down stairs. ° °HAVE A BOWEL MOVEMENT DAILY °Keep your bowels regular to avoid problems.  OK to try a laxative to override constipation.  OK to use an antidairrheal to slow down diarrhea.  Call if not better after 2 tries ° °CALL IF YOU HAVE PROBLEMS/CONCERNS °Call if you are still struggling despite following these instructions. °Call if you have concerns not answered by these instructions ° °###################################################################### ° ° °DIET °Follow a light diet the first few days at home.  Start with a bland diet such as soups, liquids, starchy foods, low fat foods, etc.  If you feel full, bloated, or constipated, stay on a ful liquid or pureed/blenderized diet for a few days until you feel better and no longer constipated. °Be sure to drink plenty of fluids every day to avoid getting dehydrated (feeling dizzy, not urinating, etc.). °Gradually add a fiber supplement to your diet over the next week.  Gradually get back to a regular solid diet.  Avoid fast food or heavy meals the first week as you are more likely to get nauseated. °It is expected for your digestive tract to need a few months to get back to normal.  It is common for your bowel movements and stools to be irregular.  You will have occasional bloating and cramping that should eventually fade away.  Until you are eating solid food normally, off all pain medications, and back to regular activities; your bowels will not be normal. °Focus on eating a low-fat, high fiber diet the rest of your life  (See Getting to Good Bowel Health, below). ° °CARE of your INCISION or WOUND °It is good for closed incision and even open wounds to be washed every day.  Shower every day.  Short baths are fine.  Wash the incisions and wounds clean with soap & water.    °If you have a closed incision(s), wash the incision with soap & water every day.  You may leave closed incisions open to air if it is dry.   You may cover the incision with clean gauze & replace it after your daily shower for comfort. °If you have skin tapes (Steristrips) or skin glue (Dermabond) on your incision, leave them in place.  They will fall off on their own like a scab.  You may trim any edges that curl up with clean scissors.  If you have staples, set up an appointment for them to be removed in the office in 10 days after surgery.  °If you have a drain, wash around the skin exit site with soap & water and place a new dressing of gauze or band aid around the skin every day.  Keep the drain site clean & dry.    °If you have an open wound with packing, see wound care instructions.  In general, it is encouraged that you remove your dressing and packing, shower with soap & water, and replace your dressing once a day.  Pack the wound with clean gauze moistened with normal (0.9%) saline to keep the wound moist & uninfected.  Pressure on the dressing for 30 minutes will stop most wound   bleeding.  Eventually your body will heal & pull the open wound closed over the next few months.  °Raw open wounds will occasionally bleed or secrete yellow drainage until it heals closed.  Drain sites will drain a little until the drain is removed.  Even closed incisions can have mild bleeding or drainage the first few days until the skin edges scab over & seal.   °If you have an open wound with a wound vac, see wound vac care instructions. ° ° ° ° °ACTIVITIES as tolerated °Start light daily activities --- self-care, walking, climbing stairs-- beginning the day after surgery.   Gradually increase activities as tolerated.  Control your pain to be active.  Stop when you are tired.  Ideally, walk several times a day, eventually an hour a day.   °Most people are back to most day-to-day activities in a few weeks.  It takes 4-8 weeks to get back to unrestricted, intense activity. °If you can walk 30 minutes without difficulty, it is safe to try more intense activity such as jogging, treadmill, bicycling, low-impact aerobics, swimming, etc. °Save the most intensive and strenuous activity for last (Usually 4-8 weeks after surgery) such as sit-ups, heavy lifting, contact sports, etc.  Refrain from any intense heavy lifting or straining until you are off narcotics for pain control.  You will have off days, but things should improve week-by-week. °DO NOT PUSH THROUGH PAIN.  Let pain be your guide: If it hurts to do something, don't do it.  Pain is your body warning you to avoid that activity for another week until the pain goes down. °You may drive when you are no longer taking narcotic prescription pain medication, you can comfortably wear a seatbelt, and you can safely make sudden turns/stops to protect yourself without hesitating due to pain. °You may have sexual intercourse when it is comfortable. If it hurts to do something, stop. ° °MEDICATIONS °Take your usually prescribed home medications unless otherwise directed.   °Blood thinners:  °Usually you can restart any strong blood thinners after the second postoperative day.  It is OK to take aspirin right away.    ° If you are on strong blood thinners (warfarin/Coumadin, Plavix, Xerelto, Eliquis, Pradaxa, etc), discuss with your surgeon, medicine PCP, and/or cardiologist for instructions on when to restart the blood thinner & if blood monitoring is needed (PT/INR blood check, etc).   ° ° °PAIN CONTROL °Pain after surgery or related to activity is often due to strain/injury to muscle, tendon, nerves and/or incisions.  This pain is usually  short-term and will improve in a few months.  °To help speed the process of healing and to get back to regular activity more quickly, DO THE FOLLOWING THINGS TOGETHER: °1. Increase activity gradually.  DO NOT PUSH THROUGH PAIN °2. Use Ice and/or Heat °3. Try Gentle Massage and/or Stretching °4. Take over the counter pain medication °5. Take Narcotic prescription pain medication for more severe pain ° °Good pain control = faster recovery.  It is better to take more medicine to be more active than to stay in bed all day to avoid medications. °1.  Increase activity gradually °Avoid heavy lifting at first, then increase to lifting as tolerated over the next 6 weeks. °Do not “push through” the pain.  Listen to your body and avoid positions and maneuvers than reproduce the pain.  Wait a few days before trying something more intense °Walking an hour a day is encouraged to help your body recover faster   and more safely.  Start slowly and stop when getting sore.  If you can walk 30 minutes without stopping or pain, you can try more intense activity (running, jogging, aerobics, cycling, swimming, treadmill, sex, sports, weightlifting, etc.) °Remember: If it hurts to do it, then don’t do it! °2. Use Ice and/or Heat °You will have swelling and bruising around the incisions.  This will take several weeks to resolve. °Ice packs or heating pads (6-8 times a day, 30-60 minutes at a time) will help sooth soreness & bruising. °Some people prefer to use ice alone, heat alone, or alternate between ice & heat.  Experiment and see what works best for you.  Consider trying ice for the first few days to help decrease swelling and bruising; then, switch to heat to help relax sore spots and speed recovery. °Shower every day.  Short baths are fine.  It feels good!  Keep the incisions and wounds clean with soap & water.   °3. Try Gentle Massage and/or Stretching °Massage at the area of pain many times a day °Stop if you feel pain - do not  overdo it °4. Take over the counter pain medication °This helps the muscle and nerve tissues become less irritable and calm down faster °Choose ONE of the following over-the-counter anti-inflammatory medications: °Acetaminophen 500mg tabs (Tylenol) 1-2 pills with every meal and just before bedtime (avoid if you have liver problems or if you have acetaminophen in you narcotic prescription) °Naproxen 220mg tabs (ex. Aleve, Naprosyn) 1-2 pills twice a day (avoid if you have kidney, stomach, IBD, or bleeding problems) °Ibuprofen 200mg tabs (ex. Advil, Motrin) 3-4 pills with every meal and just before bedtime (avoid if you have kidney, stomach, IBD, or bleeding problems) °Take with food/snack several times a day as directed for at least 2 weeks to help keep pain / soreness down & more manageable. °5. Take Narcotic prescription pain medication for more severe pain °A prescription for strong pain control is often given to you upon discharge (for example: oxycodone/Percocet, hydrocodone/Norco/Vicodin, or tramadol/Ultram) °Take your pain medication as prescribed. °Be mindful that most narcotic prescriptions contain Tylenol (acetaminophen) as well - avoid taking too much Tylenol. °If you are having problems/concerns with the prescription medicine (does not control pain, nausea, vomiting, rash, itching, etc.), please call us [phone] to see if we need to switch you to a different pain medicine that will work better for you and/or control your side effects better. °If you need a refill on your pain medication, you must call the office before 4 pm and on weekdays only.  By federal law, prescriptions for narcotics cannot be called into a pharmacy.  They must be filled out on paper & picked up from our office by the patient or authorized caretaker.  Prescriptions cannot be filled after 4 pm nor on weekends.   ° °WHEN TO CALL US [phone] °Severe uncontrolled or worsening pain  °Fever over 101 F (38.5 C) °Concerns with  the incision: Worsening pain, redness, rash/hives, swelling, bleeding, or drainage °Reactions / problems with new medications (itching, rash, hives, nausea, etc.) °Nausea and/or vomiting °Difficulty urinating °Difficulty breathing °Worsening fatigue, dizziness, lightheadedness, blurred vision °Other concerns °If you are not getting better after two weeks or are noticing you are getting worse, contact our office [phone] for further advice.  We may need to adjust your medications, re-evaluate you in the office, send you to the emergency room, or see what other things we can do to help. °The   clinic staff is available to answer your questions during regular business hours (8:30am-5pm).  Please don’t hesitate to call and ask to speak to one of our nurses for clinical concerns.    °A surgeon from [facility] is always on call at the hospitals 24 hours/day °If you have a medical emergency, go to the nearest emergency room or call 911. ° °FOLLOW UP in our office °One the day of your discharge from the hospital (or the next business weekday), please call [facility] to set up or confirm an appointment to see your surgeon in the office for a follow-up appointment.  Usually it is 2-3 weeks after your surgery.   °If you have skin staples at your incision(s), let the office know so we can set up a time in the office for the nurse to remove them (usually around 10 days after surgery). °Make sure that you call for appointments the day of discharge (or the next business weekday) from the hospital to ensure a convenient appointment time. °IF YOU HAVE DISABILITY OR FAMILY LEAVE FORMS, BRING THEM TO THE OFFICE FOR PROCESSING.  DO NOT GIVE THEM TO YOUR DOCTOR. ° °[facility] °[address], [address], [place], [place]  [address] ? °[phone] - Main °[phone] - Toll Free,  [phone] - Fax °[email or web address] ° °GETTING TO GOOD BOWEL HEALTH. °It is  expected for your digestive tract to need a few months to get back to normal.  It is common for your bowel movements and stools to be irregular.  You will have occasional bloating and cramping that should eventually fade away.  Until you are eating solid food normally, off all pain medications, and back to regular activities; your bowels will not be normal.   °Avoiding constipation °The goal: ONE SOFT BOWEL MOVEMENT A DAY!    °Drink plenty of fluids.  Choose water first. °TAKE A FIBER SUPPLEMENT EVERY DAY THE REST OF YOUR LIFE °During your first week back home, gradually add back a fiber supplement every day °Experiment which form you can tolerate.   There are many forms such as powders, tablets, wafers, gummies, etc °Psyllium bran (Metamucil), methylcellulose (Citrucel), Miralax or Glycolax, Benefiber, Flax Seed.  °Adjust the dose week-by-week (1/2 dose/day to 6 doses a day) until you are moving your bowels 1-2 times a day.  Cut back the dose or try a different fiber product if it is giving you problems such as diarrhea or bloating. °Sometimes a laxative is needed to help jump-start bowels if constipated until the fiber supplement can help regulate your bowels.  If you are tolerating eating & you are farting, it is okay to try a gentle laxative such as double dose MiraLax, prune juice, or Milk of Magnesia.  Avoid using laxatives too often. °Stool softeners can sometimes help counteract the constipating effects of narcotic pain medicines.  It can also cause diarrhea, so avoid using for too long. °If you are still constipated despite taking fiber daily, eating solids, and a few doses of laxatives, call our office. °Controlling diarrhea °Try drinking liquids and eating bland foods for a few days to avoid stressing your intestines further. °Avoid dairy products (especially milk & ice cream) for a short time.  The intestines often can lose the ability to digest lactose when stressed. °Avoid foods that cause gassiness or  bloating.  Typical foods include beans and other legumes, cabbage, broccoli, and dairy foods.  Avoid greasy, spicy, fast foods.  Every person has   some sensitivity to other foods, so listen to your body and avoid those foods that trigger problems for you. °Probiotics (such as active yogurt, Align, etc) may help repopulate the intestines and colon with normal bacteria and calm down a sensitive digestive tract °Adding a fiber supplement gradually can help thicken stools by absorbing excess fluid and retrain the intestines to act more normally.  Slowly increase the dose over a few weeks.  Too much fiber too soon can backfire and cause cramping & bloating. °It is okay to try and slow down diarrhea with a few doses of antidiarrheal medicines.   °Bismuth subsalicylate (ex. Kayopectate, Pepto Bismol) for a few doses can help control diarrhea.  Avoid if pregnant.   °Loperamide (Imodium) can slow down diarrhea.  Start with one tablet (2mg) first.  Avoid if you are having fevers or severe pain.  °ILEOSTOMY PATIENTS WILL HAVE CHRONIC DIARRHEA since their colon is not in use.    °Drink plenty of liquids.  You will need to drink even more glasses of water/liquid a day to avoid getting dehydrated. °Record output from your ileostomy.  Expect to empty the bag every 3-4 hours at first.  Most people with a permanent ileostomy empty their bag 4-6 times at the least.   °Use antidiarrheal medicine (especially Imodium) several times a day to avoid getting dehydrated.  Start with a dose at bedtime & breakfast.  Adjust up or down as needed.  Increase antidiarrheal medications as directed to avoid emptying the bag more than 8 times a day (every 3 hours). °Work with your wound ostomy nurse to learn care for your ostomy.  See ostomy care instructions. °TROUBLESHOOTING IRREGULAR BOWELS °1) Start with a soft & bland diet. No spicy, greasy, or fried foods.  °2) Avoid gluten/wheat or dairy products from diet to see if symptoms improve. °3) Miralax  17gm or flax seed mixed in 8oz. water or juice-daily. May use 2-4 times a day as needed. °4) Gas-X, Phazyme, etc. as needed for gas & bloating.  °5) Prilosec (omeprazole) over-the-counter as needed °6)  Consider probiotics (Align, Activa, etc) to help calm the bowels down ° °Call your doctor if you are getting worse or not getting better.  Sometimes further testing (cultures, endoscopy, X-ray studies, CT scans, bloodwork, etc.) may be needed to help diagnose and treat the cause of the diarrhea. °[facility] °[address], [address], [place], [place]  [address] °[phone] - Main.    °[phone]  - Toll Free.   [phone] - Fax °[email or web address] ° ° °

## 2017-09-22 NOTE — Plan of Care (Signed)
  Problem: Pain Managment: Goal: General experience of comfort will improve Outcome: Progressing   Problem: Safety: Goal: Ability to remain free from injury will improve Outcome: Progressing   Problem: Skin Integrity: Goal: Risk for impaired skin integrity will decrease Outcome: Progressing   

## 2017-09-22 NOTE — Anesthesia Preprocedure Evaluation (Addendum)
Anesthesia Evaluation  Patient identified by MRN, date of birth, ID band Patient confused    Reviewed: Allergy & Precautions, NPO status , Patient's Chart, lab work & pertinent test results, Unable to perform ROS - Chart review only  Airway Mallampati: II  TM Distance: >3 FB Neck ROM: Full    Dental  (+) Dental Advisory Given   Pulmonary pneumonia, unresolved, former smoker,    breath sounds clear to auscultation       Cardiovascular hypertension, Pt. on medications  Rhythm:Regular Rate:Normal     Neuro/Psych Seizures -,  PSYCHIATRIC DISORDERS Anxiety Depression Schizophrenia Dementia    GI/Hepatic Neg liver ROS, GERD  ,SBO   Endo/Other  negative endocrine ROS  Renal/GU AKI improved s/p fluid resuscitation  negative genitourinary   Musculoskeletal negative musculoskeletal ROS (+)   Abdominal   Peds  Hematology  (+) anemia ,   Anesthesia Other Findings Hypocalcemia, hypokalemia  Reproductive/Obstetrics                            Anesthesia Physical Anesthesia Plan  ASA: III and emergent  Anesthesia Plan: General   Post-op Pain Management:    Induction: Intravenous  PONV Risk Score and Plan: 4 or greater and Treatment may vary due to age or medical condition, Ondansetron and Dexamethasone  Airway Management Planned: Oral ETT and Video Laryngoscope Planned  Additional Equipment: None  Intra-op Plan:   Post-operative Plan: Possible Post-op intubation/ventilation  Informed Consent: I have reviewed the patients History and Physical, chart, labs and discussed the procedure including the risks, benefits and alternatives for the proposed anesthesia with the patient or authorized representative who has indicated his/her understanding and acceptance.   Dental advisory given  Plan Discussed with: CRNA and Anesthesiologist  Anesthesia Plan Comments: (Despite SBO, will forego RSI given hx  of difficult airway. Patient receiving pepcid. NGT to be on suction during induction. Will preoxygenate and induce patient in reverse trendelenburg to prevent passive regurgitation. Difficult airway equipment in the OR, prepped for use.)       Anesthesia Quick Evaluation

## 2017-09-22 NOTE — Evaluation (Signed)
Clinical/Bedside Swallow Evaluation Patient Details  Name: Kevin Humphrey MRN: [id number] Date of Birth: 08-Apr-1951  Today's Date: 09/22/2017 Time: SLP Start Time (ACUTE ONLY): 0272 SLP Stop Time (ACUTE ONLY): 1550 SLP Time Calculation (min) (ACUTE ONLY): 20 min  Past Medical History:  Past Medical History:  Diagnosis Date  . Allergic rhinitis 08/02/2012  . Depression 08/02/2012  . Dysphagia, unspecified 08/02/2012  . GERD (gastroesophageal reflux disease) 08/02/2012  . Other and unspecified hyperlipidemia 08/02/2012  . Schizophrenia ([place]) 08/02/2012  . Vertebral compression fracture ([place]) 08/02/2012   Past Surgical History:  Past Surgical History:  Procedure Laterality Date  . EXPLORATORY LAPAROTOMY     ??? (Has large midline incision)   HPI:  Patient is a 67 y.o. male with PMH: advanced dementia, schizophrenia, HTN, GERD, depression, dysphagia, who was brought to hospital from SNF secondary to persistent nausea with hematemesis. Chest x-ray at SNF showed some infiltrates and abdomen x-ray concering for bowel obstruction. Patient diagnosed at hospital with sepsis likely secondary to aspiration PNA, anemia, HTN, high-grade small bowel obstruction. on 6/1, he had laproscopy diagnositic lysis of adhesions.    Assessment / Plan / Recommendation Clinical Impression  Patient only accepted a very small bolus of puree (about size of a couple grains of rice) and he did not attempt to manipulate it in oral cavity. SLP eventually had to remove it from mouth. Patient then refused all other attempts from SLP for boluses of water (via spoon and straw). Per RN, patient drank liquids via straw sips yesterday and did not exhibit any overt s/s of aspiration or penetration. Unfortunately, patient's cognitive deficits are resulting in him refusing PO's, and therefore unable to safely recommend any PO at this time.  SLP Visit Diagnosis: Dysphagia, oropharyngeal phase (R13.12)    Aspiration Risk  Severe  aspiration risk;Risk for inadequate nutrition/hydration    Diet Recommendation NPO   Medication Administration: Via alternative means    Other  Recommendations Oral Care Recommendations: Oral care QID(patient is refusing oral care)   Follow up Recommendations        Frequency and Duration min 3x week  2 weeks       Prognosis Prognosis for Safe Diet Advancement: Fair Barriers to Reach Goals: Cognitive deficits      Swallow Study   General Date of Onset: 09/19/17 HPI: Patient is a 67 y.o. male with PMH: advanced dementia, schizophrenia, HTN, GERD, depression, dysphagia, who was brought to hospital from SNF secondary to persistent nausea with hematemesis. Chest x-ray at SNF showed some infiltrates and abdomen x-ray concering for bowel obstruction. Patient diagnosed at hospital with sepsis likely secondary to aspiration PNA, anemia, HTN, high-grade small bowel obstruction. on 6/1, he had laproscopy diagnositic lysis of adhesions.  Type of Study: Bedside Swallow Evaluation Previous Swallow Assessment: N/A Diet Prior to this Study: NPO Temperature Spikes Noted: No Respiratory Status: Nasal cannula History of Recent Intubation: No Behavior/Cognition: Alert;Confused;Lethargic/Drowsy;Requires cueing;Distractible Oral Cavity Assessment: Dried secretions Oral Care Completed by SLP: Other (Comment)(patient would not allow SLP to complete oral care in back of oral cavity) Self-Feeding Abilities: Total assist Patient Positioning: Upright in bed Baseline Vocal Quality: Low vocal intensity Volitional Cough: Cognitively unable to elicit Volitional Swallow: Unable to elicit    Oral/Motor/Sensory Function Overall Oral Motor/Sensory Function: Other (comment)(difficult to assess as patient refused to follow all commands. He did not fully open mouth. )   Ice Chips     Thin Liquid Thin Liquid: Not tested Other Comments: Patient refused    Nectar Thick  Nectar Thick Liquid: Not tested   Honey  Thick Honey Thick Liquid: Not tested   Puree Puree: Impaired Oral Phase Impairments: Reduced lingual movement/coordination Other Comments: Patient licked at 1/2 teaspoon of puree (applesauce) and accepted a very tiny bite but then refused any further. He did not attempt to manipulate puree in mouth and it remained in front of oral cavity until clinician removed it.    Solid   GO   Solid: Not tested       Sonia Baller, MA, CCC-SLP 09/22/17 4:17 PM

## 2017-09-22 NOTE — Anesthesia Procedure Notes (Signed)
Procedure Name: Intubation Performed by: Gean Maidens, CRNA Pre-anesthesia Checklist: Patient identified, Emergency Drugs available, Suction available, Patient being monitored and Timeout performed Patient Re-evaluated:Patient Re-evaluated prior to induction Oxygen Delivery Method: Circle system utilized Preoxygenation: Pre-oxygenation with 100% oxygen Induction Type: IV induction Ventilation: Mask ventilation without difficulty Laryngoscope Size: 4 and Glidescope Grade View: Grade I Tube type: Oral Tube size: 7.5 mm Number of attempts: 1 Airway Equipment and Method: Video-laryngoscopy and Stylet Placement Confirmation: ETT inserted through vocal cords under direct vision,  positive ETCO2,  CO2 detector and breath sounds checked- equal and bilateral Secured at: 23 cm Tube secured with: Tape Dental Injury: Teeth and Oropharynx as per pre-operative assessment

## 2017-09-22 NOTE — Progress Notes (Signed)
PROGRESS NOTE    Kevin Humphrey  WNU:[id number] DOB: [birth year] DOA: 09/19/2017 PCP: Kevin Iba, MD    Brief Narrative:  Kevin Humphrey is a 67 y.o. male with history of advanced dementia, schizophrenia, hypertension was brought from the skilled nursing facility after patient was found to have persistent nausea vomiting with hematemesis.  Chest x-ray done at this nursing facility was showing some infiltrates and abdomen x-ray was showing concerning for bowel obstruction.  ED Course: In the ER stool for occult blood was positive.  Abdomen was distended and initially patient on arrival was confused and minimally responsive but somewhat improved after starting antibiotics and fluids.  CT abdomen pelvis shows bowel obstruction with transition point.  Chest x-ray was showing possibility of infiltrates.  Patient was hypotensive improved with fluids and empiric antibiotic started for sepsis likely from aspiration     Assessment & Plan:   Principal Problem:   SBO (small bowel obstruction) (HCC) Active Problems:   Schizophrenia (HCC)   Malnutrition of moderate degree (HCC)   ARF (acute renal failure) (HCC)   Hematemesis   Sepsis (HCC)   Anxiety state   Small bowel obstruction (HCC)   Hypomagnesemia   Acute lower UTI  #1 high-grade small bowel obstruction Questionable etiology.  No flatus.  RN patient with 2 hard stools this morning.  Abdomen is distended with some bowel sounds noted.  Patient still with complaints of diffuse abdominal pain.  NG tube output of about 360 cc over the past 24 hours.  Serial abdominal films with no significant improvement with high-grade small bowel obstruction.  Patient being followed by general surgery and patient to the operating room today.  Keep potassium greater than 4.  Keep magnesium greater than 2.  Supportive care.  Per general surgery.   2.  Acute renal failure Likely secondary to prerenal azotemia in the setting of ACE inhibitor.  Patient  noted on admission to be hypotensive.  Patient hydrated aggressively with IV fluids.  Renal function improving and trending down.  Urine output slowly improving.  Continue to hold ACE inhibitor and nephrotoxins.  Follow.  3.  Sepsis likely secondary to aspiration pneumonia and deep diphtheroids UTI Per CT abdomen and pelvis as well as chest x-ray concerning for probable pneumonia.  Patient on admission noted to be septic with hypotension, acute renal failure, tachycardia, elevated lactic acid level, elevated procalcitonin level.  Lactic acid level and pro calcitonin levels trending down.  Urine cultures with > 100000 colonies of diphtheroids.  Blood cultures pending with no growth to date.  Change IV fluids to normal saline with 40 mEq of potassium at 100 cc/h.  Discontinue IV vancomycin.  Continue IV Zosyn.    4.  Anemia/hematemesis Likely dilutional in nature.  Patient on admission was noted to have some hematemesis however no hematemesis noted during the hospitalization.  Patient with no overt bleeding.  Anemia panel has been checked and iron level at 11.  Ferritin at 142.  Vitamin B12 levels at 282.  Continue vitamin B12 IM injections.  Hemoglobin currently at 8.3 from 9.8 from 13.7.  We will give a dose of IV Feraheme tomorrow.   Follow H&H transfusion threshold hemoglobin less than 8.  Will likely need oral iron supplementation on discharge.  Continue PPI.  Follow.  5.  Hypertension On admission patient noted to be septic with hypotension.  Pressure improved.  Antihypertensive medications on hold.  Follow.   6.  Schizophrenia Continue IV Depakote.  IV Haldol as needed.  7.  Hypomagnesemia/hypokalemia Secondary to GI losses via NG tube.  Magnesium repleted.  Give 4 rounds of IV KCl 10 mEq and add potassium to IV fluids.  Keep potassium greater than 4.  Keep magnesium greater than 2.      DVT prophylaxis: SCDs Code Status: DNR Family Communication: Updated patient.  No family at  bedside. Disposition Plan: Remain in stepdown unit.  Likely back to nursing facility once medically stable and resolution of small bowel obstruction.   Consultants:   General surgery: Dr. Marcello Moores 09/20/2017  Procedures:   CT abdomen and pelvis 09/20/2017  Chest x-ray 09/20/2017  Abdominal films 09/20/2017, 09/22/2017  Antimicrobials:  IV Zosyn 09/20/2017  IV vancomycin 09/20/2017>>>>>> 09/22/2017   Subjective: NG tube in place.  Patient still with complaints of diffuse abdominal pain.  Per RN patient with 2 hard stools this morning.  Patient denies any shortness of breath.  No chest pain.  Patient states ready for surgery.   Objective: Vitals:   09/22/17 0600 09/22/17 0700 09/22/17 0800 09/22/17 0918  BP: (!) 149/56  (!) 145/71 (!) 142/65  Pulse: 85 88  88  Resp: (!) 27 (!) 26 (!) 27 (!) 31  Temp:      TempSrc:      SpO2: 97% 97%  94%  Weight:      Height:        Intake/Output Summary (Last 24 hours) at 09/22/2017 0934 Last data filed at 09/22/2017 0926 Gross per 24 hour  Intake 2241.58 ml  Output 600 ml  Net 1641.58 ml   Filed Weights   09/20/17 0730 09/21/17 0500  Weight: 72.7 kg (160 lb 4.4 oz) 75.1 kg (165 lb 9.1 oz)    Examination:  General exam: NGT in place. Respiratory system: Some bibasilar crackles. Respiratory effort normal. Cardiovascular system: Regular rate rhythm no murmurs rubs or gallops.  No JVD.  No lower extremity edema.  Gastrointestinal system: Abdomen is softer, distended, positive bowel sounds, diffuse tenderness to palpation.  No rebound.  No guarding.  Central nervous system: Alert and oriented. No focal neurological deficits. Extremities: Symmetric 5 x 5 power. Skin: No rashes, lesions or ulcers Psychiatry: Judgement and insight appear normal. Mood & affect appropriate.     Data Reviewed: I have personally reviewed following labs and imaging studies  CBC: Recent Labs  Lab 09/19/17 2324 09/19/17 2341 09/20/17 0642 09/21/17 0521  09/22/17 0743  WBC 10.0  --  10.7* 12.0* 11.3*  NEUTROABS 8.2*  --  8.6* 9.6* 9.2*  HGB 15.6 16.3 13.7 9.8* 8.3*  HCT 44.8 48.0 39.3 29.3* 25.1*  MCV 92.0  --  92.0 92.7 94.0  PLT 420*  --  350 241 161   Basic Metabolic Panel: Recent Labs  Lab 09/19/17 2324 09/19/17 2341 09/20/17 0642 09/21/17 0521 09/22/17 0743  NA 131* 130* 136 140 141  K 4.5 4.4 4.5 3.9 3.0*  CL 90* 91* 96* 108 114*  CO2 25  --  27 27 20*  GLUCOSE 173* 170* 138* 109* 127*  BUN 25* 24* 33* 26* 18  CREATININE 1.65* 1.60* 1.82* 1.11 0.80  CALCIUM 9.4  --  8.4* 7.8* 6.7*  MG  --   --  1.5* 2.5*  --    GFR: Estimated Creatinine Clearance: 93.9 mL/min (by C-G formula based on SCr of 0.8 mg/dL). Liver Function Tests: Recent Labs  Lab 09/19/17 2324 09/20/17 0642  AST 24 27  ALT 19 21  ALKPHOS 66 51  BILITOT 0.6 0.7  PROT 7.9  6.6  ALBUMIN 4.1 3.4*   Recent Labs  Lab 09/19/17 2324  LIPASE 19   Recent Labs  Lab 09/20/17 0642  AMMONIA 24   Coagulation Profile: Recent Labs  Lab 09/20/17 0642  INR 1.14  1.06   Cardiac Enzymes: Recent Labs  Lab 09/19/17 2324 09/20/17 0642 09/20/17 1245 09/20/17 1843  TROPONINI <0.03 0.03* 0.03* <0.03   BNP (last 3 results) No results for input(s): PROBNP in the last 8760 hours. HbA1C: Recent Labs    09/20/17 0652  HGBA1C 4.9   CBG: Recent Labs  Lab 09/21/17 0631 09/21/17 1137 09/21/17 1807 09/21/17 2334 09/22/17 0540  GLUCAP 134* 94 120* 99 110*   Lipid Profile: No results for input(s): CHOL, HDL, LDLCALC, TRIG, CHOLHDL, LDLDIRECT in the last 72 hours. Thyroid Function Tests: No results for input(s): TSH, T4TOTAL, FREET4, T3FREE, THYROIDAB in the last 72 hours. Anemia Panel: Recent Labs    09/21/17 0811  VITAMINB12 282  FOLATE 24.3  FERRITIN 142  TIBC 196*  IRON 11*   Sepsis Labs: Recent Labs  Lab 09/20/17 0426 09/20/17 0642 09/20/17 1042 09/21/17 0521 09/22/17 0743  PROCALCITON  --  4.83  --  3.06 1.06  LATICACIDVEN 1.79  2.6* 1.3 1.0  --     Recent Results (from the past 240 hour(s))  Blood Culture (routine x 2)     Status: None (Preliminary result)   Collection Time: 09/19/17 11:15 PM  Result Value Ref Range Status   Specimen Description   Final    BLOOD LEFT ARM UPPER Performed at [facility] [address] [place], [place] [address]    Special Requests   Final    BOTTLES DRAWN AEROBIC AND ANAEROBIC Blood Culture adequate volume Performed at [facility] [address] [place], [place] [address]    Culture   Final    NO GROWTH 1 DAY Performed at [facility], [place] [address] [place], [place] [address]    Report Status PENDING  Incomplete  Urine culture     Status: Abnormal   Collection Time: 09/20/17  1:28 AM  Result Value Ref Range Status   Specimen Description   Final    URINE, CATHETERIZED Performed at [facility] [address], [place], [place] [address]    Special Requests   Final    NONE Performed at [facility], [place] [address], [place], [place] [address]    Culture (A)  Final    >=100,000 COLONIES/mL DIPHTHEROIDS(CORYNEBACTERIUM SPECIES)   Report Status 09/21/2017 FINAL  Final  MRSA PCR Screening     Status: Abnormal   Collection Time: 09/20/17 12:37 PM  Result Value Ref Range Status   MRSA by PCR POSITIVE (A) NEGATIVE Final    Comment:        The GeneXpert MRSA Assay (FDA approved for NASAL specimens only), is one component of a comprehensive MRSA colonization surveillance program. It is not intended to diagnose MRSA infection nor to guide or monitor treatment for MRSA infections. RESULT CALLED TO, READ BACK BY AND VERIFIED WITH: Alcide Clever [id number] @ 6384 BY J SCOTTON Performed at [facility] [address] [place], [place] [address]          Radiology Studies: Dg Abd 1 View  Result Date: 09/21/2017 CLINICAL DATA:  Follow-up small bowel  obstruction EXAM: ABDOMEN - 1 VIEW COMPARISON:  Yesterday FINDINGS: Nasogastric tube tip overlaps the distal stomach. Unchanged dilated small bowel. Stool seen  throughout much of the colon. No visible pneumatosis. No concerning gas collection. Streaky lower lobe opacities, stable from admission CT. IMPRESSION: 1. Unchanged small bowel obstruction. 2. Formed stool seen throughout the colon. 3. Nasogastric tube in good position. Electronically Signed   By: Monte Fantasia M.D.   On: 09/21/2017 09:01   Dg Abd Portable 1v-small Bowel Obstruction Protocol-initial, 8 Hr Delay  Result Date: 09/20/2017 CLINICAL DATA:  8 hour delay has part of a small-bowel obstruction protocol. EXAM: PORTABLE ABDOMEN - 1 VIEW COMPARISON:  09/20/2017 at 6:49 a.m. FINDINGS: The contrast injected through the nasogastric tube lies within the gastric fundus. There is no significant contrast within the small bowel. There is diffuse and significant small bowel dilation consistent without high-grade obstruction. IMPRESSION: 1. No evidence of contrast extending distal to the stomach. 2. Persistent findings of a high-grade small bowel obstruction. Electronically Signed   By: Lajean Manes M.D.   On: 09/20/2017 20:11   Dg Abd Portable 2v  Result Date: 09/22/2017 CLINICAL DATA:  Followup small bowel obstruction. EXAM: PORTABLE ABDOMEN - 2 VIEW COMPARISON:  09/21/2017 and older exams. FINDINGS: There are persistent dilated loops of small bowel, similar to prior exams, consistent with a high-grade partial obstruction. Air in stool is seen within a nondilated colon. IMPRESSION: 1. Persistent high-grade small bowel obstruction. No significant change in small bowel distension when compared to the prior studies. Electronically Signed   By: Lajean Manes M.D.   On: 09/22/2017 07:33        Scheduled Meds: . Chlorhexidine Gluconate Cloth  6 each Topical Q0600  . Chlorhexidine Gluconate Cloth  6 each Topical Once  . cyanocobalamin  1,000 mcg  Intramuscular Daily  . mouth rinse  15 mL Mouth Rinse BID  . mupirocin ointment  1 application Nasal BID   Continuous Infusions: . cefoTEtan (CEFOTAN) IV    . famotidine (PEPCID) IV Stopped (09/22/17 0926)  . piperacillin-tazobactam (ZOSYN)  IV 3.375 g (09/22/17 0844)  . potassium chloride    . sodium chloride 0.9 % 1,000 mL with potassium chloride 40 mEq infusion    . valproate sodium Stopped (09/22/17 0738)  . vancomycin Stopped (09/22/17 1610)     LOS: 2 days    Time spent: 35 minutes    Irine Seal, MD [facility] Pager [phone]  If 7PM-7AM, please contact night-coverage [email or web address] Password [facility] 09/22/2017, 9:34 AM

## 2017-09-22 NOTE — Progress Notes (Signed)
Pt arrived into Phase I PACU post op with bilateral mittens on and soft wrist safety devices in place. Due to pt hx, pt restless at times, unable to follow commands at times safety devices continued in PACU, Surgeon aware of safety devices in place and aware that the mittens and wrist restraints will remain in place until pt more alert. Skin area assessed frequently along with circulation of Rt and Lt hand. Attempted to communicate with pt upon arrival and frequently while pt in PACU, other safety measures such as sr x 4 up and bed in lowest position and RN remaining at bedside. Anesthesiologist also aware of need for safety devices

## 2017-09-22 NOTE — Progress Notes (Signed)
[facility]  [address] [place], [place] [address] Phone: [phone]  FAX: [phone]      Kevin Humphrey [id number] 07-22-1950  CARE TEAM:  PCP: [name], MD  Outpatient Care Team: Patient Care Team: Duffy Bruce, Manya Silvas, MD as PCP - General (Internal Medicine)  Inpatient Treatment Team: Treatment Team: Attending Provider: Eugenie Filler, MD; Rounding Team: Redmond Baseman, MD; Consulting Physician: Edison Pace, Md, MD   Problem List:   Principal Problem:   SBO (small bowel obstruction) [facility]) Active Problems:   Schizophrenia ([place])   Anxiety state   Malnutrition of moderate degree ([place])   ARF (acute renal failure) ([place])   Hematemesis   Sepsis ([place])   Small bowel obstruction ([place])   Hypomagnesemia      * No surgery found *     Assessment  SBO most likely due to adhesions from prior laparotomy incision of unknown purpose and a demented schizophrenic patient had a skilled nursing facility.   Plan:  -Hemodynamically the patient is better.  However, if he has not opened up at 48 hours, so standard care is operative exploration.  He did pass a few stool balls but his x-ray shows that his small bowel is still blocked up with a high-grade bowel obstruction.    Try laparoscopically versus laparotomy with lysis adhesions to find transition point that seems to be in the central retroperitoneum.  The anatomy & physiology of the digestive tract was discussed.  The pathophysiology of perforation was discussed.  Differential diagnosis such as perforated ulcer or colon, etc was discussed.   Natural history risks without surgery such as death was discussed.  I recommended abdominal exploration to diagnose & treat the source of the problem.  Laparoscopic & open techniques were discussed.   Risks such as bleeding, infection, abscess, leak, reoperation, bowel resection, possible ostomy, injury to other organs, need for  repair of tissues / organs, hernia, heart attack, death, and other risks were discussed.   The risks of no intervention will lead to serious problems including death.   I expressed a good likelihood that surgery will address the problem.    Goals of post-operative recovery were discussed as well.  We will work to minimize complications although risks in an emergent setting are high.   Questions were answered.  The patient expressed understanding & wishes to proceed with surgery.  As opposed yesterday where he was anxious and scared, he understands and is ready to proceed.  With his schizophrenia and dementia, do not feel he can give formal consent but this have been discussed with his legal guardian decision-maker.  Consent has been signed.  I reached out again this morning to clarify that we are proceeding.       -Continue nasogastric tube decompression. -VTE prophylaxis- SCDs, etc -mobilize as tolerated to help recovery    25 minutes spent in review, evaluation, examination, counseling, and coordination of care.  More than 50% of that time was spent in counseling.  Adin Hector, M.D., F.A.C.S. Gastrointestinal and Minimally Invasive Surgery [facility] [address] [place] [address] [place], [place] [address] [phone] Main / Paging   09/22/2017    Subjective: (Chief complaint)  Patient passed 2 stool balls but still with abdominal pain and thick NG tube output.  Nursing in room.  Events noted about reaching out to legal guardian who wishes to be aggressive for the patient and proceed with surgery if needed.  Consent has been signed.  Patient himself ready for surgery and says so  Objective:  Vital signs:  Vitals:   09/22/17 0400 09/22/17 0500 09/22/17 0600 09/22/17 0700  BP: (!) 131/56  (!) 149/56   Pulse: 83 88 85 88  Resp: (!) 28 (!) 34 (!) 27 (!) 26  Temp:      TempSrc:      SpO2: 93% 93% 97% 97%  Weight:      Height:        Last BM  Date: (prior to admission)  Intake/Output   Yesterday:  05/31 0701 - 06/01 0700 In: 2261.6 [I.V.:1347.1; NG/GT:360; IV Piggyback:554.5] Out: 600 [Urine:400; Emesis/NG output:200] This shift:  No intake/output data recorded.  Bowel function:  Flatus: No  BM:  No  Drain: Bilious NGT   Physical Exam:  General: Pt awakens in mild acute distress Eyes: PERRL, normal EOM.  Sclera clear.  No icterus Neuro: CN II-XII intact w/o focal sensory/motor deficits. Lymph: No head/neck/groin lymphadenopathy Psych: Anxious.  Confused.  Not following commands.  Will not interact. HENT: Normocephalic, Mucus membranes moist.  No thrush Neck: Supple, No tracheal deviation Chest: No chest wall pain w good excursion CV:  Pulses intact.  Regular rhythm MS: Normal AROM mjr joints.  No obvious deformity  Abdomen: Soft.  Moderately distended.  Nontender.  No evidence of peritonitis.  No incarcerated hernias.  Ext:  No deformity.  No mjr edema.  No cyanosis Skin: No petechiae / purpura  Results:   Labs: Results for orders placed or performed during the hospital encounter of 09/19/17 (from the past 48 hour(s))  Lactic acid, plasma     Status: None   Collection Time: 09/20/17 10:42 AM  Result Value Ref Range   Lactic Acid, Venous 1.3 0.5 - 1.9 mmol/L    Comment: Performed at [facility], [place] [address], [place], [place] [address]  Glucose, capillary     Status: Abnormal   Collection Time: 09/20/17 11:51 AM  Result Value Ref Range   Glucose-Capillary 106 (H) 65 - 99 mg/dL  MRSA PCR Screening     Status: Abnormal   Collection Time: 09/20/17 12:37 PM  Result Value Ref Range   MRSA by PCR POSITIVE (A) NEGATIVE    Comment:        The GeneXpert MRSA Assay (FDA approved for NASAL specimens only), is one component of a comprehensive MRSA colonization surveillance program. It is not intended to diagnose MRSA infection nor to guide or monitor treatment for MRSA  infections. RESULT CALLED TO, READ BACK BY AND VERIFIED WITH: Alcide Clever [id number] @ [phone] BY J SCOTTON Performed at [facility] [address], [place], [place] [address]   Troponin I (q 6hr x 3)     Status: Abnormal   Collection Time: 09/20/17 12:45 PM  Result Value Ref Range   Troponin I 0.03 (HH) <0.03 ng/mL    Comment: CRITICAL VALUE NOTED.  VALUE IS CONSISTENT WITH PREVIOUSLY REPORTED AND CALLED VALUE. Performed at [facility], [place] [address], [place], [place] [address]   Glucose, capillary     Status: Abnormal   Collection Time: 09/20/17  5:25 PM  Result Value Ref Range   Glucose-Capillary 100 (H) 65 - 99 mg/dL  Sodium, urine, random     Status: None   Collection Time: 09/20/17  5:31 PM  Result Value Ref Range   Sodium, Ur 39 mmol/L    Comment: Performed at [facility], [address] [place], [place] [address]  Creatinine, urine, random     Status: None   Collection Time: 09/20/17  5:31 PM  Result Value Ref Range   Creatinine, Urine 144.93 mg/dL    Comment: Performed at [facility], [place] [address] [place], [place] [address]  Troponin I (q 6hr x 3)     Status: None   Collection Time: 09/20/17  6:43 PM  Result Value Ref Range   Troponin I <0.03 <0.03 ng/mL    Comment: Performed at [facility], [place] [address], [place], [place] [address]  Glucose, capillary     Status: Abnormal   Collection Time: 09/20/17 11:48 PM  Result Value Ref Range   Glucose-Capillary 107 (H) 65 - 99 mg/dL   Comment 1 Notify RN    Comment 2 Document in Chart   Lactic acid, plasma     Status: None   Collection Time: 09/21/17  5:21 AM  Result Value Ref Range   Lactic Acid, Venous 1.0 0.5 - 1.9 mmol/L    Comment: Performed at [facility], [place] [address], [place], [place] [address]  Magnesium     Status: Abnormal   Collection Time: 09/21/17  5:21 AM  Result Value Ref Range    Magnesium 2.5 (H) 1.7 - 2.4 mg/dL    Comment: Performed at [facility], [place] [address] [place], [place] [address]  Procalcitonin     Status: None   Collection Time: 09/21/17  5:21 AM  Result Value Ref Range   Procalcitonin 3.06 ng/mL    Comment:        Interpretation: PCT > 2 ng/mL: Systemic infection (sepsis) is likely, unless other causes are known. (NOTE)       Sepsis PCT Algorithm           Lower Respiratory Tract                                      Infection PCT Algorithm    ----------------------------     ----------------------------         PCT < 0.25 ng/mL                PCT < 0.10 ng/mL         Strongly encourage             Strongly discourage   discontinuation of antibiotics    initiation of antibiotics    ----------------------------     -----------------------------       PCT 0.25 - 0.50 ng/mL            PCT 0.10 - 0.25 ng/mL               OR       >80% decrease in PCT            Discourage initiation of                                            antibiotics      Encourage discontinuation           of antibiotics    ----------------------------     -----------------------------         PCT >= 0.50 ng/mL  PCT 0.26 - 0.50 ng/mL               AND       <80% decrease in PCT              Encourage initiation of                                             antibiotics       Encourage continuation           of antibiotics    ----------------------------     -----------------------------        PCT >= 0.50 ng/mL                  PCT > 0.50 ng/mL               AND         increase in PCT                  Strongly encourage                                      initiation of antibiotics    Strongly encourage escalation           of antibiotics                                     -----------------------------                                           PCT <= 0.25 ng/mL                                                 OR                                         > 80% decrease in PCT                                     Discontinue / Do not initiate                                             antibiotics Performed at [facility] [address], [place], [place] [address]   CBC with Differential/Platelet     Status: Abnormal   Collection Time: 09/21/17  5:21 AM  Result Value Ref Range   WBC 12.0 (H) 4.0 - 10.5 K/uL   RBC 3.16 (L) 4.22 - 5.81 MIL/uL   Hemoglobin 9.8 (L) 13.0 - 17.0 g/dL    Comment: REPEATED TO VERIFY DELTA CHECK NOTED    HCT 29.3 (L) 39.0 -  52.0 %   MCV 92.7 78.0 - 100.0 fL   MCH 31.0 26.0 - 34.0 pg   MCHC 33.4 30.0 - 36.0 g/dL   RDW 14.2 11.5 - 15.5 %   Platelets 241 150 - 400 K/uL   Neutrophils Relative % 80 %   Neutro Abs 9.6 (H) 1.7 - 7.7 K/uL   Lymphocytes Relative 10 %   Lymphs Abs 1.2 0.7 - 4.0 K/uL   Monocytes Relative 10 %   Monocytes Absolute 1.2 (H) 0.1 - 1.0 K/uL   Eosinophils Relative 0 %   Eosinophils Absolute 0.0 0.0 - 0.7 K/uL   Basophils Relative 0 %   Basophils Absolute 0.0 0.0 - 0.1 K/uL    Comment: Performed at [facility], [place] [address] [place], [place] [address]  Basic metabolic panel     Status: Abnormal   Collection Time: 09/21/17  5:21 AM  Result Value Ref Range   Sodium 140 135 - 145 mmol/L   Potassium 3.9 3.5 - 5.1 mmol/L   Chloride 108 101 - 111 mmol/L   CO2 27 22 - 32 mmol/L   Glucose, Bld 109 (H) 65 - 99 mg/dL   BUN 26 (H) 6 - 20 mg/dL   Creatinine, Ser 1.11 0.61 - 1.24 mg/dL   Calcium 7.8 (L) 8.9 - 10.3 mg/dL   GFR calc non Af Amer >60 >60 mL/min   GFR calc Af Amer >60 >60 mL/min    Comment: (NOTE) The eGFR has been calculated using the CKD EPI equation. This calculation has not been validated in all clinical situations. eGFR's persistently <60 mL/min signify possible Chronic Kidney Disease.    Anion gap 5 5 - 15    Comment: Performed at [facility], [place] [address] [place], [place] [address]   Glucose, capillary     Status: Abnormal   Collection Time: 09/21/17  6:31 AM  Result Value Ref Range   Glucose-Capillary 134 (H) 65 - 99 mg/dL   Comment 1 Notify RN    Comment 2 Document in Chart   Vitamin B12     Status: None   Collection Time: 09/21/17  8:11 AM  Result Value Ref Range   Vitamin B-12 282 180 - 914 pg/mL    Comment: (NOTE) This assay is not validated for testing neonatal or myeloproliferative syndrome specimens for Vitamin B12 levels. Performed at [facility], [place] [address] [place], [place] [address]   Folate     Status: None   Collection Time: 09/21/17  8:11 AM  Result Value Ref Range   Folate 24.3 >5.9 ng/mL    Comment: RESULTS CONFIRMED BY MANUAL DILUTION Performed at [facility] [address] [place], [place] [address]   Iron and TIBC     Status: Abnormal   Collection Time: 09/21/17  8:11 AM  Result Value Ref Range   Iron 11 (L) 45 - 182 ug/dL   TIBC 196 (L) 250 - 450 ug/dL   Saturation Ratios 6 (L) 17.9 - 39.5 %   UIBC 185 ug/dL    Comment: Performed at [facility], [place] [address], [place], [place] [address]  Ferritin     Status: None   Collection Time: 09/21/17  8:11 AM  Result Value Ref Range   Ferritin 142 24 - 336 ng/mL    Comment: Performed at [facility], [place] [address] [place], [place] [address]  Glucose, capillary     Status: None  Collection Time: 09/21/17 11:37 AM  Result Value Ref Range   Glucose-Capillary 94 65 - 99 mg/dL  Glucose, capillary     Status: Abnormal   Collection Time: 09/21/17  6:07 PM  Result Value Ref Range   Glucose-Capillary 120 (H) 65 - 99 mg/dL  Glucose, capillary     Status: None   Collection Time: 09/21/17 11:34 PM  Result Value Ref Range   Glucose-Capillary 99 65 - 99 mg/dL   Comment 1 Notify RN    Comment 2 Document in Chart   Glucose, capillary     Status: Abnormal   Collection Time: 09/22/17  5:40 AM  Result Value Ref  Range   Glucose-Capillary 110 (H) 65 - 99 mg/dL   Comment 1 Notify RN    Comment 2 Document in Chart   Procalcitonin     Status: None   Collection Time: 09/22/17  7:43 AM  Result Value Ref Range   Procalcitonin 1.06 ng/mL    Comment:        Interpretation: PCT > 0.5 ng/mL and <= 2 ng/mL: Systemic infection (sepsis) is possible, but other conditions are known to elevate PCT as well. (NOTE)       Sepsis PCT Algorithm           Lower Respiratory Tract                                      Infection PCT Algorithm    ----------------------------     ----------------------------         PCT < 0.25 ng/mL                PCT < 0.10 ng/mL         Strongly encourage             Strongly discourage   discontinuation of antibiotics    initiation of antibiotics    ----------------------------     -----------------------------       PCT 0.25 - 0.50 ng/mL            PCT 0.10 - 0.25 ng/mL               OR       >80% decrease in PCT            Discourage initiation of                                            antibiotics      Encourage discontinuation           of antibiotics    ----------------------------     -----------------------------         PCT >= 0.50 ng/mL              PCT 0.26 - 0.50 ng/mL                AND       <80% decrease in PCT             Encourage initiation of  antibiotics       Encourage continuation           of antibiotics    ----------------------------     -----------------------------        PCT >= 0.50 ng/mL                  PCT > 0.50 ng/mL               AND         increase in PCT                  Strongly encourage                                      initiation of antibiotics    Strongly encourage escalation           of antibiotics                                     -----------------------------                                           PCT <= 0.25 ng/mL                                                 OR                                         > 80% decrease in PCT                                     Discontinue / Do not initiate                                             antibiotics Performed at [facility] [address] [place], [place] [address]   CBC with Differential/Platelet     Status: Abnormal   Collection Time: 09/22/17  7:43 AM  Result Value Ref Range   WBC 11.3 (H) 4.0 - 10.5 K/uL   RBC 2.67 (L) 4.22 - 5.81 MIL/uL   Hemoglobin 8.3 (L) 13.0 - 17.0 g/dL   HCT 25.1 (L) 39.0 - 52.0 %   MCV 94.0 78.0 - 100.0 fL   MCH 31.1 26.0 - 34.0 pg   MCHC 33.1 30.0 - 36.0 g/dL   RDW 14.4 11.5 - 15.5 %   Platelets 207 150 - 400 K/uL   Neutrophils Relative % 81 %   Neutro Abs 9.2 (H) 1.7 - 7.7 K/uL   Lymphocytes Relative 9 %   Lymphs Abs 1.0 0.7 - 4.0 K/uL   Monocytes Relative 9 %   Monocytes Absolute 1.0 0.1 - 1.0 K/uL   Eosinophils Relative 1 %   Eosinophils Absolute 0.1 0.0 -  0.7 K/uL   Basophils Relative 0 %   Basophils Absolute 0.0 0.0 - 0.1 K/uL    Comment: Performed at [facility], [place] [address] [place], [place] [address]  Basic metabolic panel     Status: Abnormal   Collection Time: 09/22/17  7:43 AM  Result Value Ref Range   Sodium 141 135 - 145 mmol/L   Potassium 3.0 (L) 3.5 - 5.1 mmol/L    Comment: DELTA CHECK NOTED NO VISIBLE HEMOLYSIS    Chloride 114 (H) 101 - 111 mmol/L   CO2 20 (L) 22 - 32 mmol/L   Glucose, Bld 127 (H) 65 - 99 mg/dL   BUN 18 6 - 20 mg/dL   Creatinine, Ser 0.80 0.61 - 1.24 mg/dL   Calcium 6.7 (L) 8.9 - 10.3 mg/dL   GFR calc non Af Amer >60 >60 mL/min   GFR calc Af Amer >60 >60 mL/min    Comment: (NOTE) The eGFR has been calculated using the CKD EPI equation. This calculation has not been validated in all clinical situations. eGFR's persistently <60 mL/min signify possible Chronic Kidney Disease.    Anion gap 7 5 - 15    Comment: Performed at [facility], [place] [address] [place],  [place] [address]    Imaging / Studies: Dg Abd 1 View  Result Date: 09/21/2017 CLINICAL DATA:  Follow-up small bowel obstruction EXAM: ABDOMEN - 1 VIEW COMPARISON:  Yesterday FINDINGS: Nasogastric tube tip overlaps the distal stomach. Unchanged dilated small bowel. Stool seen throughout much of the colon. No visible pneumatosis. No concerning gas collection. Streaky lower lobe opacities, stable from admission CT. IMPRESSION: 1. Unchanged small bowel obstruction. 2. Formed stool seen throughout the colon. 3. Nasogastric tube in good position. Electronically Signed   By: Monte Fantasia M.D.   On: 09/21/2017 09:01   Dg Abd Portable 1v-small Bowel Obstruction Protocol-initial, 8 Hr Delay  Result Date: 09/20/2017 CLINICAL DATA:  8 hour delay has part of a small-bowel obstruction protocol. EXAM: PORTABLE ABDOMEN - 1 VIEW COMPARISON:  09/20/2017 at 6:49 a.m. FINDINGS: The contrast injected through the nasogastric tube lies within the gastric fundus. There is no significant contrast within the small bowel. There is diffuse and significant small bowel dilation consistent without high-grade obstruction. IMPRESSION: 1. No evidence of contrast extending distal to the stomach. 2. Persistent findings of a high-grade small bowel obstruction. Electronically Signed   By: Lajean Manes M.D.   On: 09/20/2017 20:11   Dg Abd Portable 2v  Result Date: 09/22/2017 CLINICAL DATA:  Followup small bowel obstruction. EXAM: PORTABLE ABDOMEN - 2 VIEW COMPARISON:  09/21/2017 and older exams. FINDINGS: There are persistent dilated loops of small bowel, similar to prior exams, consistent with a high-grade partial obstruction. Air in stool is seen within a nondilated colon. IMPRESSION: 1. Persistent high-grade small bowel obstruction. No significant change in small bowel distension when compared to the prior studies. Electronically Signed   By: Lajean Manes M.D.   On: 09/22/2017 07:33    Medications / Allergies: per  chart  Antibiotics: Anti-infectives (From admission, onward)   Start     Dose/Rate Route Frequency Ordered Stop   09/22/17 0900  cefoTEtan (CEFOTAN) 2 g in sodium chloride 0.9 % 100 mL IVPB     2 g 200 mL/hr over 30 Minutes Intravenous On call to O.R. 09/22/17 3474 09/23/17 0559   09/21/17 1700  vancomycin (VANCOCIN) IVPB 750 mg/150 ml premix     750 mg 150 mL/hr over  60 Minutes Intravenous Every 12 hours 09/21/17 0956     09/21/17 0600  vancomycin (VANCOCIN) IVPB 1000 mg/200 mL premix  Status:  Discontinued     1,000 mg 200 mL/hr over 60 Minutes Intravenous Every 24 hours 09/20/17 0628 09/20/17 0921   09/21/17 0600  vancomycin (VANCOCIN) IVPB 750 mg/150 ml premix  Status:  Discontinued     750 mg 150 mL/hr over 60 Minutes Intravenous Every 24 hours 09/20/17 0921 09/21/17 0956   09/20/17 0800  piperacillin-tazobactam (ZOSYN) IVPB 3.375 g     3.375 g 12.5 mL/hr over 240 Minutes Intravenous Every 8 hours 09/20/17 0612     09/20/17 0130  vancomycin (VANCOCIN) 1,500 mg in sodium chloride 0.9 % 500 mL IVPB     1,500 mg 250 mL/hr over 120 Minutes Intravenous  Once 09/20/17 0100 09/20/17 0453   09/20/17 0100  piperacillin-tazobactam (ZOSYN) IVPB 3.375 g     3.375 g 100 mL/hr over 30 Minutes Intravenous  Once 09/20/17 0047 09/20/17 0214   09/20/17 0100  vancomycin (VANCOCIN) IVPB 1000 mg/200 mL premix  Status:  Discontinued     1,000 mg 200 mL/hr over 60 Minutes Intravenous  Once 09/20/17 0047 09/20/17 0100        Note: Portions of this report may have been transcribed using voice recognition software. Every effort was made to ensure accuracy; however, inadvertent computerized transcription errors may be present.   Any transcriptional errors that result from this process are unintentional.     Adin Hector, M.D., F.A.C.S. Gastrointestinal and Minimally Invasive Surgery [facility] [address] [place], [place] [address] [phone] Main /  Paging   09/22/2017

## 2017-09-22 NOTE — Anesthesia Postprocedure Evaluation (Addendum)
Anesthesia Post Note  Patient: Kevin Humphrey  Procedure(s) Performed: LAPAROSCOPY DIAGNOSTIC  LYSIS OF ADHESIONS (N/A Abdomen)     Patient location during evaluation: PACU Anesthesia Type: General Level of consciousness: awake Pain management: pain level controlled Vital Signs Assessment: post-procedure vital signs reviewed and stable Respiratory status: spontaneous breathing, nonlabored ventilation, respiratory function stable and patient connected to nasal cannula oxygen Cardiovascular status: blood pressure returned to baseline and stable Postop Assessment: no apparent nausea or vomiting Anesthetic complications: no    Last Vitals:  Vitals:   09/22/17 1315 09/22/17 1338  BP: (!) 162/81 (!) 164/77  Pulse: 90 84  Resp: (!) 21 20  Temp: 36.9 C   SpO2: 94% 98%    Last Pain:  Vitals:   09/22/17 0918  TempSrc:   PainSc: 3                  Audry Pili

## 2017-09-22 NOTE — Transfer of Care (Signed)
Immediate Anesthesia Transfer of Care Note  Patient: Kevin Humphrey  Procedure(s) Performed: LAPAROSCOPY DIAGNOSTIC  LYSIS OF ADHESIONS (N/A Abdomen)  Patient Location: PACU  Anesthesia Type:General  Level of Consciousness: awake, patient cooperative and responds to stimulation  Airway & Oxygen Therapy: Patient Spontanous Breathing and Patient connected to face mask oxygen  Post-op Assessment: Report given to RN and Post -op Vital signs reviewed and stable  Post vital signs: Reviewed and stable  Last Vitals:  Vitals Value Taken Time  BP 147/83 09/22/2017 12:30 PM  Temp    Pulse 110 09/22/2017 12:29 PM  Resp 21 09/22/2017 12:32 PM  SpO2 92 % 09/22/2017 12:29 PM  Vitals shown include unvalidated device data.  Last Pain:  Vitals:   09/22/17 0918  TempSrc:   PainSc: 3          Complications: No apparent anesthesia complications

## 2017-09-22 NOTE — Progress Notes (Signed)
09/22/17  4462  Spoke with Olivia Mackie in pharmacy and asked if they would send patient's fluid (NS with K+) to PACU. Per Olivia Mackie they would send fluids to PACU.

## 2017-09-22 NOTE — Op Note (Signed)
09/22/2017  PATIENT:  Kevin Humphrey  67 y.o. male  Patient Care Team: Duffy Bruce, Manya Silvas, MD as PCP - General (Internal Medicine)  PRE-OPERATIVE DIAGNOSIS:  Small bowel obstruction  POST-OPERATIVE DIAGNOSIS:  Small bowel obstruction due to adhesions  PROCEDURE:  LAPAROSCOPIC LYSIS OF ADHESIONS  SURGEON:  Adin Hector, MD  ASSISTANT: Nurse   ANESTHESIA:     General Local anesthesia field block: (0.25% bupivacaine)  EBL:  Total I/O In: 1600 [I.V.:1500; IV Piggyback:100] Out: 35 [Urine:10; Blood:25]  Per anesthesia record  Delay start of Pharmacological VTE agent (>24hrs) due to surgical blood loss or risk of bleeding:  no  DRAINS: none   SPECIMEN:  No Specimen  DISPOSITION OF SPECIMEN:  N/A  COUNTS:  YES  PLAN OF CARE: Admit to inpatient   PATIENT DISPOSITION:  PACU - hemodynamically stable.  INDICATION: Elderly patient with dementia and schizophrenia with worsening nausea vomiting and high-grade bowel obstruction.  Resuscitated.  Placed on small bowel protocol but with no improvement after 48 hours of nasogastric tube decompression and persistent obstruction.  Recommendation made for laparoscopic possible open exploration.   The anatomy & physiology of the digestive tract was discussed.  The pathophysiology of perforation was discussed.  Differential diagnosis such as perforated ulcer or colon, etc was discussed.   Natural history risks without surgery such as death was discussed.  I recommended abdominal exploration to diagnose & treat the source of the problem.  Laparoscopic & open techniques were discussed.   Risks such as bleeding, infection, abscess, leak, reoperation, bowel resection, possible ostomy, injury to other organs, need for repair of tissues / organs, hernia, heart attack, death, and other risks were discussed.   The risks of no intervention will lead to serious problems including death.   I expressed a good likelihood that surgery will address the  problem.    Goals of post-operative recovery were discussed as well.  We will work to minimize complications although risks in an emergent setting are high.   Questions were answered.  Patient has been cleared by acting legal guardian [name] with APS.  Patient initially anxious and scared but agrees to proceed with surgery in the hopes of correcting this problem that is failed nonoperative management.   OR FINDINGS: Small bowel obstruction due to dense retroperitoneal adhesions of the proximal ileum to the base of the small bowel mesentery, causing the transition zone.    Numerous dense chronic interloop small bowel adhesions as well as adhesions between bowel and greater omentum.  Moderately dense adhesions of greater omentum to anterior abdominal wall, anterior pelvis, and left colon.  But no transition point there.  No incisional. inguinal, nor umbilical hernias.  DESCRIPTION:   Informed consent was confirmed. The patient underwent general anaesthesia without difficulty. The patient was positioned appropriately. VTE prevention in place. The patient's abdomen was clipped, prepped, & draped in a sterile fashion. Surgical timeout confirmed our plan.  The patient was positioned in reverse Trendelenburg. Abdominal entry was gained using optical entry technique in the left upper abdomen. Entry was clean. I induced carbon dioxide insufflation. Camera inspection revealed no injury. Extra ports were carefully placed under direct laparoscopic visualization.   Patient had moderately dense adhesions of greater omentum involving most of the anterior abdominal wall and anterior pelvis.  These were carefully freed off with focused blunt and primarily sharp cautery scissors.  Adhesions between the greater omentum and small bowel and colon were freed off as well.  Adhesions between greater omentum  and the anterior pelvis was carefully freed off as well.  Greater omentum reflected into the upper abdomen.  I  could see some obviously dilated small bowel with some decompressed distal bowel.  I found the ileocecal region and ran the small bowel proximally from the terminal ileum.  I encountered some moderately dense hairpin like interloop adhesions.  These were carefully freed off with laparoscopic lysis of adhesions.  Eventually came to a part of small bowel in the proximal ileum that was densely adherent to the retroperitoneum.  This seemed to be the transition point.  I carefully freed the small bowel from its adhesions to the retroperitoneum and to the small bowel mesentery and straight it out this region.  This was the obvious transition zone.  Small bowel was markedly dilated proximal to this.  Ran the small bowel more proximally.  Again found a cluster of dense interloop adhesions to descending colon and greater omentum.  We carefully freed all that off and on twisted that region.  Opened it up till it was nice and straight.  Continued proximally where the most proximal 3 feet had no adhesions and came to the ligament of Treitz.  I freed adhesions of greater omentum off the falciform ligament as well.  The greater omentum had a moderately large defect in the center so I transected the greater omentum more distally to result into long tongues of greater omentum along the right and left gutters.  Hemostasis was ensured.  Did irrigation with clear return.  Ran the small bowel 2 more times from the ileocecal region to the ligament of Treitz.  I did meticulous inspection.  Freddrick March off a few more subtle mesenteric another adhesions until it was nice and open and straight.  Saw no evidence of any serosal enterotomy injury.:  Looked fine as well.  Nasogastric tube in a decompressed stomach.    Capnoperitoneum was evacuated. Ports were removed. The skin was closed with Monocryl at the port sites and Steri-Strips on the fascial stitch puncture sites.  Patient is being extubated to go to the recovery room.  I discussed  operative findings, updated the patient's status, discussed probable steps to recovery, and gave postoperative recommendations to the patient's legal guardian, [name] with APS.Marland Kitchen  Recommendations were made.  Questions were answered.  She expressed understanding & appreciation.  Adin Hector, M.D., F.A.C.S. Gastrointestinal and Minimally Invasive Surgery [facility] [address], [place], [place] [address] [phone] Main / Paging  09/22/2017 12:16 PM

## 2017-09-23 ENCOUNTER — Encounter (HOSPITAL_COMMUNITY): Payer: Self-pay | Admitting: Surgery

## 2017-09-23 DIAGNOSIS — K567 Ileus, unspecified: Secondary | ICD-10-CM | POA: Clinically undetermined

## 2017-09-23 DIAGNOSIS — G9341 Metabolic encephalopathy: Secondary | ICD-10-CM | POA: Diagnosis not present

## 2017-09-23 LAB — CBC WITH DIFFERENTIAL/PLATELET
BAND NEUTROPHILS: 2 %
Basophils Absolute: 0 10*3/uL (ref 0.0–0.1)
Basophils Relative: 0 %
Eosinophils Absolute: 0 10*3/uL (ref 0.0–0.7)
Eosinophils Relative: 0 %
HCT: 25.2 % — ABNORMAL LOW (ref 39.0–52.0)
Hemoglobin: 8.9 g/dL — ABNORMAL LOW (ref 13.0–17.0)
Lymphocytes Relative: 6 %
Lymphs Abs: 0.8 10*3/uL (ref 0.7–4.0)
MCH: 32.6 pg (ref 26.0–34.0)
MCHC: 35.3 g/dL (ref 30.0–36.0)
MCV: 92.3 fL (ref 78.0–100.0)
MONOS PCT: 5 %
MYELOCYTES: 1 %
Monocytes Absolute: 0.6 10*3/uL (ref 0.1–1.0)
Neutro Abs: 11.1 10*3/uL — ABNORMAL HIGH (ref 1.7–7.7)
Neutrophils Relative %: 86 %
PLATELETS: 260 10*3/uL (ref 150–400)
RBC: 2.73 MIL/uL — ABNORMAL LOW (ref 4.22–5.81)
RDW: 14.7 % (ref 11.5–15.5)
WBC: 12.5 10*3/uL — ABNORMAL HIGH (ref 4.0–10.5)

## 2017-09-23 LAB — GLUCOSE, CAPILLARY
GLUCOSE-CAPILLARY: 90 mg/dL (ref 65–99)
Glucose-Capillary: 91 mg/dL (ref 65–99)
Glucose-Capillary: 91 mg/dL (ref 65–99)
Glucose-Capillary: 86 mg/dL (ref 65–99)

## 2017-09-23 LAB — BASIC METABOLIC PANEL
Anion gap: 8 (ref 5–15)
BUN: 20 mg/dL (ref 6–20)
CHLORIDE: 114 mmol/L — AB (ref 101–111)
CO2: 21 mmol/L — ABNORMAL LOW (ref 22–32)
Calcium: 8 mg/dL — ABNORMAL LOW (ref 8.9–10.3)
Creatinine, Ser: 0.85 mg/dL (ref 0.61–1.24)
GFR calc Af Amer: >60 mL/min (ref >60)
GLUCOSE: 102 mg/dL — AB (ref 65–99)
POTASSIUM: 4.3 mmol/L (ref 3.5–5.1)
Sodium: 143 mmol/L (ref 135–145)

## 2017-09-23 LAB — MAGNESIUM: Magnesium: 1.9 mg/dL (ref 1.7–2.4)

## 2017-09-23 MED ORDER — LACTATED RINGERS IV BOLUS: 1000.0000 mL | Freq: Three times a day (TID) | INTRAVENOUS | Status: AC | PRN

## 2017-09-23 MED ORDER — HYDROMORPHONE HCL 1 MG/ML IJ SOLN
0.5000 mg | INTRAMUSCULAR | Status: DC | PRN
  Administered 2017-09-23: 2 mg via INTRAVENOUS
  Filled 2017-09-23: qty 2

## 2017-09-23 MED ORDER — SODIUM CHLORIDE 0.9 % IV SOLN
INTRAVENOUS | Status: DC
  Administered 2017-09-23: 09:00:00 via INTRAVENOUS
  Filled 2017-09-23 (×2): qty 1000

## 2017-09-23 MED ORDER — MORPHINE SULFATE (PF) 2 MG/ML IV SOLN
0.5000 mg | INTRAVENOUS | Status: DC | PRN
  Administered 2017-09-23 – 2017-09-25 (×6): 0.5 mg via INTRAVENOUS
  Filled 2017-09-23 (×5): qty 1

## 2017-09-23 MED ORDER — FERUMOXYTOL INJECTION 510 MG/17 ML
510.0000 mg | Freq: Once | INTRAVENOUS | Status: AC
  Administered 2017-09-23: 510 mg via INTRAVENOUS
  Filled 2017-09-23 (×2): qty 17

## 2017-09-23 MED ORDER — BISACODYL 10 MG RE SUPP
10.0000 mg | Freq: Every day | RECTAL | Status: DC
  Administered 2017-09-24 – 2017-10-10 (×14): 10 mg via RECTAL
  Filled 2017-09-23 (×14): qty 1

## 2017-09-23 MED ORDER — METHOCARBAMOL 1000 MG/10ML IJ SOLN
1000.0000 mg | Freq: Four times a day (QID) | INTRAVENOUS | Status: DC | PRN
  Administered 2017-09-24 – 2017-09-26 (×4): 1000 mg via INTRAVENOUS
  Filled 2017-09-23 (×7): qty 10

## 2017-09-23 MED ORDER — LACTATED RINGERS IV BOLUS
500.0000 mL | Freq: Once | INTRAVENOUS | Status: AC
  Administered 2017-09-23: 500 mL via INTRAVENOUS

## 2017-09-23 MED ORDER — METOPROLOL TARTRATE 5 MG/5ML IV SOLN
2.5000 mg | Freq: Three times a day (TID) | INTRAVENOUS | Status: DC
  Administered 2017-09-23: 2.5 mg via INTRAVENOUS
  Filled 2017-09-23: qty 5

## 2017-09-23 MED ORDER — KETOROLAC TROMETHAMINE 30 MG/ML IJ SOLN: 30.0000 mg | Freq: Four times a day (QID) | INTRAMUSCULAR | Status: DC | PRN

## 2017-09-23 MED ORDER — METOPROLOL TARTRATE 5 MG/5ML IV SOLN
5.0000 mg | Freq: Three times a day (TID) | INTRAVENOUS | Status: DC
  Administered 2017-09-23 – 2017-10-10 (×50): 5 mg via INTRAVENOUS
  Filled 2017-09-23 (×50): qty 5

## 2017-09-23 MED ORDER — SODIUM CHLORIDE 0.9 % IV SOLN
INTRAVENOUS | Status: DC
Start: 2017-09-23 — End: 2017-09-24
  Administered 2017-09-23: 75 mL/h via INTRAVENOUS

## 2017-09-23 NOTE — Progress Notes (Signed)
PRN hydralazine 10mg  given for b/p 173/75, will continue to monitor

## 2017-09-23 NOTE — Progress Notes (Signed)
PROGRESS NOTE    Drury Ardizzone  OIZ:[id number] DOB: 1950-05-08 DOA: 09/19/2017 PCP: [name], MD    Brief Narrative:  Jabes Primo is a 67 y.o. male with history of advanced dementia, schizophrenia, hypertension was brought from the skilled nursing facility after patient was found to have persistent nausea vomiting with hematemesis.  Chest x-ray done at this nursing facility was showing some infiltrates and abdomen x-ray was showing concerning for bowel obstruction.  ED Course: In the ER stool for occult blood was positive.  Abdomen was distended and initially patient on arrival was confused and minimally responsive but somewhat improved after starting antibiotics and fluids.  CT abdomen pelvis shows bowel obstruction with transition point.  Chest x-ray was showing possibility of infiltrates.  Patient was hypotensive improved with fluids and empiric antibiotic started for sepsis likely from aspiration     Assessment & Plan:   Principal Problem:   SBO (small bowel obstruction) s/p lap adhesiolysis 09/22/2017 Active Problems:   Schizophrenia ([place])   Malnutrition of moderate degree (HCC)   ARF (acute renal failure) (HCC)   Hematemesis   Sepsis ([place])   Anxiety state   Small bowel obstruction (HCC)   Hypomagnesemia   Acute lower UTI   Acute metabolic encephalopathy   Ileus (HCC)  #1 high-grade small bowel obstruction that is post laparoscopic lysis of adhesions 09/22/2017./Ileus With small bowel obstruction secondary to adhesions.  Patient status post laparoscopic lysis of adhesions 09/22/2017 prior [name].  Patient now likely with an ileus with no bowel movements and no flatus and hypoactive bowel sounds.    Patient still with complaints of diffuse abdominal pain earlier this morning per RN and patient given IV pain medication.  NG tube output of about 30 cc over the past 24 hours.  Keep potassium greater than 4.  Keep magnesium greater than 2.  IV fluids.  Supportive  care.  Per general surgery.    2 acute metabolic encephalopathy Likely drug-induced secondary to 2 mg of Dilaudid recently given.  Patient not responding fully to noxious or verbal stimuli.  Patient with a gag reflex.  Patient opens eyelids however eyes are deviated upwards.  Give a dose of lactated Ringer's 500 cc x 1.  Continue IV fluids and follow.  Follow.  3.  Acute renal failure Likely secondary to prerenal azotemia in the setting of ACE inhibitor.  Patient noted on admission to be hypotensive.  Patient hydrated aggressively with IV fluids.  Renal function improved and currently at baseline.  Urine output slowly improving.  Continue to hold ACE inhibitor and avoid nephrotoxins.  Follow.  4.  Sepsis likely secondary to aspiration pneumonia and deep diphtheroids UTI Per CT abdomen and pelvis as well as chest x-ray concerning for probable pneumonia.  Patient on admission noted to be septic with hypotension, acute renal failure, tachycardia, elevated lactic acid level, elevated procalcitonin level.  Lactic acid level and pro calcitonin levels trending down.  Urine cultures with > 100000 colonies of diphtheroids.  Blood cultures pending with no growth to date.  IV vancomycin has been discontinued.  Continue IV Zosyn.  Change IV fluids to normal saline at 75 cc/h.  Follow.  5.  Anemia/hematemesis Likely dilutional in nature.  Patient on admission was noted to have some hematemesis however no hematemesis noted during the hospitalization.  Patient with no overt bleeding.  Anemia panel has been checked and iron level at 11.  Ferritin at 142.  Vitamin B12 levels at 282.  Continue vitamin  B12 IM injections.  Hemoglobin currently at 0.9 from 8.3 from 9.8 from 13.7.  We will give a dose of IV Feraheme today.   Follow H&H transfusion threshold hemoglobin less than 8.  Will likely need oral iron supplementation on discharge.  Continue PPI.  Follow.  6.  Hypertension On admission patient noted to be septic with  hypotension.  Blood pressure improved and somewhat elevated this morning.  Antihypertensive medications on hold secondary to problem #1.  Place on Lopressor 2.5 mg IV every 8 hours.   7.  Schizophrenia Continue IV Depakote.  IV Haldol as needed.  8.  Hypomagnesemia/hypokalemia Secondary to GI losses via NG tube.  Magnesium repleted currently at 1.9.  Potassium at 4.3.  Keep magnesium greater than 2.  Keep potassium greater than 4.     DVT prophylaxis: SCDs Code Status: DNR Family Communication: No family at bedside. Disposition Plan: Remain in stepdown unit.  Likely back to nursing facility once medically stable and resolution of small bowel obstruction.   Consultants:   General surgery: Dr. Marcello Moores 09/20/2017  Procedures:   CT abdomen and pelvis 09/20/2017  Chest x-ray 09/20/2017  Abdominal films 09/20/2017, 09/22/2017  Laparoscopic lysis of adhesions per [name] 09/22/2017  Antimicrobials:  IV Zosyn 09/20/2017  IV vancomycin 09/20/2017>>>>>> 09/22/2017   Subjective: NG tube in place.  Patient obtunded and not responding to verbal or noxious stimuli.  Patient opens eyelids by eyes are deviated upwards.  Patient just received 2 mg of Dilaudid at about 8:30 AM.   Objective: Vitals:   09/23/17 0700 09/23/17 0800 09/23/17 0900 09/23/17 0922  BP: (!) 132/58 (!) 150/58 (!) 179/83 (!) 157/82  Pulse: 77 89 95 (!) 101  Resp: (!) 25 (!) 24 (!) 8 19  Temp:  99.4 F (37.4 C)    TempSrc:  Oral    SpO2: 97% 94% 95% 94%  Weight:      Height:        Intake/Output Summary (Last 24 hours) at 09/23/2017 0932 Last data filed at 09/23/2017 0926 Gross per 24 hour  Intake 4305.42 ml  Output 790 ml  Net 3515.42 ml   Filed Weights   09/20/17 0730 09/21/17 0500 09/23/17 0200  Weight: 72.7 kg (160 lb 4.4 oz) 75.1 kg (165 lb 9.1 oz) 76.3 kg (168 lb 3.4 oz)    Examination:  General exam: NGT in place.  Patient not responding to verbal or noxious stimuli.  Respiratory system: Clear to  auscultation bilaterally anterior lung fields.  No wheezes, no crackles, no rhonchi.  Respiratory effort normal. Cardiovascular system: Tachycardia.  No JVD.  No lower extremity edema.  No murmurs rubs or gallops.  Gastrointestinal system: Abdomen is softer, distended, hypoactive bowel sounds, nontender to palpation, no rebound, no guarding.  Central nervous system: Moving extremities spontaneously.  Not responding to noxious or verbal stimuli.   Extremities: Symmetric 5 x 5 power. Skin: No rashes, lesions or ulcers Psychiatry: Judgement and insight appear normal. Mood & affect appropriate.     Data Reviewed: I have personally reviewed following labs and imaging studies  CBC: Recent Labs  Lab 09/19/17 2324 09/19/17 2341 09/20/17 3818 09/21/17 0521 09/22/17 0743 09/23/17 0343  WBC 10.0  --  10.7* 12.0* 11.3* 12.5*  NEUTROABS 8.2*  --  8.6* 9.6* 9.2* 11.1*  HGB 15.6 16.3 13.7 9.8* 8.3* 8.9*  HCT 44.8 48.0 39.3 29.3* 25.1* 25.2*  MCV 92.0  --  92.0 92.7 94.0 92.3  PLT 420*  --  350 241 207  093   Basic Metabolic Panel: Recent Labs  Lab 09/19/17 2324 09/19/17 2341 09/20/17 0642 09/21/17 0521 09/22/17 0743 09/23/17 0343  NA 131* 130* 136 140 141 143  K 4.5 4.4 4.5 3.9 3.0* 4.3  CL 90* 91* 96* 108 114* 114*  CO2 25  --  27 27 20* 21*  GLUCOSE 173* 170* 138* 109* 127* 102*  BUN 25* 24* 33* 26* 18 20  CREATININE 1.65* 1.60* 1.82* 1.11 0.80 0.85  CALCIUM 9.4  --  8.4* 7.8* 6.7* 8.0*  MG  --   --  1.5* 2.5*  --  1.9   GFR: Estimated Creatinine Clearance: 88.6 mL/min (by C-G formula based on SCr of 0.85 mg/dL). Liver Function Tests: Recent Labs  Lab 09/19/17 2324 09/20/17 0642  AST 24 27  ALT 19 21  ALKPHOS 66 51  BILITOT 0.6 0.7  PROT 7.9 6.6  ALBUMIN 4.1 3.4*   Recent Labs  Lab 09/19/17 2324  LIPASE 19   Recent Labs  Lab 09/20/17 0642  AMMONIA 24   Coagulation Profile: Recent Labs  Lab 09/20/17 0642  INR 1.14  1.06   Cardiac Enzymes: Recent Labs    Lab 09/19/17 2324 09/20/17 0642 09/20/17 1245 09/20/17 1843  TROPONINI <0.03 0.03* 0.03* <0.03   BNP (last 3 results) No results for input(s): PROBNP in the last 8760 hours. HbA1C: No results for input(s): HGBA1C in the last 72 hours. CBG: Recent Labs  Lab 09/22/17 1348 09/22/17 1824 09/22/17 2322 09/23/17 0545 09/23/17 0724  GLUCAP 125* 128* 119* 91 90   Lipid Profile: No results for input(s): CHOL, HDL, LDLCALC, TRIG, CHOLHDL, LDLDIRECT in the last 72 hours. Thyroid Function Tests: No results for input(s): TSH, T4TOTAL, FREET4, T3FREE, THYROIDAB in the last 72 hours. Anemia Panel: Recent Labs    09/21/17 0811  VITAMINB12 282  FOLATE 24.3  FERRITIN 142  TIBC 196*  IRON 11*   Sepsis Labs: Recent Labs  Lab 09/20/17 0426 09/20/17 0642 09/20/17 1042 09/21/17 0521 09/22/17 0743  PROCALCITON  --  4.83  --  3.06 1.06  LATICACIDVEN 1.79 2.6* 1.3 1.0  --     Recent Results (from the past 240 hour(s))  Blood Culture (routine x 2)     Status: None (Preliminary result)   Collection Time: 09/19/17 11:15 PM  Result Value Ref Range Status   Specimen Description   Final    BLOOD LEFT ARM UPPER Performed at [facility] [address] [place], [place] [address]    Special Requests   Final    BOTTLES DRAWN AEROBIC AND ANAEROBIC Blood Culture adequate volume Performed at [facility] [address], [place], [place] [address]    Culture   Final    NO GROWTH 2 DAYS Performed at [facility] [address] [place], [place] [address]    Report Status PENDING  Incomplete  Urine culture     Status: Abnormal   Collection Time: 09/20/17  1:28 AM  Result Value Ref Range Status   Specimen Description   Final    URINE, CATHETERIZED Performed at [facility] [address], [place], [place] [address]    Special Requests   Final    NONE Performed at [facility], [place] [address],  [place], [place] [address]    Culture (A)  Final    >=100,000 COLONIES/mL DIPHTHEROIDS(CORYNEBACTERIUM SPECIES)   Report Status 09/21/2017 FINAL  Final  MRSA PCR Screening  Status: Abnormal   Collection Time: 09/20/17 12:37 PM  Result Value Ref Range Status   MRSA by PCR POSITIVE (A) NEGATIVE Final    Comment:        The GeneXpert MRSA Assay (FDA approved for NASAL specimens only), is one component of a comprehensive MRSA colonization surveillance program. It is not intended to diagnose MRSA infection nor to guide or monitor treatment for MRSA infections. RESULT CALLED TO, READ BACK BY AND VERIFIED WITH: Alcide Clever [id number] @ 6226 BY J SCOTTON Performed at [facility] [address], [place], [place] [address]          Radiology Studies: Dg Abd Portable 2v  Result Date: 09/22/2017 CLINICAL DATA:  Followup small bowel obstruction. EXAM: PORTABLE ABDOMEN - 2 VIEW COMPARISON:  09/21/2017 and older exams. FINDINGS: There are persistent dilated loops of small bowel, similar to prior exams, consistent with a high-grade partial obstruction. Air in stool is seen within a nondilated colon. IMPRESSION: 1. Persistent high-grade small bowel obstruction. No significant change in small bowel distension when compared to the prior studies. Electronically Signed   By: Lajean Manes M.D.   On: 09/22/2017 07:33        Scheduled Meds: . bisacodyl  10 mg Rectal Daily  . Chlorhexidine Gluconate Cloth  6 each Topical Q0600  . cyanocobalamin  1,000 mcg Intramuscular Daily  . mouth rinse  15 mL Mouth Rinse BID  . mupirocin ointment  1 application Nasal BID   Continuous Infusions: . chlorproMAZINE (THORAZINE) IV    . famotidine (PEPCID) IV 20 mg (09/23/17 0925)  . lactated ringers Stopped (09/22/17 1343)  . lactated ringers    . lactated ringers    . methocarbamol (ROBAXIN)  IV    . piperacillin-tazobactam (ZOSYN)  IV 3.375 g (09/23/17 0734)  . sodium chloride 0.9  % 1,000 mL infusion 50 mL/hr at 09/23/17 0926  . valproate sodium Stopped (09/23/17 5625)     LOS: 3 days    Time spent: 40 minutes    Irine Seal, MD [facility] Pager [phone]  If 7PM-7AM, please contact night-coverage [email or web address] Password [facility] 09/23/2017, 9:32 AM

## 2017-09-23 NOTE — Progress Notes (Signed)
Pt able to stand at bedside with walker and 2 person assist less than a minute. Pt sat back down on the bed and stated "Dizzy headed".

## 2017-09-23 NOTE — Progress Notes (Signed)
[facility]  [address] [place], [place] [address] Phone: [phone]  FAX: [phone]      Kevin Humphrey [id number] 21-Feb-1951  CARE TEAM:  PCP: [name], MD  Outpatient Care Team: Patient Care Team: Duffy Bruce, Manya Silvas, MD as PCP - General (Internal Medicine) Michael Boston, MD as Consulting Physician (General Surgery)  Inpatient Treatment Team: Treatment Team: Attending Provider: Eugenie Filler, MD; Rounding Team: Redmond Baseman, MD; Consulting Physician: Edison Pace, Md, MD; Technician: Wynn Maudlin, NT; Registered Nurse: Suzzanne Cloud, RN   Problem List:   Principal Problem:   SBO (small bowel obstruction) s/p lap adhesiolysis 09/22/2017 Active Problems:   Schizophrenia ([place])   Anxiety state   Malnutrition of moderate degree ([place])   ARF (acute renal failure) ([place])   Hematemesis   Sepsis ([place])   Small bowel obstruction ([place])   Hypomagnesemia   Acute lower UTI   1 Day Post-Op    POST-OPERATIVE DIAGNOSIS:  Small bowel obstruction due to adhesions  OR FINDINGS: Small bowel obstruction due to dense retroperitoneal adhesions of the proximal ileum to the base of the small bowel mesentery, causing the transition zone.    Numerous dense chronic interloop small bowel adhesions as well as adhesions between bowel and greater omentum.  Moderately dense adhesions of greater omentum to anterior abdominal wall, anterior pelvis, and left colon.  But no transition point there.  No incisional. inguinal, nor umbilical hernias.  PROCEDURE:  LAPAROSCOPIC LYSIS OF ADHESIONS  SURGEON:  Adin Hector, MD     Assessment  SBO from adhesions from prior laparotomy incision of unknown purpose in a demented schizophrenic patient had a skilled nursing facility.   Plan:  -Hemodynamically the patient is better.  -Nasogastric tube until ileus resolved.  Anticipate 3-10 days postop before this happens.  Most  likely will need to continue restraints so he does not pull this out prematurely  IV fluids.  Consider parenteral TNA nutrition if not opened up by hospital day #7.  With his history of some dysphasia on past admissions, I think it would be of benefit to get a speech therapy SPT evaluation once the nasogastric tube is out and ileus has resolved.    Reasonable to try suppositories and enema to help clean out stool since he seemed chronically constipated.  Improve pain control.  For some reason IV narcotics timed out.  That is restarted.  PRN rectal Tylenol.  Ice pack.  Heating pad.  Robaxin IV for back-up.  VTE prophylaxis with SCDs and Lovenox.  Try mobilizing out of chair.  Get walking  Okay to transfer from floor from surgery standpoint defer to [place] medical service.     25 minutes spent in review, evaluation, examination, counseling, and coordination of care.  More than 50% of that time was spent in counseling.  Adin Hector, M.D., F.A.C.S. Gastrointestinal and Minimally Invasive Surgery [facility] [address], [place], [place] [address] [phone] Main / Paging   09/23/2017    Subjective: (Chief complaint)  Patient intermittently without complaints of pain and then complains of abdominal pain, depending on the nurse.  Fentanyl had been available but timed out.  Tylenol suppositories given.  Stool ball felt with suppository but no bowel movements or flatus.  Objective:  Vital signs:  Vitals:   09/23/17 0400 09/23/17 0500 09/23/17 0600 09/23/17 0700  BP: (!) 158/63  (!) 169/63 (!) 132/58  Pulse: 72 72 61  77  Resp: (!) 24 (!) 21 (!) 22 (!) 25  Temp: 98 F (36.7 C)     TempSrc: Oral     SpO2: 94% 94% 99% 97%  Weight:      Height:        Last BM Date: 09/22/17  Intake/Output   Yesterday:  06/01 0701 - 06/02 0700 In: 4187.9 [I.V.:2986.3; NG/GT:30; IV Piggyback:1171.7] Out: 790 [Urine:605; Emesis/NG output:160;  Blood:25] This shift:  No intake/output data recorded.  Bowel function:  Flatus: No  BM:  No  Drain: Bilious NGT   Physical Exam:  General: Pt awakens in no acute distress Eyes: PERRL, normal EOM.  Sclera clear.  No icterus Neuro: CN II-XII intact w/o focal sensory/motor deficits. Lymph: No head/neck/groin lymphadenopathy Psych: Calm.  More interactive.  This is the best I have seen him so far. HENT: Normocephalic, Mucus membranes moist.  No thrush Neck: Supple, No tracheal deviation Chest: No chest wall pain w good excursion CV:  Pulses intact.  Regular rhythm MS: Normal AROM mjr joints.  No obvious deformity  Abdomen: Soft.  Mildy distended.  Nontender.  Laparoscopic port site dressings clean dry and intact.  No guarding or tenderness to my examination.  No evidence of peritonitis.  No incarcerated hernias.  Ext:  No deformity.  No mjr edema.  No cyanosis Skin: No petechiae / purpura  Results:   Labs: Results for orders placed or performed during the hospital encounter of 09/19/17 (from the past 48 hour(s))  Vitamin B12     Status: None   Collection Time: 09/21/17  8:11 AM  Result Value Ref Range   Vitamin B-12 282 180 - 914 pg/mL    Comment: (NOTE) This assay is not validated for testing neonatal or myeloproliferative syndrome specimens for Vitamin B12 levels. Performed at [facility], [place] [address], [place], [place] [address]   Folate     Status: None   Collection Time: 09/21/17  8:11 AM  Result Value Ref Range   Folate 24.3 >5.9 ng/mL    Comment: RESULTS CONFIRMED BY MANUAL DILUTION Performed at [place] [address] [place], [place] [address]   Iron and TIBC     Status: Abnormal   Collection Time: 09/21/17  8:11 AM  Result Value Ref Range   Iron 11 (L) 45 - 182 ug/dL   TIBC 196 (L) 250 - 450 ug/dL   Saturation Ratios 6 (L) 17.9 - 39.5 %   UIBC 185 ug/dL    Comment: Performed at [facility], [place] [address], [place], [place] [address]  Ferritin     Status: None   Collection Time: 09/21/17  8:11 AM  Result Value Ref Range   Ferritin 142 24 - 336 ng/mL    Comment: Performed at [facility], [place] [address] [place], [place] [address]  Glucose, capillary     Status: None   Collection Time: 09/21/17 11:37 AM  Result Value Ref Range   Glucose-Capillary 94 65 - 99 mg/dL  Glucose, capillary     Status: Abnormal   Collection Time: 09/21/17  6:07 PM  Result Value Ref Range   Glucose-Capillary 120 (H) 65 - 99 mg/dL  Glucose, capillary     Status: None   Collection Time: 09/21/17 11:34 PM  Result Value Ref Range   Glucose-Capillary 99 65 - 99 mg/dL   Comment 1 Notify RN    Comment 2 Document in Chart   Glucose, capillary  Status: Abnormal   Collection Time: 09/22/17  5:40 AM  Result Value Ref Range   Glucose-Capillary 110 (H) 65 - 99 mg/dL   Comment 1 Notify RN    Comment 2 Document in Chart   Procalcitonin     Status: None   Collection Time: 09/22/17  7:43 AM  Result Value Ref Range   Procalcitonin 1.06 ng/mL    Comment:        Interpretation: PCT > 0.5 ng/mL and <= 2 ng/mL: Systemic infection (sepsis) is possible, but other conditions are known to elevate PCT as well. (NOTE)       Sepsis PCT Algorithm           Lower Respiratory Tract                                      Infection PCT Algorithm    ----------------------------     ----------------------------         PCT < 0.25 ng/mL                PCT < 0.10 ng/mL         Strongly encourage             Strongly discourage   discontinuation of antibiotics    initiation of antibiotics    ----------------------------     -----------------------------       PCT 0.25 - 0.50 ng/mL            PCT 0.10 - 0.25 ng/mL               OR       >80% decrease in PCT            Discourage initiation of                                            antibiotics      Encourage discontinuation           of  antibiotics    ----------------------------     -----------------------------         PCT >= 0.50 ng/mL              PCT 0.26 - 0.50 ng/mL                AND       <80% decrease in PCT             Encourage initiation of                                             antibiotics       Encourage continuation           of antibiotics    ----------------------------     -----------------------------        PCT >= 0.50 ng/mL                  PCT > 0.50 ng/mL               AND         increase in PCT  Strongly encourage                                      initiation of antibiotics    Strongly encourage escalation           of antibiotics                                     -----------------------------                                           PCT <= 0.25 ng/mL                                                 OR                                        > 80% decrease in PCT                                     Discontinue / Do not initiate                                             antibiotics Performed at [facility] [address], [place], [place] [address]   CBC with Differential/Platelet     Status: Abnormal   Collection Time: 09/22/17  7:43 AM  Result Value Ref Range   WBC 11.3 (H) 4.0 - 10.5 K/uL   RBC 2.67 (L) 4.22 - 5.81 MIL/uL   Hemoglobin 8.3 (L) 13.0 - 17.0 g/dL   HCT 25.1 (L) 39.0 - 52.0 %   MCV 94.0 78.0 - 100.0 fL   MCH 31.1 26.0 - 34.0 pg   MCHC 33.1 30.0 - 36.0 g/dL   RDW 14.4 11.5 - 15.5 %   Platelets 207 150 - 400 K/uL   Neutrophils Relative % 81 %   Neutro Abs 9.2 (H) 1.7 - 7.7 K/uL   Lymphocytes Relative 9 %   Lymphs Abs 1.0 0.7 - 4.0 K/uL   Monocytes Relative 9 %   Monocytes Absolute 1.0 0.1 - 1.0 K/uL   Eosinophils Relative 1 %   Eosinophils Absolute 0.1 0.0 - 0.7 K/uL   Basophils Relative 0 %   Basophils Absolute 0.0 0.0 - 0.1 K/uL    Comment: Performed at [facility], [place] [address]  [place], [place] [address]  Basic metabolic panel     Status: Abnormal   Collection Time: 09/22/17  7:43 AM  Result Value Ref Range   Sodium 141 135 - 145 mmol/L   Potassium 3.0 (L) 3.5 - 5.1 mmol/L    Comment: DELTA CHECK NOTED NO VISIBLE HEMOLYSIS    Chloride 114 (H) 101 - 111 mmol/L   CO2 20 (L) 22 - 32 mmol/L  Glucose, Bld 127 (H) 65 - 99 mg/dL   BUN 18 6 - 20 mg/dL   Creatinine, Ser 0.80 0.61 - 1.24 mg/dL   Calcium 6.7 (L) 8.9 - 10.3 mg/dL   GFR calc non Af Amer >60 >60 mL/min   GFR calc Af Amer >60 >60 mL/min    Comment: (NOTE) The eGFR has been calculated using the CKD EPI equation. This calculation has not been validated in all clinical situations. eGFR's persistently <60 mL/min signify possible Chronic Kidney Disease.    Anion gap 7 5 - 15    Comment: Performed at [facility], [place] [address], [place], [place] [address]  Glucose, capillary     Status: Abnormal   Collection Time: 09/22/17  1:48 PM  Result Value Ref Range   Glucose-Capillary 125 (H) 65 - 99 mg/dL  Glucose, capillary     Status: Abnormal   Collection Time: 09/22/17  6:24 PM  Result Value Ref Range   Glucose-Capillary 128 (H) 65 - 99 mg/dL  Glucose, capillary     Status: Abnormal   Collection Time: 09/22/17 11:22 PM  Result Value Ref Range   Glucose-Capillary 119 (H) 65 - 99 mg/dL   Comment 1 Notify RN    Comment 2 Document in Chart   Magnesium     Status: None   Collection Time: 09/23/17  3:43 AM  Result Value Ref Range   Magnesium 1.9 1.7 - 2.4 mg/dL    Comment: Performed at [facility], [place] [address] [place], [place] [address]  CBC with Differential/Platelet     Status: Abnormal   Collection Time: 09/23/17  3:43 AM  Result Value Ref Range   WBC 12.5 (H) 4.0 - 10.5 K/uL   RBC 2.73 (L) 4.22 - 5.81 MIL/uL   Hemoglobin 8.9 (L) 13.0 - 17.0 g/dL   HCT 25.2 (L) 39.0 - 52.0 %   MCV 92.3 78.0 - 100.0 fL   MCH 32.6 26.0 - 34.0 pg   MCHC 35.3 30.0 - 36.0 g/dL    RDW 14.7 11.5 - 15.5 %   Platelets 260 150 - 400 K/uL   Neutrophils Relative % 86 %   Lymphocytes Relative 6 %   Monocytes Relative 5 %   Eosinophils Relative 0 %   Basophils Relative 0 %   Band Neutrophils 2 %   Myelocytes 1 %   Neutro Abs 11.1 (H) 1.7 - 7.7 K/uL   Lymphs Abs 0.8 0.7 - 4.0 K/uL   Monocytes Absolute 0.6 0.1 - 1.0 K/uL   Eosinophils Absolute 0.0 0.0 - 0.7 K/uL   Basophils Absolute 0.0 0.0 - 0.1 K/uL   WBC Morphology TOXIC GRANULATION     Comment: DOHLE BODIES Performed at [facility], [place] [address], [place], [place] [address]   Basic metabolic panel     Status: Abnormal   Collection Time: 09/23/17  3:43 AM  Result Value Ref Range   Sodium 143 135 - 145 mmol/L   Potassium 4.3 3.5 - 5.1 mmol/L    Comment: DELTA CHECK NOTED NO VISIBLE HEMOLYSIS    Chloride 114 (H) 101 - 111 mmol/L   CO2 21 (L) 22 - 32 mmol/L   Glucose, Bld 102 (H) 65 - 99 mg/dL   BUN 20 6 - 20 mg/dL   Creatinine, Ser 0.85 0.61 - 1.24 mg/dL   Calcium 8.0 (L) 8.9 - 10.3 mg/dL   GFR calc non Af Amer >60 >60 mL/min   GFR calc Af  Amer >60 >60 mL/min    Comment: (NOTE) The eGFR has been calculated using the CKD EPI equation. This calculation has not been validated in all clinical situations. eGFR's persistently <60 mL/min signify possible Chronic Kidney Disease.    Anion gap 8 5 - 15    Comment: Performed at [facility], [place] [address], [place], [place] [address]  Glucose, capillary     Status: None   Collection Time: 09/23/17  5:45 AM  Result Value Ref Range   Glucose-Capillary 91 65 - 99 mg/dL   Comment 1 Notify RN    Comment 2 Document in Chart     Imaging / Studies: Dg Abd 1 View  Result Date: 09/21/2017 CLINICAL DATA:  Follow-up small bowel obstruction EXAM: ABDOMEN - 1 VIEW COMPARISON:  Yesterday FINDINGS: Nasogastric tube tip overlaps the distal stomach. Unchanged dilated small bowel. Stool seen throughout much of the colon. No visible  pneumatosis. No concerning gas collection. Streaky lower lobe opacities, stable from admission CT. IMPRESSION: 1. Unchanged small bowel obstruction. 2. Formed stool seen throughout the colon. 3. Nasogastric tube in good position. Electronically Signed   By: Monte Fantasia M.D.   On: 09/21/2017 09:01   Dg Abd Portable 2v  Result Date: 09/22/2017 CLINICAL DATA:  Followup small bowel obstruction. EXAM: PORTABLE ABDOMEN - 2 VIEW COMPARISON:  09/21/2017 and older exams. FINDINGS: There are persistent dilated loops of small bowel, similar to prior exams, consistent with a high-grade partial obstruction. Air in stool is seen within a nondilated colon. IMPRESSION: 1. Persistent high-grade small bowel obstruction. No significant change in small bowel distension when compared to the prior studies. Electronically Signed   By: Lajean Manes M.D.   On: 09/22/2017 07:33    Medications / Allergies: per chart  Antibiotics: Anti-infectives (From admission, onward)   Start     Dose/Rate Route Frequency Ordered Stop   09/22/17 1000  cefoTEtan (CEFOTAN) 2 g in sodium chloride 0.9 % 100 mL IVPB     2 g 200 mL/hr over 30 Minutes Intravenous On call to O.R. 09/22/17 0852 09/22/17 1600   09/21/17 1700  vancomycin (VANCOCIN) IVPB 750 mg/150 ml premix  Status:  Discontinued     750 mg 150 mL/hr over 60 Minutes Intravenous Every 12 hours 09/21/17 0956 09/22/17 0959   09/21/17 0600  vancomycin (VANCOCIN) IVPB 1000 mg/200 mL premix  Status:  Discontinued     1,000 mg 200 mL/hr over 60 Minutes Intravenous Every 24 hours 09/20/17 0628 09/20/17 0921   09/21/17 0600  vancomycin (VANCOCIN) IVPB 750 mg/150 ml premix  Status:  Discontinued     750 mg 150 mL/hr over 60 Minutes Intravenous Every 24 hours 09/20/17 0921 09/21/17 0956   09/20/17 0800  piperacillin-tazobactam (ZOSYN) IVPB 3.375 g     3.375 g 12.5 mL/hr over 240 Minutes Intravenous Every 8 hours 09/20/17 0612     09/20/17 0130  vancomycin (VANCOCIN) 1,500 mg in  sodium chloride 0.9 % 500 mL IVPB     1,500 mg 250 mL/hr over 120 Minutes Intravenous  Once 09/20/17 0100 09/20/17 0453   09/20/17 0100  piperacillin-tazobactam (ZOSYN) IVPB 3.375 g     3.375 g 100 mL/hr over 30 Minutes Intravenous  Once 09/20/17 0047 09/20/17 0214   09/20/17 0100  vancomycin (VANCOCIN) IVPB 1000 mg/200 mL premix  Status:  Discontinued     1,000 mg 200 mL/hr over 60 Minutes Intravenous  Once 09/20/17 0047 09/20/17 0100        Note:  Portions of this report may have been transcribed using voice recognition software. Every effort was made to ensure accuracy; however, inadvertent computerized transcription errors may be present.   Any transcriptional errors that result from this process are unintentional.     Adin Hector, M.D., F.A.C.S. Gastrointestinal and Minimally Invasive Surgery [facility] [address] [place] [address], [place] [address] [phone] Main / Paging   09/23/2017

## 2017-09-23 NOTE — Progress Notes (Signed)
Pt states he "stands sometimes" and "has a wheelchair" at home. Able to get pt to sit on the side of bed with 3 person assist. Pt will not stand at bedside or walk. He continues to state "my legs are slippery". Placed pt in chair position in bed.

## 2017-09-24 ENCOUNTER — Inpatient Hospital Stay (HOSPITAL_COMMUNITY): Payer: Medicare Other

## 2017-09-24 DIAGNOSIS — E876 Hypokalemia: Secondary | ICD-10-CM

## 2017-09-24 DIAGNOSIS — E8770 Fluid overload, unspecified: Secondary | ICD-10-CM | POA: Diagnosis not present

## 2017-09-24 LAB — MAGNESIUM: MAGNESIUM: 1.7 mg/dL (ref 1.7–2.4)

## 2017-09-24 LAB — GLUCOSE, CAPILLARY
Glucose-Capillary: 123 mg/dL — ABNORMAL HIGH (ref 65–99)
Glucose-Capillary: 78 mg/dL (ref 65–99)
Glucose-Capillary: 82 mg/dL (ref 65–99)
Glucose-Capillary: 95 mg/dL (ref 65–99)
Glucose-Capillary: 95 mg/dL (ref 65–99)

## 2017-09-24 LAB — CBC
HCT: 26.6 % — ABNORMAL LOW (ref 39.0–52.0)
HEMOGLOBIN: 8.8 g/dL — AB (ref 13.0–17.0)
MCH: 31 pg (ref 26.0–34.0)
MCHC: 33.1 g/dL (ref 30.0–36.0)
MCV: 93.7 fL (ref 78.0–100.0)
Platelets: 245 10*3/uL (ref 150–400)
RBC: 2.84 MIL/uL — ABNORMAL LOW (ref 4.22–5.81)
RDW: 15.1 % (ref 11.5–15.5)
WBC: 14.2 10*3/uL — ABNORMAL HIGH (ref 4.0–10.5)

## 2017-09-24 LAB — BASIC METABOLIC PANEL
Anion gap: 10 (ref 5–15)
BUN: 19 mg/dL (ref 6–20)
CALCIUM: 8.2 mg/dL — AB (ref 8.9–10.3)
CHLORIDE: 112 mmol/L — AB (ref 101–111)
CO2: 21 mmol/L — ABNORMAL LOW (ref 22–32)
CREATININE: 0.83 mg/dL (ref 0.61–1.24)
Glucose, Bld: 88 mg/dL (ref 65–99)
Potassium: 3.8 mmol/L (ref 3.5–5.1)
SODIUM: 143 mmol/L (ref 135–145)

## 2017-09-24 MED ORDER — MAGNESIUM SULFATE 4 GM/100ML IV SOLN
4.0000 g | Freq: Once | INTRAVENOUS | Status: AC
  Administered 2017-09-24: 4 g via INTRAVENOUS
  Filled 2017-09-24: qty 100

## 2017-09-24 MED ORDER — FUROSEMIDE 10 MG/ML IJ SOLN
40.0000 mg | Freq: Two times a day (BID) | INTRAMUSCULAR | Status: DC
  Administered 2017-09-24 (×2): 40 mg via INTRAVENOUS
  Filled 2017-09-24 (×2): qty 4

## 2017-09-24 MED ORDER — POTASSIUM CHLORIDE 10 MEQ/100ML IV SOLN
10.0000 meq | INTRAVENOUS | Status: AC
  Administered 2017-09-24 (×3): 10 meq via INTRAVENOUS
  Filled 2017-09-24 (×3): qty 100

## 2017-09-24 MED ORDER — CHLORHEXIDINE GLUCONATE CLOTH 2 % EX PADS
6.0000 | MEDICATED_PAD | Freq: Every day | CUTANEOUS | Status: AC
  Administered 2017-09-24: 6 via TOPICAL

## 2017-09-24 NOTE — Progress Notes (Signed)
OT Cancellation Note  Patient Details Name: Kevin Humphrey MRN: [id number] DOB: 03-May-1950   Cancelled Treatment:    Reason Eval/Treat Not Completed: Other (comment)   Medical issues which prohibited therapy(RN requested PT/OT hold this morning, pt just received IV lasix and is having frequent urination. Will follow. )   Carlyon Shadow, OT [phone] 09/24/2017, 11:27 AM

## 2017-09-24 NOTE — Progress Notes (Signed)
Pts O2 demand increased overnight. Currently on 6-8 L HFNC. Notified MD. Orders given for ABG and CXR. Will continue to monitor.

## 2017-09-24 NOTE — Progress Notes (Signed)
CSW attempted to call patient Legal Guardian again to discuss discharge, unable to reach. CSW left voicemail.   Kathrin Greathouse, Latanya Presser, MSW Clinical Social Worker  [phone] 09/24/2017  2:39 PM

## 2017-09-24 NOTE — Addendum Note (Signed)
Addendum  created 09/24/17 0646 by Lollie Sails, CRNA   Charge Capture section accepted

## 2017-09-24 NOTE — Progress Notes (Signed)
Sitter at bedside, restraints dc'd.  Co-operative currently

## 2017-09-24 NOTE — Progress Notes (Signed)
Pharmacy Antibiotic Note  Kevin Humphrey is a 67 y.o. male presented to the ED from [place] on 09/19/2017 with hematemesis and was found to have SBO.  Vancomycin and zosyn started on admission for suspected sepsis.  Today, 09/24/2017: - day #5 abx - tm 99.3, WBC 12 - SCr down 0.83 (crcl~90) - PCT down 1.06   Plan: - continue zosyn 3.375 gm IV q8h (infuse over 4 hrs) - no further dosing adjustments needed, pharmacy will sign off  ____________________________________  Height: 5\' 11"  (180.3 cm) Weight: 175 lb 4.3 oz (79.5 kg) IBW/kg (Calculated) : 75.3  Temp (24hrs), Avg:99 F (37.2 C), Min:98.6 F (37 C), Max:99.4 F (37.4 C)  Recent Labs  Lab 09/19/17 2341 09/20/17 0426 09/20/17 1561 09/20/17 1042 09/21/17 0521 09/22/17 0743 09/23/17 0343 09/24/17 0513  WBC  --   --  10.7*  --  12.0* 11.3* 12.5* 14.2*  CREATININE 1.60*  --  1.82*  --  1.11 0.80 0.85 0.83  LATICACIDVEN 2.40* 1.79 2.6* 1.3 1.0  --   --   --     Estimated Creatinine Clearance: 90.7 mL/min (by C-G formula based on SCr of 0.83 mg/dL).    No Known Allergies  Antimicrobials this admission: 5/30 zosyn >>  5/30 vancomycin >> 6/1  Dose adjustments this admission: none  Microbiology results: 5/30 BCx x1: ngtd 5/30 UCx: >100K corynebacterium species FINAL 5/30 MRSA PCR: pos  Thank you for allowing pharmacy to be a part of this patient's care.  Peggyann Juba, PharmD, BCPS Pager: [phone] 09/24/2017 7:27 AM

## 2017-09-24 NOTE — Progress Notes (Signed)
PT Cancellation Note  Patient Details Name: Kevin Humphrey MRN: [id number] DOB: February 10, 1951   Cancelled Treatment:    Reason Eval/Treat Not Completed: Medical issues which prohibited therapy(RN requested PT/OT hold this morning, pt just received IV lasix and is having frequent urination. Will follow. )   Philomena Doheny 09/24/2017, 10:57 AM [phone]

## 2017-09-24 NOTE — Progress Notes (Signed)
PROGRESS NOTE    Kevin Humphrey  TIW:[id number] DOB: [birth year] DOA: 09/19/2017 PCP: [name], MD    Brief Narrative:  Kevin Humphrey is a 67 y.o. male with history of advanced dementia, schizophrenia, hypertension was brought from the skilled nursing facility after patient was found to have persistent nausea vomiting with hematemesis.  Chest x-ray done at this nursing facility was showing some infiltrates and abdomen x-ray was showing concerning for bowel obstruction.  ED Course: In the ER stool for occult blood was positive.  Abdomen was distended and initially patient on arrival was confused and minimally responsive but somewhat improved after starting antibiotics and fluids.  CT abdomen pelvis shows bowel obstruction with transition point.  Chest x-ray was showing possibility of infiltrates.  Patient was hypotensive improved with fluids and empiric antibiotic started for sepsis likely from aspiration     Assessment & Plan:   Principal Problem:   SBO (small bowel obstruction) s/p lap adhesiolysis 09/22/2017 Active Problems:   Acute respiratory failure with hypoxia (HCC)   Volume overload   Schizophrenia (HCC)   Malnutrition of moderate degree (HCC)   ARF (acute renal failure) (HCC)   Hematemesis   Sepsis ([place])   Anxiety state   Small bowel obstruction (HCC)   Hypomagnesemia   Acute lower UTI   Acute metabolic encephalopathy   Ileus (HCC)  #1 high-grade small bowel obstruction that is post laparoscopic lysis of adhesions 09/22/2017./Ileus With small bowel obstruction secondary to adhesions.  Patient status post laparoscopic lysis of adhesions 09/22/2017 prior [name].  Patient now likely with an ileus with no bowel movements and no flatus and hypoactive bowel sounds as of 09/23/2017.  Patient stated small bowel movement yesterday however is receiving a suppository.  Patient denies any further abdominal pain.  Abdominal distention seem to have improved.  NG tube with 250  cc out over the past 24 hours.  Keep magnesium greater than 2.  Keep potassium greater than 4.  Saline lock IV fluids secondary to volume overload.  Supportive care.  Per general surgery.  2.  Acute respiratory failure with hypoxia secondary to volume overload Patient noted to have worsening shortness of breath overnight with increased O2 requirements requiring 8 L of high flow nasal cannula.  Chest x-ray obtained consistent with volume overload.  Urine output not accurately recorded.  Patient's current weight is 175 pounds from 160 pounds on admission.  Place on Lasix 40 mg IV every 12 hours.  Strict I's and O's.  Daily weights.  3 acute metabolic encephalopathy Likely drug-induced secondary to 2 mg of iv Dilaudid given 09/23/2017.  Patient now alert and following commands and likely close to baseline.  Saline lock IV fluids.  Follow.   4.  Acute renal failure Likely secondary to prerenal azotemia in the setting of ACE inhibitor.  Patient noted on admission to be hypotensive.  Patient hydrated aggressively with IV fluids.  Renal function improved and currently at baseline.  Patient now noted to be in volume overload.  Saline lock IV fluid.  Placed on IV Lasix and monitor renal function closely.  Continue to hold ACE inhibitor and avoid nephrotoxins.  Follow.  5.  Sepsis likely secondary to aspiration pneumonia and deep diphtheroids UTI Per CT abdomen and pelvis as well as chest x-ray concerning for probable pneumonia.  Patient on admission noted to be septic with hypotension, acute renal failure, tachycardia, elevated lactic acid level, elevated procalcitonin level.  Lactic acid level and pro calcitonin levels trending down.  Urine cultures with > 100000 colonies of diphtheroids.  Blood cultures pending with no growth to date.  IV vancomycin has been discontinued.  Continue IV Zosyn.  Saline lock IV fluids.  Follow.  6.  Anemia/hematemesis Likely dilutional in nature.  Patient on admission was noted to  have some hematemesis however no hematemesis noted during the hospitalization.  Patient with no overt bleeding.  Anemia panel has been checked and iron level at 11.  Ferritin at 142.  Vitamin B12 levels at 282.  Continue vitamin B12 IM injections.  Hemoglobin currently at 8.8 from 8.9 from 8.3 from 9.8 from 13.7.  Patient given a dose of IV Feraheme. Follow H&H, transfusion threshold hemoglobin less than 8.  Will likely need oral iron supplementation on discharge.  Continue PPI.  Follow.  7.  Hypertension On admission patient noted to be septic with hypotension.  Blood pressure improved and somewhat elevated this morning.  Antihypertensive medications on hold secondary to problem #1.  Continue IV Lopressor.  Patient to be given IV Lasix secondary to volume overload.  Follow.   8.  Schizophrenia Stable.  Continue IV Depakote.  IV Haldol as needed.  9.  Hypomagnesemia/hypokalemia Secondary to GI losses via NG tube.  Magnesium currently at 1.7.  Keep magnesium greater than 2.  Follow.      DVT prophylaxis: SCDs Code Status: DNR Family Communication: No family at bedside. Disposition Plan: Remain in stepdown unit.  Likely back to nursing facility once medically stable and resolution of small bowel obstruction and hypoxia with palliative care following in the outpatient setting..   Consultants:   General surgery: Dr. Marcello Moores 09/20/2017  Palliative care: Jobe Gibbon, NP/Dr. Rowe Pavy 09/21/2017  Procedures:   CT abdomen and pelvis 09/20/2017  Chest x-ray 09/20/2017, 09/24/2017  Abdominal films 09/20/2017, 09/22/2017  Laparoscopic lysis of adhesions per [name] 09/22/2017  Antimicrobials:  IV Zosyn 09/20/2017  IV vancomycin 09/20/2017>>>>>> 09/22/2017   Subjective: NG tube in place.  Patient alert and following commands.  Denies any chest pain.  Denies any abdominal pain.  States had a small bowel movement last night.  Patient noted to be hypoxic overnight with shortness of breath  increased O2 requirements requiring high flow nasal cannula at 8 L.  Currently on high flow nasal cannula at 4 L.  Objective: Vitals:   09/24/17 0400 09/24/17 0442 09/24/17 0451 09/24/17 0800  BP: (!) 163/92     Pulse: 93 88    Resp: 19 17    Temp: 99.3 F (37.4 C)   99.9 F (37.7 C)  TempSrc: Axillary   Axillary  SpO2: (!) 89% (!) 88%    Weight:   79.5 kg (175 lb 4.3 oz)   Height:        Intake/Output Summary (Last 24 hours) at 09/24/2017 0925 Last data filed at 09/24/2017 0915 Gross per 24 hour  Intake 1904.5 ml  Output 1356 ml  Net 548.5 ml   Filed Weights   09/21/17 0500 09/23/17 0200 09/24/17 0451  Weight: 75.1 kg (165 lb 9.1 oz) 76.3 kg (168 lb 3.4 oz) 79.5 kg (175 lb 4.3 oz)    Examination:  General exam: NGT in place.  Alert.  Following commands.   Respiratory system: Diffuse scattered crackles.  No wheezing.  No rhonchi.   Respiratory effort normal. Cardiovascular system: Regular rate and rhythm no murmurs rubs or gallops.  No lower extremity edema. Gastrointestinal system: Abdomen is softer, nondistended, decreased bowel sounds, nontender to palpation, no rebound, no guarding.  Central nervous system: Alert and oriented.  Moving extremities spontaneously.   Extremities: Symmetric 5 x 5 power. Skin: No rashes, lesions or ulcers Psychiatry: Judgement and insight appear normal. Mood & affect appropriate.     Data Reviewed: I have personally reviewed following labs and imaging studies  CBC: Recent Labs  Lab 09/19/17 2324  09/20/17 1950 09/21/17 0521 09/22/17 0743 09/23/17 0343 09/24/17 0513  WBC 10.0  --  10.7* 12.0* 11.3* 12.5* 14.2*  NEUTROABS 8.2*  --  8.6* 9.6* 9.2* 11.1*  --   HGB 15.6   < > 13.7 9.8* 8.3* 8.9* 8.8*  HCT 44.8   < > 39.3 29.3* 25.1* 25.2* 26.6*  MCV 92.0  --  92.0 92.7 94.0 92.3 93.7  PLT 420*  --  350 241 207 260 245   < > = values in this interval not displayed.   Basic Metabolic Panel: Recent Labs  Lab 09/20/17 0642  09/21/17 0521 09/22/17 0743 09/23/17 0343 09/24/17 0513  NA 136 140 141 143 143  K 4.5 3.9 3.0* 4.3 3.8  CL 96* 108 114* 114* 112*  CO2 27 27 20* 21* 21*  GLUCOSE 138* 109* 127* 102* 88  BUN 33* 26* 18 20 19   CREATININE 1.82* 1.11 0.80 0.85 0.83  CALCIUM 8.4* 7.8* 6.7* 8.0* 8.2*  MG 1.5* 2.5*  --  1.9 1.7   GFR: Estimated Creatinine Clearance: 90.7 mL/min (by C-G formula based on SCr of 0.83 mg/dL). Liver Function Tests: Recent Labs  Lab 09/19/17 2324 09/20/17 0642  AST 24 27  ALT 19 21  ALKPHOS 66 51  BILITOT 0.6 0.7  PROT 7.9 6.6  ALBUMIN 4.1 3.4*   Recent Labs  Lab 09/19/17 2324  LIPASE 19   Recent Labs  Lab 09/20/17 0642  AMMONIA 24   Coagulation Profile: Recent Labs  Lab 09/20/17 0642  INR 1.14  1.06   Cardiac Enzymes: Recent Labs  Lab 09/19/17 2324 09/20/17 0642 09/20/17 1245 09/20/17 1843  TROPONINI <0.03 0.03* 0.03* <0.03   BNP (last 3 results) No results for input(s): PROBNP in the last 8760 hours. HbA1C: No results for input(s): HGBA1C in the last 72 hours. CBG: Recent Labs  Lab 09/23/17 0724 09/23/17 1129 09/23/17 1711 09/24/17 0007 09/24/17 0600  GLUCAP 90 91 86 78 82   Lipid Profile: No results for input(s): CHOL, HDL, LDLCALC, TRIG, CHOLHDL, LDLDIRECT in the last 72 hours. Thyroid Function Tests: No results for input(s): TSH, T4TOTAL, FREET4, T3FREE, THYROIDAB in the last 72 hours. Anemia Panel: No results for input(s): VITAMINB12, FOLATE, FERRITIN, TIBC, IRON, RETICCTPCT in the last 72 hours. Sepsis Labs: Recent Labs  Lab 09/20/17 0426 09/20/17 9326 09/20/17 1042 09/21/17 0521 09/22/17 0743  PROCALCITON  --  4.83  --  3.06 1.06  LATICACIDVEN 1.79 2.6* 1.3 1.0  --     Recent Results (from the past 240 hour(s))  Blood Culture (routine x 2)     Status: None (Preliminary result)   Collection Time: 09/19/17 11:15 PM  Result Value Ref Range Status   Specimen Description   Final    BLOOD LEFT ARM UPPER Performed at  [facility] [address], [place], [place] [address]    Special Requests   Final    BOTTLES DRAWN AEROBIC AND ANAEROBIC Blood Culture adequate volume Performed at [facility] [address], [place], [place] [address]    Culture   Final    NO GROWTH 3 DAYS Performed at [facility], [address], [place], [place] [address]    Report Status PENDING  Incomplete  Urine culture     Status: Abnormal   Collection Time: 09/20/17  1:28 AM  Result Value Ref Range Status   Specimen Description   Final    URINE, CATHETERIZED Performed at [facility] [address], [place], [place] [address]    Special Requests   Final    NONE Performed at [facility], [place] [address], [place], [place] [address]    Culture (A)  Final    >=100,000 COLONIES/mL DIPHTHEROIDS(CORYNEBACTERIUM SPECIES)   Report Status 09/21/2017 FINAL  Final  MRSA PCR Screening     Status: Abnormal   Collection Time: 09/20/17 12:37 PM  Result Value Ref Range Status   MRSA by PCR POSITIVE (A) NEGATIVE Final    Comment:        The GeneXpert MRSA Assay (FDA approved for NASAL specimens only), is one component of a comprehensive MRSA colonization surveillance program. It is not intended to diagnose MRSA infection nor to guide or monitor treatment for MRSA infections. RESULT CALLED TO, READ BACK BY AND VERIFIED WITH: Alcide Clever [id number] @ 1601 BY J SCOTTON Performed at [facility] [address], [place],  [address]          Radiology Studies: Dg Chest Port 1 View  Result Date: 09/24/2017 CLINICAL DATA:  Shortness of breath. EXAM: PORTABLE CHEST 1 VIEW COMPARISON:  Chest radiograph Sep 19, 2017 FINDINGS: Increasing interstitial and to lesser extent alveolar airspace opacities. Cardiac silhouette is upper limits of normal. Patient rotated LEFT. Mediastinal silhouette is nonsuspicious. Old bilateral  rib fractures. Nasogastric tube tip projecting in distal stomach. IMPRESSION: Increasing interstitial and alveolar airspace opacities seen with pulmonary edema or pneumonia. Nasogastric tube tip projecting in distal stomach. Electronically Signed   By: Elon Alas M.D.   On: 09/24/2017 05:18        Scheduled Meds: . bisacodyl  10 mg Rectal Daily  . Chlorhexidine Gluconate Cloth  6 each Topical Q0600  . cyanocobalamin  1,000 mcg Intramuscular Daily  . furosemide  40 mg Intravenous Q12H  . mouth rinse  15 mL Mouth Rinse BID  . metoprolol tartrate  5 mg Intravenous Q8H  . mupirocin ointment  1 application Nasal BID   Continuous Infusions: . chlorproMAZINE (THORAZINE) IV    . famotidine (PEPCID) IV Stopped (09/24/17 0040)  . lactated ringers Stopped (09/22/17 1343)  . lactated ringers    . magnesium sulfate 1 - 4 g bolus IVPB 4 g (09/24/17 0800)  . methocarbamol (ROBAXIN)  IV 1,000 mg (09/24/17 0447)  . piperacillin-tazobactam (ZOSYN)  IV 3.375 g (09/24/17 0915)  . potassium chloride    . valproate sodium Stopped (09/24/17 0721)     LOS: 4 days    Time spent: 40 minutes    Irine Seal, MD [facility] Pager [phone]  If 7PM-7AM, please contact night-coverage [email or web address] Password [email or web address] 09/24/2017, 9:25 AM

## 2017-09-24 NOTE — Progress Notes (Signed)
SLP Cancellation Note  Patient Details Name: Cristan Scherzer MRN: [id number] DOB: Jan 15, 1951   Cancelled treatment:       Reason Eval/Treat Not Completed: Other (comment);Medical issues which prohibited therapy(pt with clamped NG tube but has not moved bowels per RN, remains NPO, will contnue efforts)   Macario Golds 09/24/2017, 11:21 AM  Luanna Salk, [facility] SLP [phone]

## 2017-09-24 NOTE — Progress Notes (Signed)
[facility] Progress Note  2 Days Post-Op  Subjective: CC:  Unable to report his own medical history 2/2 dementia/schizophrenia. Pt in no acute distress.  2 stools documented past 24h, ~300cc/24h NG tube, bilious   Objective: Vital signs in last 24 hours: Temp:  [98.6 F (37 C)-99.4 F (37.4 C)] 99.3 F (37.4 C) (06/03 0400) Pulse Rate:  [68-101] 88 (06/03 0442) Resp:  [8-26] 17 (06/03 0442) BP: (143-179)/(58-92) 163/92 (06/03 0400) SpO2:  [88 %-95 %] 88 % (06/03 0442) Weight:  [79.5 kg (175 lb 4.3 oz)] 79.5 kg (175 lb 4.3 oz) (06/03 0451) Last BM Date: 09/23/17  Intake/Output from previous day: 06/02 0701 - 06/03 0700 In: 1817 [I.V.:1497.5; IV Piggyback:319.5] Out: 1031 [Urine:780; Emesis/NG output:250; Stool:1] Intake/Output this shift: No intake/output data recorded.  PE: Gen:  Alert, NAD, Pulm:  Normal effort Abd: Soft, non-tender, non-distended, bowel sounds present in all 4 quadrants, incisions C/D/I GU: condom cath Skin: warm and dry, no rashes  Psych: A&Ox3   Lab Results:  Recent Labs    09/23/17 0343 09/24/17 0513  WBC 12.5* 14.2*  HGB 8.9* 8.8*  HCT 25.2* 26.6*  PLT 260 245   BMET Recent Labs    09/23/17 0343 09/24/17 0513  NA 143 143  K 4.3 3.8  CL 114* 112*  CO2 21* 21*  GLUCOSE 102* 88  BUN 20 19  CREATININE 0.85 0.83  CALCIUM 8.0* 8.2*   PT/INR No results for input(s): LABPROT, INR in the last 72 hours. CMP     Component Value Date/Time   NA 143 09/24/2017 0513   NA 137 08/27/2015   K 3.8 09/24/2017 0513   CL 112 (H) 09/24/2017 0513   CO2 21 (L) 09/24/2017 0513   GLUCOSE 88 09/24/2017 0513   BUN 19 09/24/2017 0513   BUN 16 08/27/2015   CREATININE 0.83 09/24/2017 0513   CALCIUM 8.2 (L) 09/24/2017 0513   PROT 6.6 09/20/2017 0642   ALBUMIN 3.4 (L) 09/20/2017 0642   AST 27 09/20/2017 0642   ALT 21 09/20/2017 0642   ALKPHOS 51 09/20/2017 0642   BILITOT 0.7 09/20/2017 0642   GFRNONAA >60 09/24/2017 0513   GFRAA  >60 09/24/2017 0513   Lipase     Component Value Date/Time   LIPASE 19 09/19/2017 2324       Studies/Results: Dg Chest Port 1 View  Result Date: 09/24/2017 CLINICAL DATA:  Shortness of breath. EXAM: PORTABLE CHEST 1 VIEW COMPARISON:  Chest radiograph Sep 19, 2017 FINDINGS: Increasing interstitial and to lesser extent alveolar airspace opacities. Cardiac silhouette is upper limits of normal. Patient rotated LEFT. Mediastinal silhouette is nonsuspicious. Old bilateral rib fractures. Nasogastric tube tip projecting in distal stomach. IMPRESSION: Increasing interstitial and alveolar airspace opacities seen with pulmonary edema or pneumonia. Nasogastric tube tip projecting in distal stomach. Electronically Signed   By: Elon Alas M.D.   On: 09/24/2017 05:18    Anti-infectives: Anti-infectives (From admission, onward)   Start     Dose/Rate Route Frequency Ordered Stop   09/22/17 1000  cefoTEtan (CEFOTAN) 2 g in sodium chloride 0.9 % 100 mL IVPB     2 g 200 mL/hr over 30 Minutes Intravenous On call to O.R. 09/22/17 0852 09/22/17 1600   09/21/17 1700  vancomycin (VANCOCIN) IVPB 750 mg/150 ml premix  Status:  Discontinued     750 mg 150 mL/hr over 60 Minutes Intravenous Every 12 hours 09/21/17 0956 09/22/17 0959   09/21/17 0600  vancomycin (VANCOCIN) IVPB 1000 mg/200 mL  premix  Status:  Discontinued     1,000 mg 200 mL/hr over 60 Minutes Intravenous Every 24 hours 09/20/17 0628 09/20/17 0921   09/21/17 0600  vancomycin (VANCOCIN) IVPB 750 mg/150 ml premix  Status:  Discontinued     750 mg 150 mL/hr over 60 Minutes Intravenous Every 24 hours 09/20/17 0921 09/21/17 0956   09/20/17 0800  piperacillin-tazobactam (ZOSYN) IVPB 3.375 g     3.375 g 12.5 mL/hr over 240 Minutes Intravenous Every 8 hours 09/20/17 0612     09/20/17 0130  vancomycin (VANCOCIN) 1,500 mg in sodium chloride 0.9 % 500 mL IVPB     1,500 mg 250 mL/hr over 120 Minutes Intravenous  Once 09/20/17 0100 09/20/17 0453    09/20/17 0100  piperacillin-tazobactam (ZOSYN) IVPB 3.375 g     3.375 g 100 mL/hr over 30 Minutes Intravenous  Once 09/20/17 0047 09/20/17 0214   09/20/17 0100  vancomycin (VANCOCIN) IVPB 1000 mg/200 mL premix  Status:  Discontinued     1,000 mg 200 mL/hr over 60 Minutes Intravenous  Once 09/20/17 0047 09/20/17 0100       Assessment/Plan ARF Aspiration PNA UTI Anemia, normocytic - no overt bleeding. follow H&H, hgb 8.8 from 8.9; on iron per primary team  HTN Schizophrenia  Dementia  Malnutrition   SBO 2/2 adehesions from previous laparotomy S/P LAPAROSCOPICLYSIS OF ADHESIONS 09/22/17 [name]  -  POD#2, afebrile  -  According to chart review pt is having bowel movements. Pt and nursing staff did not report any BMs this morning. - NG tube with ~300 cc /24h >> NG tube clamping trial today, D/C later today vs tomorrow if tolerates. -  Mobilize, agree with PT eval today -  IS   FEN: NPO, IVF, NGT  ID: Zosyn (UTI/PNA) 5/30>>, WBC 14.3  VTE: SCD's    LOS: 4 days    Jill Alexanders , [facility] 09/24/2017, 7:59 AM Pager: [phone] Consults: [phone] Mon-Fri 7:00 am-4:30 pm Sat-Sun 7:00 am-11:30 am

## 2017-09-24 NOTE — Evaluation (Signed)
Physical Therapy Evaluation Patient Details Name: Kevin Humphrey MRN: [id number] DOB: 1951/01/13 Today's Date: 09/24/2017   History of Present Illness  67 y.o.malewithhistory of advanced dementia, schizophrenia, hypertension was brought from the skilled nursing facility after patient was found to have persistent nausea vomiting with hematemesis. Chest x-ray done at this nursing facility was showing some infiltrates and abdomen x-ray was showing concerning for bowel obstruction.  Clinical Impression  Pt admitted with above diagnosis. Pt currently with functional limitations due to the deficits listed below (see PT Problem List). +2 max assist for supine to sit and to transfer bed to recliner. Pt's baseline level of function is unknown as he is unable to provide this 2* decreased cognition.  Pt will benefit from skilled PT to increase their independence and safety with mobility to allow discharge to the venue listed below.       Follow Up Recommendations SNF;Supervision/Assistance - 24 hour    Equipment Recommendations  Wheelchair (measurements PT);Wheelchair cushion (measurements PT)    Recommendations for Other Services       Precautions / Restrictions Precautions Precautions: Fall Restrictions Weight Bearing Restrictions: No      Mobility  Bed Mobility Overal bed mobility: Needs Assistance Bed Mobility: Supine to Sit     Supine to sit: +2 for safety/equipment;Total assist        Transfers Overall transfer level: Needs assistance   Transfers: Sit to/from Stand;Stand Pivot Transfers Sit to Stand: +2 physical assistance;+2 safety/equipment;Max assist Stand pivot transfers: +2 physical assistance;+2 safety/equipment;Max assist       General transfer comment: posterior lean in standing requiring max assist, manual/verbal cues to pivot to recliner  Ambulation/Gait                Stairs            Wheelchair Mobility    Modified Rankin (Stroke Patients  Only)       Balance Overall balance assessment: Needs assistance Sitting-balance support: Feet supported Sitting balance-Leahy Scale: Fair       Standing balance-Leahy Scale: Zero                               Pertinent Vitals/Pain Pain Assessment: No/denies pain    Home Living Family/patient expects to be discharged to:: Skilled nursing facility                      Prior Function Level of Independence: Needs assistance         Comments: pt unable to provide baseline info, likely needed assistance     Hand Dominance        Extremity/Trunk Assessment   Upper Extremity Assessment Upper Extremity Assessment: Generalized weakness    Lower Extremity Assessment Lower Extremity Assessment: Generalized weakness    Cervical / Trunk Assessment Cervical / Trunk Assessment: Kyphotic  Communication   Communication: Expressive difficulties(slurred speech, 1-2 word answers to questions, parrots back words he hears)  Cognition Arousal/Alertness: Awake/alert Behavior During Therapy: Flat affect Overall Cognitive Status: No family/caregiver present to determine baseline cognitive functioning                                 General Comments: per prior PT note, pt is a ward of the state; pt stated, "I don't know" when asked his birthdate      General Comments  Exercises     Assessment/Plan    PT Assessment Patient needs continued PT services  PT Problem List Decreased activity tolerance;Decreased balance;Decreased mobility;Decreased cognition       PT Treatment Interventions Gait training;Therapeutic activities;Functional mobility training;Patient/family education;Therapeutic exercise    PT Goals (Current goals can be found in the Care Plan section)  Acute Rehab PT Goals PT Goal Formulation: Patient unable to participate in goal setting Time For Goal Achievement: 10/08/17 Potential to Achieve Goals: Fair    Frequency  Min 2X/week   Barriers to discharge        Co-evaluation               AM-PAC PT "6 Clicks" Daily Activity  Outcome Measure Difficulty turning over in bed (including adjusting bedclothes, sheets and blankets)?: Unable Difficulty moving from lying on back to sitting on the side of the bed? : Unable Difficulty sitting down on and standing up from a chair with arms (e.g., wheelchair, bedside commode, etc,.)?: Unable Help needed moving to and from a bed to chair (including a wheelchair)?: Total Help needed walking in hospital room?: Total Help needed climbing 3-5 steps with a railing? : Total 6 Click Score: 6    End of Session Equipment Utilized During Treatment: Gait belt Activity Tolerance: Patient tolerated treatment well Patient left: in chair;with call bell/phone within reach;with nursing/sitter in room Nurse Communication: Mobility status PT Visit Diagnosis: Unsteadiness on feet (R26.81);Difficulty in walking, not elsewhere classified (R26.2)    Time: 1610-9604 PT Time Calculation (min) (ACUTE ONLY): 13 min   Charges:   PT Evaluation $PT Eval Moderate Complexity: 1 Mod     PT G Codes:          Philomena Doheny 09/24/2017, 2:18 PM [phone]

## 2017-09-25 ENCOUNTER — Inpatient Hospital Stay (HOSPITAL_COMMUNITY): Payer: Medicare Other

## 2017-09-25 LAB — CBC
HCT: 29.5 % — ABNORMAL LOW (ref 39.0–52.0)
HEMOGLOBIN: 9.8 g/dL — AB (ref 13.0–17.0)
MCH: 30.7 pg (ref 26.0–34.0)
MCHC: 33.2 g/dL (ref 30.0–36.0)
MCV: 92.5 fL (ref 78.0–100.0)
Platelets: 402 10*3/uL — ABNORMAL HIGH (ref 150–400)
RBC: 3.19 MIL/uL — AB (ref 4.22–5.81)
RDW: 15 % (ref 11.5–15.5)
WBC: 18.1 10*3/uL — ABNORMAL HIGH (ref 4.0–10.5)

## 2017-09-25 LAB — BASIC METABOLIC PANEL
Anion gap: 13 (ref 5–15)
BUN: 19 mg/dL (ref 6–20)
CHLORIDE: 105 mmol/L (ref 101–111)
CO2: 25 mmol/L (ref 22–32)
Calcium: 8.4 mg/dL — ABNORMAL LOW (ref 8.9–10.3)
Creatinine, Ser: 0.93 mg/dL (ref 0.61–1.24)
GFR calc Af Amer: >60 mL/min (ref >60)
GFR calc non Af Amer: >60 mL/min (ref >60)
Glucose, Bld: 97 mg/dL (ref 65–99)
POTASSIUM: 3.7 mmol/L (ref 3.5–5.1)
SODIUM: 143 mmol/L (ref 135–145)

## 2017-09-25 LAB — GLUCOSE, CAPILLARY
GLUCOSE-CAPILLARY: 101 mg/dL — AB (ref 65–99)
GLUCOSE-CAPILLARY: 104 mg/dL — AB (ref 65–99)
GLUCOSE-CAPILLARY: 106 mg/dL — AB (ref 65–99)
Glucose-Capillary: 110 mg/dL — ABNORMAL HIGH (ref 65–99)

## 2017-09-25 LAB — MAGNESIUM: Magnesium: 2.1 mg/dL (ref 1.7–2.4)

## 2017-09-25 LAB — CULTURE, BLOOD (ROUTINE X 2)
Culture: NO GROWTH
Special Requests: ADEQUATE

## 2017-09-25 MED ORDER — POLYETHYLENE GLYCOL 3350 17 G PO PACK
17.0000 g | PACK | Freq: Two times a day (BID) | ORAL | Status: DC
  Administered 2017-09-25 – 2017-10-10 (×21): 17 g via ORAL
  Filled 2017-09-25 (×22): qty 1

## 2017-09-25 MED ORDER — FUROSEMIDE 10 MG/ML IJ SOLN
60.0000 mg | Freq: Three times a day (TID) | INTRAMUSCULAR | Status: DC
  Administered 2017-09-25 – 2017-09-26 (×5): 60 mg via INTRAVENOUS
  Filled 2017-09-25 (×5): qty 6

## 2017-09-25 MED ORDER — MORPHINE SULFATE (PF) 2 MG/ML IV SOLN
0.5000 mg | INTRAVENOUS | Status: DC | PRN
  Administered 2017-09-25: 0.5 mg via INTRAVENOUS
  Administered 2017-10-05: 1 mg via INTRAVENOUS
  Administered 2017-10-06: 0.5 mg via INTRAVENOUS
  Administered 2017-10-08: 1 mg via INTRAVENOUS
  Filled 2017-09-25 (×4): qty 1

## 2017-09-25 MED ORDER — POTASSIUM CHLORIDE 10 MEQ/100ML IV SOLN
10.0000 meq | INTRAVENOUS | Status: AC
  Administered 2017-09-25 (×4): 10 meq via INTRAVENOUS
  Filled 2017-09-25 (×4): qty 100

## 2017-09-25 MED ORDER — MAGNESIUM SULFATE 4 GM/100ML IV SOLN
4.0000 g | Freq: Once | INTRAVENOUS | Status: AC
  Administered 2017-09-25: 4 g via INTRAVENOUS
  Filled 2017-09-25: qty 100

## 2017-09-25 NOTE — Progress Notes (Signed)
PROGRESS NOTE    Kevin Humphrey  JOA:[id number] DOB: 1950-07-12 DOA: 09/19/2017 PCP: [name], MD    Brief Narrative:  Kevin Humphrey is a 67 y.o. male with history of advanced dementia, schizophrenia, hypertension was brought from the skilled nursing facility after patient was found to have persistent nausea vomiting with hematemesis.  Chest x-ray done at this nursing facility was showing some infiltrates and abdomen x-ray was showing concerning for bowel obstruction.  ED Course: In the ER stool for occult blood was positive.  Abdomen was distended and initially patient on arrival was confused and minimally responsive but somewhat improved after starting antibiotics and fluids.  CT abdomen pelvis shows bowel obstruction with transition point.  Chest x-ray was showing possibility of infiltrates.  Patient was hypotensive improved with fluids and empiric antibiotic started for sepsis likely from aspiration     Assessment & Plan:   Principal Problem:   SBO (small bowel obstruction) s/p lap adhesiolysis 09/22/2017 Active Problems:   Acute respiratory failure with hypoxia (HCC)   Volume overload   Schizophrenia (HCC)   Malnutrition of moderate degree (HCC)   ARF (acute renal failure) (HCC)   Hematemesis   Sepsis (HCC)   Anxiety state   Small bowel obstruction (HCC)   Hypomagnesemia   Acute lower UTI   Acute metabolic encephalopathy   Ileus (HCC)   Hypokalemia  #1 high-grade small bowel obstruction that is post laparoscopic lysis of adhesions 09/22/2017./Ileus With small bowel obstruction secondary to adhesions.  Patient status post laparoscopic lysis of adhesions 09/22/2017 prior [name].  Patient now likely with an ileus with no bowel movements and no flatus and hypoactive bowel sounds as of 09/23/2017.  Patient noted to have some harder balls of stool yesterday.  No bowel movement today yet.  Patient denies any abdominal pain.  No nausea or vomiting.  NG tube has been  removed per general surgery and patient is to be started on some clears today.   Keep magnesium greater than 2.  Keep potassium greater than 4.  Saline lock IV fluids secondary to volume overload.  Supportive care.  Per general surgery.  2.  Acute respiratory failure with hypoxia secondary to volume overload Patient noted to have worsening shortness of breath the night of 09/23/2017 to the morning of 09/24/2017, with increased O2 requirements requiring 8 L of high flow nasal cannula.  Chest x-ray obtained consistent with volume overload.  Urine output not accurately recorded.  Patient's current weight is 165 pounds from 167 pounds from 175 pounds from 160 pounds on admission.  Patient with a urine output of 1.97 5 L over the past 24 hours.  Increase Lasix to 60 mg IV every 8 hours for the next 24 hours and pending volume status could likely decrease diuretic dose tomorrow.  Continue strict I's and O's.  Daily weights.    3 acute metabolic encephalopathy Likely drug-induced secondary to 2 mg of iv Dilaudid given 09/23/2017.  Patient now alert and following commands and likely close to baseline.  Resolved.   4.  Acute renal failure Likely secondary to prerenal azotemia in the setting of ACE inhibitor.  Patient noted on admission to be hypotensive.  Patient hydrated aggressively with IV fluids.  Renal function improved and currently at baseline.  Patient now noted to be in volume overload.  Patient on IV Lasix.  Monitor renal function closely with diuresis.  Continue to hold ACE inhibitor and avoid nephrotoxins.  Follow.  5.  Sepsis likely secondary  to aspiration pneumonia and deep diphtheroids UTI Per CT abdomen and pelvis as well as chest x-ray concerning for probable pneumonia.  Patient on admission noted to be septic with hypotension, acute renal failure, tachycardia, elevated lactic acid level, elevated procalcitonin level.  Lactic acid level and pro calcitonin levels trending down.  Urine cultures with >  100000 colonies of diphtheroids.  Blood cultures pending with no growth to date.  IV vancomycin has been discontinued.  Continue IV Zosyn D5/7.  Follow.  6.  Anemia/hematemesis Likely dilutional in nature.  Patient on admission was noted to have some hematemesis however no hematemesis noted during the hospitalization.  Patient with no overt bleeding.  Anemia panel has been checked and iron level at 11.  Ferritin at 142.  Vitamin B12 levels at 282.  Continue vitamin B12 IM injections.  Hemoglobin currently at 0.8 from 8.8 from 8.9 from 8.3 from 9.8 from 13.7.  Patient s/p IV Feraheme. Follow H&H, transfusion threshold hemoglobin less than 8.  Will likely need oral iron supplementation on discharge.  Continue PPI.  Follow.  7.  Hypertension On admission patient noted to be septic with hypotension.  Blood pressure improved.  BP elevated.  Antihypertensive medications on hold secondary to problem #1.  Continue IV Lopressor.  Continue IV Lasix for volume overload and follow.   8.  Schizophrenia Stable.  Continue IV Depakote.  IV Haldol as needed.  9.  Hypomagnesemia/hypokalemia Secondary to GI losses via NG tube.  Magnesium at 2.1.  Keep potassium greater than 4.  Keep magnesium greater than 2.  Follow.     DVT prophylaxis: SCDs Code Status: DNR Family Communication: No family at bedside. Disposition Plan: Remain in stepdown unit.  Likely transfer to the regular floor tomorrow.  Likely back to nursing facility once medically stable and resolution of small bowel obstruction and hypoxia with palliative care following in the outpatient setting..   Consultants:   General surgery: Dr. Marcello Moores 09/20/2017  Palliative care: Jobe Gibbon, NP/Dr. Rowe Pavy 09/21/2017  Procedures:   CT abdomen and pelvis 09/20/2017  Chest x-ray 09/20/2017, 09/24/2017  Abdominal films 09/20/2017, 09/22/2017  Laparoscopic lysis of adhesions per [name] 09/22/2017  Antimicrobials:  IV Zosyn 09/20/2017  IV  vancomycin 09/20/2017>>>>>> 09/22/2017   Subjective: She is sleeping.  NG tube has been removed.  Patient denies any chest pain.  Denies any shortness of breath.  Denies any abdominal pain.  Patient denies any bowel movement today.  Oxygen requirements improving.  It is noted that patient had some hard stools yesterday and it is noted that RN had to assist with some manual disimpaction.   Objective: Vitals:   09/25/17 0229 09/25/17 0406 09/25/17 0709 09/25/17 0800  BP:  (!) 186/84  (!) 180/82  Pulse:  98  91  Resp:  16  (!) 24  Temp:  (!) 97.1 F (36.2 C) 99.9 F (37.7 C)   TempSrc:  Axillary Axillary   SpO2:  93%  95%  Weight: 75.2 kg (165 lb 12.6 oz)     Height:        Intake/Output Summary (Last 24 hours) at 09/25/2017 1000 Last data filed at 09/25/2017 0915 Gross per 24 hour  Intake 472.5 ml  Output 2110 ml  Net -1637.5 ml   Filed Weights   09/24/17 0451 09/24/17 1700 09/25/17 0229  Weight: 79.5 kg (175 lb 4.3 oz) 76.1 kg (167 lb 12.3 oz) 75.2 kg (165 lb 12.6 oz)    Examination:  General exam: NG tube removed.  Respiratory system: Bibasilar crackles.  No wheezing.  No rhonchi.  Respiratory effort normal. Cardiovascular system: RRR no murmurs rubs or gallops.  No lower extremity edema.   Gastrointestinal system: Abdomen is softer, nondistended, nontender, positive bowel sounds.  No rebound.  No guarding.    Central nervous system: Alert and oriented.  Moving extremities spontaneously.   Extremities: Symmetric 5 x 5 power. Skin: No rashes, lesions or ulcers Psychiatry: Judgement and insight appear normal. Mood & affect appropriate.     Data Reviewed: I have personally reviewed following labs and imaging studies  CBC: Recent Labs  Lab 09/19/17 2324  09/20/17 8144 09/21/17 0521 09/22/17 0743 09/23/17 0343 09/24/17 0513 09/25/17 0758  WBC 10.0  --  10.7* 12.0* 11.3* 12.5* 14.2* 18.1*  NEUTROABS 8.2*  --  8.6* 9.6* 9.2* 11.1*  --   --   HGB 15.6   < > 13.7 9.8*  8.3* 8.9* 8.8* 9.8*  HCT 44.8   < > 39.3 29.3* 25.1* 25.2* 26.6* 29.5*  MCV 92.0  --  92.0 92.7 94.0 92.3 93.7 92.5  PLT 420*  --  350 241 207 260 245 402*   < > = values in this interval not displayed.   Basic Metabolic Panel: Recent Labs  Lab 09/20/17 0642 09/21/17 0521 09/22/17 0743 09/23/17 0343 09/24/17 0513 09/25/17 0758  NA 136 140 141 143 143 143  K 4.5 3.9 3.0* 4.3 3.8 3.7  CL 96* 108 114* 114* 112* 105  CO2 27 27 20* 21* 21* 25  GLUCOSE 138* 109* 127* 102* 88 97  BUN 33* 26* 18 20 19 19   CREATININE 1.82* 1.11 0.80 0.85 0.83 0.93  CALCIUM 8.4* 7.8* 6.7* 8.0* 8.2* 8.4*  MG 1.5* 2.5*  --  1.9 1.7 2.1   GFR: Estimated Creatinine Clearance: 80.9 mL/min (by C-G formula based on SCr of 0.93 mg/dL). Liver Function Tests: Recent Labs  Lab 09/19/17 2324 09/20/17 0642  AST 24 27  ALT 19 21  ALKPHOS 66 51  BILITOT 0.6 0.7  PROT 7.9 6.6  ALBUMIN 4.1 3.4*   Recent Labs  Lab 09/19/17 2324  LIPASE 19   Recent Labs  Lab 09/20/17 0642  AMMONIA 24   Coagulation Profile: Recent Labs  Lab 09/20/17 0642  INR 1.14  1.06   Cardiac Enzymes: Recent Labs  Lab 09/19/17 2324 09/20/17 0642 09/20/17 1245 09/20/17 1843  TROPONINI <0.03 0.03* 0.03* <0.03   BNP (last 3 results) No results for input(s): PROBNP in the last 8760 hours. HbA1C: No results for input(s): HGBA1C in the last 72 hours. CBG: Recent Labs  Lab 09/24/17 0600 09/24/17 1221 09/24/17 1801 09/24/17 2309 09/25/17 0616  GLUCAP 82 95 123* 95 101*   Lipid Profile: No results for input(s): CHOL, HDL, LDLCALC, TRIG, CHOLHDL, LDLDIRECT in the last 72 hours. Thyroid Function Tests: No results for input(s): TSH, T4TOTAL, FREET4, T3FREE, THYROIDAB in the last 72 hours. Anemia Panel: No results for input(s): VITAMINB12, FOLATE, FERRITIN, TIBC, IRON, RETICCTPCT in the last 72 hours. Sepsis Labs: Recent Labs  Lab 09/20/17 0426 09/20/17 8185 09/20/17 1042 09/21/17 0521 09/22/17 0743  PROCALCITON   --  4.83  --  3.06 1.06  LATICACIDVEN 1.79 2.6* 1.3 1.0  --     Recent Results (from the past 240 hour(s))  Blood Culture (routine x 2)     Status: None (Preliminary result)   Collection Time: 09/19/17 11:15 PM  Result Value Ref Range Status   Specimen Description   Final  BLOOD LEFT ARM UPPER Performed at [facility] [address], [place], [place] [address]    Special Requests   Final    BOTTLES DRAWN AEROBIC AND ANAEROBIC Blood Culture adequate volume Performed at [facility] [address], [place], [place] [address]    Culture   Final    NO GROWTH 4 DAYS Performed at [facility], [place] [address] [place], [place] [address]    Report Status PENDING  Incomplete  Urine culture     Status: Abnormal   Collection Time: 09/20/17  1:28 AM  Result Value Ref Range Status   Specimen Description   Final    URINE, CATHETERIZED Performed at [facility] [address], [place], [place] [address]    Special Requests   Final    NONE Performed at [facility], [place] [address] [place], [place] [address]    Culture (A)  Final    >=100,000 COLONIES/mL DIPHTHEROIDS(CORYNEBACTERIUM SPECIES)   Report Status 09/21/2017 FINAL  Final  MRSA PCR Screening     Status: Abnormal   Collection Time: 09/20/17 12:37 PM  Result Value Ref Range Status   MRSA by PCR POSITIVE (A) NEGATIVE Final    Comment:        The GeneXpert MRSA Assay (FDA approved for NASAL specimens only), is one component of a comprehensive MRSA colonization surveillance program. It is not intended to diagnose MRSA infection nor to guide or monitor treatment for MRSA infections. RESULT CALLED TO, READ BACK BY AND VERIFIED WITH: Alcide Clever [id number] @ 2947 BY J SCOTTON Performed at [facility] [address] [place], [place] [address]          Radiology Studies: Dg Chest Port 1 View  Result Date:  09/25/2017 CLINICAL DATA:  67 year old male with shortness breath. Subsequent encounter. EXAM: PORTABLE CHEST 1 VIEW COMPARISON:  09/24/2017 and 12/11/2016 chest x-ray. FINDINGS: Persistent slightly asymmetric airspace disease may represent pulmonary edema superimposed upon chronic lung changes. Infectious infiltrate secondary less likely consideration. No pneumothorax noted. Heart size within normal limits. Calcified tortuous aorta. Nasogastric tube tip gastric body/antrum junction. IMPRESSION: Similar appearance of slightly asymmetric airspace disease which may represent pulmonary edema superimposed upon chronic lung changes. Infectious infiltrate less likely consideration. Aortic Atherosclerosis (ICD10-I70.0). Electronically Signed   By: Genia Del M.D.   On: 09/25/2017 06:47   Dg Chest Port 1 View  Result Date: 09/24/2017 CLINICAL DATA:  Shortness of breath. EXAM: PORTABLE CHEST 1 VIEW COMPARISON:  Chest radiograph Sep 19, 2017 FINDINGS: Increasing interstitial and to lesser extent alveolar airspace opacities. Cardiac silhouette is upper limits of normal. Patient rotated LEFT. Mediastinal silhouette is nonsuspicious. Old bilateral rib fractures. Nasogastric tube tip projecting in distal stomach. IMPRESSION: Increasing interstitial and alveolar airspace opacities seen with pulmonary edema or pneumonia. Nasogastric tube tip projecting in distal stomach. Electronically Signed   By: Elon Alas M.D.   On: 09/24/2017 05:18        Scheduled Meds: . bisacodyl  10 mg Rectal Daily  . cyanocobalamin  1,000 mcg Intramuscular Daily  . furosemide  60 mg Intravenous Q8H  . mouth rinse  15 mL Mouth Rinse BID  . metoprolol tartrate  5 mg Intravenous Q8H  . mupirocin ointment  1 application Nasal BID  . polyethylene glycol  17 g Oral BID   Continuous Infusions: . chlorproMAZINE (THORAZINE) IV    . famotidine (PEPCID) IV Stopped (09/24/17 2248)  . magnesium sulfate  1 - 4 g bolus IVPB 4 g (09/25/17  0923)  . methocarbamol (ROBAXIN)  IV Stopped (09/25/17 0249)  . piperacillin-tazobactam (ZOSYN)  IV 3.375 g (09/25/17 0855)  . potassium chloride    . valproate sodium Stopped (09/25/17 8721)     LOS: 5 days    Time spent: 40 minutes    Irine Seal, MD [facility] Pager [phone]  If 7PM-7AM, please contact night-coverage [email or web address] Password [email or web address] 09/25/2017, 10:00 AM

## 2017-09-25 NOTE — Clinical Social Work Note (Signed)
Clinical Social Work Assessment  Patient Details  Name: Kevin Humphrey MRN: [id number] Date of Birth: 09-10-1950  Date of referral:  09/25/17               Reason for consult:  Facility Placement                Permission sought to share information with:  Family Supports Permission granted to share information::     Name::        Agency::  [facility]   Relationship::  Legal Guardian   Contact Information:     Housing/Transportation Living arrangements for the past 2 months:  [place] of Information:  Facility Patient Interpreter Needed:  None Criminal Activity/Legal Involvement Pertinent to Current Situation/Hospitalization:  No - Comment as needed Significant Relationships:  Warehouse manager Lives with:  Facility Resident Do you feel safe going back to the place where you live?  Yes Need for family participation in patient care:  Yes (DSS is her legal guardian)  Care giving concerns:   No care giving concerns a this time.   Social Worker assessment / plan:  CSW spoke with the patient LG-Kevin Humphrey. Patient is a ward of state and currently resides at [facility]. Patient will return to SNF at discharge. Kevin Humphrey request patient discharge summary be fax to:  [phone].  CSW spoke with patient nurse Maudie Mercury at [facility] for collateral information. She reports the patient is limited with all activities of daily living.She reports the patient can transfer to his wheel chair. She reports the patient yells out and can be agitated at times. The facility will accept patient at discharge.   CSW will assist with patient return to SNF and update LG at discharge.   Plan: SNF   Employment status:  Disabled (Comment on whether or not currently receiving Disability) Insurance information:  Managed Medicare PT Recommendations:  [place] / Referral to community resources:  [place]  Patient/Family's Response to care:  No family  at bedside.   Patient/Family's Understanding of and Emotional Response to Diagnosis, Current Treatment, and Prognosis:  No family at bedside.   Emotional Assessment Appearance:  Appears stated age Attitude/Demeanor/Rapport:    Affect (typically observed):  Unable to Assess Orientation:  Oriented to Self Alcohol / Substance use:  Not Applicable Psych involvement (Current and /or in the community):  No (Comment)  Discharge Needs  Concerns to be addressed:  Discharge Planning Concerns Readmission within the last 30 days:  No Current discharge risk:  Dependent with Mobility, Psychiatric Illness Barriers to Discharge:  Continued Medical Work up   [employer], LCSW 09/25/2017, 1:27 PM

## 2017-09-25 NOTE — Progress Notes (Signed)
OT Cancellation Note  Patient Details Name: Kevin Humphrey MRN: [id number] DOB: Nov 06, 1950   Cancelled Treatment:    Reason Eval/Treat Not Completed: Other (comment) Noted pt is from a SNF- will defer OT eval to facility.    Kari Baars, [place] Payton Mccallum D 09/25/2017, 8:53 AM

## 2017-09-25 NOTE — Progress Notes (Signed)
SLP Cancellation Note  Patient Details Name: Myer Bohlman MRN: [id number] DOB: 03-Sep-1950   Cancelled treatment:       Reason Eval/Treat Not Completed: Fatigue/lethargy limiting ability to participate(pt too lethargic for po at this time, he had morphine, robaxan, Zofran last pm, will continue efforts)   Macario Golds 09/25/2017, 12:04 PM  Luanna Salk, [facility] SLP [phone]

## 2017-09-25 NOTE — Progress Notes (Signed)
[facility] Progress Note  3 Days Post-Op  Subjective: CC: Denies abdominal pain or vomiting. Per nurse, pt passed some hard stool balls yesterday but she had to assist with some manual disimpaction. Urinating a lot 2/2 lasix. Stood up with PT yesterday.  Objective: Vital signs in last 24 hours: Temp:  [97.1 F (36.2 C)-99.9 F (37.7 C)] 99.9 F (37.7 C) (06/04 0709) Pulse Rate:  [70-104] 98 (06/04 0406) Resp:  [14-23] 16 (06/04 0406) BP: (136-201)/(50-104) 186/84 (06/04 0406) SpO2:  [93 %-100 %] 93 % (06/04 0406) Weight:  [75.2 kg (165 lb 12.6 oz)-76.1 kg (167 lb 12.3 oz)] 75.2 kg (165 lb 12.6 oz) (06/04 0229) Last BM Date: 09/24/17  Intake/Output from previous day: 06/03 0701 - 06/04 0700 In: 622.5 [IV Piggyback:622.5] Out: 1985 [WUJWJ:1914; Emesis/NG output:10] Intake/Output this shift: No intake/output data recorded.  PE: Gen:  Alert, NAD, cooperative Pulm:  Normal effort Abd: Soft, non-tender, non-distended, bowel sounds present, no HSM, incisions C/D/I GU: condom cath Skin: warm and dry, no rashes  Psych: A&Ox3   Lab Results:  Recent Labs    09/23/17 0343 09/24/17 0513  WBC 12.5* 14.2*  HGB 8.9* 8.8*  HCT 25.2* 26.6*  PLT 260 245   BMET Recent Labs    09/23/17 0343 09/24/17 0513  NA 143 143  K 4.3 3.8  CL 114* 112*  CO2 21* 21*  GLUCOSE 102* 88  BUN 20 19  CREATININE 0.85 0.83  CALCIUM 8.0* 8.2*   CMP     Component Value Date/Time   NA 143 09/24/2017 0513   NA 137 08/27/2015   K 3.8 09/24/2017 0513   CL 112 (H) 09/24/2017 0513   CO2 21 (L) 09/24/2017 0513   GLUCOSE 88 09/24/2017 0513   BUN 19 09/24/2017 0513   BUN 16 08/27/2015   CREATININE 0.83 09/24/2017 0513   CALCIUM 8.2 (L) 09/24/2017 0513   PROT 6.6 09/20/2017 0642   ALBUMIN 3.4 (L) 09/20/2017 0642   AST 27 09/20/2017 0642   ALT 21 09/20/2017 0642   ALKPHOS 51 09/20/2017 0642   BILITOT 0.7 09/20/2017 0642   GFRNONAA >60 09/24/2017 0513   GFRAA >60 09/24/2017 0513    Lipase     Component Value Date/Time   LIPASE 19 09/19/2017 2324   Studies/Results: Dg Chest Port 1 View  Result Date: 09/25/2017 CLINICAL DATA:  67 year old male with shortness breath. Subsequent encounter. EXAM: PORTABLE CHEST 1 VIEW COMPARISON:  09/24/2017 and 12/11/2016 chest x-ray. FINDINGS: Persistent slightly asymmetric airspace disease may represent pulmonary edema superimposed upon chronic lung changes. Infectious infiltrate secondary less likely consideration. No pneumothorax noted. Heart size within normal limits. Calcified tortuous aorta. Nasogastric tube tip gastric body/antrum junction. IMPRESSION: Similar appearance of slightly asymmetric airspace disease which may represent pulmonary edema superimposed upon chronic lung changes. Infectious infiltrate less likely consideration. Aortic Atherosclerosis (ICD10-I70.0). Electronically Signed   By: Genia Del M.D.   On: 09/25/2017 06:47   Dg Chest Port 1 View  Result Date: 09/24/2017 CLINICAL DATA:  Shortness of breath. EXAM: PORTABLE CHEST 1 VIEW COMPARISON:  Chest radiograph Sep 19, 2017 FINDINGS: Increasing interstitial and to lesser extent alveolar airspace opacities. Cardiac silhouette is upper limits of normal. Patient rotated LEFT. Mediastinal silhouette is nonsuspicious. Old bilateral rib fractures. Nasogastric tube tip projecting in distal stomach. IMPRESSION: Increasing interstitial and alveolar airspace opacities seen with pulmonary edema or pneumonia. Nasogastric tube tip projecting in distal stomach. Electronically Signed   By: Elon Alas M.D.   On: 09/24/2017  05:18    Anti-infectives: Anti-infectives (From admission, onward)   Start     Dose/Rate Route Frequency Ordered Stop   09/22/17 1000  cefoTEtan (CEFOTAN) 2 g in sodium chloride 0.9 % 100 mL IVPB     2 g 200 mL/hr over 30 Minutes Intravenous On call to O.R. 09/22/17 0852 09/22/17 1600   09/21/17 1700  vancomycin (VANCOCIN) IVPB 750 mg/150 ml premix   Status:  Discontinued     750 mg 150 mL/hr over 60 Minutes Intravenous Every 12 hours 09/21/17 0956 09/22/17 0959   09/21/17 0600  vancomycin (VANCOCIN) IVPB 1000 mg/200 mL premix  Status:  Discontinued     1,000 mg 200 mL/hr over 60 Minutes Intravenous Every 24 hours 09/20/17 0628 09/20/17 0921   09/21/17 0600  vancomycin (VANCOCIN) IVPB 750 mg/150 ml premix  Status:  Discontinued     750 mg 150 mL/hr over 60 Minutes Intravenous Every 24 hours 09/20/17 0921 09/21/17 0956   09/20/17 0800  piperacillin-tazobactam (ZOSYN) IVPB 3.375 g     3.375 g 12.5 mL/hr over 240 Minutes Intravenous Every 8 hours 09/20/17 0612     09/20/17 0130  vancomycin (VANCOCIN) 1,500 mg in sodium chloride 0.9 % 500 mL IVPB     1,500 mg 250 mL/hr over 120 Minutes Intravenous  Once 09/20/17 0100 09/20/17 0453   09/20/17 0100  piperacillin-tazobactam (ZOSYN) IVPB 3.375 g     3.375 g 100 mL/hr over 30 Minutes Intravenous  Once 09/20/17 0047 09/20/17 0214   09/20/17 0100  vancomycin (VANCOCIN) IVPB 1000 mg/200 mL premix  Status:  Discontinued     1,000 mg 200 mL/hr over 60 Minutes Intravenous  Once 09/20/17 0047 09/20/17 0100     Assessment/Plan ARF Aspiration PNA UTI Anemia, normocytic - no overt bleeding. follow H&H, hgb 8.8 from 8.9; on iron per primary team  HTN Schizophrenia  Dementia  Malnutrition   SBO 2/2 adehesions from previous laparotomy S/P LAPAROSCOPICLYSIS OF ADHESIONS 09/22/17 [name]  -  POD#3, afebrile  -  Tolerated NGT clamping trial without emesis. Having some BMs. I suspect some element of conspitations at baseline -  start clear liquids -  Start bowel regimen  -  Mobilize, IS  FEN: CLD, IVF ID: Zosyn (UTI/PNA) 5/30>>, WBC 18 from 14 VTE: SCD's  Follow up: Dr. Michael Boston      LOS: 5 days    Jill Alexanders , [facility] 09/25/2017, 7:32 AM Pager: [phone] Consults: [phone] Mon-Fri 7:00 am-4:30 pm Sat-Sun 7:00 am-11:30 am

## 2017-09-26 ENCOUNTER — Inpatient Hospital Stay (HOSPITAL_COMMUNITY): Payer: Medicare Other

## 2017-09-26 ENCOUNTER — Encounter (HOSPITAL_COMMUNITY): Payer: Self-pay | Admitting: Radiology

## 2017-09-26 DIAGNOSIS — E876 Hypokalemia: Secondary | ICD-10-CM

## 2017-09-26 DIAGNOSIS — N390 Urinary tract infection, site not specified: Secondary | ICD-10-CM

## 2017-09-26 LAB — BASIC METABOLIC PANEL
ANION GAP: 9 (ref 5–15)
BUN: 21 mg/dL — ABNORMAL HIGH (ref 6–20)
CHLORIDE: 103 mmol/L (ref 101–111)
CO2: 35 mmol/L — AB (ref 22–32)
Calcium: 8.6 mg/dL — ABNORMAL LOW (ref 8.9–10.3)
Creatinine, Ser: 1.03 mg/dL (ref 0.61–1.24)
GFR calc non Af Amer: >60 mL/min (ref >60)
Glucose, Bld: 101 mg/dL — ABNORMAL HIGH (ref 65–99)
POTASSIUM: 3.3 mmol/L — AB (ref 3.5–5.1)
Sodium: 147 mmol/L — ABNORMAL HIGH (ref 135–145)

## 2017-09-26 LAB — GLUCOSE, CAPILLARY
GLUCOSE-CAPILLARY: 98 mg/dL (ref 65–99)
Glucose-Capillary: 98 mg/dL (ref 65–99)
Glucose-Capillary: 99 mg/dL (ref 65–99)

## 2017-09-26 LAB — BLOOD GAS, ARTERIAL
Acid-base deficit: 1.3 mmol/L (ref 0.0–2.0)
Bicarbonate: 22.3 mmol/L (ref 20.0–28.0)
Drawn by: 51425
O2 CONTENT: 6 L/min
O2 Saturation: 95.2 %
PH ART: 7.411 (ref 7.350–7.450)
Patient temperature: 98.6
pCO2 arterial: 35.7 mmHg (ref 32.0–48.0)
pO2, Arterial: 76.4 mmHg — ABNORMAL LOW (ref 83.0–108.0)

## 2017-09-26 LAB — MAGNESIUM: Magnesium: 2.3 mg/dL (ref 1.7–2.4)

## 2017-09-26 LAB — CBC
HEMATOCRIT: 27.9 % — AB (ref 39.0–52.0)
Hemoglobin: 9.4 g/dL — ABNORMAL LOW (ref 13.0–17.0)
MCH: 30.7 pg (ref 26.0–34.0)
MCHC: 33.7 g/dL (ref 30.0–36.0)
MCV: 91.2 fL (ref 78.0–100.0)
Platelets: 408 10*3/uL — ABNORMAL HIGH (ref 150–400)
RBC: 3.06 MIL/uL — AB (ref 4.22–5.81)
RDW: 14.8 % (ref 11.5–15.5)
WBC: 17.7 10*3/uL — ABNORMAL HIGH (ref 4.0–10.5)

## 2017-09-26 MED ORDER — SORBITOL 70 % SOLN
960.0000 mL | TOPICAL_OIL | Freq: Once | ORAL | Status: DC
  Filled 2017-09-26: qty 473

## 2017-09-26 MED ORDER — CHLORHEXIDINE GLUCONATE CLOTH 2 % EX PADS
6.0000 | MEDICATED_PAD | Freq: Every day | CUTANEOUS | Status: AC
  Administered 2017-09-27 – 2017-10-01 (×5): 6 via TOPICAL

## 2017-09-26 MED ORDER — POTASSIUM CHLORIDE 20 MEQ/15ML (10%) PO SOLN: 40.0000 meq | Freq: Two times a day (BID) | ORAL | Status: DC

## 2017-09-26 MED ORDER — MUPIROCIN 2 % EX OINT
1.0000 "application " | TOPICAL_OINTMENT | Freq: Two times a day (BID) | CUTANEOUS | Status: AC
  Administered 2017-09-26 – 2017-10-01 (×10): 1 via NASAL
  Filled 2017-09-26: qty 22

## 2017-09-26 MED ORDER — POTASSIUM CHLORIDE 10 MEQ/100ML IV SOLN
10.0000 meq | INTRAVENOUS | Status: AC
  Administered 2017-09-26 (×5): 10 meq via INTRAVENOUS
  Filled 2017-09-26 (×5): qty 100

## 2017-09-26 NOTE — Evaluation (Signed)
SLP Cancellation Note  Patient Details Name: Kevin Humphrey MRN: [id number] DOB: 02-Mar-1951   Cancelled treatment:       Reason Eval/Treat Not Completed: Other (comment);Medical issues which prohibited therapy(pt with AMS- not consistently following directions)   Macario Golds 09/26/2017, 9:06 AM  Luanna Salk, [facility] SLP [phone]

## 2017-09-26 NOTE — Progress Notes (Signed)
[facility] Progress Note  4 Days Post-Op  Subjective: CC:  Comfortable, no new complaints. Getting up to chair w/ PT. 5 BMs overnight s/p disimpaction yesterday. Per nurse, holding miralax in his mouth and not swallowing well - SLP unable to perform eval yesterday 2/2 pt lethargy.  Objective: Vital signs in last 24 hours: Temp:  [98.3 F (36.8 C)-99.8 F (37.7 C)] 98.6 F (37 C) (06/05 0328) Pulse Rate:  [67-105] 67 (06/05 0519) Resp:  [13-24] 18 (06/05 0519) BP: (134-180)/(43-86) 135/43 (06/05 0519) SpO2:  [92 %-96 %] 96 % (06/05 0519) Weight:  [70.5 kg (155 lb 6.8 oz)] 70.5 kg (155 lb 6.8 oz) (06/05 0201) Last BM Date: 09/26/17  Intake/Output from previous day: 06/04 0701 - 06/05 0700 In: 700 [IV Piggyback:700] Out: 6270 [Urine:5330; Stool:1] Intake/Output this shift: No intake/output data recorded.  PE: Gen:  Alert, NAD, cooperative Pulm:  Normal effort Abd: Soft, non-tender, non-distended, bowel sounds present, no HSM, incisions C/D/I GU: condom cath Skin: warm and dry, no rashes  Psych: A&Ox3   Lab Results:  Recent Labs    09/25/17 0758 09/26/17 0259  WBC 18.1* 17.7*  HGB 9.8* 9.4*  HCT 29.5* 27.9*  PLT 402* 408*   BMET Recent Labs    09/25/17 0758 09/26/17 0259  NA 143 147*  K 3.7 3.3*  CL 105 103  CO2 25 35*  GLUCOSE 97 101*  BUN 19 21*  CREATININE 0.93 1.03  CALCIUM 8.4* 8.6*   PT/INR No results for input(s): LABPROT, INR in the last 72 hours. CMP     Component Value Date/Time   NA 147 (H) 09/26/2017 0259   NA 137 08/27/2015   K 3.3 (L) 09/26/2017 0259   CL 103 09/26/2017 0259   CO2 35 (H) 09/26/2017 0259   GLUCOSE 101 (H) 09/26/2017 0259   BUN 21 (H) 09/26/2017 0259   BUN 16 08/27/2015   CREATININE 1.03 09/26/2017 0259   CALCIUM 8.6 (L) 09/26/2017 0259   PROT 6.6 09/20/2017 0642   ALBUMIN 3.4 (L) 09/20/2017 0642   AST 27 09/20/2017 0642   ALT 21 09/20/2017 0642   ALKPHOS 51 09/20/2017 0642   BILITOT 0.7 09/20/2017  0642   GFRNONAA >60 09/26/2017 0259   GFRAA >60 09/26/2017 0259   Lipase     Component Value Date/Time   LIPASE 19 09/19/2017 2324       Studies/Results: Dg Chest Port 1 View  Result Date: 09/25/2017 CLINICAL DATA:  67 year old male with shortness breath. Subsequent encounter. EXAM: PORTABLE CHEST 1 VIEW COMPARISON:  09/24/2017 and 12/11/2016 chest x-ray. FINDINGS: Persistent slightly asymmetric airspace disease may represent pulmonary edema superimposed upon chronic lung changes. Infectious infiltrate secondary less likely consideration. No pneumothorax noted. Heart size within normal limits. Calcified tortuous aorta. Nasogastric tube tip gastric body/antrum junction. IMPRESSION: Similar appearance of slightly asymmetric airspace disease which may represent pulmonary edema superimposed upon chronic lung changes. Infectious infiltrate less likely consideration. Aortic Atherosclerosis (ICD10-I70.0). Electronically Signed   By: Genia Del M.D.   On: 09/25/2017 06:47    Anti-infectives: Anti-infectives (From admission, onward)   Start     Dose/Rate Route Frequency Ordered Stop   09/22/17 1000  cefoTEtan (CEFOTAN) 2 g in sodium chloride 0.9 % 100 mL IVPB     2 g 200 mL/hr over 30 Minutes Intravenous On call to O.R. 09/22/17 0852 09/22/17 1600   09/21/17 1700  vancomycin (VANCOCIN) IVPB 750 mg/150 ml premix  Status:  Discontinued     750 mg  150 mL/hr over 60 Minutes Intravenous Every 12 hours 09/21/17 0956 09/22/17 0959   09/21/17 0600  vancomycin (VANCOCIN) IVPB 1000 mg/200 mL premix  Status:  Discontinued     1,000 mg 200 mL/hr over 60 Minutes Intravenous Every 24 hours 09/20/17 0628 09/20/17 0921   09/21/17 0600  vancomycin (VANCOCIN) IVPB 750 mg/150 ml premix  Status:  Discontinued     750 mg 150 mL/hr over 60 Minutes Intravenous Every 24 hours 09/20/17 0921 09/21/17 0956   09/20/17 0800  piperacillin-tazobactam (ZOSYN) IVPB 3.375 g     3.375 g 12.5 mL/hr over 240 Minutes  Intravenous Every 8 hours 09/20/17 0612     09/20/17 0130  vancomycin (VANCOCIN) 1,500 mg in sodium chloride 0.9 % 500 mL IVPB     1,500 mg 250 mL/hr over 120 Minutes Intravenous  Once 09/20/17 0100 09/20/17 0453   09/20/17 0100  piperacillin-tazobactam (ZOSYN) IVPB 3.375 g     3.375 g 100 mL/hr over 30 Minutes Intravenous  Once 09/20/17 0047 09/20/17 0214   09/20/17 0100  vancomycin (VANCOCIN) IVPB 1000 mg/200 mL premix  Status:  Discontinued     1,000 mg 200 mL/hr over 60 Minutes Intravenous  Once 09/20/17 0047 09/20/17 0100     Assessment/Plan ARF Aspiration PNA UTI Anemia, normocytic - no overt bleeding. follow H&H, hgb 8.8 from 8.9; on iron per primary team  HTN Schizophrenia  Dementia  Malnutrition   SBO 2/2 adehesions from previous laparotomy S/PLAPAROSCOPICLYSIS OF ADHESIONS6/1/19 [name]  - POD#4, afebrile  -  Tolerated NGT clamping trial without emesis. Having BMs. I suspect some element of conspitations at baseline -  would typically start clears and advance to SOFT as tolerated, however pt needs to be cleared buy speech today first. -  Start bowel regimen  - Mobilize, IS  FEN: NPO - ok to start a diet pending SLP clearance; hypokalemia (3.3) - replace IV given unsure pt can swallow safely. ID: Zosyn (UTI/PNA) 5/30>>, WBC 17.7 from 18 VTE: SCD's Follow up: Dr. Michael Boston     LOS: 6 days    Jill Alexanders , [facility] 09/26/2017, 7:41 AM Pager: [phone] Consults: [phone] Mon-Fri 7:00 am-4:30 pm Sat-Sun 7:00 am-11:30 am

## 2017-09-26 NOTE — Plan of Care (Signed)
  Problem: Skin Integrity: Goal: Risk for impaired skin integrity will decrease Outcome: Progressing   Problem: Activity: Goal: Risk for activity intolerance will decrease Outcome: Not Progressing   Problem: Nutrition: Goal: Adequate nutrition will be maintained Outcome: Not Progressing Note:  Pt with mental status changes

## 2017-09-26 NOTE — Progress Notes (Signed)
PROGRESS NOTE    Kevin Humphrey  WUJ:[id number] DOB: October 06, 1950 DOA: 09/19/2017 PCP: [name], MD   Brief Narrative:  Kevin Jonesis a 67 y.o.malewithhistory of advanced dementia, schizophrenia, hypertension was brought from the skilled nursing facility after patient was found to have persistent nausea vomiting with hematemesis. Chest x-ray done at this nursing facility was showing some infiltrates and abdomen x-ray was showing concerning for bowel obstruction.  In the ER stool for occult blood was positive. Abdomen was distended and initially patient on arrival was confused and minimally responsive but somewhat improved after starting antibiotics and fluids. CT abdomen pelvis shows bowel obstruction with transition point. Chest x-ray was showing possibility of infiltrates. Patient was hypotensive improved with fluids and empiric antibiotic started for sepsis likely from aspiration. Underwent Laparoscopic Lysis of Adhesions for SBO and improved but was more Encephalopathic and not following commands.    Assessment & Plan:   Principal Problem:   SBO (small bowel obstruction) s/p lap adhesiolysis 09/22/2017 Active Problems:   Schizophrenia ([place])   Malnutrition of moderate degree (HCC)   Acute respiratory failure with hypoxia (HCC)   ARF (acute renal failure) (HCC)   Hematemesis   Sepsis (HCC)   Anxiety state   Small bowel obstruction (HCC)   Hypomagnesemia   Acute lower UTI   Acute metabolic encephalopathy   Ileus (HCC)   Volume overload   Hypokalemia  High-grade small bowel obstruction that is post laparoscopic lysis of adhesions 09/22/2017 with subsequent Ileus -With small bowel obstruction secondary to adhesions.   -Patient status post laparoscopic lysis of adhesions 09/22/2017 prior [name].   -Patient now likely with an ileus with no bowel movements and no flatus and hypoactive bowel sounds as of 09/23/2017.   -Patient noted to have some harder balls of stool  a few days ago but now having looser stools and given SMOG yesterday -NG tube has been removed per general surgery and patient is to be started on some clears today pending SLP Clearance. SLP unable to evaluate again today due to Lethargy and AMS -Keep magnesium greater than 2.  Keep potassium greater than 4.   -Saline lock IV fluids secondary to volume overload.   -Supportive care. Pain Control Per General Surgery. -C/w Robaxin 1000 mg po q6hprn Muscle spasms   Acute respiratory failure with hypoxia secondary to volume overload -Patient noted to have worsening shortness of breath the night of 09/23/2017 to the morning of 09/24/2017, with increased O2 requirements requiring 8 L of high flow nasal cannula.   -Chest x-ray obtained consistent with volume overload.   -Urine output not accurately recorded.  -Weight is down to 155 -Patient is + 162 mL since Admission   -Increased Lasix to 60 mg IV every 8 hours and pending volume status could likely decrease diuretic dose tomorrow.   -Continue strict I's and O's.  Daily weights.    Acute Metabolic Encephalopathy in the setting of Advanced Dementia -Was suspected to Likely drug-induced secondary to 2 mg of iv Dilaudid given 09/23/2017.   -Patient now alert and following commands and likely close to baseline yesterday 6/4 but not today; ? Related to IV Morphine? -Obtain Head CT w/o Contrast: Showed No acute intracranial abnormality. Mild cerebral atrophy. Chronic sinusitis. -Palliative Care Consulted for further evaluation and for [place] [facility]  Acute Renal Failure -Likely secondary to prerenal azotemia in the setting of ACE inhibitor.   -Patient noted on admission to be hypotensive.   -Patient hydrated aggressively with IV fluids.  Renal function improved and currently at baseline. BUN/Cr is 21/1.03 -Patient now noted to be in volume overload.  Patient on IV Lasix and will D/C now.   -Monitor renal function closely with diuresis.   -Continue to hold  ACE inhibitor and avoid nephrotoxins.  -Repeat CMP in AM   Sepsis likely secondary to aspiration pneumonia and Diphtheroids UTI -Per CT abdomen and pelvis as well as chest x-ray concerning for probable pneumonia.   -Patient on admission noted to be septic with hypotension, acute renal failure, tachycardia, elevated lactic acid level, elevated procalcitonin level.   -Lactic acid level and pro calcitonin levels trending down.   -Urine cultures with > 100000 colonies of diphtheroids.   -Blood cultures pending with no growth to date.  IV vancomycin has been discontinued.   -Continue IV Zosyn D6/7.   -Continue to Monitor for S/Sx of Infection and Follow.  Anemia/Hematemesis -Likely dilutional in nature.   -Patient on admission was noted to have some hematemesis however no hematemesis noted during the hospitalization.   -Patient with no overt bleeding.   -Anemia panel has been checked and iron level at 11.  Ferritin at 142.  Vitamin B12 levels at 282.   -Continue vitamin B12 IM injections.  -Hb/Hct now is 9.4/27.9 -Patient s/p IV Feraheme.  -Follow H&H, transfusion threshold hemoglobin less than 8.  -Will likely need oral iron supplementation on discharge.   -Continue PPI.  -Continue to Monitor for S/Sx of Bleeding -Repeat CBC in AM   Hypertension -On admission patient noted to be septic with hypotension.   -Blood pressure improved.  BP elevated.   -Antihypertensive medications on hold secondary to problem #1.   -Continue IV Lopressor.   -Continued IV Lasix for volume overload and now stopped .   Schizophrenia -Stable.   -Continue IV Depakote 250 mg q8h.   -IV Haldol as needed. -Delirium Precautions  Hypomagnesemia -Improved and is now 2.3 -Continue to Monitor and Replete as Necessary' -Repeat Mag Level in AM   Hypernatremia -Mild at 147 -? From Overdiuresis -Will stop IV Lasix now  -Repeat CMP in AM   Hypokalemia -Patient's potassium this morning was 3.3 next  line -Replete with IV potassium chloride -Continue to monitor and replete as necessary -Repeat CMP in a.m.   DVT prophylaxis: SCDs Code Status: FULL CODE Family Communication: No family present at bedside  Disposition Plan: Remain Inpatient for Further workup and evaluation.  Consultants:   General Surgery  Palliative Care    Procedures: S/p Lysis of Adhesions    Antimicrobials:  Anti-infectives (From admission, onward)   Start     Dose/Rate Route Frequency Ordered Stop   09/22/17 1000  cefoTEtan (CEFOTAN) 2 g in sodium chloride 0.9 % 100 mL IVPB     2 g 200 mL/hr over 30 Minutes Intravenous On call to O.R. 09/22/17 0852 09/22/17 1600   09/21/17 1700  vancomycin (VANCOCIN) IVPB 750 mg/150 ml premix  Status:  Discontinued     750 mg 150 mL/hr over 60 Minutes Intravenous Every 12 hours 09/21/17 0956 09/22/17 0959   09/21/17 0600  vancomycin (VANCOCIN) IVPB 1000 mg/200 mL premix  Status:  Discontinued     1,000 mg 200 mL/hr over 60 Minutes Intravenous Every 24 hours 09/20/17 0628 09/20/17 0921   09/21/17 0600  vancomycin (VANCOCIN) IVPB 750 mg/150 ml premix  Status:  Discontinued     750 mg 150 mL/hr over 60 Minutes Intravenous Every 24 hours 09/20/17 0921 09/21/17 0956   09/20/17 0800  piperacillin-tazobactam (  ZOSYN) IVPB 3.375 g     3.375 g 12.5 mL/hr over 240 Minutes Intravenous Every 8 hours 09/20/17 0612     09/20/17 0130  vancomycin (VANCOCIN) 1,500 mg in sodium chloride 0.9 % 500 mL IVPB     1,500 mg 250 mL/hr over 120 Minutes Intravenous  Once 09/20/17 0100 09/20/17 0453   09/20/17 0100  piperacillin-tazobactam (ZOSYN) IVPB 3.375 g     3.375 g 100 mL/hr over 30 Minutes Intravenous  Once 09/20/17 0047 09/20/17 0214   09/20/17 0100  vancomycin (VANCOCIN) IVPB 1000 mg/200 mL premix  Status:  Discontinued     1,000 mg 200 mL/hr over 60 Minutes Intravenous  Once 09/20/17 0047 09/20/17 0100     Subjective: Examined at bedside and was encephalopathic and not able to  participate will provide a subjective history.  Does not follow commands and did not open eyes during entire encounter.  Objective: Vitals:   09/26/17 0201 09/26/17 0328 09/26/17 0400 09/26/17 0519  BP:   (!) 179/86 (!) 135/43  Pulse:   81 67  Resp:   20 18  Temp:  98.6 F (37 C)    TempSrc:  Axillary    SpO2:   92% 96%  Weight: 70.5 kg (155 lb 6.8 oz)     Height:        Intake/Output Summary (Last 24 hours) at 09/26/2017 0723 Last data filed at 09/26/2017 0400 Gross per 24 hour  Intake 700 ml  Output 5331 ml  Net -4631 ml   Filed Weights   09/24/17 1700 09/25/17 0229 09/26/17 0201  Weight: 76.1 kg (167 lb 12.3 oz) 75.2 kg (165 lb 12.6 oz) 70.5 kg (155 lb 6.8 oz)   Examination: Physical Exam:  Constitutional: Thin Demented Caucasian male who is in NAD and appears calm but unable to participate in exam or provide a subjective History.  Eyes: Lids normal. ENMT: External Ears, Nose appear normal.  Neck: Appears normal, supple, no cervical masses, normal ROM, no appreciable thyromegaly, no JVD Respiratory: Diminished to auscultation bilaterally, no wheezing, rales, rhonchi or crackles. Normal respiratory effort and patient is not tachypenic. No accessory muscle use.  Cardiovascular: RRR, no murmurs / rubs / gallops. S1 and S2 auscultated.  Abdomen: Soft, non-tender, non-distended. No masses palpated. No appreciable hepatosplenomegaly. Bowel sounds positive x4. Has 3 small incisions covered.  GU: Deferred. Has a foley in place currently  Musculoskeletal: No clubbing / cyanosis of digits/nails. No joint deformity upper and lower extremities.  Skin: No rashes, lesions, ulcers on a limited skin eval. No induration; Warm and dry.  Neurologic: Does not follow commands but withdraws to painful stimuli.  Psychiatric: Impaired judgment and insight. Not Alert and oriented x 3.  Data Reviewed: I have personally reviewed following labs and imaging studies  CBC: Recent Labs  Lab  09/19/17 2324  09/20/17 0998 09/21/17 0521 09/22/17 0743 09/23/17 0343 09/24/17 0513 09/25/17 0758 09/26/17 0259  WBC 10.0  --  10.7* 12.0* 11.3* 12.5* 14.2* 18.1* 17.7*  NEUTROABS 8.2*  --  8.6* 9.6* 9.2* 11.1*  --   --   --   HGB 15.6   < > 13.7 9.8* 8.3* 8.9* 8.8* 9.8* 9.4*  HCT 44.8   < > 39.3 29.3* 25.1* 25.2* 26.6* 29.5* 27.9*  MCV 92.0  --  92.0 92.7 94.0 92.3 93.7 92.5 91.2  PLT 420*  --  350 241 207 260 245 402* 408*   < > = values in this interval not displayed.   Basic  Metabolic Panel: Recent Labs  Lab 09/21/17 0521 09/22/17 0743 09/23/17 0343 09/24/17 0513 09/25/17 0758 09/26/17 0259  NA 140 141 143 143 143 147*  K 3.9 3.0* 4.3 3.8 3.7 3.3*  CL 108 114* 114* 112* 105 103  CO2 27 20* 21* 21* 25 35*  GLUCOSE 109* 127* 102* 88 97 101*  BUN 26* 18 20 19 19  21*  CREATININE 1.11 0.80 0.85 0.83 0.93 1.03  CALCIUM 7.8* 6.7* 8.0* 8.2* 8.4* 8.6*  MG 2.5*  --  1.9 1.7 2.1 2.3   GFR: Estimated Creatinine Clearance: 68.4 mL/min (by C-G formula based on SCr of 1.03 mg/dL). Liver Function Tests: Recent Labs  Lab 09/19/17 2324 09/20/17 0642  AST 24 27  ALT 19 21  ALKPHOS 66 51  BILITOT 0.6 0.7  PROT 7.9 6.6  ALBUMIN 4.1 3.4*   Recent Labs  Lab 09/19/17 2324  LIPASE 19   Recent Labs  Lab 09/20/17 0642  AMMONIA 24   Coagulation Profile: Recent Labs  Lab 09/20/17 0642  INR 1.14  1.06   Cardiac Enzymes: Recent Labs  Lab 09/19/17 2324 09/20/17 0642 09/20/17 1245 09/20/17 1843  TROPONINI <0.03 0.03* 0.03* <0.03   BNP (last 3 results) No results for input(s): PROBNP in the last 8760 hours. HbA1C: No results for input(s): HGBA1C in the last 72 hours. CBG: Recent Labs  Lab 09/25/17 0616 09/25/17 1159 09/25/17 1816 09/25/17 2347 09/26/17 0541  GLUCAP 101* 104* 110* 106* 98   Lipid Profile: No results for input(s): CHOL, HDL, LDLCALC, TRIG, CHOLHDL, LDLDIRECT in the last 72 hours. Thyroid Function Tests: No results for input(s): TSH,  T4TOTAL, FREET4, T3FREE, THYROIDAB in the last 72 hours. Anemia Panel: No results for input(s): VITAMINB12, FOLATE, FERRITIN, TIBC, IRON, RETICCTPCT in the last 72 hours. Sepsis Labs: Recent Labs  Lab 09/20/17 0426 09/20/17 0932 09/20/17 1042 09/21/17 0521 09/22/17 0743  PROCALCITON  --  4.83  --  3.06 1.06  LATICACIDVEN 1.79 2.6* 1.3 1.0  --     Recent Results (from the past 240 hour(s))  Blood Culture (routine x 2)     Status: None   Collection Time: 09/19/17 11:15 PM  Result Value Ref Range Status   Specimen Description   Final    BLOOD LEFT ARM UPPER Performed at [facility] [address], [place], [place] [address]    Special Requests   Final    BOTTLES DRAWN AEROBIC AND ANAEROBIC Blood Culture adequate volume Performed at [facility] [address], [place], [place] [address]    Culture   Final    NO GROWTH 5 DAYS Performed at [facility], [place] [address] [place], [place] [address]    Report Status 09/25/2017 FINAL  Final  Urine culture     Status: Abnormal   Collection Time: 09/20/17  1:28 AM  Result Value Ref Range Status   Specimen Description   Final    URINE, CATHETERIZED Performed at [facility] [address], [place], [place] [address]    Special Requests   Final    NONE Performed at [facility], [place] [address] [place],  [address]    Culture (A)  Final    >=100,000 COLONIES/mL DIPHTHEROIDS(CORYNEBACTERIUM SPECIES)   Report Status 09/21/2017 FINAL  Final  MRSA PCR Screening     Status: Abnormal   Collection Time: 09/20/17 12:37 PM  Result Value Ref Range Status   MRSA by PCR POSITIVE (A) NEGATIVE Final  Comment:        The GeneXpert MRSA Assay (FDA approved for NASAL specimens only), is one component of a comprehensive MRSA colonization surveillance program. It is not intended to diagnose MRSA infection nor to guide or monitor treatment for MRSA  infections. RESULT CALLED TO, READ BACK BY AND VERIFIED WITH: Alcide Clever [id number] @ 4536 BY J SCOTTON Performed at [facility] [address], [place], [place] [address]     Radiology Studies: Dg Chest Port 1 View  Result Date: 09/25/2017 CLINICAL DATA:  67 year old male with shortness breath. Subsequent encounter. EXAM: PORTABLE CHEST 1 VIEW COMPARISON:  09/24/2017 and 12/11/2016 chest x-ray. FINDINGS: Persistent slightly asymmetric airspace disease may represent pulmonary edema superimposed upon chronic lung changes. Infectious infiltrate secondary less likely consideration. No pneumothorax noted. Heart size within normal limits. Calcified tortuous aorta. Nasogastric tube tip gastric body/antrum junction. IMPRESSION: Similar appearance of slightly asymmetric airspace disease which may represent pulmonary edema superimposed upon chronic lung changes. Infectious infiltrate less likely consideration. Aortic Atherosclerosis (ICD10-I70.0). Electronically Signed   By: Genia Del M.D.   On: 09/25/2017 06:47   Scheduled Meds: . bisacodyl  10 mg Rectal Daily  . cyanocobalamin  1,000 mcg Intramuscular Daily  . furosemide  60 mg Intravenous Q8H  . mouth rinse  15 mL Mouth Rinse BID  . metoprolol tartrate  5 mg Intravenous Q8H  . polyethylene glycol  17 g Oral BID   Continuous Infusions: . chlorproMAZINE (THORAZINE) IV    . famotidine (PEPCID) IV Stopped (09/25/17 2156)  . methocarbamol (ROBAXIN)  IV Stopped (09/26/17 0030)  . piperacillin-tazobactam (ZOSYN)  IV Stopped (09/26/17 0500)  . valproate sodium Stopped (09/26/17 0620)    LOS: 6 days   Kerney Elbe, DO [facility] Pager [phone]  If 7PM-7AM, please contact night-coverage [email or web address] Password [email or web address] 09/26/2017, 7:23 AM

## 2017-09-26 NOTE — Progress Notes (Signed)
Pt will not verbalize this am. Pt followed commands on initial assessment this morning. Now pt will open and close eyes repetitively and turning in bed. Pt will not follow commands but withdraws from pain. MD made aware new orders received will continue to monitor.

## 2017-09-27 ENCOUNTER — Inpatient Hospital Stay (HOSPITAL_COMMUNITY)
Admit: 2017-09-27 | Discharge: 2017-09-27 | Disposition: A | Discharge status: Home / Self Care | Payer: Medicare Other | Attending: Internal Medicine | Admitting: Internal Medicine

## 2017-09-27 ENCOUNTER — Inpatient Hospital Stay (HOSPITAL_COMMUNITY): Payer: Medicare Other

## 2017-09-27 DIAGNOSIS — F061 Catatonic disorder due to known physiological condition: Secondary | ICD-10-CM

## 2017-09-27 DIAGNOSIS — Z515 Encounter for palliative care: Secondary | ICD-10-CM

## 2017-09-27 DIAGNOSIS — K567 Ileus, unspecified: Secondary | ICD-10-CM

## 2017-09-27 DIAGNOSIS — F209 Schizophrenia, unspecified: Secondary | ICD-10-CM

## 2017-09-27 LAB — CBC WITH DIFFERENTIAL/PLATELET
BASOS ABS: 0.2 10*3/uL — AB (ref 0.0–0.1)
Basophils Relative: 1 %
EOS ABS: 0.2 10*3/uL (ref 0.0–0.7)
Eosinophils Relative: 1 %
HCT: 30.2 % — ABNORMAL LOW (ref 39.0–52.0)
Hemoglobin: 10 g/dL — ABNORMAL LOW (ref 13.0–17.0)
LYMPHS ABS: 2.1 10*3/uL (ref 0.7–4.0)
LYMPHS PCT: 13 %
MCH: 30.7 pg (ref 26.0–34.0)
MCHC: 33.1 g/dL (ref 30.0–36.0)
MCV: 92.6 fL (ref 78.0–100.0)
Monocytes Absolute: 2.1 10*3/uL — ABNORMAL HIGH (ref 0.1–1.0)
Monocytes Relative: 13 %
NEUTROS ABS: 11.6 10*3/uL — AB (ref 1.7–7.7)
Neutrophils Relative %: 72 %
PLATELETS: 476 10*3/uL — AB (ref 150–400)
RBC: 3.26 MIL/uL — ABNORMAL LOW (ref 4.22–5.81)
RDW: 14.7 % (ref 11.5–15.5)
WBC: 16.2 10*3/uL — ABNORMAL HIGH (ref 4.0–10.5)

## 2017-09-27 LAB — GLUCOSE, CAPILLARY
Glucose-Capillary: 88 mg/dL (ref 65–99)
Glucose-Capillary: 108 mg/dL — ABNORMAL HIGH (ref 65–99)
Glucose-Capillary: 95 mg/dL (ref 65–99)

## 2017-09-27 LAB — MAGNESIUM: Magnesium: 2.1 mg/dL (ref 1.7–2.4)

## 2017-09-27 LAB — COMPREHENSIVE METABOLIC PANEL
ALT: 66 U/L — ABNORMAL HIGH (ref 17–63)
ANION GAP: 12 (ref 5–15)
AST: 65 U/L — ABNORMAL HIGH (ref 15–41)
Albumin: 2.9 g/dL — ABNORMAL LOW (ref 3.5–5.0)
Alkaline Phosphatase: 92 U/L (ref 38–126)
BUN: 31 mg/dL — ABNORMAL HIGH (ref 6–20)
CHLORIDE: 99 mmol/L — AB (ref 101–111)
CO2: 34 mmol/L — AB (ref 22–32)
Calcium: 8.7 mg/dL — ABNORMAL LOW (ref 8.9–10.3)
Creatinine, Ser: 1.1 mg/dL (ref 0.61–1.24)
GFR calc non Af Amer: >60 mL/min (ref >60)
Glucose, Bld: 101 mg/dL — ABNORMAL HIGH (ref 65–99)
Potassium: 3.4 mmol/L — ABNORMAL LOW (ref 3.5–5.1)
SODIUM: 145 mmol/L (ref 135–145)
Total Bilirubin: 1.3 mg/dL — ABNORMAL HIGH (ref 0.3–1.2)
Total Protein: 6.5 g/dL (ref 6.5–8.1)

## 2017-09-27 LAB — PHOSPHORUS: PHOSPHORUS: 3.8 mg/dL (ref 2.5–4.6)

## 2017-09-27 MED ORDER — LEVETIRACETAM IN NACL 1500 MG/100ML IV SOLN
1500.0000 mg | Freq: Once | INTRAVENOUS | Status: AC
  Administered 2017-09-27: 1500 mg via INTRAVENOUS
  Filled 2017-09-27: qty 100

## 2017-09-27 MED ORDER — POTASSIUM CHLORIDE 10 MEQ/100ML IV SOLN
10.0000 meq | INTRAVENOUS | Status: AC
  Administered 2017-09-27 (×3): 10 meq via INTRAVENOUS
  Filled 2017-09-27 (×3): qty 100

## 2017-09-27 NOTE — Progress Notes (Signed)
No response from Dr. Kennon Holter, this RN paged Dr. Silas Sacramento for the same request to transfer pt to [facility] via Dr. Arrie Eastern orders. Waiting for response.

## 2017-09-27 NOTE — Progress Notes (Signed)
Physical Therapy Treatment Patient Details Name: Kevin Humphrey MRN: [id number] DOB: 1950/12/15 Today's Date: 09/27/2017    History of Present Illness 67 y.o.malewithhistory of advanced dementia, schizophrenia, hypertension was brought from the skilled nursing facility after patient was found to have persistent nausea vomiting with hematemesis. Chest x-ray done at this nursing facility was showing some infiltrates and abdomen x-ray was showing concerning for bowel obstruction.    PT Comments    Total +2 for any mobility. Pt was non participatory on today. He did not respond to any verbal or tactile stimuli. He either had his eyes closed or stared off into space during session. Will follow on a limited basis.   Follow Up Recommendations  SNF     Equipment Recommendations       Recommendations for Other Services       Precautions / Restrictions Precautions Precautions: Fall Precaution Comments: abd surgery Restrictions Weight Bearing Restrictions: No    Mobility  Bed Mobility Overal bed mobility: Needs Assistance Bed Mobility: Supine to Sit;Sit to Supine     Supine to sit: Total assist;+2 for physical assistance;+2 for safety/equipment;HOB elevated Sit to supine: Total assist;+2 for physical assistance;+2 for safety/equipment;HOB elevated   General bed mobility comments: Pt did not participate at all. Partially sat pt up at EOB until pt went rigid. Immediately returned pt to supine using bedpad.   Transfers                 General transfer comment: Nt-unable  Ambulation/Gait                 Stairs             Wheelchair Mobility    Modified Rankin (Stroke Patients Only)       Balance Overall balance assessment: Needs assistance   Sitting balance-Leahy Scale: Zero                                      Cognition Arousal/Alertness: Awake/alert Behavior During Therapy: Flat affect Overall Cognitive Status: No  family/caregiver present to determine baseline cognitive functioning                                 General Comments: pt stared off into space. He did not respond at all to any questions.      Exercises      General Comments        Pertinent Vitals/Pain Pain Assessment: Faces Faces Pain Scale: No hurt    Home Living                      Prior Function            PT Goals (current goals can now be found in the care plan section) Progress towards PT goals: Not progressing toward goals - comment(pt was nonparticipatory )    Frequency    Min 2X/week      PT Plan Current plan remains appropriate    Co-evaluation              AM-PAC PT "6 Clicks" Daily Activity  Outcome Measure  Difficulty turning over in bed (including adjusting bedclothes, sheets and blankets)?: Unable Difficulty moving from lying on back to sitting on the side of the bed? : Unable Difficulty sitting down on and standing up from a chair  with arms (e.g., wheelchair, bedside commode, etc,.)?: Unable Help needed moving to and from a bed to chair (including a wheelchair)?: Total Help needed walking in hospital room?: Total Help needed climbing 3-5 steps with a railing? : Total 6 Click Score: 6    End of Session   Activity Tolerance: Patient limited by lethargy Patient left: in bed;with bed alarm set;with call bell/phone within reach   PT Visit Diagnosis: Difficulty in walking, not elsewhere classified (R26.2);Other abnormalities of gait and mobility (R26.89)     Time: 4210-3128 PT Time Calculation (min) (ACUTE ONLY): 11 min  Charges:  $Therapeutic Activity: 8-22 mins                    G Codes:          Weston Anna, MPT Pager: [phone]

## 2017-09-27 NOTE — Progress Notes (Signed)
Daily Progress Note   Patient Name: Kevin Humphrey       Date: 09/27/2017 DOB: 11/27/1950  Age: 67 y.o. MRN#: [id number] Attending Physician: Kerney Elbe, DO Primary Care Physician: Duffy Bruce, Manya Silvas, MD Admit Date: 09/19/2017  Reason for Consultation/Follow-up: Establishing goals of care  Subjective: Palliative care asked to reengage as Kevin Humphrey has been unable to progress in care due to mental status.  Surgery has been recommending progressing diet, however, he has not been able to work with speech due to the fact that he does not interact due to altered mental status.  I saw and examined Kevin Humphrey today in addition to reviewing his chart and discussing with bedside care team.  He does not respond to verbal or tactile stimulation on my examination today.  He does withdraw from pain.  Length of Stay: 7  Current Medications: Scheduled Meds:  . bisacodyl  10 mg Rectal Daily  . Chlorhexidine Gluconate Cloth  6 each Topical Q0600  . mouth rinse  15 mL Mouth Rinse BID  . metoprolol tartrate  5 mg Intravenous Q8H  . mupirocin ointment  1 application Nasal BID  . polyethylene glycol  17 g Oral BID  . sorbitol, milk of mag, mineral oil, glycerin (SMOG) enema  960 mL Rectal Once    Continuous Infusions: . chlorproMAZINE (THORAZINE) IV    . famotidine (PEPCID) IV Stopped (09/27/17 0950)  . methocarbamol (ROBAXIN)  IV Stopped (09/26/17 0030)  . piperacillin-tazobactam (ZOSYN)  IV Stopped (09/27/17 1124)  . valproate sodium 250 mg (09/27/17 1313)    PRN Meds: [DISCONTINUED] acetaminophen **OR** acetaminophen, chlorproMAZINE (THORAZINE) IV, hydrALAZINE, methocarbamol (ROBAXIN)  IV, morphine injection, ondansetron **OR** ondansetron (ZOFRAN) IV, prochlorperazine  Physical Exam           Vital Signs: BP (!) 145/64 (BP Location: Left Arm)   Pulse 87   Temp 99.4 F (37.4 C) (Axillary)   Resp (!) 23   Ht 5\' 11"  (1.803 m)   Wt 69.6 kg (153 lb 7 oz)   SpO2 96%   BMI 21.40 kg/m  SpO2: SpO2: 96 % O2 Device: O2 Device: Nasal Cannula O2 Flow Rate: O2 Flow Rate (L/min): 3 L/min  Intake/output summary:   Intake/Output Summary (Last 24 hours) at 09/27/2017 1339 Last data filed at 09/27/2017 1132 Gross per 24  hour  Intake 760 ml  Output 1335 ml  Net -575 ml   LBM: Last BM Date: 09/26/17 Baseline Weight: Weight: 72.7 kg (160 lb 4.4 oz) Most recent weight: Weight: 69.6 kg (153 lb 7 oz)       Palliative Assessment/Data:      Patient Active Problem List   Diagnosis Date Noted  . Volume overload 09/24/2017  . Hypokalemia   . Acute metabolic encephalopathy 74/11/1446  . Ileus ([place]) 09/23/2017  . Acute lower UTI 09/22/2017  . Anxiety state 09/21/2017  . Small bowel obstruction ([place])   . Hypomagnesemia   . SBO (small bowel obstruction) s/p lap adhesiolysis 09/22/2017 09/20/2017  . ARF (acute renal failure) ([place]) 09/20/2017  . Hematemesis 09/20/2017  . Sepsis ([place]) 09/20/2017  . HCAP (healthcare-associated pneumonia)   . Ventilator dependent ([place])   . Acute cystitis with hematuria   . Bacteremia   . Accelerated hypertension   . Anemia, chronic disease   . Acute kidney injury ([place])   . Hypernatremia   . Protein-calorie malnutrition ([place])   . Acute encephalopathy   . Palliative care encounter 12/17/2014  . Dysphagia 12/17/2014  . Acute respiratory failure with hypoxia ([place]) 12/14/2014  . Staphylococcal pneumonia ([place]) 12/14/2014  . Septic shock ([place]) 12/14/2014  . AKI (acute kidney injury) ([place]) 12/14/2014  . Respiratory failure ([place])   . Pneumonia 12/12/2014  . Malnutrition of moderate degree ([place]) 12/12/2014  . Pressure ulcer 12/12/2014  . Convulsions/seizures ([place]) 08/20/2013  . Other and unspecified hyperlipidemia 08/02/2012  . GERD  (gastroesophageal reflux disease) 08/02/2012  . Schizophrenia ([place]) 08/02/2012  . Depression 08/02/2012  . Allergic rhinitis 08/02/2012  . Dysphagia, unspecified(787.20) 08/02/2012  . Vertebral compression fracture ([place]) 08/02/2012    Palliative Care Assessment & Plan   Patient Profile: 67 y.o. male admitted on 09/19/2017 from [place] with persistent nausea and vomiting with hematemesis. He has a past medical history significant for advanced dementia, schizophrenia, hypertension, GERD, compression fracture (2014), dysphagia, and depression. CT abdomen pelvis showed bowel obstruction with transition point and he is s/p surgical intervention.  Mental status remains altered and he is unable to be progressed in diet due to this.   Recommendations/Plan:  - Patient has APS guardian.  Attempted to call number on chart for [facility], his APS guardian.  I left a voicemail requesting return call to discuss his care.  Await return call.  Goals of Care and Additional Recommendations:  Limitations on Scope of Treatment:  DNR/DNI - Full Scope Treatment until point of cardiac or respiratory arrest.    Code Status:    Code Status Orders  (From admission, onward)        Start     Ordered   09/20/17 0605  Do not attempt resuscitation (DNR)  Continuous    Question Answer Comment  In the event of cardiac or respiratory ARREST Do not call a "code blue"   In the event of cardiac or respiratory ARREST Do not perform Intubation, CPR, defibrillation or ACLS   In the event of cardiac or respiratory ARREST Use medication by any route, position, wound care, and other measures to relive pain and suffering. May use oxygen, suction and manual treatment of airway obstruction as needed for comfort.      09/20/17 1856    Code Status History    Date Active Date Inactive Code Status Order ID Comments User Context   12/25/2014 0921 12/26/2014 1321 DNR [id number]  Pershing Proud, NP Inpatient  12/12/2014 0514 12/25/2014 0921 Full Code [id number]  Luz Brazen, MD ED       Prognosis:   Guarded  Discharge Planning:  To Be Determined  Care plan was discussed with RN, Sr. Alfredia Ferguson  Thank you for allowing the Palliative Medicine Team to assist in the care of this patient.   Total Time 20 Prolonged Time Billed No      Greater than 50%  of this time was spent counseling and coordinating care related to the above assessment and plan.  Micheline Rough, MD  Please contact Palliative Medicine Team phone at [phone] for questions and concerns.

## 2017-09-27 NOTE — Progress Notes (Signed)
Orders to transfer pt placed by Dr. Silas [place] & acknowledged by this RN.

## 2017-09-27 NOTE — Plan of Care (Signed)
Due to AMS pt has not been able to eat or ambulate

## 2017-09-27 NOTE — Progress Notes (Addendum)
Got off the phone w/ Dr. Lorraine Lax. MD states he read EEG results and pt was having seizures in the last 73min of EEG. MD would like to transfer pt to [facility] to be on a continuous EEG monitor. MD states he was unable to get in touch with [facility] on call & requested that this RN attempt to get in touch. This RN paged Dr. Kennon Holter and is currently waiting for response. STAT order of IV Keppra will be given asap.

## 2017-09-27 NOTE — Progress Notes (Signed)
SLP Cancellation Note  Patient Details Name: Kevin Humphrey MRN: [id number] DOB: [birth year]   Cancelled treatment:       Reason Eval/Treat Not Completed: Other (comment)(pt being taken to MRI - will reattempt next date) RN reports pt without significant change in mental status compared to yesterday.   Luanna Salk, [place] [facility] SLP [place], [place] 09/27/2017, 5:04 PM

## 2017-09-27 NOTE — Progress Notes (Signed)
[facility] Progress Note  5 Days Post-Op  Subjective: CC:  Pt non-verbal this AM. Will intermittently open his eyes to loud voice or sternal rub. Not FC.   Objective: Vital signs in last 24 hours: Temp:  [97.5 F (36.4 C)-99.6 F (37.6 C)] 98.6 F (37 C) (06/06 0353) Pulse Rate:  [74-104] 74 (06/06 0620) Resp:  [16-27] 22 (06/06 0620) BP: (132-194)/(62-99) 146/62 (06/06 0620) SpO2:  [92 %-97 %] 97 % (06/06 0620) Weight:  [69.6 kg (153 lb 7 oz)] 69.6 kg (153 lb 7 oz) (06/06 0413) Last BM Date: 09/26/17  Intake/Output from previous day: 06/05 0701 - 06/06 0700 In: 860 [IV Piggyback:860] Out: 2075 [Urine:2075] Intake/Output this shift: No intake/output data recorded.  PE: Gen:  Lethargic, ill-appearing Card:  Regular rate and rhythm, pedal pulses 2+ BL Pulm:  Mildly labored respirations, clear to auscultation bilateral anterior chest wall - diminished breath sounds lung bases Abd: Soft, non-tender, non-distended, bowel sounds present, incisions C/D/I Skin: warm and dry, no rashes  Psych: A&Ox3   Lab Results:  Recent Labs    09/26/17 0259 09/27/17 0312  WBC 17.7* 16.2*  HGB 9.4* 10.0*  HCT 27.9* 30.2*  PLT 408* 476*   BMET Recent Labs    09/26/17 0259 09/27/17 0312  NA 147* 145  K 3.3* 3.4*  CL 103 99*  CO2 35* 34*  GLUCOSE 101* 101*  BUN 21* 31*  CREATININE 1.03 1.10  CALCIUM 8.6* 8.7*   PT/INR No results for input(s): LABPROT, INR in the last 72 hours. CMP     Component Value Date/Time   NA 145 09/27/2017 0312   NA 137 08/27/2015   K 3.4 (L) 09/27/2017 0312   CL 99 (L) 09/27/2017 0312   CO2 34 (H) 09/27/2017 0312   GLUCOSE 101 (H) 09/27/2017 0312   BUN 31 (H) 09/27/2017 0312   BUN 16 08/27/2015   CREATININE 1.10 09/27/2017 0312   CALCIUM 8.7 (L) 09/27/2017 0312   PROT 6.5 09/27/2017 0312   ALBUMIN 2.9 (L) 09/27/2017 0312   AST 65 (H) 09/27/2017 0312   ALT 66 (H) 09/27/2017 0312   ALKPHOS 92 09/27/2017 0312   BILITOT 1.3 (H)  09/27/2017 0312   GFRNONAA >60 09/27/2017 0312   GFRAA >60 09/27/2017 0312   Lipase     Component Value Date/Time   LIPASE 19 09/19/2017 2324       Studies/Results: Ct Head Wo Contrast  Result Date: 09/26/2017 CLINICAL DATA:  Unexplained altered level of consciousness beginning this morning. EXAM: CT HEAD WITHOUT CONTRAST TECHNIQUE: Contiguous axial images were obtained from the base of the skull through the vertex without intravenous contrast. COMPARISON:  12/12/2014 FINDINGS: Brain: Motion artifact noted. No evidence of acute infarction, hemorrhage, hydrocephalus, extra-axial collection, or mass lesion/mass effect. Mild diffuse cerebral atrophy is noted. Vascular:  No hyperdense vessel or other acute findings. Skull: No evidence of fracture or other significant bone abnormality. Sinuses/Orbits: Air-fluid level is seen within the left maxillary sinus, with diffuse thickening and sclerosis of the bony walls. This finding shows no significant change and is consistent with chronic sinusitis. Mucosal thickening is again noted within the sphenoid sinus. Other: None. IMPRESSION: No acute intracranial abnormality. Mild cerebral atrophy. Chronic sinusitis. Electronically Signed   By: Earle Gell M.D.   On: 09/26/2017 10:34    Anti-infectives: Anti-infectives (From admission, onward)   Start     Dose/Rate Route Frequency Ordered Stop   09/22/17 1000  cefoTEtan (CEFOTAN) 2 g in sodium chloride 0.9 %  100 mL IVPB     2 g 200 mL/hr over 30 Minutes Intravenous On call to O.R. 09/22/17 0852 09/22/17 1600   09/21/17 1700  vancomycin (VANCOCIN) IVPB 750 mg/150 ml premix  Status:  Discontinued     750 mg 150 mL/hr over 60 Minutes Intravenous Every 12 hours 09/21/17 0956 09/22/17 0959   09/21/17 0600  vancomycin (VANCOCIN) IVPB 1000 mg/200 mL premix  Status:  Discontinued     1,000 mg 200 mL/hr over 60 Minutes Intravenous Every 24 hours 09/20/17 0628 09/20/17 0921   09/21/17 0600  vancomycin (VANCOCIN)  IVPB 750 mg/150 ml premix  Status:  Discontinued     750 mg 150 mL/hr over 60 Minutes Intravenous Every 24 hours 09/20/17 0921 09/21/17 0956   09/20/17 0800  piperacillin-tazobactam (ZOSYN) IVPB 3.375 g     3.375 g 12.5 mL/hr over 240 Minutes Intravenous Every 8 hours 09/20/17 0612     09/20/17 0130  vancomycin (VANCOCIN) 1,500 mg in sodium chloride 0.9 % 500 mL IVPB     1,500 mg 250 mL/hr over 120 Minutes Intravenous  Once 09/20/17 0100 09/20/17 0453   09/20/17 0100  piperacillin-tazobactam (ZOSYN) IVPB 3.375 g     3.375 g 100 mL/hr over 30 Minutes Intravenous  Once 09/20/17 0047 09/20/17 0214   09/20/17 0100  vancomycin (VANCOCIN) IVPB 1000 mg/200 mL premix  Status:  Discontinued     1,000 mg 200 mL/hr over 60 Minutes Intravenous  Once 09/20/17 0047 09/20/17 0100     Assessment/Plan ARF Aspiration PNA UTI Anemia, normocytic - no overt bleeding. follow H&H, hgb 8.8 from 8.9; on iron per primary team  HTN Schizophrenia  Dementia  Malnutrition   AMS - per medical service   SBO 2/2 adehesions from previous laparotomy S/PLAPAROSCOPICLYSIS OF ADHESIONS6/1/19 [name]  - POD#5, afebrile  -NGT removed, having bowel function -  ok to start a diet once cleared by SLP, due to AMS pt has been unable to eat. -  Continue physical therapy -  IS  FEN:NPO - ok to start a diet pending SLP clearance, hypokalema (3.4)  ID: Zosyn (UTI/PNA) 5/30>>, WBC 16.2 from 17.7  VTE: SCD's Follow up: Dr. Michael Boston    LOS: 7 days    Jill Alexanders , [facility] 09/27/2017, 7:11 AM Pager: [phone] Consults: [phone] Mon-Fri 7:00 am-4:30 pm Sat-Sun 7:00 am-11:30 am

## 2017-09-27 NOTE — Procedures (Signed)
Date of recording 09/27/2017  Referring physician Dr. Cheral Marker  Reason for the study Altered mental status  Technical Digital EEG recording using 10-20 international electrode system  description of the recording Posterior dominant rhythm is 5 to 6 Hz symmetrical. Almost continuous theta slowing The last 5 minutes of recording paroxysmal fast activity which started theta and then becomes more rhythmic suggesting electrographic seizure, ictal onset difficult to localize.  Patient was described as unresponsive during this event.  Impression Last 5 minutes of recording showing electrographic seizure

## 2017-09-27 NOTE — Progress Notes (Signed)
Patient in by care link via stretcher. On assessment patient non-responsive,  Head to assessment done by this nurse and Lurline Hare. On head to toe assessment, no pressure ulcer noted, foam dressing to sacrum and mid-back for protection, patient made comfortable will continue monitor.

## 2017-09-27 NOTE — Consult Note (Signed)
NEURO HOSPITALIST CONSULT NOTE   Requestig physician: Dr. Alfredia Ferguson  Reason for Consult: AMS  History obtained from:  Patient and Chart   HPI:                                                                                                                                          Kevin Humphrey is an 67 y.o. male with past medical history of schizophrenia, dementia, confusion, hyperlipidemia that originally admitted to [facility] for a bowel obstruction and sepsis. Per chart patient was at a skilled nursing facility where he was found to have persistent nausea vomiting and hematemesis.  Chest x-ray showed possible infiltrates.  CT abdomen pelvis showed bowel obstruction with transition point.  Patient was started on empiric antibiotics for sepsis, and underwent abdominal surgery for small bowel obstructions due to adhesions on 09/22/2017.  Per RN his old hospice caregiver stated that patient has days where he sleeps all day but will usually perk back.  Patient has not yet returned to baseline and neurology was consulted for AMS.  Past Medical History:  Diagnosis Date  . Allergic rhinitis 08/02/2012  . Depression 08/02/2012  . Dysphagia, unspecified 08/02/2012  . GERD (gastroesophageal reflux disease) 08/02/2012  . Other and unspecified hyperlipidemia 08/02/2012  . Schizophrenia ([place]) 08/02/2012  . Vertebral compression fracture ([place]) 08/02/2012    Past Surgical History:  Procedure Laterality Date  . EXPLORATORY LAPAROTOMY     ??? (Has large midline incision)  . LAPAROSCOPY N/A 09/22/2017   Procedure: LAPAROSCOPY DIAGNOSTIC  LYSIS OF ADHESIONS;  Surgeon: Michael Boston, MD;  Location: [facility];  Service: General;  Laterality: N/A;    Family History  Family history unknown: Yes        Social History:  reports that he has quit smoking. He has never used smokeless tobacco. He reports that he drank alcohol. He reports that he has current or past drug history.  No Known  Allergies  MEDICATIONS:                                                                                                                     Scheduled: . bisacodyl  10 mg Rectal Daily  . Chlorhexidine Gluconate Cloth  6 each Topical Q0600  . mouth rinse  15 mL Mouth  Rinse BID  . metoprolol tartrate  5 mg Intravenous Q8H  . mupirocin ointment  1 application Nasal BID  . polyethylene glycol  17 g Oral BID  . sorbitol, milk of mag, mineral oil, glycerin (SMOG) enema  960 mL Rectal Once   Continuous: . chlorproMAZINE (THORAZINE) IV    . famotidine (PEPCID) IV Stopped (09/27/17 0950)  . methocarbamol (ROBAXIN)  IV Stopped (09/26/17 0030)  . piperacillin-tazobactam (ZOSYN)  IV Stopped (09/27/17 1124)  . valproate sodium 250 mg (09/27/17 1313)   PRN:[DISCONTINUED] acetaminophen **OR** acetaminophen, chlorproMAZINE (THORAZINE) IV, hydrALAZINE, methocarbamol (ROBAXIN)  IV, morphine injection, ondansetron **OR** ondansetron (ZOFRAN) IV, prochlorperazine   ROS:                                                                                                                                       History obtained from chart review d/t AMS  General ROS: negative for - chills, fatigue, fever, night sweats, weight gain or weight loss Psychological ROS: negative for - behavioral disorder, hallucinations, memory difficulties, mood swings or suicidal ideation Ophthalmic ROS: negative for - blurry vision, double vision, eye pain or loss of vision Endocrine ROS: negative for - galactorrhea, hair pattern changes, polydipsia/polyuria or temperature intolerance Respiratory ROS: negative for - cough, hemoptysis, shortness of breath or wheezing Cardiovascular ROS: negative for - chest pain, dyspnea on exertion, edema or irregular heartbeat Gastrointestinal ROS: s/p laparoscopic lysis of adhesions Genito-Urinary ROS: Foley in place  Neurological ROS: as noted in HPI Dermatological positive for surgical  incisions, left knee pressure ulcer   Blood pressure (!) 145/64, pulse 87, temperature 99.4 F (37.4 C), temperature source Axillary, resp. rate (!) 23, height 5\' 11"  (1.803 m), weight 69.6 kg (153 lb 7 oz), SpO2 96 %.   General Examination:                                                                                                       Physical Exam  HEENT-  Normocephalic, no lesions, without obvious abnormality.  Normal external eye and conjunctiva.  Patient noted to have a left facial droop, but patient is partially edentulous. Cardiovascular- S1-S2 audible, +2 radial and pedal pulses Lungs-no rhonchi or wheezing noted, no excessive working breathing on 3 L [place] Saturations within normal limits Extremities- Warm, dry, left knee with pressure dressing. Pitting edema in bilateral hands Musculoskeletal-no joint tenderness, deformity or swelling Skin-warm and dry, no hyperpigmentation, vitiligo, or suspicious lesions  Neurological Examination Mental Status:  Patient  in bed, initially asleep, no acute distress, no sedation noted on 3 L nasal cannula with sats at 98%.  Patient is very drowsy and difficult to arouse on initial exam by NP. Will respond to sternal rub but does not follow any commands. Left hand appears to have a slight "pill-rolling" tremor that does not stop when arm is lifted.  Cranial Nerves: Ptosis not present, blinks to threat, PERRL. Face asymmetric, mild left facial droop noted but patient edentulous. Motor/ Sensory: Patient able to move arms spontaneously, but will not move them on command.  Patient only moved arms during a sternal rub, other than the pill-rolling noted above.  Tone and bulk: Waxy rigidity x 4. Moves bilateral lower extremities to noxious stimuli, slight grimace to bilateral upper extremity noxious stimuli, but no localization. Intermittent twitching of left ankle.  Deep Tendon Reflexes: 2+ in all extremities except left knee which has a pressure ulcer  dressing covering the knee. Plantars: Right: downgoing   Left: downgoing Cerebellar: Unable to assess Gait: Unable to assess  On follow up attending exam the patient is arousable to an apparent fully awake state with eyes open and a blank conjugate stare at the midline, with a severely reduced blink rate. Face is symmetric with hypomimia noted. He does not respond to any commands and will not gaze towards or away from visual stimuli. He exhibits waxy rigidity in upper and lower extremities diffusely. Also with axial rigidity including the neck but does nt appear consistent with meningismus. 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    Lab Results: Basic Metabolic Panel: Recent Labs  Lab 09/23/17 0343 09/24/17 0513 09/25/17 0758 09/26/17 0259 09/27/17 0312  NA 143 143 143 147* 145  K 4.3 3.8 3.7 3.3* 3.4*  CL 114* 112* 105 103 99*  CO2 21* 21* 25 35* 34*  GLUCOSE 102* 88 97 101* 101*  BUN 20 19 19  21* 31*  CREATININE 0.85 0.83 0.93 1.03 1.10  CALCIUM 8.0* 8.2* 8.4* 8.6* 8.7*  MG 1.9 1.7 2.1 2.3 2.1  PHOS  --   --   --   --  3.8    CBC: Recent Labs  Lab 09/21/17 0521 09/22/17 0743 09/23/17 0343 09/24/17 0513 09/25/17 0758 09/26/17 0259 09/27/17 0312  WBC 12.0* 11.3* 12.5* 14.2* 18.1* 17.7* 16.2*  NEUTROABS 9.6* 9.2* 11.1*  --   --   --  11.6*  HGB 9.8* 8.3* 8.9* 8.8* 9.8* 9.4* 10.0*  HCT 29.3* 25.1* 25.2* 26.6* 29.5* 27.9* 30.2*  MCV 92.7 94.0 92.3 93.7 92.5 91.2 92.6  PLT 241 207 260 245 402* 408* 476*    Cardiac Enzymes: Recent Labs  Lab 09/20/17 1843  TROPONINI <0.03    Lipid Panel: No results for input(s): CHOL, TRIG, HDL, CHOLHDL, VLDL, LDLCALC in the last 168 hours.  Imaging: Ct Head WO contrast: No acute intracranial abnormality. Mild cerebral atrophy. Chronic sinusitis.  History and exam documented by Laurey Morale, MSN, NP-C, [facility] [phone]   Impression: 67 y.o. male with past medical history of schizophrenia, dementia, confusion, hyperlipidemia that was originally admitted to [facility] for a bowel obstruction and sepsis.  Neurology was consulted for AMS after patient has not returned to baseline since surgery on 09/22/2017.  Toxic metabolic encephalopathy vs seizures versus catatonic state. Of note, he has had prior episodes of depressed consciousness per the history; his schizophrenia increases the risk of episode of catatonia. Neuroleptic malignant syndrome would  be a consideration with rigidity and increased white count but he has not been receiving PRN thorazine recently and he has been afebrile for the last several days. Seizure possible given intermittent left ankle twitching, but no other adventitious movements to suggest such. Meningitis unlikely given lack of fever.   Recommendations: 1) MRI 2) EEG 3) Perform a lorazepam challenge test. Per the literature, "Benzodiazepines are the mainstay of the treatment of catatonia and are also helpful as a diagnostic probe. A positive Lorazepam Challenge Test validates the diagnosis of catatonia. After the patient is examined for signs of catatonia, 1 or 2?mg of lorazepam is administered intravenously. After 5?minutes, the patient is re-examined. If there has been no change, a second dose is given, and the patient is again reassessed (46, 78). A positive response is a marked reduction (e.g., at least 50%) of catatonic signs and symptoms, as measured with a standardized rating scale. Favorable responses usually occur within 10?min." 4) If lorazepam challenge test is positive, consider holding valproic acid as this medication can be associated in some cases with development of catatonia. In that event, would need to cover the patient with low-dose Keppra to prevent withdrawal seizure. Call Neurology if lorazepam challenge test is positive, to discuss further.   I have  seen and examined the patient. I have amended the assessment and recommendations above.  Electronically signed: Dr. Kerney Elbe 09/27/2017, 2:21 PM

## 2017-09-27 NOTE — Progress Notes (Signed)
PROGRESS NOTE    Kevin Humphrey  XHB:[id number] DOB: Dec 11, 1950 DOA: 09/19/2017 PCP: [name], MD   Brief Narrative:  Kevin Jonesis a 67 y.o.malewithhistory of advanced dementia, schizophrenia, hypertension was brought from the skilled nursing facility after patient was found to have persistent nausea vomiting with hematemesis. Chest x-ray done at this nursing facility was showing some infiltrates and abdomen x-ray was showing concerning for bowel obstruction.  In the ER stool for occult blood was positive. Abdomen was distended and initially patient on arrival was confused and minimally responsive but somewhat improved after starting antibiotics and fluids. CT abdomen pelvis shows bowel obstruction with transition point. Chest x-ray was showing possibility of infiltrates. Patient was hypotensive improved with fluids and empiric antibiotic started for sepsis likely from aspiration and found to have a UTI as well.   Underwent Laparoscopic Lysis of Adhesions for SBO and improved but continued to be Encephalopathic and not following commands. Because of his AMS an MRI and EEG were ordered and Neurology was consulted to evaluate and make recommendations.   Assessment & Plan:   Principal Problem:   SBO (small bowel obstruction) s/p lap adhesiolysis 09/22/2017 Active Problems:   Schizophrenia ([place])   Malnutrition of moderate degree (HCC)   Acute respiratory failure with hypoxia (HCC)   ARF (acute renal failure) (HCC)   Hematemesis   Sepsis (HCC)   Anxiety state   Small bowel obstruction (HCC)   Hypomagnesemia   Acute lower UTI   Acute metabolic encephalopathy   Ileus (HCC)   Volume overload   Hypokalemia  High-grade small bowel obstruction that is post laparoscopic lysis of adhesions 09/22/2017 with subsequent Ileus -With small bowel obstruction secondary to adhesions.   -Patient status post laparoscopic lysis of adhesions 09/22/2017 prior [name].   -Patient had  ileus with no bowel movements and no flatus and hypoactive bowel sounds as of 09/23/2017.   -Patient noted to have some harder balls of stool a few days ago but now having looser stools and given SMOG a few days ago -NG tube has been removed per general surgery and patient is to be started on some clears today pending SLP Clearance. SLP unable to evaluate again today due to Lethargy and AMS; Because Patient is not eating Surgery recommending possible TNA/TPN in the next few days  -Keep magnesium greater than 2.  Keep potassium greater than 4.   -Saline lock IV fluids secondary to volume overload.   -Supportive care. Pain Control Per General Surgery. -C/w Robaxin 1000 mg po q6hprn Muscle spasms   Acute respiratory failure with hypoxia secondary to volume overload -Patient noted to have worsening shortness of breath the night of 09/23/2017 to the morning of 09/24/2017, with increased O2 requirements requiring 8 L of high flow nasal cannula.   -Chest x-ray obtained consistent with volume overload.   -Urine output not accurately recorded.  -Weight is down to 153 -Patient is -620 mL since Admission   -Increased Lasix to 60 mg IV every 8 hours a few days ago but now has stopped IV Diuresis  -Continue strict I's and O's.  Daily weights.    Acute Metabolic Encephalopathy in the setting of Advanced Dementia -Was suspected to Likely drug-induced secondary to 2 mg of iv Dilaudid given 09/23/2017.   -Patient was alert and following commands and likely close to baseline 6/4 but not yesterday or today; ? Related to IV Morphine? -Obtain Head CT w/o Contrast: Showed No acute intracranial abnormality. Mild cerebral atrophy. Chronic sinusitis. -  Palliative Care Consulted for further evaluation and for [place]; [name] attempting to contact [facility] to further discuss patient's care -Will obtain MRI of Brain w/o Contrast, EEG, and Formal Neurology Consultation -Follow Neurology Recommendations   Acute  Renal Failure -Likely secondary to prerenal azotemia in the setting of ACE inhibitor.   -Patient noted on admission to be hypotensive.   -Patient hydrated aggressively with IV fluids.  Renal function improved and currently at baseline. BUN/Cr is 31/1.10 -Patient now noted to be in volume overload.  Patient was IV Lasix and now D/C'd.   -Monitor renal function closely with diuresis.   -Continue to hold ACE inhibitor and avoid nephrotoxins.  -Repeat CMP in AM   Sepsis likely secondary to aspiration pneumonia and Diphtheroids UTI -Per CT abdomen and pelvis as well as chest x-ray concerning for probable pneumonia.   -Patient on admission noted to be septic with hypotension, acute renal failure, tachycardia, elevated lactic acid level, elevated procalcitonin level.   -Lactic acid level and pro calcitonin levels trending down.   -Urine cultures with > 100000 colonies of Diphtheroids.   -Blood cultures pending with no growth to date.  IV vancomycin has been discontinued.   -Continue IV Zosyn D7/7.   -WBC trending down the last few days and went from 18.1 -> 16.2 -Continue to Monitor for S/Sx of Infection and Follow -Repeat CMP in AM   Anemia/Hematemesis -Likely dilutional in nature.   -Patient on admission was noted to have some hematemesis however no hematemesis noted during the hospitalization.   -Patient with no overt bleeding.   -Anemia panel has been checked and iron level at 11.  Ferritin at 142.  Vitamin B12 levels at 282.   -Continue vitamin B12 IM injections.  -Hb/Hct now stable at 10.0/30.2 -Patient s/p IV Feraheme.  -Follow H&H, transfusion threshold hemoglobin less than 8.  -Will likely need oral iron supplementation on discharge.   -Continue PPI.  -Continue to Monitor for S/Sx of Bleeding -Repeat CBC in AM   Hypertension -On admission patient noted to be septic with hypotension.   -Blood pressure improved.  BP elevated.   -Antihypertensive medications on hold secondary  to problem #1.   -Continue IV Lopressor and IV Hydralazine.   -Continued IV Lasix for volume overload and now stopped.   Schizophrenia  -Continue IV Depakote 250 mg q8h.   -IV Haldol as needed. -Delirium Precautions -May need Psychiatric Evaluation  Hypomagnesemia -Improved and is now 2.1 -Continue to Monitor and Replete as Necessary' -Repeat Mag Level in AM   Hypernatremia -Mild at 147 and improved to 145 -? From Overdiuresis -Will stop IV Lasix now  -Repeat CMP in AM   Hypokalemia -Patient's potassium this morning was 3.4  -Replete with IV potassium chloride 30 mEQ this AM -Continue to monitor and replete as necessary -Repeat CMP in a.m.  Abnormal LFTs -Patient's AST was 5 and ALT was 66 -Possibly in the setting of surgery -Obtain right upper quadrant ultrasound and acute hepatitis panel -Continue to monitor and trend LFTs and repeat CMP in a.m.  Hyperbilirubinemia -Mild at 1.3 -Continue to monitor and repeat CMP in the a.m.  Thrombocytosis -Likely reactive in the setting of aspiration infection along with UTI -Patient's platelet count went from 402 is now 476 -Continue to Monitor and repeat CBC in AM   DVT prophylaxis: SCDs Code Status: FULL CODE Family Communication: No family present at bedside  Disposition Plan: Remain Inpatient for Further workup and evaluation.  Consultants:   General  Surgery  Palliative Care   Neurology Dr. Cheral Marker   Procedures: S/p Lysis of Adhesions    Antimicrobials:  Anti-infectives (From admission, onward)   Start     Dose/Rate Route Frequency Ordered Stop   09/22/17 1000  cefoTEtan (CEFOTAN) 2 g in sodium chloride 0.9 % 100 mL IVPB     2 g 200 mL/hr over 30 Minutes Intravenous On call to O.R. 09/22/17 0852 09/22/17 1600   09/21/17 1700  vancomycin (VANCOCIN) IVPB 750 mg/150 ml premix  Status:  Discontinued     750 mg 150 mL/hr over 60 Minutes Intravenous Every 12 hours 09/21/17 0956 09/22/17 0959   09/21/17 0600   vancomycin (VANCOCIN) IVPB 1000 mg/200 mL premix  Status:  Discontinued     1,000 mg 200 mL/hr over 60 Minutes Intravenous Every 24 hours 09/20/17 0628 09/20/17 0921   09/21/17 0600  vancomycin (VANCOCIN) IVPB 750 mg/150 ml premix  Status:  Discontinued     750 mg 150 mL/hr over 60 Minutes Intravenous Every 24 hours 09/20/17 0921 09/21/17 0956   09/20/17 0800  piperacillin-tazobactam (ZOSYN) IVPB 3.375 g     3.375 g 12.5 mL/hr over 240 Minutes Intravenous Every 8 hours 09/20/17 0612     09/20/17 0130  vancomycin (VANCOCIN) 1,500 mg in sodium chloride 0.9 % 500 mL IVPB     1,500 mg 250 mL/hr over 120 Minutes Intravenous  Once 09/20/17 0100 09/20/17 0453   09/20/17 0100  piperacillin-tazobactam (ZOSYN) IVPB 3.375 g     3.375 g 100 mL/hr over 30 Minutes Intravenous  Once 09/20/17 0047 09/20/17 0214   09/20/17 0100  vancomycin (VANCOCIN) IVPB 1000 mg/200 mL premix  Status:  Discontinued     1,000 mg 200 mL/hr over 60 Minutes Intravenous  Once 09/20/17 0047 09/20/17 0100     Subjective: Seen and Examined at bedside and remains encephalopathy with altered mental status. Only withdraws to pain but does not respond to verbal commands or physical stimuli.   Objective: Vitals:   09/27/17 0844 09/27/17 0847 09/27/17 1156 09/27/17 1200  BP: (!) 164/69  (!) 170/72 (!) 145/64  Pulse: 98 96 92 87  Resp: (!) 24 (!) 24 19 (!) 23  Temp:   99.4 F (37.4 C)   TempSrc:   Axillary   SpO2: 96% 95% 99% 96%  Weight:      Height:        Intake/Output Summary (Last 24 hours) at 09/27/2017 1419 Last data filed at 09/27/2017 1132 Gross per 24 hour  Intake 760 ml  Output 1335 ml  Net -575 ml   Filed Weights   09/25/17 0229 09/26/17 0201 09/27/17 0413  Weight: 75.2 kg (165 lb 12.6 oz) 70.5 kg (155 lb 6.8 oz) 69.6 kg (153 lb 7 oz)   Examination: Physical Exam:  Constitutional: Thin altered demented Caucasian male who is currently in no acute distress is unable to participate in exam again and provide  a subjective history Eyes: Sclera anicteric.  Lids and conjunctive are normal.  Opens his eyes spontaneously but does not open him to verbal commands. ENMT: External ears and nose appear normal. Neck: Appears supple with no appreciable JVD. Respiratory: Diminished to auscultation bilaterally with no appreciable wheezing, rales, rhonchi.  Slightly increased respiratory effort however patient is in no acute distress and not using accessory muscles to breathe.  Wearing supplemental O2 via [place].  Cardiovascular: Regular rate and rhythm ith no appreciable murmurs, rubs, gallops. Abdomen: Soft, nontender, nondistended.  No appreciable masses palpated and bowel  sounds positive.  Abdominal incisions are covered GU: Has a Foley catheter in place currently. Musculoskeletal: No clubbing or cyanosis noted.  No joint deformities Skin: No rashes or lesions on limited skin evaluation Neurologic: Does not follow commands and does not respond to verbal or physical stimuli.  Only withdraws to painful stimuli Psychiatric: Judgment insight remains impaired.  Patient is not alert and he is not oriented x3.  Data Reviewed: I have personally reviewed following labs and imaging studies  CBC: Recent Labs  Lab 09/21/17 0521 09/22/17 0743 09/23/17 0343 09/24/17 0513 09/25/17 0758 09/26/17 0259 09/27/17 0312  WBC 12.0* 11.3* 12.5* 14.2* 18.1* 17.7* 16.2*  NEUTROABS 9.6* 9.2* 11.1*  --   --   --  11.6*  HGB 9.8* 8.3* 8.9* 8.8* 9.8* 9.4* 10.0*  HCT 29.3* 25.1* 25.2* 26.6* 29.5* 27.9* 30.2*  MCV 92.7 94.0 92.3 93.7 92.5 91.2 92.6  PLT 241 207 260 245 402* 408* 449*   Basic Metabolic Panel: Recent Labs  Lab 09/23/17 0343 09/24/17 0513 09/25/17 0758 09/26/17 0259 09/27/17 0312  NA 143 143 143 147* 145  K 4.3 3.8 3.7 3.3* 3.4*  CL 114* 112* 105 103 99*  CO2 21* 21* 25 35* 34*  GLUCOSE 102* 88 97 101* 101*  BUN 20 19 19  21* 31*  CREATININE 0.85 0.83 0.93 1.03 1.10  CALCIUM 8.0* 8.2* 8.4* 8.6* 8.7*  MG 1.9  1.7 2.1 2.3 2.1  PHOS  --   --   --   --  3.8   GFR: Estimated Creatinine Clearance: 63.3 mL/min (by C-G formula based on SCr of 1.1 mg/dL). Liver Function Tests: Recent Labs  Lab 09/27/17 0312  AST 65*  ALT 66*  ALKPHOS 92  BILITOT 1.3*  PROT 6.5  ALBUMIN 2.9*   No results for input(s): LIPASE, AMYLASE in the last 168 hours. No results for input(s): AMMONIA in the last 168 hours. Coagulation Profile: No results for input(s): INR, PROTIME in the last 168 hours. Cardiac Enzymes: Recent Labs  Lab 09/20/17 1843  TROPONINI <0.03   BNP (last 3 results) No results for input(s): PROBNP in the last 8760 hours. HbA1C: No results for input(s): HGBA1C in the last 72 hours. CBG: Recent Labs  Lab 09/26/17 0541 09/26/17 1154 09/26/17 2305 09/27/17 0537 09/27/17 1149  GLUCAP 98 99 98 88 108*   Lipid Profile: No results for input(s): CHOL, HDL, LDLCALC, TRIG, CHOLHDL, LDLDIRECT in the last 72 hours. Thyroid Function Tests: No results for input(s): TSH, T4TOTAL, FREET4, T3FREE, THYROIDAB in the last 72 hours. Anemia Panel: No results for input(s): VITAMINB12, FOLATE, FERRITIN, TIBC, IRON, RETICCTPCT in the last 72 hours. Sepsis Labs: Recent Labs  Lab 09/21/17 0521 09/22/17 0743  PROCALCITON 3.06 1.06  LATICACIDVEN 1.0  --     Recent Results (from the past 240 hour(s))  Blood Culture (routine x 2)     Status: None   Collection Time: 09/19/17 11:15 PM  Result Value Ref Range Status   Specimen Description   Final    BLOOD LEFT ARM UPPER Performed at [facility] [address], [place], [place] [address]    Special Requests   Final    BOTTLES DRAWN AEROBIC AND ANAEROBIC Blood Culture adequate volume Performed at [facility] [address], [place] [address]    Culture   Final    NO GROWTH 5 DAYS Performed at [facility], [place] [address] [place], [place] [address]    Report Status  09/25/2017 FINAL  Final    Urine culture     Status: Abnormal   Collection Time: 09/20/17  1:28 AM  Result Value Ref Range Status   Specimen Description   Final    URINE, CATHETERIZED Performed at [facility], [place] [address], [place], [place] [address]    Special Requests   Final    NONE Performed at [facility], [place] [address], [place], [place] [address]    Culture (A)  Final    >=100,000 COLONIES/mL DIPHTHEROIDS(CORYNEBACTERIUM SPECIES)   Report Status 09/21/2017 FINAL  Final  MRSA PCR Screening     Status: Abnormal   Collection Time: 09/20/17 12:37 PM  Result Value Ref Range Status   MRSA by PCR POSITIVE (A) NEGATIVE Final    Comment:        The GeneXpert MRSA Assay (FDA approved for NASAL specimens only), is one component of a comprehensive MRSA colonization surveillance program. It is not intended to diagnose MRSA infection nor to guide or monitor treatment for MRSA infections. RESULT CALLED TO, READ BACK BY AND VERIFIED WITH: Alcide Clever [id number] @ [id number] BY J SCOTTON Performed at [facility] [address], [place], [place] [address]     Radiology Studies: Ct Head Wo Contrast  Result Date: 09/26/2017 CLINICAL DATA:  Unexplained altered level of consciousness beginning this morning. EXAM: CT HEAD WITHOUT CONTRAST TECHNIQUE: Contiguous axial images were obtained from the base of the skull through the vertex without intravenous contrast. COMPARISON:  12/12/2014 FINDINGS: Brain: Motion artifact noted. No evidence of acute infarction, hemorrhage, hydrocephalus, extra-axial collection, or mass lesion/mass effect. Mild diffuse cerebral atrophy is noted. Vascular:  No hyperdense vessel or other acute findings. Skull: No evidence of fracture or other significant bone abnormality. Sinuses/Orbits: Air-fluid level is seen within the left maxillary sinus, with diffuse thickening and sclerosis of the bony walls. This finding shows no significant  change and is consistent with chronic sinusitis. Mucosal thickening is again noted within the sphenoid sinus. Other: None. IMPRESSION: No acute intracranial abnormality. Mild cerebral atrophy. Chronic sinusitis. Electronically Signed   By: Earle Gell M.D.   On: 09/26/2017 10:34   Scheduled Meds: . bisacodyl  10 mg Rectal Daily  . Chlorhexidine Gluconate Cloth  6 each Topical Q0600  . mouth rinse  15 mL Mouth Rinse BID  . metoprolol tartrate  5 mg Intravenous Q8H  . mupirocin ointment  1 application Nasal BID  . polyethylene glycol  17 g Oral BID  . sorbitol, milk of mag, mineral oil, glycerin (SMOG) enema  960 mL Rectal Once   Continuous Infusions: . chlorproMAZINE (THORAZINE) IV    . famotidine (PEPCID) IV Stopped (09/27/17 0950)  . methocarbamol (ROBAXIN)  IV Stopped (09/26/17 0030)  . piperacillin-tazobactam (ZOSYN)  IV Stopped (09/27/17 1124)  . valproate sodium 250 mg (09/27/17 1313)    LOS: 7 days   Kerney Elbe, DO [facility] Pager [phone]  If 7PM-7AM, please contact night-coverage [email or web address] Password [email or web address] 09/27/2017, 2:19 PM

## 2017-09-27 NOTE — Progress Notes (Signed)
Offsite EEG completed at [facility]. Results pending. 

## 2017-09-28 ENCOUNTER — Inpatient Hospital Stay (HOSPITAL_COMMUNITY): Payer: Medicare Other

## 2017-09-28 DIAGNOSIS — K56609 Unspecified intestinal obstruction, unspecified as to partial versus complete obstruction: Secondary | ICD-10-CM

## 2017-09-28 DIAGNOSIS — R569 Unspecified convulsions: Secondary | ICD-10-CM

## 2017-09-28 DIAGNOSIS — F411 Generalized anxiety disorder: Secondary | ICD-10-CM

## 2017-09-28 DIAGNOSIS — J9601 Acute respiratory failure with hypoxia: Secondary | ICD-10-CM

## 2017-09-28 DIAGNOSIS — Z7189 Other specified counseling: Secondary | ICD-10-CM

## 2017-09-28 LAB — VALPROIC ACID LEVEL: Valproic Acid Lvl: 35 ug/mL — ABNORMAL LOW (ref 50.0–100.0)

## 2017-09-28 LAB — GLUCOSE, CAPILLARY
GLUCOSE-CAPILLARY: 126 mg/dL — AB (ref 65–99)
GLUCOSE-CAPILLARY: 85 mg/dL (ref 65–99)
GLUCOSE-CAPILLARY: 98 mg/dL (ref 65–99)
Glucose-Capillary: 141 mg/dL — ABNORMAL HIGH (ref 65–99)

## 2017-09-28 LAB — AMMONIA: Ammonia: 68 umol/L — ABNORMAL HIGH (ref 9–35)

## 2017-09-28 MED ORDER — LEVETIRACETAM IN NACL 1500 MG/100ML IV SOLN
1500.0000 mg | Freq: Two times a day (BID) | INTRAVENOUS | Status: DC
  Administered 2017-09-28 – 2017-10-10 (×24): 1500 mg via INTRAVENOUS
  Filled 2017-09-28 (×26): qty 100

## 2017-09-28 MED ORDER — SODIUM CHLORIDE 0.9 % IV SOLN
750.0000 mg | Freq: Two times a day (BID) | INTRAVENOUS | Status: DC
  Filled 2017-09-28: qty 7.5

## 2017-09-28 MED ORDER — DEXTROSE 5 % IV SOLN
750.0000 mg | Freq: Once | INTRAVENOUS | Status: AC
  Administered 2017-09-28: 750 mg via INTRAVENOUS
  Filled 2017-09-28: qty 7.5

## 2017-09-28 MED ORDER — VALPROATE SODIUM 500 MG/5ML IV SOLN
500.0000 mg | Freq: Three times a day (TID) | INTRAVENOUS | Status: DC
  Administered 2017-09-28 – 2017-09-30 (×6): 500 mg via INTRAVENOUS
  Filled 2017-09-28 (×7): qty 5

## 2017-09-28 MED ORDER — JEVITY 1.2 CAL PO LIQD: 1000.0000 mL | ORAL | Status: DC

## 2017-09-28 MED ORDER — PRO-STAT SUGAR FREE PO LIQD
30.0000 mL | Freq: Two times a day (BID) | ORAL | Status: DC
  Administered 2017-09-28 – 2017-10-10 (×21): 30 mL
  Filled 2017-09-28 (×22): qty 30

## 2017-09-28 MED ORDER — OSMOLITE 1.5 CAL PO LIQD
1000.0000 mL | ORAL | Status: DC
  Administered 2017-09-28 – 2017-10-05 (×6): 1000 mL
  Filled 2017-09-28 (×15): qty 1000

## 2017-09-28 NOTE — Progress Notes (Signed)
LTM EEG discussed with Dr. Kathrin Penner. The continuous rhythmic delta activity which I saw on bedside EEG review this morning consistently increases with stimuli, suggestive of SIRPIDs (Stimulus-Induced Rhythmic, Periodic or Ictal Discharges). There are no definite electrographic seizures. Dr. Dimitriu also reviewed the EEG from earlier this morning, when Dr. Lorraine Lax noted 3-4 runs of rhythmic paroxysmal fast activity; Dr. Dimitriu's interpretation is that these runs were not electrographic seizures, but SIRPIDs and that the activity was slow at 4-5 Hz, rather than fast. Overall the findings are most consistent with a severe diffuse encephalopathy. There does not appear to be a clear indication for intubation with burst suppression under sedation.   A/R 1. Continue EEG monitoring for an additional 24 hours per Dr. Dimitriu's recommendation.  2. Continue Keppra and valproic acid at current doses. 3. No clear indication for initiation of burst suppression protocol. Of note, the patient is DNR.    Electronically signed: Dr. Kerney Elbe

## 2017-09-28 NOTE — Progress Notes (Signed)
[facility]/Trauma Progress Note  6 Days Post-Op   Assessment/Plan ARF Aspiration PNA UTI Anemia, normocytic - no overt bleeding. follow H&H, hgb 8.8 from 8.9; on iron per primary team  HTN Schizophrenia  Dementia  Malnutrition   AMS - per medical service   SBO 2/2 adehesions from previous laparotomy S/PLAPAROSCOPICLYSIS OF ADHESIONS6/1/19 [name]  -NGT removed, having bowel function - ok to start a diet once cleared by SLP, due to AMS pt has been unable to eat.  FEN:NPO - ok to start a diet pending SLP clearance. If pt is not cleared by speech he will need a cortrak or TPN for nutrition.  ID: Zosyn (UTI/PNA) 5/30>>, WBC 16.2 from 17.7  VTE: SCD's Foley: yes Follow up: Dr. Michael Boston  Plan: having bowel function. Consider cortrak is setting of prolonged NPO.   LOS: 8 days    Subjective: CC: AMS  Pt will not awake to sternal rub. He is on continuous EEG monitoring.   Objective: Vital signs in last 24 hours: Temp:  [97.9 F (36.6 C)-99.4 F (37.4 C)] 97.9 F (36.6 C) (06/07 0448) Pulse Rate:  [66-92] 75 (06/07 0448) Resp:  [19-25] 20 (06/07 0448) BP: (135-178)/(55-74) 150/74 (06/07 0448) SpO2:  [93 %-99 %] 93 % (06/07 0448) Last BM Date: 09/26/17  Intake/Output from previous day: 06/06 0701 - 06/07 0700 In: 652.5 [IV Piggyback:652.5] Out: 64 [Urine:460] Intake/Output this shift: Total I/O In: 205 [IV Piggyback:205] Out: -   PE: Gen:  Lethargic, not responsive, thin and ill appearing Card:  RRR Pulm:  Rate and effort normal Abd: Soft, not distended, +BS, incisions with glue intact are without signs of infection, pt did not grimace or response with palpation of abdomen. Skin: no rashes noted, warm and dry   Anti-infectives: Anti-infectives (From admission, onward)   Start     Dose/Rate Route Frequency Ordered Stop   09/22/17 1000  cefoTEtan (CEFOTAN) 2 g in sodium chloride 0.9 % 100 mL IVPB     2 g 200 mL/hr over 30  Minutes Intravenous On call to O.R. 09/22/17 0852 09/22/17 1600   09/21/17 1700  vancomycin (VANCOCIN) IVPB 750 mg/150 ml premix  Status:  Discontinued     750 mg 150 mL/hr over 60 Minutes Intravenous Every 12 hours 09/21/17 0956 09/22/17 0959   09/21/17 0600  vancomycin (VANCOCIN) IVPB 1000 mg/200 mL premix  Status:  Discontinued     1,000 mg 200 mL/hr over 60 Minutes Intravenous Every 24 hours 09/20/17 0628 09/20/17 0921   09/21/17 0600  vancomycin (VANCOCIN) IVPB 750 mg/150 ml premix  Status:  Discontinued     750 mg 150 mL/hr over 60 Minutes Intravenous Every 24 hours 09/20/17 0921 09/21/17 0956   09/20/17 0800  piperacillin-tazobactam (ZOSYN) IVPB 3.375 g     3.375 g 12.5 mL/hr over 240 Minutes Intravenous Every 8 hours 09/20/17 0612     09/20/17 0130  vancomycin (VANCOCIN) 1,500 mg in sodium chloride 0.9 % 500 mL IVPB     1,500 mg 250 mL/hr over 120 Minutes Intravenous  Once 09/20/17 0100 09/20/17 0453   09/20/17 0100  piperacillin-tazobactam (ZOSYN) IVPB 3.375 g     3.375 g 100 mL/hr over 30 Minutes Intravenous  Once 09/20/17 0047 09/20/17 0214   09/20/17 0100  vancomycin (VANCOCIN) IVPB 1000 mg/200 mL premix  Status:  Discontinued     1,000 mg 200 mL/hr over 60 Minutes Intravenous  Once 09/20/17 0047 09/20/17 0100      Lab Results:  Recent  Labs    09/26/17 0259 09/27/17 0312  WBC 17.7* 16.2*  HGB 9.4* 10.0*  HCT 27.9* 30.2*  PLT 408* 476*   BMET Recent Labs    09/26/17 0259 09/27/17 0312  NA 147* 145  K 3.3* 3.4*  CL 103 99*  CO2 35* 34*  GLUCOSE 101* 101*  BUN 21* 31*  CREATININE 1.03 1.10  CALCIUM 8.6* 8.7*   PT/INR No results for input(s): LABPROT, INR in the last 72 hours. CMP     Component Value Date/Time   NA 145 09/27/2017 0312   NA 137 08/27/2015   K 3.4 (L) 09/27/2017 0312   CL 99 (L) 09/27/2017 0312   CO2 34 (H) 09/27/2017 0312   GLUCOSE 101 (H) 09/27/2017 0312   BUN 31 (H) 09/27/2017 0312   BUN 16 08/27/2015   CREATININE 1.10 09/27/2017  0312   CALCIUM 8.7 (L) 09/27/2017 0312   PROT 6.5 09/27/2017 0312   ALBUMIN 2.9 (L) 09/27/2017 0312   AST 65 (H) 09/27/2017 0312   ALT 66 (H) 09/27/2017 0312   ALKPHOS 92 09/27/2017 0312   BILITOT 1.3 (H) 09/27/2017 0312   GFRNONAA >60 09/27/2017 0312   GFRAA >60 09/27/2017 0312   Lipase     Component Value Date/Time   LIPASE 19 09/19/2017 2324    Studies/Results: Ct Head Wo Contrast  Result Date: 09/26/2017 CLINICAL DATA:  Unexplained altered level of consciousness beginning this morning. EXAM: CT HEAD WITHOUT CONTRAST TECHNIQUE: Contiguous axial images were obtained from the base of the skull through the vertex without intravenous contrast. COMPARISON:  12/12/2014 FINDINGS: Brain: Motion artifact noted. No evidence of acute infarction, hemorrhage, hydrocephalus, extra-axial collection, or mass lesion/mass effect. Mild diffuse cerebral atrophy is noted. Vascular:  No hyperdense vessel or other acute findings. Skull: No evidence of fracture or other significant bone abnormality. Sinuses/Orbits: Air-fluid level is seen within the left maxillary sinus, with diffuse thickening and sclerosis of the bony walls. This finding shows no significant change and is consistent with chronic sinusitis. Mucosal thickening is again noted within the sphenoid sinus. Other: None. IMPRESSION: No acute intracranial abnormality. Mild cerebral atrophy. Chronic sinusitis. Electronically Signed   By: Earle Gell M.D.   On: 09/26/2017 10:34   Mr Brain Wo Contrast  Result Date: 09/27/2017 CLINICAL DATA:  Altered level of consciousness. History of schizophrenia, hyperlipidemia. EXAM: MRI HEAD WITHOUT CONTRAST TECHNIQUE: Multiplanar, multiecho pulse sequences of the brain and surrounding structures were obtained without intravenous contrast. COMPARISON:  CT head September 26, 2017 FINDINGS: Nonstandard coil used as patient's head was not accommodated by standard coil. Overall poor signal to noise ratio. Moderately motion degraded  examination. INTRACRANIAL CONTENTS: No reduced diffusion to suggest acute ischemia. No susceptibility artifact to suggest hemorrhage. The ventricles and sulci are normal for patient's age. Mild probable chronic small vessel ischemic changes. No suspicious parenchymal signal, or mass effect. No abnormal extra-axial fluid collections. No extra-axial masses. VASCULAR: Normal major intracranial vascular flow voids present at skull base. SKULL AND UPPER CERVICAL SPINE: No abnormal sellar expansion. No suspicious calvarial bone marrow signal. Craniocervical junction maintained. SINUSES/ORBITS: Small left mastoid effusion. Left maxillary sinusitis, partially characterized.The included ocular globes and orbital contents are non-suspicious. OTHER: None. IMPRESSION: 1. Technically limited examination without acute intracranial process. Electronically Signed   By: Elon Alas M.D.   On: 09/27/2017 19:06      Kalman Drape , [facility] 09/28/2017, 8:57 AM  Pager: [phone] Mon-Wed, Friday 7:00am-4:30pm Thurs 7am-11:30am  Consults: [phone]

## 2017-09-28 NOTE — Progress Notes (Signed)
osmolite 1.5 started via cortrak tube in the left nare at rate of 20 ml/hour.  Will monitor for tolerance of feeding and increase 10cc every 4 hours.

## 2017-09-28 NOTE — Progress Notes (Addendum)
SLP Cancellation/Discharge Note  Patient Details Name: Kevin Humphrey MRN: [id number] DOB: [birth year]   Cancelled treatment:    Pt remains obtunded.  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 There is little our services can offer with regard to swallowing management.  See Dr. Kirstie Mirza note dated 6/6, who is awaiting a return call from [place] guardian.  Our services will sign off.  Please reorder should there be an improvement in MS.       [name] 09/28/2017, 10:20 AM

## 2017-09-28 NOTE — Progress Notes (Signed)
Valproic acid level is low at 35. Will administer supplemental IV load of 10 mg/kg and increase scheduled dosing from 250 mg TID to 500 mg TID.   Electronically signed: Dr. Kerney Elbe

## 2017-09-28 NOTE — Progress Notes (Signed)
Initial Nutrition Assessment  DOCUMENTATION CODES:   Not applicable  INTERVENTION:  Osmolite 1.5 @ 76ml/hr advancing 48ml Q4H to goal rate of 50 ml/hr (1200 ml/24 hrs) + Prostat BID. Total regimen at goal rate providing 2000 kcal, 105 g protein, and 912 ml H2O; 100% of protein and kcal needs.   Pt high refeeding risk due to NPO > 5 days and suspected malnutrition. Check refeeding labs: magnesium, phosphorus, and potassium 1-2 days.  RD to try to get nutrition and weight history at follow up.  NUTRITION DIAGNOSIS:   Inadequate oral intake related to lethargy/confusion, inability to eat as evidenced by NPO status(SLP unable to assess given pt AMS).  GOAL:   Patient will meet greater than or equal to 90% of their needs  MONITOR:   Skin, TF tolerance, Weight trends, Labs, I & O's, Diet advancement  REASON FOR ASSESSMENT:   Malnutrition Screening Tool    ASSESSMENT:   67 y.o. admitted on 09/19/17 from SNF for small bowel obstruction secondary to previous laparotomy on 09/22/17 with suspected metabolic encephalopathy or seizures or catatonic state. PMH of dementia, schizophrenia, htn, dysphagia, depression.   Per surgery PA note 6/7 pt may be put on cortrak or TPN if SLP cannot progress to diet.  SLP unable to assess due to AMS.   Spoke with surgery PA, ok to initiate cortrak from a surgical standpoint.  Spoke with MD regarding cortrak placement and TF initiation; MD agrees with TF initiation.   Per chart pt currently 84% of UBW. Unable to confirm UBW from chart as last weight in 2017 and last measured weight in 2016. Since admission pt lost 7 lbs; 4% of weight loss within 1 week - significant for timeframe. Some weight fluctuations for admission seemed out of place, potentially volume overload status or incorrect bed weights.   Pt unable to rouse, no family at bedside. Pt with difficult NFPE; lower extremities unable to assess - unable to tell if true depletion or if pt normal body  composition.   Medications reviewed: lopressor, miralax, SMOG, zosyn.  Labs reviewed: 6/6: K+ 3.4 (L), CL 99 (L), CO2 34 (H), BG 101 (H), BUN 31 (H), albumin 2.9 (L), AST 65 (H), ALT 66 (H), total bilirubin 1.3 (H), WBC 16.2 (H), RBC 3.26 (L), hemoglobin 10 (L), HCT 30.2 (L), platelets 476 (H). 6/7: ammonia 68 (H).    Suspect malnutrition given weight loss, NPO status > 5 days, and depletions; unable to document at this time due to lack of confirmation of weight loss, pt usual body composition, and nutrition hx.   NUTRITION - FOCUSED PHYSICAL EXAM:    Most Recent Value  Orbital Region  Mild depletion  Upper Arm Region  Moderate depletion  Thoracic and Lumbar Region  No depletion  Buccal Region  Mild depletion  Temple Region  Moderate depletion  Clavicle Bone Region  No depletion  Clavicle and Acromion Bone Region  Mild depletion  Scapular Bone Region  Unable to assess  Dorsal Hand  Unable to assess  Patellar Region  Unable to assess  Anterior Thigh Region  Unable to assess  Posterior Calf Region  Unable to assess  Edema (RD Assessment)  None  Hair  Reviewed  Eyes  Unable to assess  Mouth  Reviewed  Skin  Reviewed  Nails  Reviewed      Diet Order:   Diet Order           Diet NPO time specified  Diet effective now  Diet - low sodium heart healthy         EDUCATION NEEDS:   Not appropriate for education at this time  Skin:  Skin Assessment: Skin Integrity Issues: Skin Integrity Issues:: Incisions Incisions: abdomen  Last BM:  09/26/17  Height:   Ht Readings from Last 1 Encounters:  09/20/17 5\' 11"  (1.803 m)    Weight:   Wt Readings from Last 1 Encounters:  09/27/17 153 lb 7 oz (69.6 kg)    Ideal Body Weight:  78.18 kg  BMI:  Body mass index is 21.4 kg/m.  Estimated Nutritional Needs:   Kcal:  1800-2000 kcal  Protein:  90-105 grams  Fluid:  1.8-2 L or per MD    Hope Budds, Dietetic Intern

## 2017-09-28 NOTE — Progress Notes (Signed)
Cortrak Tube Team Note:  Consult received to place a Cortrak feeding tube.   A 10 F Cortrak tube was placed in the LEFT nare and secured with a nasal bridle at 90 cm. Per the Cortrak monitor reading the tube tip is post-pyloric.   No x-ray is required. RN may begin using tube.   If the tube becomes dislodged please keep the tube and contact the Cortrak team at [email or web address] (password [email or web address]) for replacement.  If after hours and replacement cannot be delayed, place a NG tube and confirm placement with an abdominal x-ray.    Kerman Passey MS, RD, [place], [place] [phone] Pager  [phone] Weekend/On-Call Pager

## 2017-09-28 NOTE — Progress Notes (Signed)
Stat LTM EEG initiated. Dr Aroor aware.  RN educated regarding event button.

## 2017-09-28 NOTE — Progress Notes (Signed)
Palliative Care Progress Note  Reason for consult: Goals of care  [name] remains unable to participate in conversation and minimally interactive during my encounter (really only responding to pain).  I called and was able to reach [name] (Director at [facility]).  I updated her regarding Mr. Frankowski condition and clinical course over the past several days, including altered mental status, negative CT, plan for MRI and EEG, neuro evaluation with concern for seizure activity, transfer to [facility] for EEG, and placement of Coretrak tube for initiation of artificial feeding.    Answered questions to the best of my ability.  Following discussion, overall goal remains treating treatable conditions to see if he can recover to a point of being able to transition back to long term care with quality of life similar to prior to hospitalization.  Discussed concern that if this is NCSE, this carries high rate of complications and potential for poor prognosis.  Reviewed limitation of care being DNR/DNI.  We discussed that if reversible causes of AMS are not found, we will need to have further conversation regarding goals including long term artificial nutrition/hydration.  Ms. Fredonia Highland reports understanding this.  Discussed timeframe for this likely to be as soon as early next week.  - DNR/DNI - Full scope treatment up to cardiac or respiratory arrest.   - Initiate temporary tube feeds while searching for reversible causes. - Further goals conversation based on clinical course over the weekend. - In event of emergency or if consent for procedures is needed, use mobile phone contact [phone]) for [name] to reach her over the weekend.  Total time: 50 minutes Greater than 50%  of this time was spent counseling and coordinating care related to the above assessment and plan.  Micheline Rough, MD [place] Team [phone]

## 2017-09-28 NOTE — Progress Notes (Signed)
Subjective: Lying in bed obtunded and unresponsive to commands.   Objective: Current vital signs: BP (!) 150/74 (BP Location: Left Arm)   Pulse 75   Temp 97.9 F (36.6 C) (Oral)   Resp 20   Ht 5' 11"  (1.803 m)   Wt 69.6 kg (153 lb 7 oz)   SpO2 93%   BMI 21.40 kg/m  Vital signs in last 24 hours: Temp:  [97.9 F (36.6 C)-99.4 F (37.4 C)] 97.9 F (36.6 C) (06/07 0448) Pulse Rate:  [66-92] 75 (06/07 0448) Resp:  [19-25] 20 (06/07 0448) BP: (135-178)/(55-74) 150/74 (06/07 0448) SpO2:  [93 %-99 %] 93 % (06/07 0448)  Intake/Output from previous day: 06/06 0701 - 06/07 0700 In: 652.5 [IV Piggyback:652.5] Out: 460 [Urine:460] Intake/Output this shift: Total I/O In: 205 [IV Piggyback:205] Out: -  Nutritional status:  Diet Order           Diet NPO time specified  Diet effective now        Diet - low sodium heart healthy          Neurologic Exam: Ment: Continues to be lethargic and not following commands.   CN: Eyes were closed. When opened by examiner, eyes were deviated to right but could be partially overcome with oculocephalic maneuver. No nystagmus. Pupils 5 mm constricting to 3 mm with light bilaterally. Intermittent low amplitude right facial twitching was noted, a subtle finding.  Motor/Sensory: No spontaneous movement. Localizes to noxious stimuli in all 4 extremities equally. Bends legs bilaterally and turns over on side in response to sternal rub, but does not localize with upper extremities. Severely increased tone in all 4 extremities, consistent with rigidity.    Lab Results: Results for orders placed or performed during the hospital encounter of 09/19/17 (from the past 48 hour(s))  Glucose, capillary     Status: None   Collection Time: 09/26/17 11:54 AM  Result Value Ref Range   Glucose-Capillary 99 65 - 99 mg/dL   Comment 1 Notify RN    Comment 2 Document in Chart   Glucose, capillary     Status: None   Collection Time: 09/26/17 11:05 PM  Result Value  Ref Range   Glucose-Capillary 98 65 - 99 mg/dL  CBC with Differential/Platelet     Status: Abnormal   Collection Time: 09/27/17  3:12 AM  Result Value Ref Range   WBC 16.2 (H) 4.0 - 10.5 K/uL   RBC 3.26 (L) 4.22 - 5.81 MIL/uL   Hemoglobin 10.0 (L) 13.0 - 17.0 g/dL   HCT 30.2 (L) 39.0 - 52.0 %   MCV 92.6 78.0 - 100.0 fL   MCH 30.7 26.0 - 34.0 pg   MCHC 33.1 30.0 - 36.0 g/dL   RDW 14.7 11.5 - 15.5 %   Platelets 476 (H) 150 - 400 K/uL   Neutrophils Relative % 72 %   Lymphocytes Relative 13 %   Monocytes Relative 13 %   Eosinophils Relative 1 %   Basophils Relative 1 %   Neutro Abs 11.6 (H) 1.7 - 7.7 K/uL   Lymphs Abs 2.1 0.7 - 4.0 K/uL   Monocytes Absolute 2.1 (H) 0.1 - 1.0 K/uL   Eosinophils Absolute 0.2 0.0 - 0.7 K/uL   Basophils Absolute 0.2 (H) 0.0 - 0.1 K/uL   RBC Morphology POLYCHROMASIA PRESENT    WBC Morphology TOXIC GRANULATION     Comment: MILD LEFT SHIFT (1-5% METAS, OCC MYELO, OCC BANDS) Performed at [facility], [place] [address] [place],  [place] [address]   Comprehensive metabolic panel     Status: Abnormal   Collection Time: 09/27/17  3:12 AM  Result Value Ref Range   Sodium 145 135 - 145 mmol/L   Potassium 3.4 (L) 3.5 - 5.1 mmol/L   Chloride 99 (L) 101 - 111 mmol/L   CO2 34 (H) 22 - 32 mmol/L   Glucose, Bld 101 (H) 65 - 99 mg/dL   BUN 31 (H) 6 - 20 mg/dL   Creatinine, Ser 1.10 0.61 - 1.24 mg/dL   Calcium 8.7 (L) 8.9 - 10.3 mg/dL   Total Protein 6.5 6.5 - 8.1 g/dL   Albumin 2.9 (L) 3.5 - 5.0 g/dL   AST 65 (H) 15 - 41 U/L   ALT 66 (H) 17 - 63 U/L   Alkaline Phosphatase 92 38 - 126 U/L   Total Bilirubin 1.3 (H) 0.3 - 1.2 mg/dL   GFR calc non Af Amer >60 >60 mL/min   GFR calc Af Amer >60 >60 mL/min    Comment: (NOTE) The eGFR has been calculated using the CKD EPI equation. This calculation has not been validated in all clinical situations. eGFR's persistently <60 mL/min signify possible Chronic Kidney Disease.    Anion gap 12 5 - 15     Comment: Performed at [facility], [place] [address], [place], [place] [address]  Magnesium     Status: None   Collection Time: 09/27/17  3:12 AM  Result Value Ref Range   Magnesium 2.1 1.7 - 2.4 mg/dL    Comment: Performed at [facility], [place] [address] [place], [place] [address]  Phosphorus     Status: None   Collection Time: 09/27/17  3:12 AM  Result Value Ref Range   Phosphorus 3.8 2.5 - 4.6 mg/dL    Comment: Performed at [facility], [place] [address], [place], [place] [address]  Glucose, capillary     Status: None   Collection Time: 09/27/17  5:37 AM  Result Value Ref Range   Glucose-Capillary 88 65 - 99 mg/dL  Glucose, capillary     Status: Abnormal   Collection Time: 09/27/17 11:49 AM  Result Value Ref Range   Glucose-Capillary 108 (H) 65 - 99 mg/dL  Glucose, capillary     Status: None   Collection Time: 09/27/17  7:36 PM  Result Value Ref Range   Glucose-Capillary 95 65 - 99 mg/dL   Comment 1 Notify RN    Comment 2 Document in Chart   Glucose, capillary     Status: None   Collection Time: 09/28/17 12:03 AM  Result Value Ref Range   Glucose-Capillary 98 65 - 99 mg/dL  Valproic acid level     Status: Abnormal   Collection Time: 09/28/17  5:26 AM  Result Value Ref Range   Valproic Acid Lvl 35 (L) 50.0 - 100.0 ug/mL    Comment: Performed at [facility], [place]. [address], [place], [place] [address]  Ammonia     Status: Abnormal   Collection Time: 09/28/17  5:26 AM  Result Value Ref Range   Ammonia 68 (H) 9 - 35 umol/L    Comment: Performed at [facility], [place] [address], [place],  [address]  Glucose, capillary     Status: None   Collection Time: 09/28/17  6:11 AM  Result Value Ref Range   Glucose-Capillary 85 65 - 99 mg/dL    Recent Results (from the past 240 hour(s))  Blood Culture (  routine x 2)     Status: None   Collection Time: 09/19/17 11:15 PM  Result Value Ref Range Status   Specimen  Description   Final    BLOOD LEFT ARM UPPER Performed at [facility] [address], [place], [place] [address]    Special Requests   Final    BOTTLES DRAWN AEROBIC AND ANAEROBIC Blood Culture adequate volume Performed at [facility] [address], [place], [place] [address]    Culture   Final    NO GROWTH 5 DAYS Performed at [facility], [place] [address], [place], [place] [address]    Report Status 09/25/2017 FINAL  Final  Urine culture     Status: Abnormal   Collection Time: 09/20/17  1:28 AM  Result Value Ref Range Status   Specimen Description   Final    URINE, CATHETERIZED Performed at [facility] [address] [place], [place] [address]    Special Requests   Final    NONE Performed at [facility], [place] [address] [place], [place] [address]    Culture (A)  Final    >=100,000 COLONIES/mL DIPHTHEROIDS(CORYNEBACTERIUM SPECIES)   Report Status 09/21/2017 FINAL  Final  MRSA PCR Screening     Status: Abnormal   Collection Time: 09/20/17 12:37 PM  Result Value Ref Range Status   MRSA by PCR POSITIVE (A) NEGATIVE Final    Comment:        The GeneXpert MRSA Assay (FDA approved for NASAL specimens only), is one component of a comprehensive MRSA colonization surveillance program. It is not intended to diagnose MRSA infection nor to guide or monitor treatment for MRSA infections. RESULT CALLED TO, READ BACK BY AND VERIFIED WITH: Alcide Clever [id number] @ [id number] BY J SCOTTON Performed at [facility] [address] [place],  [address]     Lipid Panel No results for input(s): CHOL, TRIG, HDL, CHOLHDL, VLDL, LDLCALC in the last 72 hours.  Studies/Results: Ct Head Wo Contrast  Result Date: 09/26/2017 CLINICAL DATA:  Unexplained altered level of consciousness beginning this morning. EXAM: CT HEAD WITHOUT CONTRAST TECHNIQUE: Contiguous axial images were  obtained from the base of the skull through the vertex without intravenous contrast. COMPARISON:  12/12/2014 FINDINGS: Brain: Motion artifact noted. No evidence of acute infarction, hemorrhage, hydrocephalus, extra-axial collection, or mass lesion/mass effect. Mild diffuse cerebral atrophy is noted. Vascular:  No hyperdense vessel or other acute findings. Skull: No evidence of fracture or other significant bone abnormality. Sinuses/Orbits: Air-fluid level is seen within the left maxillary sinus, with diffuse thickening and sclerosis of the bony walls. This finding shows no significant change and is consistent with chronic sinusitis. Mucosal thickening is again noted within the sphenoid sinus. Other: None. IMPRESSION: No acute intracranial abnormality. Mild cerebral atrophy. Chronic sinusitis. Electronically Signed   By: Earle Gell M.D.   On: 09/26/2017 10:34   Mr Brain Wo Contrast  Result Date: 09/27/2017 CLINICAL DATA:  Altered level of consciousness. History of schizophrenia, hyperlipidemia. EXAM: MRI HEAD WITHOUT CONTRAST TECHNIQUE: Multiplanar, multiecho pulse sequences of the brain and surrounding structures were obtained without intravenous contrast. COMPARISON:  CT head September 26, 2017 FINDINGS: Nonstandard coil used as patient's head was not accommodated by standard coil. Overall poor signal to noise ratio. Moderately motion degraded examination. INTRACRANIAL CONTENTS: No reduced diffusion to suggest acute ischemia. No susceptibility artifact to suggest hemorrhage. The ventricles and sulci are normal for patient's age. Mild probable  chronic small vessel ischemic changes. No suspicious parenchymal signal, or mass effect. No abnormal extra-axial fluid collections. No extra-axial masses. VASCULAR: Normal major intracranial vascular flow voids present at skull base. SKULL AND UPPER CERVICAL SPINE: No abnormal sellar expansion. No suspicious calvarial bone marrow signal. Craniocervical junction maintained.  SINUSES/ORBITS: Small left mastoid effusion. Left maxillary sinusitis, partially characterized.The included ocular globes and orbital contents are non-suspicious. OTHER: None. IMPRESSION: 1. Technically limited examination without acute intracranial process. Electronically Signed   By: Elon Alas M.D.   On: 09/27/2017 19:06    Medications:  Scheduled: . bisacodyl  10 mg Rectal Daily  . Chlorhexidine Gluconate Cloth  6 each Topical Q0600  . mouth rinse  15 mL Mouth Rinse BID  . metoprolol tartrate  5 mg Intravenous Q8H  . mupirocin ointment  1 application Nasal BID  . polyethylene glycol  17 g Oral BID  . sorbitol, milk of mag, mineral oil, glycerin (SMOG) enema  960 mL Rectal Once   Continuous: . chlorproMAZINE (THORAZINE) IV    . famotidine (PEPCID) IV Stopped (09/27/17 0950)  . levETIRAcetam Stopped (09/28/17 0800)  . methocarbamol (ROBAXIN)  IV Stopped (09/26/17 0030)  . piperacillin-tazobactam (ZOSYN)  IV Stopped (09/28/17 0410)  . valproate sodium    . valproate sodium 750 mg (09/28/17 4315)    Assessment: 67 year old male with schizophrenia, dementia and episodes of confusion. Neurology was consulted for AMS after patient did not return to baseline since his surgery on 6/1. 1. EEG performed yesterday revealed a 5 minute seizure, which was concerning for NCSE. He was loaded with 1500 mg Keppra yesterday evening and transferred to [facility] for EEG monitoring. Review of LTM EEG at 6 AM revealed 3-4 runs of rhythmic paroxysmal fast activity in bilateral frontal and parietotemporal leads consistent with seizures lasting several minutes, most recent and longest one starting at 5:41 am lasting for about 15 min consistent with non convulsive status epilepticus. He was then loaded with an additional 1500 mg of Keppra with maintenance dose increased to 1500 mg BID.  2. VPA level came back low at 35. Supplemental IV load of 10 mg/kg was administered and scheduled dosing was increased from 250 mg  TID to 500 mg TID.  3. Bedside EEG review at 9:25 AM: Diffuse delta slowing in all leads is symmetric and of relatively high amplitude; it does not appear to significantly wax/wane. No sharp waves, spikes, triphasics or spike-wave complexes seen.   Recommendations: 1. Continue valproic acid at 500 mg IV TID 2. Continue Keppra at increased dose of 1500 mg IV BID 3. Continue LTM EEG. Awaiting official EEG report from Dr. Deborah Chalk.  4. If LTM EEG shows findings concerning for recurrence of NCSE, will need to transfer to ICU and place on burst suppression.     LOS: 8 days   @Electronically  signed: Dr. Kerney Elbe 09/28/2017  9:13 AM

## 2017-09-28 NOTE — Progress Notes (Signed)
Patient NPO at this time, doctor on call notified, stated that patient might be placed on TPN today will continue monitor.

## 2017-09-28 NOTE — Progress Notes (Signed)
PROGRESS NOTE    Kevin Humphrey  VQQ:[id number] DOB: Jan 28, 1951 DOA: 09/19/2017 PCP: [name], MD    Brief Narrative:  67 y.o.malewithhistory of advanced dementia, schizophrenia, hypertension was brought from the skilled nursing facility after patient was found to have persistent nausea vomiting with hematemesis. Chest x-ray done at this nursing facility was showing some infiltrates and abdomen x-ray was showing concerning for bowel obstruction.  In the ER stool for occult blood was positive. Abdomen was distended and initially patient on arrival was confused and minimally responsive but somewhat improved after starting antibiotics and fluids. CT abdomen pelvis shows bowel obstruction with transition point. Chest x-ray was showing possibility of infiltrates. Patient was hypotensive improved with fluids and empiric antibiotic started for sepsis likely from aspiration and found to have a UTI as well.   Underwent Laparoscopic Lysis of Adhesions for SBO and improved but continued to be Encephalopathic and not following commands. Because of his AMS an MRI and EEG were ordered and Neurology was consulted to evaluate and make recommendations  Assessment & Plan:   Principal Problem:   SBO (small bowel obstruction) s/p lap adhesiolysis 09/22/2017 Active Problems:   Schizophrenia ([place])   Malnutrition of moderate degree (HCC)   Acute respiratory failure with hypoxia ([place])   ARF (acute renal failure) (HCC)   Hematemesis   Sepsis ([place])   Anxiety state   Small bowel obstruction (HCC)   Hypomagnesemia   Acute lower UTI   Acute metabolic encephalopathy   Ileus (HCC)   Volume overload   Hypokalemia  High-grade small bowel obstruction that is post laparoscopic lysis of adhesions 09/22/2017 with subsequent Ileus -With small bowel obstruction secondary to adhesions.  -Patient status post laparoscopic lysis of adhesions 09/22/2017 prior [name].  -Patient had ileus with no  bowel movements and no flatus and hypoactive bowel sounds as of 09/23/2017.  -Patient initially started on clears with plans to advance diet as tolerated -Patient noted to have concerns for depressed consciousness and even seizure activity.  Patient subsequent transferred from [facility] to [facility].  See below -Keep magnesium greater than 2. Keep potassium greater than 4.  -Basic metabolic panel in the morning  Acute respiratory failure with hypoxia secondary to volume overload -Patient noted to have worsening shortness of breaththe night of 09/23/2017 to the morning of 09/24/2017,with increased O2 requirements requiring 8 L of high flow nasal cannula.  -Chest x-ray obtained consistent with volume overload.  -Urine output not accurately recorded.   -Patient has been continued on IV Lasix, now held. -Continue strict I's and O's. Daily weights.   Acute Metabolic Encephalopathy in the setting of Advanced Dementia -Suspicion for medication induced cephalopathy. -Obtain Head CT w/o Contrast: Showed No acute intracranial abnormality. Mild cerebral atrophy. Chronic sinusitis. -Concerns for possible seizure activity.  EEG obtained.  Neurology following.  She is continued on antiseizure medications. -Appreciate input by palliative care service.  Overall prognosis seems to be poor at this time. -NG tube placed for tube feeds.   Acute Renal Failure -Likely secondary to prerenal azotemia in the setting of ACE inhibitor.  -Patient noted on admission to be hypotensive.  -Patient hydrated aggressively with IV fluids. Renal function improved and currently at baseline. BUN/Cr is 31/1.10 -Patient now noted to be in volume overload. Patient was IV Lasix and now D/C'd.  -We will repeat basic metabolic panel in the morning  Sepsis likely secondary to aspiration pneumonia and Diphtheroids UTI -Per CT abdomen and pelvis as well  as chest x-ray concerning for probable pneumonia.   -Patient on admission noted to be septic with hypotension, acute renal failure, tachycardia, elevated lactic acid level, elevated procalcitonin level.  -Lactic acid level and pro calcitonin levels trending down.  -Urine cultures with >100000 colonies of Diphtheroids.  -Blood cultures pending with no growth to date. IV vancomycin has been discontinued.  -Continue IV Zosyn D7/7.  -Repeat CBC in the morning  Anemia/Hematemesis -Likely dilutional in nature.  -Patient on admission was noted to have some hematemesis however no hematemesis noted during the hospitalization.  -Patient with no overt bleeding.  -Anemia panel has been checked and iron level at 11. Ferritin at 142. Vitamin B12 levels at 282.  -Continue vitamin B12 IM injections.  -Hb/Hct now stable at 10.0/30.2 -Patient s/pIV Feraheme.  -Follow H&H, transfusion threshold hemoglobin less than 8.  -Will likely need oral iron supplementation on discharge.  -Continue PPI.  -Continue to Monitor for S/Sx of Bleeding -Recheck CBC in the morning  Hypertension -On admission patient noted to be septic with hypotension.  -Blood pressure improved. BP elevated.  -Antihypertensive medications on hold secondary to problem #1.  -Continue IV Lopressor and IV Hydralazine.  -Diuretics were continued as per above  Schizophrenia -Continue IV Depakote 250 mg q8h.  -Patient poorly responsive today.  Per above  Hypomagnesemia -Improved and is now 2.1 -Continue to Monitor and Replete as Necessary -Continue to follow electrolytes  Hypernatremia -Improved.  Repeat basic metabolic panel in the morning  Hypokalemia -Repeat basic metabolic panel in the morning -To need to replace as needed  Abnormal LFTs -Patient's AST was 5 and ALT was 66 -Possibly in the setting of surgery -Repeat LFTs in the morning  Hyperbilirubinemia -Mild at 1.3 -Continue to monitor and recheck CMP in the  a.m.  Thrombocytosis -Likely reactive in the setting of aspiration infection along with UTI -Patient's platelet count went from 402 is now 476 -Continue to Monitor and recheck CBC in AM    DVT prophylaxis: SCDs Code Status: DO NOT RESUSCITATE/DO NOT INTUBATE Family Communication: Patient in room, family not at bedside Disposition Plan: Uncertain at this time  Consultants:   [place]   Neurology Dr. Cheral Marker  Procedures:   Patient status post lysis of adhesions  Antimicrobials: Anti-infectives (From admission, onward)   Start     Dose/Rate Route Frequency Ordered Stop   09/22/17 1000  cefoTEtan (CEFOTAN) 2 g in sodium chloride 0.9 % 100 mL IVPB     2 g 200 mL/hr over 30 Minutes Intravenous On call to O.R. 09/22/17 0852 09/22/17 1600   09/21/17 1700  vancomycin (VANCOCIN) IVPB 750 mg/150 ml premix  Status:  Discontinued     750 mg 150 mL/hr over 60 Minutes Intravenous Every 12 hours 09/21/17 0956 09/22/17 0959   09/21/17 0600  vancomycin (VANCOCIN) IVPB 1000 mg/200 mL premix  Status:  Discontinued     1,000 mg 200 mL/hr over 60 Minutes Intravenous Every 24 hours 09/20/17 0628 09/20/17 0921   09/21/17 0600  vancomycin (VANCOCIN) IVPB 750 mg/150 ml premix  Status:  Discontinued     750 mg 150 mL/hr over 60 Minutes Intravenous Every 24 hours 09/20/17 0921 09/21/17 0956   09/20/17 0800  piperacillin-tazobactam (ZOSYN) IVPB 3.375 g     3.375 g 12.5 mL/hr over 240 Minutes Intravenous Every 8 hours 09/20/17 0612     09/20/17 0130  vancomycin (VANCOCIN) 1,500 mg in sodium chloride 0.9 % 500 mL IVPB     1,500 mg  250 mL/hr over 120 Minutes Intravenous  Once 09/20/17 0100 09/20/17 0453   09/20/17 0100  piperacillin-tazobactam (ZOSYN) IVPB 3.375 g     3.375 g 100 mL/hr over 30 Minutes Intravenous  Once 09/20/17 0047 09/20/17 0214   09/20/17 0100  vancomycin (VANCOCIN) IVPB 1000 mg/200 mL premix  Status:  Discontinued     1,000 mg 200 mL/hr over 60 Minutes  Intravenous  Once 09/20/17 0047 09/20/17 0100       Subjective: Unable to obtain this time given patient's unresponsive state  Objective: Vitals:   09/28/17 0448 09/28/17 0941 09/28/17 1333 09/28/17 1807  BP: (!) 150/74 (!) 173/74 (!) 156/76 (!) 146/77  Pulse: 75 73 70 73  Resp: 20 16 16 20   Temp: 97.9 F (36.6 C) (!) 97.5 F (36.4 C) (!) 97.5 F (36.4 C) 98.4 F (36.9 C)  TempSrc: Oral Oral Oral Oral  SpO2: 93% 93% 100% 95%  Weight:      Height:        Intake/Output Summary (Last 24 hours) at 09/28/2017 1836 Last data filed at 09/28/2017 0723 Gross per 24 hour  Intake 405 ml  Output 200 ml  Net 205 ml   Filed Weights   09/25/17 0229 09/26/17 0201 09/27/17 0413  Weight: 75.2 kg (165 lb 12.6 oz) 70.5 kg (155 lb 6.8 oz) 69.6 kg (153 lb 7 oz)    Examination:  General exam: Appears calm and comfortable  Respiratory system: Clear to auscultation. Respiratory effort normal. Cardiovascular system: S1 & S2 heard, RRR Gastrointestinal system: Abdomen is nondistended, soft and nontender. No organomegaly or masses felt. Normal bowel sounds heard. Central nervous system: Alert and oriented. No focal neurological deficits. Extremities: Symmetric 5 x 5 power. Skin: No rashes, lesions Psychiatry: Able to obtain as patient is presently unresponsive  Data Reviewed: I have personally reviewed following labs and imaging studies  CBC: Recent Labs  Lab 09/22/17 0743 09/23/17 0343 09/24/17 0513 09/25/17 0758 09/26/17 0259 09/27/17 0312  WBC 11.3* 12.5* 14.2* 18.1* 17.7* 16.2*  NEUTROABS 9.2* 11.1*  --   --   --  11.6*  HGB 8.3* 8.9* 8.8* 9.8* 9.4* 10.0*  HCT 25.1* 25.2* 26.6* 29.5* 27.9* 30.2*  MCV 94.0 92.3 93.7 92.5 91.2 92.6  PLT 207 260 245 402* 408* 468*   Basic Metabolic Panel: Recent Labs  Lab 09/23/17 0343 09/24/17 0513 09/25/17 0758 09/26/17 0259 09/27/17 0312  NA 143 143 143 147* 145  K 4.3 3.8 3.7 3.3* 3.4*  CL 114* 112* 105 103 99*  CO2 21* 21* 25 35*  34*  GLUCOSE 102* 88 97 101* 101*  BUN 20 19 19  21* 31*  CREATININE 0.85 0.83 0.93 1.03 1.10  CALCIUM 8.0* 8.2* 8.4* 8.6* 8.7*  MG 1.9 1.7 2.1 2.3 2.1  PHOS  --   --   --   --  3.8   GFR: Estimated Creatinine Clearance: 63.3 mL/min (by C-G formula based on SCr of 1.1 mg/dL). Liver Function Tests: Recent Labs  Lab 09/27/17 0312  AST 65*  ALT 66*  ALKPHOS 92  BILITOT 1.3*  PROT 6.5  ALBUMIN 2.9*   No results for input(s): LIPASE, AMYLASE in the last 168 hours. Recent Labs  Lab 09/28/17 0526  AMMONIA 68*   Coagulation Profile: No results for input(s): INR, PROTIME in the last 168 hours. Cardiac Enzymes: No results for input(s): CKTOTAL, CKMB, CKMBINDEX, TROPONINI in the last 168 hours. BNP (last 3 results) No results for input(s): PROBNP in the last 8760 hours.  HbA1C: No results for input(s): HGBA1C in the last 72 hours. CBG: Recent Labs  Lab 09/27/17 0537 09/27/17 1149 09/27/17 1936 09/28/17 0003 09/28/17 0611  GLUCAP 88 108* 95 98 85   Lipid Profile: No results for input(s): CHOL, HDL, LDLCALC, TRIG, CHOLHDL, LDLDIRECT in the last 72 hours. Thyroid Function Tests: No results for input(s): TSH, T4TOTAL, FREET4, T3FREE, THYROIDAB in the last 72 hours. Anemia Panel: No results for input(s): VITAMINB12, FOLATE, FERRITIN, TIBC, IRON, RETICCTPCT in the last 72 hours. Sepsis Labs: Recent Labs  Lab 09/22/17 0743  PROCALCITON 1.06    Recent Results (from the past 240 hour(s))  Blood Culture (routine x 2)     Status: None   Collection Time: 09/19/17 11:15 PM  Result Value Ref Range Status   Specimen Description   Final    BLOOD LEFT ARM UPPER Performed at [facility], [place] [address] [place], [place] [address]    Special Requests   Final    BOTTLES DRAWN AEROBIC AND ANAEROBIC Blood Culture adequate volume Performed at [place] [address] [place], [place] [address]    Culture   Final    NO GROWTH 5  DAYS Performed at [facility], [place] [address] [place], [place] [address]    Report Status 09/25/2017 FINAL  Final  Urine culture     Status: Abnormal   Collection Time: 09/20/17  1:28 AM  Result Value Ref Range Status   Specimen Description   Final    URINE, CATHETERIZED Performed at [place] [address] [place], [place] [address]    Special Requests   Final    NONE Performed at [facility], [place] [address], [place], [place] [address]    Culture (A)  Final    >=100,000 COLONIES/mL DIPHTHEROIDS(CORYNEBACTERIUM SPECIES)   Report Status 09/21/2017 FINAL  Final  MRSA PCR Screening     Status: Abnormal   Collection Time: 09/20/17 12:37 PM  Result Value Ref Range Status   MRSA by PCR POSITIVE (A) NEGATIVE Final    Comment:        The GeneXpert MRSA Assay (FDA approved for NASAL specimens only), is one component of a comprehensive MRSA colonization surveillance program. It is not intended to diagnose MRSA infection nor to guide or monitor treatment for MRSA infections. RESULT CALLED TO, READ BACK BY AND VERIFIED WITH: Alcide Clever [id number] @ 2423 BY J SCOTTON Performed at [place] [address], [place], [place] [address]      Radiology Studies: Mr Brain Wo Contrast  Result Date: 09/27/2017 CLINICAL DATA:  Altered level of consciousness. History of schizophrenia, hyperlipidemia. EXAM: MRI HEAD WITHOUT CONTRAST TECHNIQUE: Multiplanar, multiecho pulse sequences of the brain and surrounding structures were obtained without intravenous contrast. COMPARISON:  CT head September 26, 2017 FINDINGS: Nonstandard coil used as patient's head was not accommodated by standard coil. Overall poor signal to noise ratio. Moderately motion degraded examination. INTRACRANIAL CONTENTS: No reduced diffusion to suggest acute ischemia. No susceptibility artifact to suggest hemorrhage. The ventricles and sulci are normal for  patient's age. Mild probable chronic small vessel ischemic changes. No suspicious parenchymal signal, or mass effect. No abnormal extra-axial fluid collections. No extra-axial masses. VASCULAR: Normal major intracranial vascular flow voids present at skull base. SKULL AND UPPER CERVICAL SPINE: No abnormal sellar expansion. No suspicious calvarial bone marrow signal. Craniocervical junction maintained. SINUSES/ORBITS: Small left mastoid effusion. Left maxillary sinusitis, partially characterized.The included ocular globes  and orbital contents are non-suspicious. OTHER: None. IMPRESSION: 1. Technically limited examination without acute intracranial process. Electronically Signed   By: Elon Alas M.D.   On: 09/27/2017 19:06    Scheduled Meds: . bisacodyl  10 mg Rectal Daily  . Chlorhexidine Gluconate Cloth  6 each Topical Q0600  . feeding supplement (PRO-STAT SUGAR FREE 64)  30 mL Per Tube BID  . mouth rinse  15 mL Mouth Rinse BID  . metoprolol tartrate  5 mg Intravenous Q8H  . mupirocin ointment  1 application Nasal BID  . polyethylene glycol  17 g Oral BID  . sorbitol, milk of mag, mineral oil, glycerin (SMOG) enema  960 mL Rectal Once   Continuous Infusions: . chlorproMAZINE (THORAZINE) IV    . famotidine (PEPCID) IV Stopped (09/28/17 1135)  . feeding supplement (OSMOLITE 1.5 CAL) 1,000 mL (09/28/17 1643)  . levETIRAcetam 1,500 mg (09/28/17 1802)  . methocarbamol (ROBAXIN)  IV Stopped (09/26/17 0030)  . piperacillin-tazobactam (ZOSYN)  IV 3.375 g (09/28/17 1609)  . valproate sodium Stopped (09/28/17 1430)     LOS: 8 days   Marylu Lund, MD [facility] Pager [phone]  If 7PM-7AM, please contact night-coverage [email or web address] Password [facility] 09/28/2017, 6:36 PM

## 2017-09-28 NOTE — Procedures (Signed)
  Video EEG Monitoring Report     Dates of monitoring:   09/28/16 @01 :10 to 09/28/16 @13 :00 Recording day:    1 (started on 6/7) EEG Number:    n/a Requesting provider:   [place] Interpreting physician:  Becky Sax, MD  CPT:  [phone] ICD-10:  G60.901   Present History: 67 year old male with schizophrenia, dementia and episodes of confusion.   EEG Classification  Abnormal,  Stupor  There is no posterior dominant rhythm HR  70 bpm and regular     Background abnormalities:   1. Continuous slow, generalized    Periodic, rhythmic or epileptiform abnormalities:   1. GRDA    Ictal phenomena:  none    EEG DETAILS:  TYPE OF RECORDING: At least 18-channel digital EEG with using a standard international 10-20 placement with additional EEG electrodes, and 1 additional channel dedicated to the EKG, at a sampling rate of at least 256 Hz. Video was recorded throughout the study. The recording was interpreted using digital review software allowing for montage reformatting, gain and filter changes. Each page was reviewed manually. Automatic spike and seizure detection software and quantitative analysis tools were used as needed.   Description of EEG features: The recording reveals a  continuous, variable and poorly reactive symmetric background.  This consists of diffuse, medium amplitude 5-6 Hz sinusoid background with no good antero-posterior gradient, There is underlying slower delta, but no other rhythms of note.   Periodic or rhythmic abnormalities: Relative arousals are seen with development of a diffuse strikingly 4-5 Hz rhythmic, sinusoid activity. This is associated at times with diffuse myogenic artifact and eye movements. There eis no specific evolution, no sharp elements and no focality. The exact same pattern is seen with patient stimulation.   Push Button Events: none  Interpretation: This EEG is indicative of a moderate-to-severe diffuse encephalopathy, but  non-specific as to etiology.   The pattern of generalized rhythmic delta activity is most consistent with an encephalopathic arousal pattern in the spectrum of SIRPIDs  No unequivocal epileptiform discharges, and no focal or lateralizing signs are seen.

## 2017-09-28 NOTE — Care Management Note (Signed)
Case Management Note  Patient Details  Name: Kevin Humphrey MRN: [id number] Date of Birth: [birth year]  Subjective/Objective:                    Action/Plan: Plan is for patient to return to [facility] when medically ready. CM following.  Expected Discharge Date:  (unknown)               Expected Discharge Plan:  Skilled Nursing Facility  In-House Referral:  Clinical Social Work  Discharge planning Services  CM Consult  Post Acute Care Choice:    Choice offered to:     DME Arranged:    DME Agency:     HH Arranged:    [place] Agency:     Status of Service:  In process, will continue to follow  If discussed at Long Length of Stay Meetings, dates discussed:    Additional Comments:  Pollie Friar, RN 09/28/2017, 3:43 PM

## 2017-09-28 NOTE — Care Management Important Message (Signed)
Important Message  Patient Details  Name: Kevin Humphrey MRN: [id number] Date of Birth: 1950-05-28   Medicare Important Message Given:  No  Due to illness patient is not able to sign.  Unsigned copy left   Jumanah Hynson 09/28/2017, 3:34 PM

## 2017-09-28 NOTE — Progress Notes (Addendum)
67 y.o. male with past medical history of schizophrenia, dementia, confusion, hyperlipidemia that was originally admitted to [facility] for a bowel obstruction and sepsis.  Neurology was consulted for AMS after patient has not returned to baseline since surgery on 09/22/2017.  Patient seen by my colleague Dr. Cheral Marker for altered mental status. Suspected  metabolic encephalopathy vs seizures versus catatonic state.  Recommendations included a EEG and MRI brain.    Routine EEG performed in the afternoon  showing 5 minutes seizure during the recording.  I was notified about the results by Dr. Gifford Shave around 7:30 PM last night. MRI Brain - significantly motion limited. Nothing grossly abnormal noted.    I loaded the patient with Keppra 1.5 g and requested immediate transfer to [facility].  hospital for video EEG monitoring due to concern for convulsive status epilepticus.On valproic acid and levels were ordered.   On examination at time of arrival to [facility],  the patient is lethargic and not following commands.  Eyes were closed however when trying to open patient would forcibly close eyes.    No forced gaze deviation, pupils were  enlarged 5 mm bilaterally but reactive.No involuntary movement, however localizes to all 4 extremities equally.  I reviewed the continuous EEG recording at 2am- continuous generalized slowing, no obvious seizure activity.   Plan Continue Keppra 750 mg BID obtain VPA levels and adjust VPA based on level   ####  On reviewing EEG  around 6 Am -It  appears the patient has had a least 3-4 runs of rhythmic paroxysmal fast activity in bilateral fronto parietotemporal leads consistent with seizures lasting several minutes, most recent and longest one starting at 5.41 am lasting for about 70min consistent with non convulsive status epilepticus. Patient exam unchanged.    I will load with another 1.5 g Keppra and increase Keppra maintenance to 1.5 g twice daily. VPA level is drawn- still  pending, will follow and decide whether to bolus with valproic acid or add another agent such as Vimpat.  ### Reviewing EEG at 6.40 - patient still has not received Keppra, however just received morning Valproic acid dose. EEG no longer showing seizure activity.      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   He is at risk for significant risk of neurological worsening seizures, cerebral edema,  death from brain herniation, heart failure,  infection, respiratory failure. This patient's care requires constant monitoring of vital signs, hemodynamics, respiratory and cardiac monitoring, review of multiple databases, neurological assessment, discussion with family, other specialists and medical decision making of high complexity.  I spent  45 minutes of neurocritical time in the care of this patient.

## 2017-09-29 LAB — MAGNESIUM: MAGNESIUM: 2.3 mg/dL (ref 1.7–2.4)

## 2017-09-29 LAB — BASIC METABOLIC PANEL
ANION GAP: 8 (ref 5–15)
BUN: 30 mg/dL — AB (ref 6–20)
CO2: 31 mmol/L (ref 22–32)
Calcium: 7.9 mg/dL — ABNORMAL LOW (ref 8.9–10.3)
Chloride: 107 mmol/L (ref 101–111)
Creatinine, Ser: 0.96 mg/dL (ref 0.61–1.24)
Glucose, Bld: 150 mg/dL — ABNORMAL HIGH (ref 65–99)
Potassium: 3.2 mmol/L — ABNORMAL LOW (ref 3.5–5.1)
SODIUM: 146 mmol/L — AB (ref 135–145)

## 2017-09-29 LAB — GLUCOSE, CAPILLARY
GLUCOSE-CAPILLARY: 144 mg/dL — AB (ref 65–99)
Glucose-Capillary: 159 mg/dL — ABNORMAL HIGH (ref 65–99)
Glucose-Capillary: 174 mg/dL — ABNORMAL HIGH (ref 65–99)
Glucose-Capillary: 140 mg/dL — ABNORMAL HIGH (ref 65–99)
Glucose-Capillary: 170 mg/dL — ABNORMAL HIGH (ref 65–99)

## 2017-09-29 LAB — VALPROIC ACID LEVEL: Valproic Acid Lvl: 55 ug/mL (ref 50.0–100.0)

## 2017-09-29 MED ORDER — SODIUM CHLORIDE 0.9 % IV SOLN: 400.0000 mg | Freq: Once | INTRAVENOUS | Status: DC

## 2017-09-29 MED ORDER — SODIUM CHLORIDE 0.9 % IV SOLN
200.0000 mg | Freq: Two times a day (BID) | INTRAVENOUS | Status: DC
  Administered 2017-09-29 – 2017-10-10 (×23): 200 mg via INTRAVENOUS
  Filled 2017-09-29 (×26): qty 20

## 2017-09-29 MED ORDER — LORAZEPAM 2 MG/ML IJ SOLN
2.0000 mg | Freq: Four times a day (QID) | INTRAMUSCULAR | Status: DC
  Administered 2017-09-29 – 2017-10-01 (×8): 2 mg via INTRAVENOUS
  Filled 2017-09-29 (×8): qty 1

## 2017-09-29 MED ORDER — POTASSIUM CHLORIDE 20 MEQ/15ML (10%) PO SOLN
40.0000 meq | Freq: Once | ORAL | Status: AC
  Administered 2017-09-29: 40 meq
  Filled 2017-09-29: qty 30

## 2017-09-29 NOTE — Progress Notes (Addendum)
Subjective:  Lying in bed obtunded and unresponsive to commands or harsh stimuli. PT doesn't appear to be in acute distress, LTM EEG ongoing.   Objective: Current vital signs: BP (!) 115/59 (BP Location: Left Arm)   Pulse 75   Temp 98.1 F (36.7 C) (Axillary)   Resp (!) 22   Ht 5\' 11"  (1.803 m)   Wt 72.8 kg (160 lb 7.9 oz)   SpO2 96%   BMI 22.38 kg/m  Vital signs in last 24 hours: Temp:  [97.5 F (36.4 C)-99.2 F (37.3 C)] 98.1 F (36.7 C) (06/08 0755) Pulse Rate:  [70-86] 75 (06/08 0755) Resp:  [16-22] 22 (06/08 0755) BP: (115-156)/(59-96) 115/59 (06/08 0755) SpO2:  [91 %-100 %] 96 % (06/08 0755) Weight:  [72.8 kg (160 lb 7.9 oz)] 72.8 kg (160 lb 7.9 oz) (06/08 0411)  Intake/Output from previous day: 06/07 0701 - 06/08 0700 In: 917.7 [NG/GT:452.7; IV Piggyback:465] Out: 550 [Urine:550] Intake/Output this shift: No intake/output data recorded. Nutritional status:  Diet Order           Diet NPO time specified  Diet effective now        Diet - low sodium heart healthy          Neurologic Exam: Mentation: Continues to be lethargic, obtunded, not easily arouable and not following commands.   CN: Eyes were closed, when opened, was noted to be deviated to the right as patient was lying on the right side, able to move head and assess ocular movements. Pupils were wide 5 mm constricting to 3 mm with light bilaterally.  Motor/Sensory: No spontaneous movement. Localizes to noxious stimuli in all 4 extremities equally.  Not responding to sternal rubs, severely increased tone in all 4 extremities, consistent with rigidity.  Reflex and patellar reflexes muted   Lab Results:   Recent Results (from the past 240 hour(s))  Blood Culture (routine x 2)     Status: None   Collection Time: 09/19/17 11:15 PM  Result Value Ref Range Status   Specimen Description   Final    BLOOD LEFT ARM UPPER Performed at [facility] [address], [place], [place] [address]    Special Requests   Final    BOTTLES DRAWN AEROBIC AND ANAEROBIC Blood Culture adequate volume Performed at [facility] [address] [place], [place] [address]    Culture   Final    NO GROWTH 5 DAYS Performed at [facility], [place] [address], [place], [place] [address]    Report Status 09/25/2017 FINAL  Final  Urine culture     Status: Abnormal   Collection Time: 09/20/17  1:28 AM  Result Value Ref Range Status   Specimen Description   Final    URINE, CATHETERIZED Performed at [facility] [address], [place], [place] [address]    Special Requests   Final    NONE Performed at [facility], [place] [address] [place], [place] [address]    Culture (A)  Final    >=100,000 COLONIES/mL DIPHTHEROIDS(CORYNEBACTERIUM SPECIES)   Report Status 09/21/2017 FINAL  Final  MRSA PCR Screening     Status: Abnormal   Collection Time: 09/20/17 12:37 PM  Result Value Ref Range Status   MRSA by PCR POSITIVE (A) NEGATIVE Final    Comment:        The GeneXpert MRSA Assay (FDA approved for NASAL specimens only), is one component of a comprehensive MRSA colonization surveillance  program. It is not intended to diagnose MRSA infection nor to guide or monitor treatment for MRSA infections. RESULT CALLED TO, READ BACK BY AND VERIFIED WITH: Alcide Clever [id number] @ [id number] BY J SCOTTON Performed at [facility] [address], [place], [place] [address]      Studies/Results: Mr Brain Wo Contrast 09/27/2017 IMPRESSION:  1. Technically limited examination without acute intracranial process.    Medications:  Scheduled: . bisacodyl  10 mg Rectal Daily  . Chlorhexidine Gluconate Cloth  6 each Topical Q0600  . feeding supplement (PRO-STAT SUGAR FREE 64)  30 mL Per Tube BID  . LORazepam  2 mg Intravenous Q6H  . mouth rinse  15 mL Mouth Rinse BID  . metoprolol tartrate  5 mg Intravenous Q8H  . mupirocin  ointment  1 application Nasal BID  . polyethylene glycol  17 g Oral BID  . sorbitol, milk of mag, mineral oil, glycerin (SMOG) enema  960 mL Rectal Once   Continuous: . chlorproMAZINE (THORAZINE) IV    . famotidine (PEPCID) IV 20 mg (09/29/17 0932)  . feeding supplement (OSMOLITE 1.5 CAL) 1,000 mL (09/29/17 0406)  . lacosamide (VIMPAT) IV    . levETIRAcetam Stopped (09/29/17 0924)  . methocarbamol (ROBAXIN)  IV Stopped (09/26/17 0030)  . piperacillin-tazobactam (ZOSYN)  IV 3.375 g (09/29/17 0926)  . valproate sodium Stopped (09/29/17 0658)     LTM EEG 09/29/17 TYPE OF RECORDING: At least 18-channel digital EEG with using a standard international 10-20 placement with additional EEG electrodes, and 1 additional channel dedicated to the EKG, at a sampling rate of at least 256 Hz.   Background activities marked by disorganized delta and theta frequencies at times reaching 5 to 6 cps with some posterior gradient.  More prominent slowing noted across right hemisphere particularly right frontocentral and central parietal cortex at times caring sharply contoured morphology and poorly formed sharp waves.  Frequently background activity is interrupted by buildup of 4 to 5 cps rhythmic synchronized evolving sharp waves distributed broadly between 2 hemispheres however appeared to be slight high amplitude and better developed across the right central parietal and frontal central cortex.  Clinically appear to be some neck rhythmic movements and head movements forward lateralization is difficult to discern.  This pattern most consistent with electrographic seizures and can last for several minutes with subsequent resolution.  This seizures occur throughout the day with last seizure around 1 AM.  Clinical interpretation: This day 2 of intensive EEG monitoring with simultaneous video monitoring is abnormal for several reasons #1 background activity slowing and disorganization suggestive of encephalopathy of  nonspecific etiologies.  #2 slightly more prominent slowing across right frontocentral central parietal cortex with some sharp waves in that region suggestive of potentially additional neuronal dysfunction and cortical irritability.  #3 multiple electrographic seizures with subtle clinical accompaniment as discussed above were recorded.  Based on clinical manifestations and EEG pattern seizures most likely from a frontal onset.  Lateralization is less clear right frontocentral or paracentral anterior cortex is likely based on EEG appearance but not definitive.   Assessment: 67 year old male with schizophrenia, dementia and episodes of confusion. Neurology was consulted for AMS after patient did not return to baseline since his surgery on 6/1. 1. EEG performed 6/6 revealed a 5 minute seizure, which was concerning for NCSE. He was loaded with 1500 mg Keppra yesterday evening and transferred to [facility] for EEG monitoring. Review of LTM EEG at 6 AM revealed 3-4 runs of rhythmic paroxysmal  fast activity in bilateral frontal and parietotemporal leads consistent with seizures lasting several minutes, most recent and longest one starting at 5:41 am lasting for about 15 min consistent with non convulsive status epilepticus. He was then loaded with an additional 1500 mg of Keppra with maintenance dose increased to 1500 mg BID.  LTM overnight into today, 09/29/2017, shows patient having multiple electrographic seizures throughout the day with a last seizure around 1am. 2. VPA level came back low at 35 yesterday.  S/p one-time supplemental load and increase to 500 mg 3 times daily.  Repeat VPA level pending this am.    Recommendations: 1. Continue valproic acid at 500 mg IV TID 2. Continue Keppra at increased dose of 1500 mg IV BID 3. Start Vimpat 200 mcg IV twice daily 4. Start lorazepam 2 mg IV every 6 hours 5. Continue LTM EEG for another 24 hours with new med changes 4. LTM EEG  concerning for recurrence of NCSE,  patient to remain on current unit.   LOS: 9 days   Carney Bern DNP 11:08 AM 09/29/2017  Electronically signed: Dr. Kerney Elbe

## 2017-09-29 NOTE — Progress Notes (Signed)
Conductive paste added to O1, O2, Pz, P3, P4. No skin breakdown noted on any of those leads as well as frontal leads.

## 2017-09-29 NOTE — Progress Notes (Signed)
PROGRESS NOTE    Kevin Humphrey  JKD:[id number] DOB: May 10, 1950 DOA: 09/19/2017 PCP: [name], MD    Brief Narrative:  67 y.o.malewithhistory of advanced dementia, schizophrenia, hypertension was brought from the skilled nursing facility after patient was found to have persistent nausea vomiting with hematemesis. Chest x-ray done at this nursing facility was showing some infiltrates and abdomen x-ray was showing concerning for bowel obstruction.  In the ER stool for occult blood was positive. Abdomen was distended and initially patient on arrival was confused and minimally responsive but somewhat improved after starting antibiotics and fluids. CT abdomen pelvis shows bowel obstruction with transition point. Chest x-ray was showing possibility of infiltrates. Patient was hypotensive improved with fluids and empiric antibiotic started for sepsis likely from aspiration and found to have a UTI as well.   Underwent Laparoscopic Lysis of Adhesions for SBO and improved but continued to be Encephalopathic and not following commands. Because of his AMS an MRI and EEG were ordered and Neurology was consulted to evaluate and make recommendations  Assessment & Plan:   Principal Problem:   SBO (small bowel obstruction) s/p lap adhesiolysis 09/22/2017 Active Problems:   Schizophrenia ([place])   Malnutrition of moderate degree (HCC)   Acute respiratory failure with hypoxia ([place])   ARF (acute renal failure) (HCC)   Hematemesis   Sepsis ([place])   Anxiety state   Small bowel obstruction (HCC)   Hypomagnesemia   Acute lower UTI   Acute metabolic encephalopathy   Ileus (HCC)   Volume overload   Hypokalemia  High-grade small bowel obstruction that is post laparoscopic lysis of adhesions 09/22/2017 with subsequent Ileus -With small bowel obstruction secondary to adhesions.  -Patient status post laparoscopic lysis of adhesions 09/22/2017 prior [name].  -Patient had ileus with no  bowel movements and no flatus and hypoactive bowel sounds as of 09/23/2017.  -Patient initially started on clears with plans to advance diet as tolerated -Patient noted to have concerns for depressed consciousness and even seizure activity.  Patient subsequent transferred from [facility] to [facility].  See below -Keep magnesium greater than 2. Keep potassium greater than 4.  -Potassium 3.2. Will give 105meq per tube. Repeat lytes in AM  Acute respiratory failure with hypoxia secondary to volume overload -Patient noted to have worsening shortness of breaththe night of 09/23/2017 to the morning of 09/24/2017,with increased O2 requirements requiring 8 L of high flow nasal cannula.  -Chest x-ray obtained consistent with volume overload.  -Urine output not accurately recorded.   -Patient had been continued on IV Lasix, now held. -Continue with strict I's and O's. Continue daily weights.   Acute Metabolic Encephalopathy in the setting of Advanced Dementia -Suspicion for medication induced cephalopathy. -Obtain Head CT w/o Contrast: Showed No acute intracranial abnormality. Mild cerebral atrophy. Chronic sinusitis. -Appreciate input by palliative care service.  Overall prognosis seems to be poor at this time. -Continue NG tube placed for tube feeds.  -Concerns for possible seizure activity. Discussed with Neurology. AEDs titrated. Continue to monitor. Possible need for burst suppression noted  Acute Renal Failure -Likely secondary to prerenal azotemia in the setting of ACE inhibitor.  -Patient noted on admission to be hypotensive.  -Patient hydrated aggressively with IV fluids. Renal function improved and currently at baseline, improved -Patient now noted to be in volume overload. Patient was continued on IV Lasix and has now been D/C'd.  -Recheck basic metabolic panel in the morning  Sepsis likely secondary to aspiration pneumonia and  Diphtheroids UTI -Per CT  abdomen and pelvis as well as chest x-ray concerning for probable pneumonia.  -Patient on admission noted to be septic with hypotension, acute renal failure, tachycardia, elevated lactic acid level, elevated procalcitonin level.  -Lactic acid level and pro calcitonin levels trending down.  -Urine cultures with >100000 colonies of Diphtheroids.  -Blood cultures pending with no growth to date. IV vancomycin was discontinued.  -completed IV Zosyn -Follow up CBC in the morning  Anemia/Hematemesis -Likely dilutional in nature.  -Patient on admission was noted to have some hematemesis however no hematemesis noted during the hospitalization.  -Patient with no overt bleeding.  -Anemia panel has been checked and iron level at 11. Ferritin at 142. Vitamin B12 levels at 282.  -Continue vitamin B12 IM injections.  -Hb/Hct now stable at 10.0/30.2 -Patient s/pIV Feraheme.  -Follow H&H, transfusion threshold hemoglobin less than 8.  -Will likely need oral iron supplementation on discharge.  -Continue PPI.  -Continue to Monitor for S/Sx of Bleeding -Recheck CBC in the morning  Hypertension -On admission patient noted to be septic with hypotension.  -Blood pressure improved. BP elevated.  -Antihypertensive medications on hold secondary to problem #1.  -Continue IV Lopressor and IV Hydralazine.  -Diuretics were discontinued as per above  Schizophrenia -Continue IV Depakote 250 mg q8h.  -Patient remains poorly responsive, see below  Hypomagnesemia -Continue to Monitor and Replete as Necessary -Labs reviewed. Magnesium within normal limits  Hypernatremia -Improved.  Recheck basic metabolic panel in the morning  Hypokalemia -Recheck basic metabolic panel in the morning -Low this AM, will replace -Repeat bmet in AM  Abnormal LFTs -Patient's AST was 5 and ALT was 66 -Possibly in the setting of surgery -Recheck BMET in AM  Hyperbilirubinemia -Mild at  1.3 -Will continue to follow  Thrombocytosis -Likely reactive in the setting of aspiration infection along with UTI -Stable at present -Repeat CBC in AM  DVT prophylaxis: SCDs Code Status: DO NOT RESUSCITATE/DO NOT INTUBATE Family Communication: Patient in room, family not at bedside Disposition Plan: Uncertain at this time  Consultants:   [facility]   Neurology Dr. Cheral Marker  Procedures:   Patient status post lysis of adhesions  Antimicrobials: Anti-infectives (From admission, onward)   Start     Dose/Rate Route Frequency Ordered Stop   09/22/17 1000  cefoTEtan (CEFOTAN) 2 g in sodium chloride 0.9 % 100 mL IVPB     2 g 200 mL/hr over 30 Minutes Intravenous On call to O.R. 09/22/17 0852 09/22/17 1600   09/21/17 1700  vancomycin (VANCOCIN) IVPB 750 mg/150 ml premix  Status:  Discontinued     750 mg 150 mL/hr over 60 Minutes Intravenous Every 12 hours 09/21/17 0956 09/22/17 0959   09/21/17 0600  vancomycin (VANCOCIN) IVPB 1000 mg/200 mL premix  Status:  Discontinued     1,000 mg 200 mL/hr over 60 Minutes Intravenous Every 24 hours 09/20/17 0628 09/20/17 0921   09/21/17 0600  vancomycin (VANCOCIN) IVPB 750 mg/150 ml premix  Status:  Discontinued     750 mg 150 mL/hr over 60 Minutes Intravenous Every 24 hours 09/20/17 0921 09/21/17 0956   09/20/17 0800  piperacillin-tazobactam (ZOSYN) IVPB 3.375 g     3.375 g 12.5 mL/hr over 240 Minutes Intravenous Every 8 hours 09/20/17 0612     09/20/17 0130  vancomycin (VANCOCIN) 1,500 mg in sodium chloride 0.9 % 500 mL IVPB     1,500 mg 250 mL/hr over 120 Minutes Intravenous  Once 09/20/17 0100 09/20/17 0453  09/20/17 0100  piperacillin-tazobactam (ZOSYN) IVPB 3.375 g     3.375 g 100 mL/hr over 30 Minutes Intravenous  Once 09/20/17 0047 09/20/17 0214   09/20/17 0100  vancomycin (VANCOCIN) IVPB 1000 mg/200 mL premix  Status:  Discontinued     1,000 mg 200 mL/hr over 60 Minutes Intravenous  Once 09/20/17 0047  09/20/17 0100      Subjective: Cannot obtain given unresponsiveness  Objective: Vitals:   09/29/17 0401 09/29/17 0411 09/29/17 0755 09/29/17 1137  BP: 140/74  (!) 115/59 105/60  Pulse: 86  75 79  Resp: (!) 22  (!) 22   Temp:   98.1 F (36.7 C) 98.6 F (37 C)  TempSrc:   Axillary Axillary  SpO2: 91%  96% 94%  Weight:  72.8 kg (160 lb 7.9 oz)    Height:        Intake/Output Summary (Last 24 hours) at 09/29/2017 1652 Last data filed at 09/29/2017 1400 Gross per 24 hour  Intake 712.67 ml  Output 550 ml  Net 162.67 ml   Filed Weights   09/26/17 0201 09/27/17 0413 09/29/17 0411  Weight: 70.5 kg (155 lb 6.8 oz) 69.6 kg (153 lb 7 oz) 72.8 kg (160 lb 7.9 oz)    Examination: General exam: unresponsive, laying in bed, in nad Respiratory system: Normal respiratory effort, no wheezing Cardiovascular system: regular rate, s1, s2 Gastrointestinal system: Soft, nondistended, positive BS Central nervous system: CN2-12 grossly intact, strength intact Extremities: Perfused, no clubbing Skin: Normal skin turgor, no notable skin lesions seen Psychiatry: cannot obtain from patient given mental status   Data Reviewed: I have personally reviewed following labs and imaging studies  CBC: Recent Labs  Lab 09/23/17 0343 09/24/17 0513 09/25/17 0758 09/26/17 0259 09/27/17 0312  WBC 12.5* 14.2* 18.1* 17.7* 16.2*  NEUTROABS 11.1*  --   --   --  11.6*  HGB 8.9* 8.8* 9.8* 9.4* 10.0*  HCT 25.2* 26.6* 29.5* 27.9* 30.2*  MCV 92.3 93.7 92.5 91.2 92.6  PLT 260 245 402* 408* 132*   Basic Metabolic Panel: Recent Labs  Lab 09/24/17 0513 09/25/17 0758 09/26/17 0259 09/27/17 0312 09/29/17 0922  NA 143 143 147* 145 146*  K 3.8 3.7 3.3* 3.4* 3.2*  CL 112* 105 103 99* 107  CO2 21* 25 35* 34* 31  GLUCOSE 88 97 101* 101* 150*  BUN 19 19 21* 31* 30*  CREATININE 0.83 0.93 1.03 1.10 0.96  CALCIUM 8.2* 8.4* 8.6* 8.7* 7.9*  MG 1.7 2.1 2.3 2.1 2.3  PHOS  --   --   --  3.8  --    GFR: Estimated  Creatinine Clearance: 75.8 mL/min (by C-G formula based on SCr of 0.96 mg/dL). Liver Function Tests: Recent Labs  Lab 09/27/17 0312  AST 65*  ALT 66*  ALKPHOS 92  BILITOT 1.3*  PROT 6.5  ALBUMIN 2.9*   No results for input(s): LIPASE, AMYLASE in the last 168 hours. Recent Labs  Lab 09/28/17 0526  AMMONIA 68*   Coagulation Profile: No results for input(s): INR, PROTIME in the last 168 hours. Cardiac Enzymes: No results for input(s): CKTOTAL, CKMB, CKMBINDEX, TROPONINI in the last 168 hours. BNP (last 3 results) No results for input(s): PROBNP in the last 8760 hours. HbA1C: No results for input(s): HGBA1C in the last 72 hours. CBG: Recent Labs  Lab 09/28/17 2349 09/29/17 0357 09/29/17 0731 09/29/17 1123 09/29/17 1636  GLUCAP 141* 159* 174* 140* 170*   Lipid Profile: No results for input(s): CHOL, HDL, LDLCALC,  TRIG, CHOLHDL, LDLDIRECT in the last 72 hours. Thyroid Function Tests: No results for input(s): TSH, T4TOTAL, FREET4, T3FREE, THYROIDAB in the last 72 hours. Anemia Panel: No results for input(s): VITAMINB12, FOLATE, FERRITIN, TIBC, IRON, RETICCTPCT in the last 72 hours. Sepsis Labs: No results for input(s): PROCALCITON, LATICACIDVEN in the last 168 hours.  Recent Results (from the past 240 hour(s))  Blood Culture (routine x 2)     Status: None   Collection Time: 09/19/17 11:15 PM  Result Value Ref Range Status   Specimen Description   Final    BLOOD LEFT ARM UPPER Performed at [facility] [address], [place], [place] [address]    Special Requests   Final    BOTTLES DRAWN AEROBIC AND ANAEROBIC Blood Culture adequate volume Performed at [facility] [address], [place], [place] [address]    Culture   Final    NO GROWTH 5 DAYS Performed at [facility], [place] [address], [place], [place] [address]    Report Status 09/25/2017 FINAL  Final  Urine culture     Status: Abnormal   Collection Time: 09/20/17   1:28 AM  Result Value Ref Range Status   Specimen Description   Final    URINE, CATHETERIZED Performed at [facility] [address], [place], [place] [address]    Special Requests   Final    NONE Performed at [facility], [place] [address], [place], [place] [address]    Culture (A)  Final    >=100,000 COLONIES/mL DIPHTHEROIDS(CORYNEBACTERIUM SPECIES)   Report Status 09/21/2017 FINAL  Final  MRSA PCR Screening     Status: Abnormal   Collection Time: 09/20/17 12:37 PM  Result Value Ref Range Status   MRSA by PCR POSITIVE (A) NEGATIVE Final    Comment:        The GeneXpert MRSA Assay (FDA approved for NASAL specimens only), is one component of a comprehensive MRSA colonization surveillance program. It is not intended to diagnose MRSA infection nor to guide or monitor treatment for MRSA infections. RESULT CALLED TO, READ BACK BY AND VERIFIED WITH: Alcide Clever [id number] @ [id number] BY J SCOTTON Performed at [facility] [address] [place], [place] [address]      Radiology Studies: Mr Brain Wo Contrast  Result Date: 09/27/2017 CLINICAL DATA:  Altered level of consciousness. History of schizophrenia, hyperlipidemia. EXAM: MRI HEAD WITHOUT CONTRAST TECHNIQUE: Multiplanar, multiecho pulse sequences of the brain and surrounding structures were obtained without intravenous contrast. COMPARISON:  CT head September 26, 2017 FINDINGS: Nonstandard coil used as patient's head was not accommodated by standard coil. Overall poor signal to noise ratio. Moderately motion degraded examination. INTRACRANIAL CONTENTS: No reduced diffusion to suggest acute ischemia. No susceptibility artifact to suggest hemorrhage. The ventricles and sulci are normal for patient's age. Mild probable chronic small vessel ischemic changes. No suspicious parenchymal signal, or mass effect. No abnormal extra-axial fluid collections. No extra-axial masses. VASCULAR:  Normal major intracranial vascular flow voids present at skull base. SKULL AND UPPER CERVICAL SPINE: No abnormal sellar expansion. No suspicious calvarial bone marrow signal. Craniocervical junction maintained. SINUSES/ORBITS: Small left mastoid effusion. Left maxillary sinusitis, partially characterized.The included ocular globes and orbital contents are non-suspicious. OTHER: None. IMPRESSION: 1. Technically limited examination without acute intracranial process. Electronically Signed   By: Elon Alas M.D.   On: 09/27/2017 19:06    Scheduled Meds: . bisacodyl  10 mg Rectal Daily  .  Chlorhexidine Gluconate Cloth  6 each Topical Q0600  . feeding supplement (PRO-STAT SUGAR FREE 64)  30 mL Per Tube BID  . LORazepam  2 mg Intravenous Q6H  . mouth rinse  15 mL Mouth Rinse BID  . metoprolol tartrate  5 mg Intravenous Q8H  . mupirocin ointment  1 application Nasal BID  . polyethylene glycol  17 g Oral BID  . sorbitol, milk of mag, mineral oil, glycerin (SMOG) enema  960 mL Rectal Once   Continuous Infusions: . chlorproMAZINE (THORAZINE) IV    . famotidine (PEPCID) IV Stopped (09/29/17 1134)  . feeding supplement (OSMOLITE 1.5 CAL) 1,000 mL (09/29/17 0406)  . lacosamide (VIMPAT) IV Stopped (09/29/17 1445)  . levETIRAcetam Stopped (09/29/17 0924)  . methocarbamol (ROBAXIN)  IV Stopped (09/26/17 0030)  . piperacillin-tazobactam (ZOSYN)  IV 3.375 g (09/29/17 1633)  . valproate sodium 500 mg (09/29/17 1441)     LOS: 9 days   Marylu Lund, MD [facility] Pager [phone]  If 7PM-7AM, please contact night-coverage [email or web address] Password [facility] 09/29/2017, 4:52 PM

## 2017-09-29 NOTE — Procedures (Signed)
  Video EEG Monitoring Report     Dates of monitoring:  09/28/17 at 11 23 am - 09/29/17 at 07 33 am  Recording day:    Day 2  EEG Number:    n/a Requesting provider:   [place] Interpreting physician:  Jolene Schimke, MD  CPT:  [phone] ICD-10:  G40.901   Present History: 67 year old male with schizophrenia, dementia and episodes of confusion.  TYPE OF RECORDING: At least 18-channel digital EEG with using a standard international 10-20 placement with additional EEG electrodes, and 1 additional channel dedicated to the EKG, at a sampling rate of at least 256 Hz.   Background activities marked by disorganized delta and theta frequencies at times reaching 5 to 6 cps with some posterior gradient.  More prominent slowing noted across right hemisphere particularly right frontocentral and central parietal cortex at times caring sharply contoured morphology and poorly formed sharp waves.  Frequently background activity is interrupted by buildup of 4 to 5 cps rhythmic synchronized evolving sharp waves distributed broadly between 2 hemispheres however appeared to be slight high amplitude and better developed across the right central parietal and frontal central cortex.  Clinically appear to be some neck rhythmic movements and head movements forward lateralization is difficult to discern.  This pattern most consistent with electrographic seizures and can last for several minutes with subsequent resolution.  This seizures occur throughout the day with last seizure around 1 AM.  Clinical interpretation: This day 2 of intensive EEG monitoring with simultaneous video monitoring is abnormal for several reasons #1 background activity slowing and disorganization suggestive of encephalopathy of nonspecific etiologies.  #2 slightly more prominent slowing across right frontocentral central parietal cortex with some sharp waves in that region suggestive of potentially additional neuronal dysfunction and cortical irritability.   #3 multiple electrographic seizures with subtle clinical accompaniment as discussed above were recorded.  Based on clinical manifestations and EEG pattern seizures most likely from a frontal onset.  Lateralization is less clear right frontocentral or paracentral anterior cortex is likely based on EEG appearance but not definitive.  Clinical correlation is advised.

## 2017-09-29 NOTE — Progress Notes (Signed)
Patient ID: Kevin Humphrey, male   DOB: January 20, 1951, 67 y.o.   MRN: [id number]    7 Days Post-Op  Subjective: Patient sedated secondary to epilepticus.  Cortrak in place and tolerated TFs.  Objective: Vital signs in last 24 hours: Temp:  [97.5 F (36.4 C)-99.2 F (37.3 C)] 98.1 F (36.7 C) (06/08 0755) Pulse Rate:  [70-86] 75 (06/08 0755) Resp:  [16-22] 22 (06/08 0755) BP: (115-173)/(59-96) 115/59 (06/08 0755) SpO2:  [91 %-100 %] 96 % (06/08 0755) Weight:  [72.8 kg (160 lb 7.9 oz)] 72.8 kg (160 lb 7.9 oz) (06/08 0411) Last BM Date: 09/28/17  Intake/Output from previous day: 06/07 0701 - 06/08 0700 In: 917.7 [NG/GT:452.7; IV Piggyback:465] Out: 550 [Urine:550] Intake/Output this shift: No intake/output data recorded.  PE: Abd: soft, ND, +BS, Cortrak in place and seems to be tolerating his TFs  Lab Results:  Recent Labs    09/27/17 0312  WBC 16.2*  HGB 10.0*  HCT 30.2*  PLT 476*   BMET Recent Labs    09/27/17 0312  NA 145  K 3.4*  CL 99*  CO2 34*  GLUCOSE 101*  BUN 31*  CREATININE 1.10  CALCIUM 8.7*   PT/INR No results for input(s): LABPROT, INR in the last 72 hours. CMP     Component Value Date/Time   NA 145 09/27/2017 0312   NA 137 08/27/2015   K 3.4 (L) 09/27/2017 0312   CL 99 (L) 09/27/2017 0312   CO2 34 (H) 09/27/2017 0312   GLUCOSE 101 (H) 09/27/2017 0312   BUN 31 (H) 09/27/2017 0312   BUN 16 08/27/2015   CREATININE 1.10 09/27/2017 0312   CALCIUM 8.7 (L) 09/27/2017 0312   PROT 6.5 09/27/2017 0312   ALBUMIN 2.9 (L) 09/27/2017 0312   AST 65 (H) 09/27/2017 0312   ALT 66 (H) 09/27/2017 0312   ALKPHOS 92 09/27/2017 0312   BILITOT 1.3 (H) 09/27/2017 0312   GFRNONAA >60 09/27/2017 0312   GFRAA >60 09/27/2017 0312   Lipase     Component Value Date/Time   LIPASE 19 09/19/2017 2324       Studies/Results: Mr Brain Wo Contrast  Result Date: 09/27/2017 CLINICAL DATA:  Altered level of consciousness. History of schizophrenia, hyperlipidemia.  EXAM: MRI HEAD WITHOUT CONTRAST TECHNIQUE: Multiplanar, multiecho pulse sequences of the brain and surrounding structures were obtained without intravenous contrast. COMPARISON:  CT head September 26, 2017 FINDINGS: Nonstandard coil used as patient's head was not accommodated by standard coil. Overall poor signal to noise ratio. Moderately motion degraded examination. INTRACRANIAL CONTENTS: No reduced diffusion to suggest acute ischemia. No susceptibility artifact to suggest hemorrhage. The ventricles and sulci are normal for patient's age. Mild probable chronic small vessel ischemic changes. No suspicious parenchymal signal, or mass effect. No abnormal extra-axial fluid collections. No extra-axial masses. VASCULAR: Normal major intracranial vascular flow voids present at skull base. SKULL AND UPPER CERVICAL SPINE: No abnormal sellar expansion. No suspicious calvarial bone marrow signal. Craniocervical junction maintained. SINUSES/ORBITS: Small left mastoid effusion. Left maxillary sinusitis, partially characterized.The included ocular globes and orbital contents are non-suspicious. OTHER: None. IMPRESSION: 1. Technically limited examination without acute intracranial process. Electronically Signed   By: Elon Alas M.D.   On: 09/27/2017 19:06    Anti-infectives: Anti-infectives (From admission, onward)   Start     Dose/Rate Route Frequency Ordered Stop   09/22/17 1000  cefoTEtan (CEFOTAN) 2 g in sodium chloride 0.9 % 100 mL IVPB     2 g 200 mL/hr over 30  Minutes Intravenous On call to O.R. 09/22/17 0852 09/22/17 1600   09/21/17 1700  vancomycin (VANCOCIN) IVPB 750 mg/150 ml premix  Status:  Discontinued     750 mg 150 mL/hr over 60 Minutes Intravenous Every 12 hours 09/21/17 0956 09/22/17 0959   09/21/17 0600  vancomycin (VANCOCIN) IVPB 1000 mg/200 mL premix  Status:  Discontinued     1,000 mg 200 mL/hr over 60 Minutes Intravenous Every 24 hours 09/20/17 0628 09/20/17 0921   09/21/17 0600  vancomycin  (VANCOCIN) IVPB 750 mg/150 ml premix  Status:  Discontinued     750 mg 150 mL/hr over 60 Minutes Intravenous Every 24 hours 09/20/17 0921 09/21/17 0956   09/20/17 0800  piperacillin-tazobactam (ZOSYN) IVPB 3.375 g     3.375 g 12.5 mL/hr over 240 Minutes Intravenous Every 8 hours 09/20/17 0612     09/20/17 0130  vancomycin (VANCOCIN) 1,500 mg in sodium chloride 0.9 % 500 mL IVPB     1,500 mg 250 mL/hr over 120 Minutes Intravenous  Once 09/20/17 0100 09/20/17 0453   09/20/17 0100  piperacillin-tazobactam (ZOSYN) IVPB 3.375 g     3.375 g 100 mL/hr over 30 Minutes Intravenous  Once 09/20/17 0047 09/20/17 0214   09/20/17 0100  vancomycin (VANCOCIN) IVPB 1000 mg/200 mL premix  Status:  Discontinued     1,000 mg 200 mL/hr over 60 Minutes Intravenous  Once 09/20/17 0047 09/20/17 0100       Assessment/Plan ARF Aspiration PNA UTI Anemia, normocytic - no overt bleeding. follow H&H, hgb 8.8 from 8.9; on iron per primary team  HTN Schizophrenia  Dementia  Malnutrition  AMS - per medical service  SBO 2/2 adehesions from previous laparotomy S/PLAPAROSCOPICLYSIS OF ADHESIONS6/1/19 [name]  -Cortrak in place and tolerating TFs.  FEN:TFs  ID: Zosyn (UTI/PNA) 5/30>>, WBC16.2 from 17.7 VTE: SCD's Foley: yes Follow up: Dr. Michael Boston  Plan: pt is surgically stable.  Cont TFs while sedated.    LOS: 9 days    Henreitta Cea , [facility] Surgery 09/29/2017, 9:21 AM Pager: [phone]

## 2017-09-30 ENCOUNTER — Encounter (HOSPITAL_COMMUNITY): Payer: Self-pay

## 2017-09-30 LAB — GLUCOSE, CAPILLARY
GLUCOSE-CAPILLARY: 112 mg/dL — AB (ref 65–99)
GLUCOSE-CAPILLARY: 129 mg/dL — AB (ref 65–99)
GLUCOSE-CAPILLARY: 137 mg/dL — AB (ref 65–99)
Glucose-Capillary: 128 mg/dL — ABNORMAL HIGH (ref 65–99)
Glucose-Capillary: 141 mg/dL — ABNORMAL HIGH (ref 65–99)
Glucose-Capillary: 134 mg/dL — ABNORMAL HIGH (ref 65–99)
Glucose-Capillary: 101 mg/dL — ABNORMAL HIGH (ref 65–99)

## 2017-09-30 LAB — COMPREHENSIVE METABOLIC PANEL
ALK PHOS: 84 U/L (ref 38–126)
ALT: 44 U/L (ref 17–63)
AST: 30 U/L (ref 15–41)
Albumin: 2.4 g/dL — ABNORMAL LOW (ref 3.5–5.0)
Anion gap: 8 (ref 5–15)
BUN: 25 mg/dL — AB (ref 6–20)
CALCIUM: 7.8 mg/dL — AB (ref 8.9–10.3)
CO2: 29 mmol/L (ref 22–32)
Chloride: 111 mmol/L (ref 101–111)
Creatinine, Ser: 0.77 mg/dL (ref 0.61–1.24)
Glucose, Bld: 137 mg/dL — ABNORMAL HIGH (ref 65–99)
Potassium: 3.4 mmol/L — ABNORMAL LOW (ref 3.5–5.1)
Sodium: 148 mmol/L — ABNORMAL HIGH (ref 135–145)
Total Bilirubin: 0.4 mg/dL (ref 0.3–1.2)
Total Protein: 5.4 g/dL — ABNORMAL LOW (ref 6.5–8.1)

## 2017-09-30 MED ORDER — VALPROATE SODIUM 500 MG/5ML IV SOLN
750.0000 mg | Freq: Three times a day (TID) | INTRAVENOUS | Status: DC
  Administered 2017-09-30 – 2017-10-02 (×5): 750 mg via INTRAVENOUS
  Filled 2017-09-30 (×6): qty 7.5

## 2017-09-30 MED ORDER — FUROSEMIDE 10 MG/ML IJ SOLN
40.0000 mg | Freq: Once | INTRAMUSCULAR | Status: AC
  Administered 2017-09-30: 40 mg via INTRAVENOUS
  Filled 2017-09-30: qty 4

## 2017-09-30 MED ORDER — LORAZEPAM 2 MG/ML IJ SOLN
2.0000 mg | Freq: Once | INTRAMUSCULAR | Status: AC
  Administered 2017-09-30: 2 mg via INTRAVENOUS
  Filled 2017-09-30: qty 1

## 2017-09-30 MED ORDER — DEXTROSE 5 % IV SOLN
750.0000 mg | Freq: Once | INTRAVENOUS | Status: AC
  Administered 2017-09-30: 750 mg via INTRAVENOUS
  Filled 2017-09-30: qty 7.5

## 2017-09-30 NOTE — Progress Notes (Signed)
PROGRESS NOTE    Kevin Humphrey  YIR:[id number] DOB: Jan 04, 1951 DOA: 09/19/2017 PCP: [name], MD    Brief Narrative:  67 y.o.malewithhistory of advanced dementia, schizophrenia, hypertension was brought from the skilled nursing facility after patient was found to have persistent nausea vomiting with hematemesis. Chest x-ray done at this nursing facility was showing some infiltrates and abdomen x-ray was showing concerning for bowel obstruction.  In the ER stool for occult blood was positive. Abdomen was distended and initially patient on arrival was confused and minimally responsive but somewhat improved after starting antibiotics and fluids. CT abdomen pelvis shows bowel obstruction with transition point. Chest x-ray was showing possibility of infiltrates. Patient was hypotensive improved with fluids and empiric antibiotic started for sepsis likely from aspiration and found to have a UTI as well.   Underwent Laparoscopic Lysis of Adhesions for SBO and improved but continued to be Encephalopathic and not following commands. Because of his AMS an MRI and EEG were ordered and Neurology was consulted to evaluate and make recommendations  Assessment & Plan:   Principal Problem:   SBO (small bowel obstruction) s/p lap adhesiolysis 09/22/2017 Active Problems:   Schizophrenia ([place])   Malnutrition of moderate degree (HCC)   Acute respiratory failure with hypoxia ([place])   ARF (acute renal failure) (HCC)   Hematemesis   Sepsis ([place])   Anxiety state   Small bowel obstruction (HCC)   Hypomagnesemia   Acute lower UTI   Acute metabolic encephalopathy   Ileus (HCC)   Volume overload   Hypokalemia  High-grade small bowel obstruction that is post laparoscopic lysis of adhesions 09/22/2017 with subsequent Ileus -With small bowel obstruction secondary to adhesions.  -Patient status post laparoscopic lysis of adhesions 09/22/2017 prior [name].  -Patient had ileus with no  bowel movements and no flatus and hypoactive bowel sounds as of 09/23/2017.  -Patient initially started on clears with plans to advance diet as tolerated -Patient noted to have concerns for depressed consciousness and even seizure activity.  Patient subsequent transferred from [facility] long hospital to [facility].  See below -Keep magnesium greater than 2. Keep potassium greater than 4.  -Continue to replace potassium as needed  Acute respiratory failure with hypoxia secondary to volume overload -Patient noted to have worsening shortness of breaththe night of 09/23/2017 to the morning of 09/24/2017,with increased O2 requirements requiring 8 L of high flow nasal cannula.  -Chest x-ray obtained consistent with volume overload.  -Urine output not accurately recorded.   -Patient had been continued on IV Lasix, now held. -We will continue to monitor ins and outs closely.  Acute Metabolic Encephalopathy in the setting of Advanced Dementia -Suspicion for medication induced cephalopathy. -Obtain Head CT w/o Contrast: Showed No acute intracranial abnormality. Mild cerebral atrophy. Chronic sinusitis. -Appreciate input by palliative care service.  Overall prognosis seems to be poor at this time. -Continue NG tube placed for tube feeds.  -Concerns for possible continued seizure activity. Discussed with Neurology. AEDs titrated by Neurology. Continue to monitor.  Initial plans for burst suppression in ICU, reviewed neurology recommendations.  Plan to continue to monitor for now  Acute Renal Failure -Likely secondary to prerenal azotemia in the setting of ACE inhibitor.  -Patient noted on admission to be hypotensive.  -Patient hydrated aggressively with IV fluids. Renal function improved and currently at baseline noted. -Patient now noted to be in volume overload. Patient was continued on IV Lasix and has now been D/C'd.  -Recheck basic metabolic panel in  the morning  Sepsis likely  secondary to aspiration pneumonia and Diphtheroids UTI -Per CT abdomen and pelvis as well as chest x-ray concerning for probable pneumonia.  -Patient on admission noted to be septic with hypotension, acute renal failure, tachycardia, elevated lactic acid level, elevated procalcitonin level.  -Lactic acid level and pro calcitonin levels trending down.  -Urine cultures with >100000 colonies of Diphtheroids.  -Blood cultures pending with no growth to date. IV vancomycin was discontinued.  -completed IV Zosyn -Continue to follow CBC  Anemia/Hematemesis -Likely dilutional in nature.  -Patient on admission was noted to have some hematemesis however no hematemesis noted during the hospitalization.  -Patient with no overt bleeding.  -Anemia panel has been checked and iron level at 11. Ferritin at 142. Vitamin B12 levels at 282.  -Continue vitamin B12 IM injections.  -Hb/Hct now stable at 10.0/30.2 -Patient s/pIV Feraheme.  -Follow H&H, transfusion threshold hemoglobin less than 8.  -Will likely need oral iron supplementation on discharge.  -Continue PPI as tolerated -Continue to follow CBC profile  Hypertension -On admission patient noted to be septic with hypotension.  -Blood pressure improved. BP elevated.  -Antihypertensive medications on hold secondary to problem #1.  -Continue IV Lopressor and IV Hydralazine.  -Diuretics were discontinued as noted above.  Schizophrenia -Continue IV Depakote 250 mg q8h.  -Patient poorly responsive.  Hypomagnesemia -Continue to Monitor and Replete as Necessary -Labs reviewed. Magnesium within normal limits  Hypernatremia -Improved. -Continue to follow basic metabolic panel  Hypokalemia -Recheck basic metabolic panel in the morning -Low this AM, will replace -Check basic metabolic panel in morning  Abnormal LFTs -Patient's AST was 5 and ALT was 66 -Possibly in the setting of surgery  Hyperbilirubinemia -Mild  at 1.3 -Will continue to follow for now  Thrombocytosis -Likely reactive in the setting of aspiration infection along with UTI -Stable at present -Check CBC in AM  DVT prophylaxis: SCDs Code Status: DO NOT RESUSCITATE/DO NOT INTUBATE Family Communication: Patient in room, family not at bedside Disposition Plan: Uncertain at this time  Consultants:   [place]   Neurology Dr. Cheral Marker  Procedures:   Patient status post lysis of adhesions  Antimicrobials: Anti-infectives (From admission, onward)   Start     Dose/Rate Route Frequency Ordered Stop   09/22/17 1000  cefoTEtan (CEFOTAN) 2 g in sodium chloride 0.9 % 100 mL IVPB     2 g 200 mL/hr over 30 Minutes Intravenous On call to O.R. 09/22/17 0852 09/22/17 1600   09/21/17 1700  vancomycin (VANCOCIN) IVPB 750 mg/150 ml premix  Status:  Discontinued     750 mg 150 mL/hr over 60 Minutes Intravenous Every 12 hours 09/21/17 0956 09/22/17 0959   09/21/17 0600  vancomycin (VANCOCIN) IVPB 1000 mg/200 mL premix  Status:  Discontinued     1,000 mg 200 mL/hr over 60 Minutes Intravenous Every 24 hours 09/20/17 0628 09/20/17 0921   09/21/17 0600  vancomycin (VANCOCIN) IVPB 750 mg/150 ml premix  Status:  Discontinued     750 mg 150 mL/hr over 60 Minutes Intravenous Every 24 hours 09/20/17 0921 09/21/17 0956   09/20/17 0800  piperacillin-tazobactam (ZOSYN) IVPB 3.375 g  Status:  Discontinued     3.375 g 12.5 mL/hr over 240 Minutes Intravenous Every 8 hours 09/20/17 0612 09/29/17 1708   09/20/17 0130  vancomycin (VANCOCIN) 1,500 mg in sodium chloride 0.9 % 500 mL IVPB     1,500 mg 250 mL/hr over 120 Minutes Intravenous  Once 09/20/17 0100 09/20/17  9371   09/20/17 0100  piperacillin-tazobactam (ZOSYN) IVPB 3.375 g     3.375 g 100 mL/hr over 30 Minutes Intravenous  Once 09/20/17 0047 09/20/17 0214   09/20/17 0100  vancomycin (VANCOCIN) IVPB 1000 mg/200 mL premix  Status:  Discontinued     1,000 mg 200 mL/hr over  60 Minutes Intravenous  Once 09/20/17 0047 09/20/17 0100      Subjective: Unable to obtain given unresponsiveness  Objective: Vitals:   09/30/17 0527 09/30/17 0614 09/30/17 0843 09/30/17 1728  BP:  (!) 150/79 129/63 (!) 157/87  Pulse:   79 85  Resp:   16 16  Temp:   97.7 F (36.5 C) (!) 97.4 F (36.3 C)  TempSrc:   Oral Oral  SpO2: 98% 93% 94% 95%  Weight:      Height:        Intake/Output Summary (Last 24 hours) at 09/30/2017 1816 Last data filed at 09/30/2017 1700 Gross per 24 hour  Intake 2014 ml  Output 450 ml  Net 1564 ml   Filed Weights   09/27/17 0413 09/29/17 0411 09/30/17 0447  Weight: 69.6 kg (153 lb 7 oz) 72.8 kg (160 lb 7.9 oz) 75.5 kg (166 lb 7.2 oz)    Examination: General exam: Remains unresponsive, in no acute distress Respiratory system: normal chest rise, clear, no audible wheezing Cardiovascular system: regular rhythm, s1-s2 Gastrointestinal system: Nondistended, nontender, pos BS Central nervous system: No seizures, no tremors Extremities: No cyanosis, no joint deformities Skin: No rashes, no pallor Psychiatry: Not obtained as patient remains unresponsive  Data Reviewed: I have personally reviewed following labs and imaging studies  CBC: Recent Labs  Lab 09/24/17 0513 09/25/17 0758 09/26/17 0259 09/27/17 0312  WBC 14.2* 18.1* 17.7* 16.2*  NEUTROABS  --   --   --  11.6*  HGB 8.8* 9.8* 9.4* 10.0*  HCT 26.6* 29.5* 27.9* 30.2*  MCV 93.7 92.5 91.2 92.6  PLT 245 402* 408* 696*   Basic Metabolic Panel: Recent Labs  Lab 09/24/17 0513 09/25/17 0758 09/26/17 0259 09/27/17 0312 09/29/17 0922 09/30/17 1007  NA 143 143 147* 145 146* 148*  K 3.8 3.7 3.3* 3.4* 3.2* 3.4*  CL 112* 105 103 99* 107 111  CO2 21* 25 35* 34* 31 29  GLUCOSE 88 97 101* 101* 150* 137*  BUN 19 19 21* 31* 30* 25*  CREATININE 0.83 0.93 1.03 1.10 0.96 0.77  CALCIUM 8.2* 8.4* 8.6* 8.7* 7.9* 7.8*  MG 1.7 2.1 2.3 2.1 2.3  --   PHOS  --   --   --  3.8  --   --     GFR: Estimated Creatinine Clearance: 94.1 mL/min (by C-G formula based on SCr of 0.77 mg/dL). Liver Function Tests: Recent Labs  Lab 09/27/17 0312 09/30/17 1007  AST 65* 30  ALT 66* 44  ALKPHOS 92 84  BILITOT 1.3* 0.4  PROT 6.5 5.4*  ALBUMIN 2.9* 2.4*   No results for input(s): LIPASE, AMYLASE in the last 168 hours. Recent Labs  Lab 09/28/17 0526  AMMONIA 68*   Coagulation Profile: No results for input(s): INR, PROTIME in the last 168 hours. Cardiac Enzymes: No results for input(s): CKTOTAL, CKMB, CKMBINDEX, TROPONINI in the last 168 hours. BNP (last 3 results) No results for input(s): PROBNP in the last 8760 hours. HbA1C: No results for input(s): HGBA1C in the last 72 hours. CBG: Recent Labs  Lab 09/30/17 0033 09/30/17 0357 09/30/17 0753 09/30/17 1247 09/30/17 1640  GLUCAP 137* 128* 141* 129* 134*  Lipid Profile: No results for input(s): CHOL, HDL, LDLCALC, TRIG, CHOLHDL, LDLDIRECT in the last 72 hours. Thyroid Function Tests: No results for input(s): TSH, T4TOTAL, FREET4, T3FREE, THYROIDAB in the last 72 hours. Anemia Panel: No results for input(s): VITAMINB12, FOLATE, FERRITIN, TIBC, IRON, RETICCTPCT in the last 72 hours. Sepsis Labs: No results for input(s): PROCALCITON, LATICACIDVEN in the last 168 hours.  No results found for this or any previous visit (from the past 240 hour(s)).   Radiology Studies: No results found.  Scheduled Meds: . bisacodyl  10 mg Rectal Daily  . Chlorhexidine Gluconate Cloth  6 each Topical Q0600  . feeding supplement (PRO-STAT SUGAR FREE 64)  30 mL Per Tube BID  . LORazepam  2 mg Intravenous Q6H  . mouth rinse  15 mL Mouth Rinse BID  . metoprolol tartrate  5 mg Intravenous Q8H  . mupirocin ointment  1 application Nasal BID  . polyethylene glycol  17 g Oral BID  . sorbitol, milk of mag, mineral oil, glycerin (SMOG) enema  960 mL Rectal Once   Continuous Infusions: . chlorproMAZINE (THORAZINE) IV    . famotidine  (PEPCID) IV Stopped (09/30/17 1030)  . feeding supplement (OSMOLITE 1.5 CAL) 50 mL/hr at 09/29/17 2330  . lacosamide (VIMPAT) IV Stopped (09/30/17 1030)  . levETIRAcetam Stopped (09/30/17 0719)  . methocarbamol (ROBAXIN)  IV Stopped (09/26/17 0030)  . valproate sodium       LOS: 10 days   Marylu Lund, MD [facility] Pager [phone]  If 7PM-7AM, please contact night-coverage [email or web address] Password [facility] 09/30/2017, 6:16 PM

## 2017-09-30 NOTE — Progress Notes (Signed)
LTM EEG checked, all imp below 10K. No skin breakdown seen at Fp1, Fp2, F3 or F4. Paste added to F4 and P4

## 2017-09-30 NOTE — Progress Notes (Addendum)
Subjective:  Lying in bed obtunded and unresponsive to commands or harsh stimuli. Patient was rattling, and only able to cough up copious amount of sputum after deep suctioning. There was an episode of forced right lower gaze deviation, without other physical signs of seizure.  Objective: Current vital signs: BP 129/63 (BP Location: Left Arm)   Pulse 79   Temp 97.7 F (36.5 C) (Oral)   Resp 16   Ht 5\' 11"  (1.803 m)   Wt 75.5 kg (166 lb 7.2 oz)   SpO2 94%   BMI 23.21 kg/m  Vital signs in last 24 hours: Temp:  [97.5 F (36.4 C)-98.7 F (37.1 C)] 97.7 F (36.5 C) (06/09 0843) Pulse Rate:  [70-84] 79 (06/09 0843) Resp:  [16-20] 16 (06/09 0843) BP: (105-170)/(60-79) 129/63 (06/09 0843) SpO2:  [92 %-100 %] 94 % (06/09 0843) Weight:  [75.5 kg (166 lb 7.2 oz)] 75.5 kg (166 lb 7.2 oz) (06/09 0447)  Intake/Output from previous day: 06/08 0701 - 06/09 0700 In: 1125 [NG/GT:370; IV Piggyback:755] Out: 750 [Urine:750] Intake/Output this shift: No intake/output data recorded. Nutritional status:  Diet Order           Diet NPO time specified  Diet effective now        Diet - low sodium heart healthy         Physical Exam: Resp: Patient was rattling with coarse rhonchi, despite using oxygen, O2 satts were 88%  Neurologic Exam: Mentation: Continues to be lethargic, obtunded, not easily arouable and not following commands.   CN: Eyes were closed initially. When passively opened, eyes were noted to be deviated to the right as patient was lying on the right side. Pupils were wide at 5 mm and sluggishly constricting to 3-4 mm  Motor/Sensory: No spontaneous movement. Localizes to noxious stimuli in all 4 extremities equally.  Not responding to sternal rubs, severely increased tone in all 4 extremities, consistent with rigidity.   Reflexes: Patellar reflexes muted  Lab Results:   Recent Results (from the past 240 hour(s))  MRSA PCR Screening     Status: Abnormal   Collection Time:  09/20/17 12:37 PM  Result Value Ref Range Status   MRSA by PCR POSITIVE (A) NEGATIVE Final    Comment:        The GeneXpert MRSA Assay (FDA approved for NASAL specimens only), is one component of a comprehensive MRSA colonization surveillance program. It is not intended to diagnose MRSA infection nor to guide or monitor treatment for MRSA infections. RESULT CALLED TO, READ BACK BY AND VERIFIED WITH: Alcide Clever [id number] @ 2585 BY J SCOTTON Performed at [facility] [address], [place], [place] [address]      Studies/Results: Mr Brain Wo Contrast 09/27/2017 IMPRESSION:  1. Technically limited examination without acute intracranial process.    Medications:  Scheduled: . bisacodyl  10 mg Rectal Daily  . Chlorhexidine Gluconate Cloth  6 each Topical Q0600  . feeding supplement (PRO-STAT SUGAR FREE 64)  30 mL Per Tube BID  . LORazepam  2 mg Intravenous Q6H  . LORazepam  2 mg Intravenous Once  . mouth rinse  15 mL Mouth Rinse BID  . metoprolol tartrate  5 mg Intravenous Q8H  . mupirocin ointment  1 application Nasal BID  . polyethylene glycol  17 g Oral BID  . sorbitol, milk of mag, mineral oil, glycerin (SMOG) enema  960 mL Rectal Once   Continuous: . chlorproMAZINE (THORAZINE) IV    . famotidine (  PEPCID) IV 20 mg (09/30/17 0954)  . feeding supplement (OSMOLITE 1.5 CAL) 50 mL/hr at 09/29/17 2330  . lacosamide (VIMPAT) IV 200 mg (09/30/17 0954)  . levETIRAcetam Stopped (09/30/17 0719)  . methocarbamol (ROBAXIN)  IV Stopped (09/26/17 0030)  . valproate sodium Stopped (09/30/17 0719)     LTM EEG 09/30/17 Clinical interpretation: Day 3: Background activities  are marked by 4-5 cps broadly distributed rhythm . Superimposed right anterior frontal and  centro parietal delta slowing noted, occasionally right CP and right frontopolar  sharp waves present. No definitive seizures present. Clinical interpretation: This day 3 of intensive EEG monitoring with  simultaneous video monitoring did not demonstrate any definitive seizures. EEG  is abnormal for several reasons #1 background activity slowing and disorganization suggestive of encephalopathy of nonspecific etiologies.  #2 slightly more prominent slowing across right fronto central,  central parietal cortex with some sharp waves in that region suggestive of potentially additional neuronal dysfunction and cortical irritability.   Assessment: 67 year old male with schizophrenia, dementia and episodes of confusion. Neurology was consulted for AMS after patient did not return to baseline since his surgery on 6/1.  1. EEG performed 6/6 revealed a 5 minute seizure, which was concerning for NCSE. He was loaded with 1500 mg Keppra and transferred to [facility] for EEG monitoring. Review of LTM EEG at 6/7 at 6 AM revealed 3-4 runs of rhythmic paroxysmal fast activity in bilateral frontal and parietotemporal leads consistent with seizures lasting several minutes, most recent and longest one starting at 5:41 am lasting for about 15 min consistent with non convulsive status epilepticus. He was then loaded with an additional 1500 mg of Keppra with maintenance dose increased to 1500 mg BID.  LTM overnight into yesterday, 09/29/2017, showed patient having multiple electrographic seizures throughout the day with a last seizure around 1am on 6/8.  Vimpat and scheduled Ativan were then initiated on 6/8. LTM monitoring overnight on 6/9 was improved, showed no definitive seizures but continued to have abnormal findings with background slowing and disorganization suggestive of encephalopathy of nonspecific etiologies as well as some sharp waves in the right frontal and parietal cortex potentially indicating cortical irritability.  Today, patient had forced right lower gaze deviation, but no physical signs of seizure.   Discussed with IMS Dr. Wyline Copas, regarding transferring to ICU for possible burst suppression protocol; this will require  intubation and consent will have to be obtained from health care power of attorney (the patient is apparently with no close family available and is a ward of the state). Per Dr. Wyline Copas, the patient is DNR, and power of attorney recommends treating only if neurological deficits are potentially significantly reversible.  Seizure may be reversible with burst suppression but very concerned that if patient is intubated, he will be difficult to subsequently extubate. 2. VPA level 55 this am (6/9). One time supplemental valproic acid load of 10 mg/kg administered with scheduled dose increased to 750mg  3 times daily   Repeat VPA level tomorrow am.  Recommendations: 1. Increased dose of  valproic acid to 750 mg IV TID 2. Continue Keppra at increased dose of 1500 mg IV BID 3. Cotninue Vimpat 200 mcg IV twice daily 4. Cotninue lorazepam 2 mg IV every 6 hours 5. Continue LTM EEG for another 24 hours with new med changes 4. LTM EEG  concerning for recurrence of NCSE yesterday, but noted to have possible clinical seizure with forced right gaze deviation this morning. He was given an extra dose of Ativan,  with supplemental IV load of VPA and increase in scheduled VPA dosing as per #1. Will hold off on burst suppression for now as EEG tracings overall improved today compared to previous days' tracings.     LOS: 10 days   Carney Bern DNP 10:54 AM 09/29/2017  Electronically signed: Dr. Kerney Elbe

## 2017-09-30 NOTE — Procedures (Signed)
  Video EEG Monitoring Report     Dates of monitoring:  09/29/17 at 07 33  am - 09/30/17 at 07 33 am  Recording day:    Day 3 EEG Number:    n/a Requesting provider:   [place] Interpreting physician:  Jolene Schimke, MD  CPT:  [phone] ICD-10:  G52.901   Present History: 67 year old male with schizophrenia, dementia and episodes of confusion.  TYPE OF RECORDING: At least 18-channel digital EEG with using a standard international 10-20 placement with additional EEG electrodes, and 1 additional channel dedicated to the EKG, at a sampling rate of at least 256 Hz.   Day 2: Background activities marked by disorganized delta and theta frequencies at times reaching 5 to 6 cps with some posterior gradient.  More prominent slowing noted across right hemisphere particularly right frontocentral and central parietal cortex at times caring sharply contoured morphology and poorly formed sharp waves.  Frequently background activity is interrupted by buildup of 4 to 5 cps rhythmic synchronized evolving sharp waves distributed broadly between 2 hemispheres however appeared to be slight high amplitude and better developed across the right central parietal and frontal central cortex.  Clinically appear to be some neck rhythmic movements and head movements forward lateralization is difficult to discern.  This pattern most consistent with electrographic seizures and can last for several minutes with subsequent resolution.  This seizures occur throughout the day with last seizure around 1 AM.  Day 3: Background activities  are marked by 4-5 cps broadly distributed rhythm . Superimposed right anterior frontal and  centro parietal delta slowing noted, occasionally right CP and right frontopolar  sharp waves present. No definitive seizures present.   Clinical interpretation: This day 3 of intensive EEG monitoring with simultaneous video monitoring did not demonstrate any definitive seizures. EEG  is abnormal for several reasons  #1 background activity slowing and disorganization suggestive of encephalopathy of nonspecific etiologies.  #2 slightly more prominent slowing across right fronto central,  central parietal cortex with some sharp waves in that region suggestive of potentially additional neuronal dysfunction and cortical irritability.

## 2017-09-30 NOTE — Progress Notes (Signed)
Messaged Dr. Silas [place] per assessment, asking for Lasix if possible, BP WNL

## 2017-10-01 DIAGNOSIS — G9341 Metabolic encephalopathy: Secondary | ICD-10-CM

## 2017-10-01 LAB — URINALYSIS, ROUTINE W REFLEX MICROSCOPIC
Bilirubin Urine: NEGATIVE
Glucose, UA: NEGATIVE mg/dL
Ketones, ur: NEGATIVE mg/dL
LEUKOCYTES UA: NEGATIVE
Nitrite: NEGATIVE
Protein, ur: 30 mg/dL — AB
SPECIFIC GRAVITY, URINE: 1.02 (ref 1.005–1.030)
pH: 9 — ABNORMAL HIGH (ref 5.0–8.0)

## 2017-10-01 LAB — BLOOD GAS, ARTERIAL
Acid-Base Excess: 9 mmol/L — ABNORMAL HIGH (ref 0.0–2.0)
Bicarbonate: 32.8 mmol/L — ABNORMAL HIGH (ref 20.0–28.0)
Drawn by: 24610
O2 CONTENT: 2 L/min
O2 SAT: 91.6 %
PCO2 ART: 43.2 mmHg (ref 32.0–48.0)
Patient temperature: 98.6
pH, Arterial: 7.494 — ABNORMAL HIGH (ref 7.350–7.450)
pO2, Arterial: 59.3 mmHg — ABNORMAL LOW (ref 83.0–108.0)

## 2017-10-01 LAB — BASIC METABOLIC PANEL
Anion gap: 10 (ref 5–15)
BUN: 26 mg/dL — AB (ref 6–20)
CALCIUM: 8.3 mg/dL — AB (ref 8.9–10.3)
CHLORIDE: 109 mmol/L (ref 101–111)
CO2: 31 mmol/L (ref 22–32)
CREATININE: 0.84 mg/dL (ref 0.61–1.24)
GFR calc Af Amer: >60 mL/min (ref >60)
GFR calc non Af Amer: >60 mL/min (ref >60)
GLUCOSE: 104 mg/dL — AB (ref 65–99)
Potassium: 3.3 mmol/L — ABNORMAL LOW (ref 3.5–5.1)
Sodium: 150 mmol/L — ABNORMAL HIGH (ref 135–145)

## 2017-10-01 LAB — GLUCOSE, CAPILLARY
GLUCOSE-CAPILLARY: 135 mg/dL — AB (ref 65–99)
GLUCOSE-CAPILLARY: 110 mg/dL — AB (ref 65–99)
Glucose-Capillary: 90 mg/dL (ref 65–99)
Glucose-Capillary: 123 mg/dL — ABNORMAL HIGH (ref 65–99)
Glucose-Capillary: 121 mg/dL — ABNORMAL HIGH (ref 65–99)

## 2017-10-01 LAB — TSH: TSH: 1.786 u[IU]/mL (ref 0.350–4.500)

## 2017-10-01 LAB — AMMONIA: AMMONIA: 176 umol/L — AB (ref 9–35)

## 2017-10-01 LAB — VITAMIN B12: VITAMIN B 12: 1085 pg/mL — AB (ref 180–914)

## 2017-10-01 MED ORDER — FREE WATER
100.0000 mL | Freq: Three times a day (TID) | Status: DC
  Administered 2017-10-01 – 2017-10-02 (×3): 100 mL

## 2017-10-01 MED ORDER — POTASSIUM CHLORIDE 20 MEQ/15ML (10%) PO SOLN
40.0000 meq | Freq: Once | ORAL | Status: AC
  Administered 2017-10-01: 40 meq
  Filled 2017-10-01: qty 30

## 2017-10-01 MED ORDER — LACTULOSE 10 GM/15ML PO SOLN
30.0000 g | Freq: Two times a day (BID) | ORAL | Status: DC
  Administered 2017-10-01 – 2017-10-02 (×2): 30 g
  Filled 2017-10-01 (×2): qty 60

## 2017-10-01 NOTE — Progress Notes (Signed)
Paged IM  MD: Phleb here in room and drew BMP for this am, also drew CBC in case.   do you want to add on CBC? if so please order   Thanks

## 2017-10-01 NOTE — Progress Notes (Signed)
Physical Therapy Treatment Patient Details Name: Kevin Humphrey MRN: [id number] DOB: 02/24/1951 Today's Date: 10/01/2017    History of Present Illness 67 y.o.malewithhistory of advanced dementia, schizophrenia, hypertension was brought from the skilled nursing facility after patient was found to have persistent nausea vomiting with hematemesis. Chest x-ray done at this nursing facility was showing some infiltrates and abdomen x-ray was showing concerning for bowel obstruction.    PT Comments    Patient seen for attempts to arouse and engage in session. Patient remains obtunded with minimal interaction (some unintelligible moaning). Attempted elevation of HOB to induce arousal, unsuccessfully. Performed PROM all extremities, and protective positioning with extremity elevation perform. May benefit from [facility] to maintain heel cord length and prevent contractures. Will continue to trial PT for arousal and activity progression as indicated.    Follow Up Recommendations  SNF     Equipment Recommendations  Wheelchair (measurements PT);Wheelchair cushion (measurements PT)    Recommendations for Other Services       Precautions / Restrictions Precautions Precautions: Fall Precaution Comments: abd surgery Restrictions Weight Bearing Restrictions: No    Mobility  Bed Mobility Overal bed mobility: Needs Assistance Bed Mobility: Supine to Sit;Sit to Supine     Supine to sit: Total assist;+2 for physical assistance;+2 for safety/equipment;HOB elevated     General bed mobility comments: mechanical feature of bed utilized, patient with increased extensor tone noted with positional change, no evidence of arousal despite repositioning  Transfers                 General transfer comment: unable to perform  Ambulation/Gait                 Stairs             Wheelchair Mobility    Modified Rankin (Stroke Patients Only)       Balance Overall balance  assessment: Needs assistance Sitting-balance support: Feet supported Sitting balance-Leahy Scale: Zero                                      Cognition Arousal/Alertness: Lethargic   Overall Cognitive Status: No family/caregiver present to determine baseline cognitive functioning                                 General Comments: obtunded throughout session despite arousal techniques      Exercises Other Exercises Other Exercises: PROM performed Bilateral LEs and UEs Other Exercises: Heel cord stretch performed (may benefit from PRAFOs) Other Exercises: Protective positioning for edema control and skin integrity    General Comments        Pertinent Vitals/Pain Faces Pain Scale: No hurt    Home Living                      Prior Function            PT Goals (current goals can now be found in the care plan section) Acute Rehab PT Goals PT Goal Formulation: Patient unable to participate in goal setting Time For Goal Achievement: 10/08/17 Potential to Achieve Goals: Fair Progress towards PT goals: Not progressing toward goals - comment    Frequency    Min 2X/week      PT Plan Current plan remains appropriate    Co-evaluation  AM-PAC PT "6 Clicks" Daily Activity  Outcome Measure  Difficulty turning over in bed (including adjusting bedclothes, sheets and blankets)?: Unable Difficulty moving from lying on back to sitting on the side of the bed? : Unable Difficulty sitting down on and standing up from a chair with arms (e.g., wheelchair, bedside commode, etc,.)?: Unable Help needed moving to and from a bed to chair (including a wheelchair)?: Total Help needed walking in hospital room?: Total Help needed climbing 3-5 steps with a railing? : Total 6 Click Score: 6    End of Session Equipment Utilized During Treatment: Gait belt Activity Tolerance: Patient limited by lethargy Patient left: in bed;with bed  alarm set;with call bell/phone within reach Nurse Communication: Mobility status PT Visit Diagnosis: Difficulty in walking, not elsewhere classified (R26.2);Other abnormalities of gait and mobility (R26.89)     Time: 6378-5885 PT Time Calculation (min) (ACUTE ONLY): 14 min  Charges:  $Therapeutic Activity: 8-22 mins                    G Codes:       Alben Deeds, PT DPT  Board Certified Neurologic Specialist [phone]    Duncan Dull 10/01/2017, 3:47 PM

## 2017-10-01 NOTE — Progress Notes (Signed)
MD IV TEAM in room attempted many times with Ultrasound to place IV and unsuccessful.  Patient has several IV seizure meds.  Would this patient benefit from a central line?

## 2017-10-01 NOTE — Progress Notes (Signed)
PROGRESS NOTE    Kevin Humphrey  XNA:[id number] DOB: [birth year] DOA: 09/19/2017 PCP: [name], MD    Brief Narrative:  67 y.o.malewithhistory of advanced dementia, schizophrenia, hypertension was brought from the skilled nursing facility after patient was found to have persistent nausea vomiting with hematemesis. Chest x-ray done at this nursing facility was showing some infiltrates and abdomen x-ray was showing concerning for bowel obstruction.  In the ER stool for occult blood was positive. Abdomen was distended and initially patient on arrival was confused and minimally responsive but somewhat improved after starting antibiotics and fluids. CT abdomen pelvis shows bowel obstruction with transition point. Chest x-ray was showing possibility of infiltrates. Patient was hypotensive improved with fluids and empiric antibiotic started for sepsis likely from aspiration and found to have a UTI as well.   Underwent Laparoscopic Lysis of Adhesions for SBO and improved but continued to be Encephalopathic and not following commands. Because of his AMS an MRI and EEG were ordered and Neurology was consulted to evaluate and make recommendations  Assessment & Plan:   Principal Problem:   SBO (small bowel obstruction) s/p lap adhesiolysis 09/22/2017 Active Problems:   Schizophrenia ([place])   Malnutrition of moderate degree (HCC)   Acute respiratory failure with hypoxia ([place])   ARF (acute renal failure) (HCC)   Hematemesis   Sepsis ([place])   Anxiety state   Small bowel obstruction (HCC)   Hypomagnesemia   Acute lower UTI   Acute metabolic encephalopathy   Ileus (HCC)   Volume overload   Hypokalemia  High-grade small bowel obstruction that is post laparoscopic lysis of adhesions 09/22/2017 with subsequent Ileus -With small bowel obstruction secondary to adhesions.  -Patient status post laparoscopic lysis of adhesions 09/22/2017 prior [name].  -Patient had ileus with no  bowel movements and no flatus and hypoactive bowel sounds as of 09/23/2017.  -Patient initially started on clears with plans to advance diet as tolerated -Patient noted to have concerns for depressed consciousness and even seizure activity.  Patient subsequent transferred from [facility] long hospital to [facility]  See below -Continue to aim to keep magnesium greater than 2. Keep potassium greater than 4.  -Potassium low today, will replace  Acute respiratory failure with hypoxia secondary to volume overload -Patient noted to have worsening shortness of breaththe night of 09/23/2017 to the morning of 09/24/2017,with increased O2 requirements requiring 8 L of high flow nasal cannula.  -Chest x-ray obtained consistent with volume overload.  -Urine output not accurately recorded.   -Patient had been continued on IV Lasix, now held. -We will continue to monitor ins and outs closely.  Acute Metabolic Encephalopathy in the setting of Advanced Dementia -Suspicion for medication induced cephalopathy. -Obtain Head CT w/o Contrast: Showed No acute intracranial abnormality. Mild cerebral atrophy. Chronic sinusitis. -Appreciate input by palliative care service.  Overall prognosis seems to be poor at this time. -Continue NG tube placed for tube feeds.  -Concerns for possible continued seizure activity. Discussed with Neurology. AEDs titrated by Neurology. Continue to monitor.  Initial plans for burst suppression in ICU, now plans for gradually weaning sedating meds and trying to allow pt to wake per Neurology -Ammonia level elevated. Started BID lactulose per tube. Thus far, one bowel movement  Acute Renal Failure -Likely secondary to prerenal azotemia in the setting of ACE inhibitor.  -Patient noted on admission to be hypotensive.  -Patient hydrated aggressively with IV fluids. Renal function improved and currently at baseline noted. -Repeat bmet in AM  Sepsis likely secondary to  aspiration pneumonia and Diphtheroids UTI -Per CT abdomen and pelvis as well as chest x-ray concerning for probable pneumonia.  -Patient on admission noted to be septic with hypotension, acute renal failure, tachycardia, elevated lactic acid level, elevated procalcitonin level.  -Lactic acid level and pro calcitonin levels trending down.  -Urine cultures with >100000 colonies of Diphtheroids.  -Blood cultures pending with no growth to date. IV vancomycin was discontinued.  -completed course of IV Zosyn -Will repeat cbc in AM  Anemia/Hematemesis -Patient on admission was noted to have some hematemesis however no hematemesis noted during the hospitalization.  -Patient with no overt bleeding.  -Anemia panel has been checked and iron level at 11. Ferritin at 142. Vitamin B12 levels at 282.  -Continue vitamin B12 IM injections.  -Hb/Hct now stable at 10.0/30.2 -Patient s/pIV Feraheme.  -Follow H&H, transfusion threshold hemoglobin less than 8.  -Will likely need oral iron supplementation on discharge.  -Continue PPI as tolerated -Repeat CBC profile in AM  Hypertension -On admission patient noted to be septic with hypotension.  -Blood pressure improved. BP elevated.  -Antihypertensive medications on hold secondary to problem #1.  -Continue IV Lopressor and IV Hydralazine.  -Diuretics were discontinued as noted above.  Schizophrenia -Patient had been on IV Depakote 250 mg q8h.  -Patient remains poorly responsive.  Hypomagnesemia -Continue to Monitor and Replete as Necessary -Labs reviewed. Recent Magnesium within normal limits  Hypernatremia -worsening -will continue on 100cc free water flush per tube TID  Hypokalemia -Recheck basic metabolic panel in the morning -Low this AM, will replace -Will repeat basic metabolic panel in morning  Abnormal LFTs -Patient's AST was 5 and ALT was 66 -Possibly in the setting of surgery -Will repeat LFT in  AM  Hyperbilirubinemia -Mild at 1.3 -Will continue to follow for now  Thrombocytosis -Likely reactive in the setting of aspiration infection along with UTI -Stable at present -Repeat CBC in AM  DVT prophylaxis: SCDs Code Status: DO NOT RESUSCITATE/DO NOT INTUBATE Family Communication: Patient in room, APS guardian at bedside Disposition Plan: Uncertain at this time  Consultants:   [facility]   Neurology Dr. Cheral Marker  Procedures:   Patient status post lysis of adhesions  Antimicrobials: Anti-infectives (From admission, onward)   Start     Dose/Rate Route Frequency Ordered Stop   09/22/17 1000  cefoTEtan (CEFOTAN) 2 g in sodium chloride 0.9 % 100 mL IVPB     2 g 200 mL/hr over 30 Minutes Intravenous On call to O.R. 09/22/17 0852 09/22/17 1600   09/21/17 1700  vancomycin (VANCOCIN) IVPB 750 mg/150 ml premix  Status:  Discontinued     750 mg 150 mL/hr over 60 Minutes Intravenous Every 12 hours 09/21/17 0956 09/22/17 0959   09/21/17 0600  vancomycin (VANCOCIN) IVPB 1000 mg/200 mL premix  Status:  Discontinued     1,000 mg 200 mL/hr over 60 Minutes Intravenous Every 24 hours 09/20/17 0628 09/20/17 0921   09/21/17 0600  vancomycin (VANCOCIN) IVPB 750 mg/150 ml premix  Status:  Discontinued     750 mg 150 mL/hr over 60 Minutes Intravenous Every 24 hours 09/20/17 0921 09/21/17 0956   09/20/17 0800  piperacillin-tazobactam (ZOSYN) IVPB 3.375 g  Status:  Discontinued     3.375 g 12.5 mL/hr over 240 Minutes Intravenous Every 8 hours 09/20/17 0612 09/29/17 1708   09/20/17 0130  vancomycin (VANCOCIN) 1,500 mg in sodium chloride 0.9 % 500 mL IVPB     1,500 mg 250  mL/hr over 120 Minutes Intravenous  Once 09/20/17 0100 09/20/17 0453   09/20/17 0100  piperacillin-tazobactam (ZOSYN) IVPB 3.375 g     3.375 g 100 mL/hr over 30 Minutes Intravenous  Once 09/20/17 0047 09/20/17 0214   09/20/17 0100  vancomycin (VANCOCIN) IVPB 1000 mg/200 mL premix  Status:   Discontinued     1,000 mg 200 mL/hr over 60 Minutes Intravenous  Once 09/20/17 0047 09/20/17 0100      Subjective: Cannot obtain from patient given lethargy  Objective: Vitals:   10/01/17 1219 10/01/17 1303 10/01/17 1304 10/01/17 1544  BP: 126/72 127/78  (!) 143/71  Pulse: 94 86 84 87  Resp:    18  Temp: 98.8 F (37.1 C)   97.7 F (36.5 C)  TempSrc: Axillary   Axillary  SpO2: 94% 94% 94% 95%  Weight:      Height:        Intake/Output Summary (Last 24 hours) at 10/01/2017 1722 Last data filed at 10/01/2017 1547 Gross per 24 hour  Intake 1882.5 ml  Output 2050 ml  Net -167.5 ml   Filed Weights   09/27/17 0413 09/29/17 0411 09/30/17 0447  Weight: 69.6 kg (153 lb 7 oz) 72.8 kg (160 lb 7.9 oz) 75.5 kg (166 lb 7.2 oz)    Examination: General exam: unresponsive, laying in bed, in nad Respiratory system: Normal respiratory effort, no wheezing Cardiovascular system: regular rate, s1, s2 Gastrointestinal system: Soft, nondistended, positive BS Central nervous system: CN2-12 grossly intact, strength intact Extremities: Perfused, no clubbing Skin: Normal skin turgor, no notable skin lesions seen Psychiatry: unable to obtain given lethargy  Data Reviewed: I have personally reviewed following labs and imaging studies  CBC: Recent Labs  Lab 09/25/17 0758 09/26/17 0259 09/27/17 0312  WBC 18.1* 17.7* 16.2*  NEUTROABS  --   --  11.6*  HGB 9.8* 9.4* 10.0*  HCT 29.5* 27.9* 30.2*  MCV 92.5 91.2 92.6  PLT 402* 408* 585*   Basic Metabolic Panel: Recent Labs  Lab 09/25/17 0758 09/26/17 0259 09/27/17 0312 09/29/17 0922 09/30/17 1007 10/01/17 0440  NA 143 147* 145 146* 148* 150*  K 3.7 3.3* 3.4* 3.2* 3.4* 3.3*  CL 105 103 99* 107 111 109  CO2 25 35* 34* 31 29 31   GLUCOSE 97 101* 101* 150* 137* 104*  BUN 19 21* 31* 30* 25* 26*  CREATININE 0.93 1.03 1.10 0.96 0.77 0.84  CALCIUM 8.4* 8.6* 8.7* 7.9* 7.8* 8.3*  MG 2.1 2.3 2.1 2.3  --   --   PHOS  --   --  3.8  --   --    --    GFR: Estimated Creatinine Clearance: 89.6 mL/min (by C-G formula based on SCr of 0.84 mg/dL). Liver Function Tests: Recent Labs  Lab 09/27/17 0312 09/30/17 1007  AST 65* 30  ALT 66* 44  ALKPHOS 92 84  BILITOT 1.3* 0.4  PROT 6.5 5.4*  ALBUMIN 2.9* 2.4*   No results for input(s): LIPASE, AMYLASE in the last 168 hours. Recent Labs  Lab 09/28/17 0526 10/01/17 1030  AMMONIA 68* 176*   Coagulation Profile: No results for input(s): INR, PROTIME in the last 168 hours. Cardiac Enzymes: No results for input(s): CKTOTAL, CKMB, CKMBINDEX, TROPONINI in the last 168 hours. BNP (last 3 results) No results for input(s): PROBNP in the last 8760 hours. HbA1C: No results for input(s): HGBA1C in the last 72 hours. CBG: Recent Labs  Lab 09/30/17 2350 10/01/17 0350 10/01/17 0910 10/01/17 1218 10/01/17 1652  GLUCAP  112* 90 123* 135* 110*   Lipid Profile: No results for input(s): CHOL, HDL, LDLCALC, TRIG, CHOLHDL, LDLDIRECT in the last 72 hours. Thyroid Function Tests: Recent Labs    10/01/17 1030  TSH 1.786   Anemia Panel: Recent Labs    10/01/17 1030  VITAMINB12 1,085*   Sepsis Labs: No results for input(s): PROCALCITON, LATICACIDVEN in the last 168 hours.  No results found for this or any previous visit (from the past 240 hour(s)).   Radiology Studies: No results found.  Scheduled Meds: . bisacodyl  10 mg Rectal Daily  . feeding supplement (PRO-STAT SUGAR FREE 64)  30 mL Per Tube BID  . free water  100 mL Per Tube Q8H  . lactulose  30 g Per Tube BID  . mouth rinse  15 mL Mouth Rinse BID  . metoprolol tartrate  5 mg Intravenous Q8H  . polyethylene glycol  17 g Oral BID  . sorbitol, milk of mag, mineral oil, glycerin (SMOG) enema  960 mL Rectal Once   Continuous Infusions: . chlorproMAZINE (THORAZINE) IV    . famotidine (PEPCID) IV Stopped (10/01/17 1330)  . feeding supplement (OSMOLITE 1.5 CAL) 50 mL/hr at 10/01/17 0550  . lacosamide (VIMPAT) IV Stopped  (10/01/17 1130)  . levETIRAcetam 1,500 mg (10/01/17 0550)  . methocarbamol (ROBAXIN)  IV Stopped (09/26/17 0030)  . valproate sodium Stopped (10/01/17 1415)     LOS: 11 days   Marylu Lund, MD [facility] Pager [phone]  If 7PM-7AM, please contact night-coverage [email or web address] Password [email or web address] 10/01/2017, 5:22 PM

## 2017-10-01 NOTE — Plan of Care (Signed)
  Problem: Nutrition: Goal: Adequate nutrition will be maintained Outcome: Progressing   Problem: Elimination: Goal: Will not experience complications related to bowel motility Outcome: Progressing   Problem: Activity: Goal: Risk for activity intolerance will decrease Outcome: Not Progressing   

## 2017-10-01 NOTE — Progress Notes (Signed)
vLTM EEG complete. Slight skin breakdown on the forehead.

## 2017-10-01 NOTE — Final Consult Note (Signed)
Consultant Final Sign-Off Note    Assessment/Final recommendations  Kevin Humphrey is a 67 y.o. male followed by me for SBO 2/2 adhesions, S/PLAPAROSCOPICLYSIS OF ADHESIONS6/1/19 Dr. Johney Humphrey    Wound care (if applicable): laparoscopic port sites have dermabond and are healing well. No submerging wounds in water until they heal (2 weeks postop) okay to wash or shower.   Diet at discharge: per primary team pt is tolerating tube feeds and having bowel function. Will need a swallow study and speech eval once he is more alert. If long term feeding tube is required please contact us for possible PEG placement.    Activity at discharge: per primary team   Follow-up appointment:  2 weeks with [facility], in discharge paperwork   Pending results:  Unresulted Labs (From admission, onward)   Start     Ordered   09/28/17 0500  CBC with Differential/Platelet  Tomorrow morning,   R    Question:  Specimen collection method  Answer:  Lab=Lab collect   09/27/17 1442       Medication recommendations:   Other recommendations:    Thank you for allowing [place] to participate in the care of your patient!  Please consult [place] again if you have further needs for your patient.  Anajulia Leyendecker L Ariston Grandison 10/01/2017 9:15 AM    Subjective   CC: S/P laparoscopic LOA  Pt is sedated and will not wake.   Objective  Vital signs in last 24 hours: Temp:  [97.4 F (36.3 C)-98.7 F (37.1 C)] 98.7 F (37.1 C) (06/10 0900) Pulse Rate:  [79-88] 79 (06/10 0900) Resp:  [16-21] 20 (06/10 0900) BP: (116-170)/(55-87) 116/55 (06/10 0900) SpO2:  [91 %-100 %] 95 % (06/10 0900)  PE: Gen:  Lethargic, not responsive, thin and ill appearing Pulm:  Rate and effort normal Abd: Soft, not distended, +BS, incisions with glue intact are without signs of infection, pt did not grimace or response with palpation of abdomen. Skin: warm and dry   Pertinent labs and Studies: No results for input(s): WBC, HGB, HCT in the last 72  hours. BMET Recent Labs    09/30/17 1007 10/01/17 0440  NA 148* 150*  K 3.4* 3.3*  CL 111 109  CO2 29 31  GLUCOSE 137* 104*  BUN 25* 26*  CREATININE 0.77 0.84  CALCIUM 7.8* 8.3*   No results for input(s): LABURIN in the last 72 hours. Results for orders placed or performed during the hospital encounter of 09/19/17  Blood Culture (routine x 2)     Status: None   Collection Time: 09/19/17 11:15 PM  Result Value Ref Range Status   Specimen Description   Final    BLOOD LEFT ARM UPPER Performed at [facility] [address], [place], [place] [address]    Special Requests   Final    BOTTLES DRAWN AEROBIC AND ANAEROBIC Blood Culture adequate volume Performed at [facility] [address], [place], [place] [address]    Culture   Final    NO GROWTH 5 DAYS Performed at [facility], [place] [address], [place], [place] [address]    Report Status 09/25/2017 FINAL  Final  Urine culture     Status: Abnormal   Collection Time: 09/20/17  1:28 AM  Result Value Ref Range Status   Specimen Description   Final    URINE, CATHETERIZED Performed at [facility] [address], [place],  [address]    Special Requests   Final  NONE Performed at [facility], [place] [address], [place], [place] [address]    Culture (A)  Final    >=100,000 COLONIES/mL DIPHTHEROIDS(CORYNEBACTERIUM SPECIES)   Report Status 09/21/2017 FINAL  Final  MRSA PCR Screening     Status: Abnormal   Collection Time: 09/20/17 12:37 PM  Result Value Ref Range Status   MRSA by PCR POSITIVE (A) NEGATIVE Final    Comment:        The GeneXpert MRSA Assay (FDA approved for NASAL specimens only), is one component of a comprehensive MRSA colonization surveillance program. It is not intended to diagnose MRSA infection nor to guide or monitor treatment for MRSA infections. RESULT CALLED TO, READ BACK BY AND VERIFIED WITH: Alcide Clever [id number] @ 4035 BY J SCOTTON Performed at [facility] [address] [place], [place] [address]     Imaging: No results found.

## 2017-10-01 NOTE — Procedures (Signed)
  Video EEG Monitoring Report     Dates of monitoring:  09/30/17 at 07 33  am - 10/01/17 at 07 29 am  Recording day:    Day 4 EEG Number:    n/a Requesting provider:   [place] Interpreting physician:  Jolene Schimke, MD  CPT:  [phone] ICD-10:  G45.901   Present History: 67 year old male with schizophrenia, dementia and episodes of confusion.  TYPE OF RECORDING: At least 18-channel digital EEG with using a standard international 10-20 placement with additional EEG electrodes, and 1 additional channel dedicated to the EKG, at a sampling rate of at least 256 Hz.   Day 2: Background activities marked by disorganized delta and theta frequencies at times reaching 5 to 6 cps with some posterior gradient.  More prominent slowing noted across right hemisphere particularly right frontocentral and central parietal cortex at times caring sharply contoured morphology and poorly formed sharp waves.  Frequently background activity is interrupted by buildup of 4 to 5 cps rhythmic synchronized evolving sharp waves distributed broadly between 2 hemispheres however appeared to be slight high amplitude and better developed across the right central parietal and frontal central cortex.  Clinically appear to be some neck rhythmic movements and head movements forward lateralization is difficult to discern.  This pattern most consistent with electrographic seizures and can last for several minutes with subsequent resolution.  This seizures occur throughout the day with last seizure around 1 AM.  Day 3: Background activities  are marked by 4-5 cps broadly distributed rhythm . Superimposed right anterior frontal and  centro parietal delta slowing noted, occasionally right CP and right frontopolar  sharp waves present. No definitive seizures present.  Day 4: No seizures. Background activities  are marked by  broadly distributed delta slowing. At times reaching 5-6 cps.     Clinical interpretation: This day 4 of intensive EEG  monitoring with simultaneous video monitoring did not demonstrate any  seizures. EEG  is abnormal due to  background activity slowing  suggestive of encephalopathy of nonspecific etiologies.

## 2017-10-01 NOTE — Progress Notes (Addendum)
Neurology Progress Note   S:// Continues to be obtunded and unresponsive  O:// Current vital signs: BP (!) 116/55 (BP Location: Left Arm)   Pulse 79   Temp 98.7 F (37.1 C) (Axillary)   Resp 20   Ht 5\' 11"  (1.803 m)   Wt 75.5 kg (166 lb 7.2 oz)   SpO2 95%   BMI 23.21 kg/m  Vital signs in last 24 hours: Temp:  [97.4 F (36.3 C)-98.7 F (37.1 C)] 98.7 F (37.1 C) (06/10 0900) Pulse Rate:  [79-88] 79 (06/10 0900) Resp:  [16-21] 20 (06/10 0900) BP: (116-170)/(55-87) 116/55 (06/10 0900) SpO2:  [91 %-100 %] 95 % (06/10 0900) General: Frail elderly man in no apparent distress CVS: S1-S2 heard regular rate rhythm H ENT: Normocephalic atraumatic Respiratory: Scattered rhonchi all over Neurological exam Mental status: Obtunded, does not open eyes to voice or noxious edematous.  Does not follow commands. Cannot assess speech due to above. Cranial nerves: Eyes initially closed, when open, pupils 6 mm and responding to light bilaterally constricting to 3 mm. Motor/sensory.  No spontaneous movements.  No localization. Does not respond to sternal rub.  Flaccid in all 4 extremities. DTRs: Mute all over Medications  Current Facility-Administered Medications:  .  [DISCONTINUED] acetaminophen (TYLENOL) tablet 650 mg, 650 mg, Oral, Q6H PRN **OR** acetaminophen (TYLENOL) suppository 650 mg, 650 mg, Rectal, Q6H PRN, Michael Boston, MD, 650 mg at 09/24/17 1711 .  bisacodyl (DULCOLAX) suppository 10 mg, 10 mg, Rectal, Daily, Michael Boston, MD, 10 mg at 09/30/17 0954 .  chlorproMAZINE (THORAZINE) 25 mg in sodium chloride 0.9 % 25 mL IVPB, 25 mg, Intravenous, Q6H PRN, Michael Boston, MD .  famotidine (PEPCID) IVPB 20 mg premix, 20 mg, Intravenous, Q12H, Michael Boston, MD, Stopped at 10/01/17 0423 .  feeding supplement (OSMOLITE 1.5 CAL) liquid 1,000 mL, 1,000 mL, Per Tube, Continuous, Donne Hazel, MD, Last Rate: 50 mL/hr at 10/01/17 0550 .  feeding supplement (PRO-STAT SUGAR FREE 64) liquid 30  mL, 30 mL, Per Tube, BID, Donne Hazel, MD, 30 mL at 09/30/17 2223 .  hydrALAZINE (APRESOLINE) injection 10 mg, 10 mg, Intravenous, Q4H PRN, Michael Boston, MD, 10 mg at 09/27/17 1615 .  lacosamide (VIMPAT) 200 mg in sodium chloride 0.9 % 25 mL IVPB, 200 mg, Intravenous, Q12H, Kerney Elbe, MD, Stopped at 10/01/17 0036 .  levETIRAcetam (KEPPRA) IVPB 1500 mg/ 100 mL premix, 1,500 mg, Intravenous, Q12H, [place], Lanice Schwab, MD, Last Rate: 400 mL/hr at 10/01/17 0550, 1,500 mg at 10/01/17 0550 .  LORazepam (ATIVAN) injection 2 mg, 2 mg, Intravenous, Q6H, Kerney Elbe, MD, 2 mg at 10/01/17 0254 .  MEDLINE mouth rinse, 15 mL, Mouth Rinse, BID, Michael Boston, MD, 15 mL at 09/30/17 2247 .  methocarbamol (ROBAXIN) 1,000 mg in dextrose 5 % 50 mL IVPB, 1,000 mg, Intravenous, Q6H PRN, Michael Boston, MD, Stopped at 09/26/17 0030 .  metoprolol tartrate (LOPRESSOR) injection 5 mg, 5 mg, Intravenous, Q8H, Eugenie Filler, MD, 5 mg at 10/01/17 2706 .  morphine 2 MG/ML injection 0.5-1 mg, 0.5-1 mg, Intravenous, Q4H PRN, Eugenie Filler, MD, 0.5 mg at 09/25/17 2156 .  mupirocin ointment (BACTROBAN) 2 % 1 application, 1 application, Nasal, BID, Raiford Noble [place], [place], 1 application at 23/76/28 2225 .  ondansetron (ZOFRAN) tablet 4 mg, 4 mg, Oral, Q6H PRN **OR** ondansetron (ZOFRAN) injection 4 mg, 4 mg, Intravenous, Q6H PRN, Michael Boston, MD, 4 mg at 09/25/17 0525 .  polyethylene glycol (MIRALAX / GLYCOLAX) packet 17 g, 17 g,  Oral, BID, Ralene Ok, MD, 17 g at 09/30/17 2224 .  prochlorperazine (COMPAZINE) injection 5-10 mg, 5-10 mg, Intravenous, Q4H PRN, Michael Boston, MD, 5 mg at 09/25/17 0408 .  sorbitol, milk of mag, mineral oil, glycerin (SMOG) enema, 960 mL, Rectal, Once, Gross, Remo Lipps, MD .  valproate (DEPACON) 750 mg in dextrose 5 % 50 mL IVPB, 750 mg, Intravenous, Q8H, Kerney Elbe, MD, Last Rate: 57.5 mL/hr at 10/01/17 0642, 750 mg at 10/01/17 0642 Labs CBC    Component Value Date/Time   WBC  16.2 (H) 09/27/2017 0312   RBC 3.26 (L) 09/27/2017 0312   HGB 10.0 (L) 09/27/2017 0312   HCT 30.2 (L) 09/27/2017 0312   PLT 476 (H) 09/27/2017 0312   MCV 92.6 09/27/2017 0312   MCH 30.7 09/27/2017 0312   MCHC 33.1 09/27/2017 0312   RDW 14.7 09/27/2017 0312   LYMPHSABS 2.1 09/27/2017 0312   MONOABS 2.1 (H) 09/27/2017 0312   EOSABS 0.2 09/27/2017 0312   BASOSABS 0.2 (H) 09/27/2017 0312    CMP     Component Value Date/Time   NA 150 (H) 10/01/2017 0440   NA 137 08/27/2015   K 3.3 (L) 10/01/2017 0440   CL 109 10/01/2017 0440   CO2 31 10/01/2017 0440   GLUCOSE 104 (H) 10/01/2017 0440   BUN 26 (H) 10/01/2017 0440   BUN 16 08/27/2015   CREATININE 0.84 10/01/2017 0440   CALCIUM 8.3 (L) 10/01/2017 0440   PROT 5.4 (L) 09/30/2017 1007   ALBUMIN 2.4 (L) 09/30/2017 1007   AST 30 09/30/2017 1007   ALT 44 09/30/2017 1007   ALKPHOS 84 09/30/2017 1007   BILITOT 0.4 09/30/2017 1007   GFRNONAA >60 10/01/2017 0440   GFRAA >60 10/01/2017 0440    Imaging I have reviewed images in epic and the results pertinent to this consultation are: MRI examination of the brain-09/27/2017-motion limited with no acute findings.  Long-term video EEG: 67 year old male with schizophrenia, dementia and episodes of confusion. TYPE OF RECORDING: At least 18-channel digital EEG with using a standard international 10-20 placement with additional EEG electrodes, and 1 additional channel dedicated to the EKG, at a sampling rate of at least 256 Hz.   Day 2: Background activities marked by disorganized delta and theta frequencies at times reaching 5 to 6 cps with some posterior gradient.  More prominent slowing noted across right hemisphere particularly right frontocentral and central parietal cortex at times caring sharply contoured morphology and poorly formed sharp waves.  Frequently background activity is interrupted by buildup of 4 to 5 cps rhythmic synchronized evolving sharp waves distributed broadly between 2  hemispheres however appeared to be slight high amplitude and better developed across the right central parietal and frontal central cortex.  Clinically appear to be some neck rhythmic movements and head movements forward lateralization is difficult to discern.  This pattern most consistent with electrographic seizures and can last for several minutes with subsequent resolution.  This seizures occur throughout the day with last seizure around 1 AM.  Day 3: Background activities  are marked by 4-5 cps broadly distributed rhythm . Superimposed right anterior frontal and  centro parietal delta slowing noted, occasionally right CP and right frontopolar  sharp waves present. No definitive seizures present.  Day 4: No seizures. Background activities  are marked by  broadly distributed delta slowing. At times reaching 5-6 cps.     Clinical interpretation: This day 4 of intensive EEG monitoring with simultaneous video monitoring did not demonstrate any  seizures. EEG  is abnormal due to  background activity slowing  suggestive of encephalopathy of nonspecific etiologies.  Ammonia 68 on 6/7  Assessment:  67 year old man with schizophrenia, dementia, episodes of confusion, who underwent adhesio lysis on 09/22/2017 and became encephalopathic and continued to be encephalopathic. EEG on 09/27/2017 showed a 5-minute seizure concerning for nonconvulsive status. Given that the patient is a ward of the state and had DNR prior to hospital arrival, the power of attorney recommended only treating neurological deficits of the reversible. Significant concern of not being able to extubate him if he is intubated for burst suppression prevented burst suppression.  Next line he is being treated with antiepileptics-valproate, Keppra, Vimpat and Ativan at this time.  His long-term EEG over the last 24 hours has not showed any seizures activity and shows generalized slowing only, which is different in the past few days where it had  shown more of a right-sided preponderance suggestive of possible neuronal dysfunction.  Interestingly, it has been mentioned in the consult HPI, that the patient's RN Was told by his old hospice caregiver that he has days where he sleeps and is somnolent but then perks back up. Not sure if the behavioral issues are contributing to current presentation but some labs such as UA etc have not been checked and I have ordered. Also had hyperammonemia 2 days ago and repeat ammonia has not been checked.  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  Might consider lowering one at a time as antiepileptics might be contributing to altered mental status as well -He is on standing doses of Ativan, which I would recommend be discontinued and changed to as needed. -Check urinalysis, B12, TSH -Recheck ammonia -Recheck brain MRI -As far as the LTM EEG is concerned, I do not think that he has ongoing seizure activity. I will d/c that for now. -Minimize sedating meds We will follow with you.  -- Amie Portland, MD [facility] Pager: [phone] If 7pm to 7am, please call on call as listed on [facility]ION.

## 2017-10-02 ENCOUNTER — Inpatient Hospital Stay (HOSPITAL_COMMUNITY): Payer: Medicare Other

## 2017-10-02 LAB — GLUCOSE, CAPILLARY
GLUCOSE-CAPILLARY: 126 mg/dL — AB (ref 65–99)
GLUCOSE-CAPILLARY: 109 mg/dL — AB (ref 65–99)
Glucose-Capillary: 104 mg/dL — ABNORMAL HIGH (ref 65–99)
Glucose-Capillary: 130 mg/dL — ABNORMAL HIGH (ref 65–99)
Glucose-Capillary: 110 mg/dL — ABNORMAL HIGH (ref 65–99)
Glucose-Capillary: 94 mg/dL (ref 65–99)

## 2017-10-02 LAB — BASIC METABOLIC PANEL
ANION GAP: 5 (ref 5–15)
BUN: 30 mg/dL — ABNORMAL HIGH (ref 6–20)
CALCIUM: 8.4 mg/dL — AB (ref 8.9–10.3)
CO2: 30 mmol/L (ref 22–32)
Chloride: 117 mmol/L — ABNORMAL HIGH (ref 101–111)
Creatinine, Ser: 0.73 mg/dL (ref 0.61–1.24)
GFR calc Af Amer: >60 mL/min (ref >60)
GFR calc non Af Amer: >60 mL/min (ref >60)
GLUCOSE: 121 mg/dL — AB (ref 65–99)
POTASSIUM: 3.8 mmol/L (ref 3.5–5.1)
Sodium: 152 mmol/L — ABNORMAL HIGH (ref 135–145)

## 2017-10-02 LAB — AMMONIA: AMMONIA: 142 umol/L — AB (ref 9–35)

## 2017-10-02 LAB — SODIUM
SODIUM: 151 mmol/L — AB (ref 135–145)
SODIUM: 151 mmol/L — AB (ref 135–145)
Sodium: 150 mmol/L — ABNORMAL HIGH (ref 135–145)

## 2017-10-02 LAB — MAGNESIUM: Magnesium: 2.3 mg/dL (ref 1.7–2.4)

## 2017-10-02 MED ORDER — LACTULOSE 10 GM/15ML PO SOLN
30.0000 g | Freq: Three times a day (TID) | ORAL | Status: DC
  Administered 2017-10-02: 30 g
  Filled 2017-10-02 (×3): qty 60

## 2017-10-02 MED ORDER — FUROSEMIDE 10 MG/ML IJ SOLN
40.0000 mg | Freq: Once | INTRAMUSCULAR | Status: AC
  Administered 2017-10-02: 40 mg via INTRAVENOUS
  Filled 2017-10-02: qty 4

## 2017-10-02 MED ORDER — FREE WATER
200.0000 mL | Freq: Three times a day (TID) | Status: DC
  Administered 2017-10-02 – 2017-10-07 (×8): 200 mL

## 2017-10-02 MED ORDER — PIPERACILLIN-TAZOBACTAM 3.375 G IVPB
3.3750 g | Freq: Three times a day (TID) | INTRAVENOUS | Status: AC
  Administered 2017-10-02 – 2017-10-08 (×18): 3.375 g via INTRAVENOUS
  Filled 2017-10-02 (×19): qty 50

## 2017-10-02 NOTE — Progress Notes (Addendum)
Daily Progress Note   Patient Name: Kevin Humphrey       Date: 10/02/2017 DOB: 09-08-1950  Age: 67 y.o. MRN#: [id number] Attending Physician: Donne Hazel, MD Primary Care Physician: Duffy Bruce, Manya Silvas, MD Admit Date: 09/19/2017  Reason for Consultation/Follow-up: Establishing goals of care  Subjective: Kevin Humphrey is unresponsive. No visitors at bedside.   Length of Stay: 12  Current Medications: Scheduled Meds:  . bisacodyl  10 mg Rectal Daily  . feeding supplement (PRO-STAT SUGAR FREE 64)  30 mL Per Tube BID  . free water  200 mL Per Tube Q8H  . lactulose  30 g Per Tube TID  . mouth rinse  15 mL Mouth Rinse BID  . metoprolol tartrate  5 mg Intravenous Q8H  . polyethylene glycol  17 g Oral BID  . sorbitol, milk of mag, mineral oil, glycerin (SMOG) enema  960 mL Rectal Once    Continuous Infusions: . chlorproMAZINE (THORAZINE) IV    . famotidine (PEPCID) IV Stopped (10/02/17 1406)  . feeding supplement (OSMOLITE 1.5 CAL) 1,000 mL (10/02/17 0326)  . lacosamide (VIMPAT) IV Stopped (10/02/17 1406)  . levETIRAcetam Stopped (10/02/17 0629)  . methocarbamol (ROBAXIN)  IV Stopped (09/26/17 0030)    PRN Meds: [DISCONTINUED] acetaminophen **OR** acetaminophen, chlorproMAZINE (THORAZINE) IV, hydrALAZINE, methocarbamol (ROBAXIN)  IV, morphine injection, ondansetron **OR** ondansetron (ZOFRAN) IV, prochlorperazine  Physical Exam  Constitutional: He appears cachectic. He appears toxic. He has a sickly appearance.  Cardiovascular: Normal rate.  Pulmonary/Chest: No accessory muscle usage. No tachypnea. No respiratory distress. He has rhonchi in the right upper field, the right middle field, the right lower field and the left lower field. He has rales in the right upper field, the  right middle field, the right lower field and the left lower field.  Copious thick, yellow secretions and unable to clear airway, weak cough, no gag reflex  Abdominal: Soft.  Neurological: He is unresponsive.  Nursing note and vitals reviewed.           Vital Signs: BP (!) 121/55 (BP Location: Left Arm)   Pulse 82   Temp 98.3 F (36.8 C) (Oral)   Resp 20   Ht 5\' 11"  (1.803 m)   Wt 75.9 kg (167 lb 5.3 oz)   SpO2 93%   BMI 23.34 kg/m  SpO2: SpO2: 93 % O2 Device: O2 Device: Nasal Cannula O2 Flow Rate: O2 Flow Rate (L/min): 2 L/min  Intake/output summary:   Intake/Output Summary (Last 24 hours) at 10/02/2017 1605 Last data filed at 10/02/2017 1406 Gross per 24 hour  Intake 1461.33 ml  Output 800 ml  Net 661.33 ml   LBM: Last BM Date: 10/01/17 Baseline Weight: Weight: 72.7 kg (160 lb 4.4 oz) Most recent weight: Weight: 75.9 kg (167 lb 5.3 oz)       Palliative Assessment/Data:      Patient Active Problem List   Diagnosis Date Noted  . Volume overload 09/24/2017  . Hypokalemia   . Acute metabolic encephalopathy 51/05/5850  . Ileus ([place]) 09/23/2017  . Acute lower UTI 09/22/2017  . Anxiety state 09/21/2017  . Small bowel obstruction ([place])   . Hypomagnesemia   . SBO (small bowel obstruction) s/p lap adhesiolysis 09/22/2017 09/20/2017  . ARF (acute renal failure) ([place]) 09/20/2017  . Hematemesis 09/20/2017  . Sepsis ([place]) 09/20/2017  . HCAP (healthcare-associated pneumonia)   . Ventilator dependent ([place])   . Acute cystitis with hematuria   . Bacteremia   . Accelerated hypertension   . Anemia, chronic disease   . Acute kidney injury ([place])   . Hypernatremia   . Protein-calorie malnutrition ([place])   . Acute encephalopathy   . Palliative care encounter 12/17/2014  . Dysphagia 12/17/2014  . Acute respiratory failure with hypoxia ([place]) 12/14/2014  . Staphylococcal pneumonia ([place]) 12/14/2014  . Septic shock ([place]) 12/14/2014  . AKI (acute kidney injury) ([place]) 12/14/2014    . Respiratory failure ([place])   . Pneumonia 12/12/2014  . Malnutrition of moderate degree ([place]) 12/12/2014  . Pressure ulcer 12/12/2014  . Convulsions/seizures ([place]) 08/20/2013  . Other and unspecified hyperlipidemia 08/02/2012  . GERD (gastroesophageal reflux disease) 08/02/2012  . Schizophrenia ([place]) 08/02/2012  . Depression 08/02/2012  . Allergic rhinitis 08/02/2012  . Dysphagia, unspecified(787.20) 08/02/2012  . Vertebral compression fracture ([place]) 08/02/2012    Palliative Care Assessment & Plan   HPI: 67 y.o. male admitted on 09/19/2017 from [facility] with persistent nausea and vomiting with hematemesis. He has a past medical history significant for advanced dementia, schizophrenia, hypertension, GERD, compression fracture (2014), dysphagia, and depression. He had a Chest x-ray done at the facility which showed some infiltrates and abdomen x-ray was  concerning for bowel obstruction. During his ED course stool was occult positive. Abdomen was distended and initially patient on arrival was confused and minimally responsive but somewhat improved after starting antibiotics and fluids. CT abdomen pelvis showed bowel obstruction with transition point. Chest x-ray was showing possibility of infiltrates. Patient was hypotensive improved with fluids and empiric antibiotic started for sepsis likely from aspiration. Since admission he has been seen by surgery and NG has been placed for bowel rest and had subsequent laparoscopic lysis of adhesions and SBO with initial improvement in bowel function but has had complications of decreased mental status. 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 MRI normal but remains unresponsive.    Assessment: Kevin Humphrey' is completely unresponsive, dilated pupils, no gag reflex. He has copious secretions making protection of his airway concerning. His ammonia levels remain extremely elevated.  He has also had evidence of seizure activity. MRI last night showed no new acute events.   I worry about his cycle of complications and that we will not be able to break this cycle. Continue working to decrease ammonia level  and will continue to assess for improvement in mental status with lower ammonia levels. I left voicemail requesting return call for APS guardian [facility]. APS have so far requested aggressive care with DNR in place with hopes that he may have return of previous QOL at facility (mainly wheelchair bound but able to interact and participate in activities). I think we are getting farther and farther away from return to this baseline. Plan to discuss further with APS guardians. Will continue to follow.   Recommendations/Plan:  Monitor improvement of mental status with plans to continue to decrease ammonia levels.   High risk for acute decompensation in respiratory status and likely with continuous aspiration (I suctioned out many thick, yellow secretions with no gag reflex present).   Low threshold for full comfort care.    Code Status:  DNR  Prognosis:   Likely < 2 weeks unless significant improvement in mental status.   Discharge Planning:  To Be Determined   Thank you for allowing the Palliative Medicine Team to assist in the care of this patient.   Total Time 40 min Prolonged Time Billed  no       Greater than 50%  of this time was spent counseling and coordinating care related to the above assessment and plan.  Vinie Sill, NP Palliative Medicine Team Pager # [phone] (M-F 8a-5p) Team Phone # [phone] (Nights/Weekends)

## 2017-10-02 NOTE — Progress Notes (Signed)
Pharmacy Antibiotic Note  Kevin Humphrey is a 67 y.o. male known to pharmacy from previous abx dosing.  Pt acutely decompensated 6/11 with concerns for aspiration.  To start Zosyn for aspiration PNA.    Plan: Zosyn 3.375g IV q8h (4 hour infusion).  Height: 5\' 11"  (180.3 cm) Weight: 167 lb 5.3 oz (75.9 kg) IBW/kg (Calculated) : 75.3  Temp (24hrs), Avg:98.5 F (36.9 C), Min:97.6 F (36.4 C), Max:99.6 F (37.6 C)  Recent Labs  Lab 09/26/17 0259 09/27/17 0312 09/29/17 0922 09/30/17 1007 10/01/17 0440 10/02/17 0519  WBC 17.7* 16.2*  --   --   --   --   CREATININE 1.03 1.10 0.96 0.77 0.84 0.73    Estimated Creatinine Clearance: 94.1 mL/min (by C-G formula based on SCr of 0.73 mg/dL).    No Known Allergies   Thank you for allowing pharmacy to be a part of this patient's care.  [employer], Pharm.D., BCPS Clinical Pharmacist Pager: [phone] Clinical phone for 10/02/2017 is [phone]. 10/02/2017 7:25 PM

## 2017-10-02 NOTE — Treatment Plan (Signed)
Called by RN for acute decompensation. Patient's RR now up to 35 breaths per min with coarse breath sounds. Concerns for aspiration in setting of tube feeding vs volume overload. Ordered STAT CXR. Will give 40mg  IV lasix now. Hold tube feeds. Updated POA, Cheryl of APS who is agreeing with above tx. She understands that patient's condition is poor and pt may decompensate rapidly. DNR confirmed.

## 2017-10-02 NOTE — Progress Notes (Signed)
PROGRESS NOTE    Max Nuno  PPJ:[id number] DOB: 02/05/1951 DOA: 09/19/2017 PCP: [name], MD    Brief Narrative:  67 y.o.malewithhistory of advanced dementia, schizophrenia, hypertension was brought from the skilled nursing facility after patient was found to have persistent nausea vomiting with hematemesis. Chest x-ray done at this nursing facility was showing some infiltrates and abdomen x-ray was showing concerning for bowel obstruction.  In the ER stool for occult blood was positive. Abdomen was distended and initially patient on arrival was confused and minimally responsive but somewhat improved after starting antibiotics and fluids. CT abdomen pelvis shows bowel obstruction with transition point. Chest x-ray was showing possibility of infiltrates. Patient was hypotensive improved with fluids and empiric antibiotic started for sepsis likely from aspiration and found to have a UTI as well.   Underwent Laparoscopic Lysis of Adhesions for SBO and improved but continued to be Encephalopathic and not following commands. Because of his AMS an MRI and EEG were ordered and Neurology was consulted to evaluate and make recommendations  Assessment & Plan:   Principal Problem:   SBO (small bowel obstruction) s/p lap adhesiolysis 09/22/2017 Active Problems:   Schizophrenia ([place])   Malnutrition of moderate degree (HCC)   Acute respiratory failure with hypoxia (HCC)   ARF (acute renal failure) (HCC)   Hematemesis   Sepsis ([place])   Anxiety state   Small bowel obstruction (HCC)   Hypomagnesemia   Acute lower UTI   Acute metabolic encephalopathy   Ileus (HCC)   Volume overload   Hypokalemia  High-grade small bowel obstruction that is post laparoscopic lysis of adhesions 09/22/2017 with subsequent Ileus -Pt presented with small bowel obstruction secondary to adhesions.  -Patient status post laparoscopic lysis of adhesions 09/22/2017 per [name].  -Patient had ileus  with no bowel movements and no flatus and hypoactive bowel sounds as of 09/23/2017.  -Patient initially started on clears with plans to advance diet as tolerated -Patient noted to have concerns for depressed consciousness and even seizure activity.  Patient subsequent transferred from [facility] hospital to [facility].  See below -Continue to aim to keep magnesium greater than 2. Keep potassium greater than 4.  -Potassium improved. Repeat bmet in AM, continue to correct electrolytes as needed  Acute respiratory failure with hypoxia secondary to volume overload -Patient noted to have worsening shortness of breaththe night of 09/23/2017 to the morning of 09/24/2017,with increased O2 requirements requiring 8 L of high flow nasal cannula.  -follow up chest x-ray obtained consistent with volume overload.   -Patient had been continued on IV Lasix, now held given concerns of dehydration. -We will continue to monitor ins and outs closely.  Acute Metabolic Encephalopathy in the setting of Advanced Dementia -Suspicion for medication induced cephalopathy. -Obtain Head CT w/o Contrast: Showed No acute intracranial abnormality. Mild cerebral atrophy. Chronic sinusitis. -Appreciate input by palliative care service.  Overall prognosis seems to be poor at this time. -Continue NG tube placed for tube feeds.  -Concerns for possible continued seizure activity. Discussed with Neurology. AEDs titrated by Neurology. Continue to monitor.  Initial plans for burst suppression in ICU, now plans for gradually weaning sedating meds and trying to allow pt to wake per Neurology -Ammonia level elevated. Started BID lactulose per tube. -Ammonia remains elevated. Will increase lactulose to TID dosing -also noted to have mild hypernatremia. Increased free-water flush to 200cc TID  Acute Renal Failure -Likely secondary to prerenal azotemia in the setting of ACE inhibitor.  -Patient  noted on admission to be  hypotensive.  -Patient hydrated aggressively with IV fluids. Renal function improved and currently at baseline noted. -recheck bmet in AM  Sepsis likely secondary to aspiration pneumonia and Diphtheroids UTI -Per CT abdomen and pelvis as well as chest x-ray concerning for probable pneumonia.  -Patient on admission noted to be septic with hypotension, acute renal failure, tachycardia, elevated lactic acid level, elevated procalcitonin level.  -Lactic acid level and pro calcitonin levels trending down.  -Urine cultures with >100000 colonies of Diphtheroids.  -Blood cultures pending with no growth to date. IV vancomycin was discontinued.  -completed course of IV Zosyn -recheck cbc in AM  Anemia/Hematemesis -Patient on admission was noted to have some hematemesis however no hematemesis noted during the hospitalization.  -Patient with no overt bleeding.  -Anemia panel has been checked and iron level at 11. Ferritin at 142. Vitamin B12 levels at 282.  -Continue vitamin B12 IM injections.  -Hb/Hct now stable at 10.0/30.2 -Patient s/pIV Feraheme.  -Follow H&H, transfusion threshold hemoglobin less than 8.  -Will likely need oral iron supplementation on discharge.  -Continue PPI as tolerated -Repeat CBC profile in AM  Hypertension -On admission patient noted to be septic with hypotension.  -Blood pressure improved, stable  -Antihypertensive medications on hold secondary to problem #1.  -Continue IV Lopressor and IV Hydralazine.  -Diuretics were discontinued as noted above.  Schizophrenia -Patient had been on IV Depakote 250 mg q8h.  -Patient remains poorly responsive this AM 67  Hypomagnesemia -Labs reviewed. Recent Magnesium within normal limits -continue to follow  Hypernatremia -worsening -Have increased free water flushes to 200cc TID -follow serial sodium levels -If still high, consider adding hypotonic fluids  Hypokalemia -Improved after multiple  doses of KCl -Will repeat basic metabolic panel in morning  Abnormal LFTs -Patient's AST was 5 and ALT was 66 -Possibly in the setting of surgery -Follow LFT's  Hyperbilirubinemia -Mild at 1.3 -Will continue to follow for now  Thrombocytosis -Likely reactive in the setting of aspiration infection along with UTI -Stable at present -Recheck CBC in AM  DVT prophylaxis: SCDs Code Status: DO NOT RESUSCITATE/DO NOT INTUBATE Family Communication: Patient in room Disposition Plan: Uncertain at this time  Consultants:   [place]   Neurology Dr. Cheral Marker  Procedures:   Patient status post lysis of adhesions  Antimicrobials: Anti-infectives (From admission, onward)   Start     Dose/Rate Route Frequency Ordered Stop   09/22/17 1000  cefoTEtan (CEFOTAN) 2 g in sodium chloride 0.9 % 100 mL IVPB     2 g 200 mL/hr over 30 Minutes Intravenous On call to O.R. 09/22/17 0852 09/22/17 1600   09/21/17 1700  vancomycin (VANCOCIN) IVPB 750 mg/150 ml premix  Status:  Discontinued     750 mg 150 mL/hr over 60 Minutes Intravenous Every 12 hours 09/21/17 0956 09/22/17 0959   09/21/17 0600  vancomycin (VANCOCIN) IVPB 1000 mg/200 mL premix  Status:  Discontinued     1,000 mg 200 mL/hr over 60 Minutes Intravenous Every 24 hours 09/20/17 0628 09/20/17 0921   09/21/17 0600  vancomycin (VANCOCIN) IVPB 750 mg/150 ml premix  Status:  Discontinued     750 mg 150 mL/hr over 60 Minutes Intravenous Every 24 hours 09/20/17 0921 09/21/17 0956   09/20/17 0800  piperacillin-tazobactam (ZOSYN) IVPB 3.375 g  Status:  Discontinued     3.375 g 12.5 mL/hr over 240 Minutes Intravenous Every 8 hours 09/20/17 0612 09/29/17 1708   09/20/17 0130  vancomycin (  VANCOCIN) 1,500 mg in sodium chloride 0.9 % 500 mL IVPB     1,500 mg 250 mL/hr over 120 Minutes Intravenous  Once 09/20/17 0100 09/20/17 0453   09/20/17 0100  piperacillin-tazobactam (ZOSYN) IVPB 3.375 g     3.375 g 100 mL/hr over 30  Minutes Intravenous  Once 09/20/17 0047 09/20/17 0214   09/20/17 0100  vancomycin (VANCOCIN) IVPB 1000 mg/200 mL premix  Status:  Discontinued     1,000 mg 200 mL/hr over 60 Minutes Intravenous  Once 09/20/17 0047 09/20/17 0100      Subjective: Unable to obtain from patient given lethargy  Objective: Vitals:   10/02/17 0339 10/02/17 0827 10/02/17 0829 10/02/17 1113  BP: (!) 152/67  (!) 159/73 (!) 121/55  Pulse: 91 83 83 82  Resp: 18  20 20   Temp: 98.3 F (36.8 C)  97.6 F (36.4 C) 98.3 F (36.8 C)  TempSrc: Axillary  Oral Axillary  SpO2: 90% 97% 97% 93%  Weight:      Height:        Intake/Output Summary (Last 24 hours) at 10/02/2017 1634 Last data filed at 10/02/2017 1406 Gross per 24 hour  Intake 1461.33 ml  Output 800 ml  Net 661.33 ml   Filed Weights   09/29/17 0411 09/30/17 0447 10/02/17 0327  Weight: 72.8 kg (160 lb 7.9 oz) 75.5 kg (166 lb 7.2 oz) 75.9 kg (167 lb 5.3 oz)    Examination: General exam: Appears lethargic, in no acute distress Respiratory system: normal chest rise, clear, no audible wheezing Cardiovascular system: regular rhythm, s1-s2 Gastrointestinal system: Nondistended, nontender, pos BS Central nervous system: No active seizures, no tremors Extremities: No cyanosis, no joint deformities Skin: No rashes, no pallor Psychiatry: Cannot obtain given patient's current mental state   Data Reviewed: I have personally reviewed following labs and imaging studies  CBC: Recent Labs  Lab 09/26/17 0259 09/27/17 0312  WBC 17.7* 16.2*  NEUTROABS  --  11.6*  HGB 9.4* 10.0*  HCT 27.9* 30.2*  MCV 91.2 92.6  PLT 408* 254*   Basic Metabolic Panel: Recent Labs  Lab 09/26/17 0259 09/27/17 0312 09/29/17 0922 09/30/17 1007 10/01/17 0440 10/02/17 0519 10/02/17 0921 10/02/17 1448  NA 147* 145 146* 148* 150* 152* 150* 151*  K 3.3* 3.4* 3.2* 3.4* 3.3* 3.8  --   --   CL 103 99* 107 111 109 117*  --   --   CO2 35* 34* 31 29 31 30   --   --   GLUCOSE  101* 101* 150* 137* 104* 121*  --   --   BUN 21* 31* 30* 25* 26* 30*  --   --   CREATININE 1.03 1.10 0.96 0.77 0.84 0.73  --   --   CALCIUM 8.6* 8.7* 7.9* 7.8* 8.3* 8.4*  --   --   MG 2.3 2.1 2.3  --   --  2.3  --   --   PHOS  --  3.8  --   --   --   --   --   --    GFR: Estimated Creatinine Clearance: 94.1 mL/min (by C-G formula based on SCr of 0.73 mg/dL). Liver Function Tests: Recent Labs  Lab 09/27/17 0312 09/30/17 1007  AST 65* 30  ALT 66* 44  ALKPHOS 92 84  BILITOT 1.3* 0.4  PROT 6.5 5.4*  ALBUMIN 2.9* 2.4*   No results for input(s): LIPASE, AMYLASE in the last 168 hours. Recent Labs  Lab 09/28/17 0526 10/01/17 1030  10/02/17 0804  AMMONIA 68* 176* 142*   Coagulation Profile: No results for input(s): INR, PROTIME in the last 168 hours. Cardiac Enzymes: No results for input(s): CKTOTAL, CKMB, CKMBINDEX, TROPONINI in the last 168 hours. BNP (last 3 results) No results for input(s): PROBNP in the last 8760 hours. HbA1C: No results for input(s): HGBA1C in the last 72 hours. CBG: Recent Labs  Lab 10/01/17 2018 10/02/17 0145 10/02/17 0440 10/02/17 0838 10/02/17 1113  GLUCAP 121* 104* 130* 110* 94   Lipid Profile: No results for input(s): CHOL, HDL, LDLCALC, TRIG, CHOLHDL, LDLDIRECT in the last 72 hours. Thyroid Function Tests: Recent Labs    10/01/17 1030  TSH 1.786   Anemia Panel: Recent Labs    10/01/17 1030  VITAMINB12 1,085*   Sepsis Labs: No results for input(s): PROCALCITON, LATICACIDVEN in the last 168 hours.  No results found for this or any previous visit (from the past 240 hour(s)).   Radiology Studies: Mr Brain Wo Contrast  Result Date: 10/02/2017 CLINICAL DATA:  Initial evaluation for acute altered mental status, seizure. EXAM: MRI HEAD WITHOUT CONTRAST TECHNIQUE: Multiplanar, multiecho pulse sequences of the brain and surrounding structures were obtained without intravenous contrast. COMPARISON:  Prior MRI from 09/27/2017 FINDINGS:  Brain: Generalized age-related cerebral atrophy. Few scattered patchy T2/FLAIR hyperintensities within the periventricular and deep white matter both cerebral hemispheres, nonspecific, but felt to be within normal limits for age. No abnormal foci of restricted diffusion to suggest acute or subacute ischemia. Gray-white matter differentiation maintained. No changes related to acute seizure identified. Probable small remote left cerebellar infarct noted (series 10, image 6). No other evidence for chronic ischemia. No foci of susceptibility artifact to suggest acute or chronic intracranial hemorrhage. No mass lesion, midline shift or mass effect. No hydrocephalus. No extra-axial fluid collection. Incidental note made of a partially empty sella. Vascular: Artery, which may be occluded (series 10, image 3). Right vertebral artery diminutive but grossly patent as is the basilar artery. Anterior circulation vascular flow voids well maintained. Within the left vertebral Skull and upper cervical spine: Craniocervical junction within normal limits. Bone marrow signal intensity normal. No scalp soft tissue abnormality. Sinuses/Orbits: Globes and orbital soft tissues within normal limits. Chronic left maxillary sinusitis noted. Superimposed air-fluid levels suggest acute sinus disease. Bilateral mastoid effusions noted. Other: None. IMPRESSION: 1. No acute intracranial abnormality. 2. Small remote left cerebellar infarct. 3. 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 Overall vertebrobasilar system is diminutive, with suspected predominant fetal type origin of the PCAs. 4. Acute on chronic left maxillary sinusitis. Electronically Signed   By: Jeannine Boga M.D.   On: 10/02/2017 03:56    Scheduled Meds: . bisacodyl  10 mg Rectal Daily  . feeding supplement (PRO-STAT SUGAR FREE 64)  30 mL Per Tube BID  . free water  200 mL Per Tube Q8H  . lactulose  30 g Per Tube TID  . mouth rinse  15 mL  Mouth Rinse BID  . metoprolol tartrate  5 mg Intravenous Q8H  . polyethylene glycol  17 g Oral BID  . sorbitol, milk of mag, mineral oil, glycerin (SMOG) enema  960 mL Rectal Once   Continuous Infusions: . chlorproMAZINE (THORAZINE) IV    . famotidine (PEPCID) IV Stopped (10/02/17 1406)  . feeding supplement (OSMOLITE 1.5 CAL) 1,000 mL (10/02/17 0326)  . lacosamide (VIMPAT) IV Stopped (10/02/17 1406)  . levETIRAcetam Stopped (10/02/17 0629)  . methocarbamol (ROBAXIN)  IV Stopped (09/26/17 0030)  LOS: 12 days   Marylu Lund, MD [facility] Pager [phone]  If 7PM-7AM, please contact night-coverage [email or web address] Password [facility] 10/02/2017, 4:34 PM

## 2017-10-02 NOTE — Progress Notes (Signed)
RT contacted by RN to NTS patient. Upon arrival, patient appears to be in no apparent distress. Patient breath sounds are clear and all vitals are within normal limits. RT will continue to monitor.

## 2017-10-02 NOTE — Progress Notes (Signed)
Pt went down for MRI. RN went down for safety for aspiration precautions.

## 2017-10-02 NOTE — Progress Notes (Signed)
Patient IV site noted to be more edematous, IV Fluids stopped, IV team paged to assess for infiltration. Noted client to have labored breathing, RR 36, ABD breathing noted, rales heard in all lobes, notified physician of changes in status, new orders received.

## 2017-10-02 NOTE — Progress Notes (Signed)
CSW following for discharge plan.  Laveda Abbe, [place] Clinical Social Worker [phone]

## 2017-10-02 NOTE — Progress Notes (Signed)
Neurology Progress Note   S:// Seen and examined.  No acute overnight events.   O:// Current vital signs: BP (!) 159/73 (BP Location: Left Arm)   Pulse 83   Temp 97.6 F (36.4 C) (Oral)   Resp 20   Ht 5\' 11"  (1.803 m)   Wt 75.9 kg (167 lb 5.3 oz)   SpO2 97%   BMI 23.34 kg/m  Vital signs in last 24 hours: Temp:  [97.6 F (36.4 C)-98.9 F (37.2 C)] 97.6 F (36.4 C) (06/11 0829) Pulse Rate:  [83-95] 83 (06/11 0829) Resp:  [18-20] 20 (06/11 0829) BP: (126-159)/(67-105) 159/73 (06/11 0829) SpO2:  [90 %-97 %] 97 % (06/11 0829) Weight:  [75.9 kg (167 lb 5.3 oz)] 75.9 kg (167 lb 5.3 oz) (06/11 0327) General: Flare elderly man in no apparent distress CVS: S1-S2 regular rate rhythm HEENT: NG tube in place, no cephalic, atraumatic Respiratory: Slightly rhonchorous breathing Neurological exam Mental status: Obtunded does not open eyes to voice or noxious stim Does not follow any commands Nonverbal Cranial nerves: Pupils 6 mm reactive down to 3 mm bilaterally with light, symmetric face. Motor exam: No spontaneous movements Does not move any extremity to command. Sensory: No response to pain, no localization.  Does not respond to sternal rub.  Flaccid in all fours. DTRs: Mute all over.  Medications  Current Facility-Administered Medications:  .  [DISCONTINUED] acetaminophen (TYLENOL) tablet 650 mg, 650 mg, Oral, Q6H PRN **OR** acetaminophen (TYLENOL) suppository 650 mg, 650 mg, Rectal, Q6H PRN, Michael Boston, MD, 650 mg at 09/24/17 1711 .  bisacodyl (DULCOLAX) suppository 10 mg, 10 mg, Rectal, Daily, Michael Boston, MD, 10 mg at 10/01/17 1100 .  chlorproMAZINE (THORAZINE) 25 mg in sodium chloride 0.9 % 25 mL IVPB, 25 mg, Intravenous, Q6H PRN, Michael Boston, MD .  famotidine (PEPCID) IVPB 20 mg premix, 20 mg, Intravenous, Q12H, Michael Boston, MD, Stopped at 10/02/17 0022 .  feeding supplement (OSMOLITE 1.5 CAL) liquid 1,000 mL, 1,000 mL, Per Tube, Continuous, Donne Hazel, MD,  Last Rate: 50 mL/hr at 10/02/17 0326, 1,000 mL at 10/02/17 0326 .  feeding supplement (PRO-STAT SUGAR FREE 64) liquid 30 mL, 30 mL, Per Tube, BID, Donne Hazel, MD, 30 mL at 10/01/17 2322 .  free water 200 mL, 200 mL, Per Tube, Q8H, Donne Hazel, MD .  hydrALAZINE (APRESOLINE) injection 10 mg, 10 mg, Intravenous, Q4H PRN, Michael Boston, MD, 10 mg at 09/27/17 1615 .  lacosamide (VIMPAT) 200 mg in sodium chloride 0.9 % 25 mL IVPB, 200 mg, Intravenous, Q12H, Kerney Elbe, MD, Stopped at 10/01/17 2349 .  lactulose (CHRONULAC) 10 GM/15ML solution 30 g, 30 g, Per Tube, TID, Donne Hazel, MD .  levETIRAcetam (KEPPRA) IVPB 1500 mg/ 100 mL premix, 1,500 mg, Intravenous, Q12H, [place], Lanice Schwab, MD, Stopped at 10/02/17 8299 .  MEDLINE mouth rinse, 15 mL, Mouth Rinse, BID, Michael Boston, MD, 15 mL at 10/01/17 2328 .  methocarbamol (ROBAXIN) 1,000 mg in dextrose 5 % 50 mL IVPB, 1,000 mg, Intravenous, Q6H PRN, Michael Boston, MD, Stopped at 09/26/17 0030 .  metoprolol tartrate (LOPRESSOR) injection 5 mg, 5 mg, Intravenous, Q8H, Eugenie Filler, MD, 5 mg at 10/02/17 [phone] .  morphine 2 MG/ML injection 0.5-1 mg, 0.5-1 mg, Intravenous, Q4H PRN, Eugenie Filler, MD, 0.5 mg at 09/25/17 2156 .  ondansetron (ZOFRAN) tablet 4 mg, 4 mg, Oral, Q6H PRN **OR** ondansetron (ZOFRAN) injection 4 mg, 4 mg, Intravenous, Q6H PRN, Michael Boston, MD, 4 mg at 09/25/17  8182 .  polyethylene glycol (MIRALAX / GLYCOLAX) packet 17 g, 17 g, Oral, BID, Ralene Ok, MD, 17 g at 10/01/17 2322 .  prochlorperazine (COMPAZINE) injection 5-10 mg, 5-10 mg, Intravenous, Q4H PRN, Michael Boston, MD, 5 mg at 09/25/17 0408 .  sorbitol, milk of mag, mineral oil, glycerin (SMOG) enema, 960 mL, Rectal, Once, Gross, Remo Lipps, MD .  valproate (DEPACON) 750 mg in dextrose 5 % 50 mL IVPB, 750 mg, Intravenous, Q8H, Kerney Elbe, MD, Last Rate: 57.5 mL/hr at 10/02/17 0629, 750 mg at 10/02/17 0629 Labs CBC    Component Value Date/Time   WBC  16.2 (H) 09/27/2017 0312   RBC 3.26 (L) 09/27/2017 0312   HGB 10.0 (L) 09/27/2017 0312   HCT 30.2 (L) 09/27/2017 0312   PLT 476 (H) 09/27/2017 0312   MCV 92.6 09/27/2017 0312   MCH 30.7 09/27/2017 0312   MCHC 33.1 09/27/2017 0312   RDW 14.7 09/27/2017 0312   LYMPHSABS 2.1 09/27/2017 0312   MONOABS 2.1 (H) 09/27/2017 0312   EOSABS 0.2 09/27/2017 0312   BASOSABS 0.2 (H) 09/27/2017 0312    CMP     Component Value Date/Time   NA 150 (H) 10/02/2017 0921   NA 137 08/27/2015   K 3.8 10/02/2017 0519   CL 117 (H) 10/02/2017 0519   CO2 30 10/02/2017 0519   GLUCOSE 121 (H) 10/02/2017 0519   BUN 30 (H) 10/02/2017 0519   BUN 16 08/27/2015   CREATININE 0.73 10/02/2017 0519   CALCIUM 8.4 (L) 10/02/2017 0519   PROT 5.4 (L) 09/30/2017 1007   ALBUMIN 2.4 (L) 09/30/2017 1007   AST 30 09/30/2017 1007   ALT 44 09/30/2017 1007   ALKPHOS 84 09/30/2017 1007   BILITOT 0.4 09/30/2017 1007   GFRNONAA >60 10/02/2017 0519   GFRAA >60 10/02/2017 0519  UA negative Ammonia was 176 yesterday, 142 today. Sodium is 150   Imaging I have reviewed images in epic and the results pertinent to this consultation are: MRI of the brain on 09/27/2017 and yesterday-both unremarkable. Long-term video EEG monitoring day 4 revealed no seizures.  LTM was discontinued yesterday.  Assessment:  67 year old man with advanced dementia, schizophrenia, episodes of confusion, underwent small bowel suction surgery on 09/22/2017 and became encephalopathic and continued to be in several pathic.  EEG on 09/27/2017 showed a 5-minute seizure on the EEG without any clinical event concerning for nonconvulsive status epilepticus.  Since the patient had DNR orders prior to hospital arrival, and as part of state, he was treated with typical antiepileptics without pursuing burst suppression. Currently, he also has severely elevated ammonia levels. They are being treated with lactulose. He currently is on valproate, Keppra, Vimpat I think  that the upper 90s attribute into his deranged liver function. No electrographic evidence of seizure on the last difficulty monitoring. He also has multiple metabolic derangements including hyperammonemia, hyponatremia.  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 Repeat brain MRI normal limits Minimize sedating meds Lactulose for hyperammonemia We will follow with you.  -- Amie Portland, MD [facility] Pager: [phone] If 7pm to 7am, please call on call as listed on AMION.

## 2017-10-03 DIAGNOSIS — E7220 Disorder of urea cycle metabolism, unspecified: Secondary | ICD-10-CM

## 2017-10-03 LAB — GLUCOSE, CAPILLARY
GLUCOSE-CAPILLARY: 119 mg/dL — AB (ref 65–99)
GLUCOSE-CAPILLARY: 92 mg/dL (ref 65–99)
GLUCOSE-CAPILLARY: 97 mg/dL (ref 65–99)
Glucose-Capillary: 105 mg/dL — ABNORMAL HIGH (ref 65–99)
Glucose-Capillary: 122 mg/dL — ABNORMAL HIGH (ref 65–99)
Glucose-Capillary: 91 mg/dL (ref 65–99)
Glucose-Capillary: 93 mg/dL (ref 65–99)

## 2017-10-03 LAB — AMMONIA: Ammonia: 52 umol/L — ABNORMAL HIGH (ref 9–35)

## 2017-10-03 LAB — BASIC METABOLIC PANEL
Anion gap: 5 (ref 5–15)
BUN: 32 mg/dL — AB (ref 6–20)
CO2: 28 mmol/L (ref 22–32)
Calcium: 8.6 mg/dL — ABNORMAL LOW (ref 8.9–10.3)
Chloride: 118 mmol/L — ABNORMAL HIGH (ref 101–111)
Creatinine, Ser: 0.81 mg/dL (ref 0.61–1.24)
GFR calc Af Amer: >60 mL/min (ref >60)
GLUCOSE: 98 mg/dL (ref 65–99)
POTASSIUM: 4.3 mmol/L (ref 3.5–5.1)
Sodium: 151 mmol/L — ABNORMAL HIGH (ref 135–145)

## 2017-10-03 LAB — CBC
HEMATOCRIT: 30.9 % — AB (ref 39.0–52.0)
Hemoglobin: 9.5 g/dL — ABNORMAL LOW (ref 13.0–17.0)
MCH: 30.8 pg (ref 26.0–34.0)
MCHC: 30.7 g/dL (ref 30.0–36.0)
MCV: 100.3 fL — AB (ref 78.0–100.0)
Platelets: 358 10*3/uL (ref 150–400)
RBC: 3.08 MIL/uL — ABNORMAL LOW (ref 4.22–5.81)
RDW: 16.4 % — AB (ref 11.5–15.5)
WBC: 23.8 10*3/uL — ABNORMAL HIGH (ref 4.0–10.5)

## 2017-10-03 MED ORDER — ALBUMIN HUMAN 25 % IV SOLN
25.0000 g | Freq: Four times a day (QID) | INTRAVENOUS | Status: DC
  Administered 2017-10-03: 25 g via INTRAVENOUS
  Filled 2017-10-03: qty 100

## 2017-10-03 MED ORDER — ALBUMIN HUMAN 25 % IV SOLN
25.0000 g | Freq: Four times a day (QID) | INTRAVENOUS | Status: DC
  Administered 2017-10-04: 25 g via INTRAVENOUS
  Filled 2017-10-03 (×3): qty 100

## 2017-10-03 MED ORDER — DEXTROSE 5 % IV SOLN
INTRAVENOUS | Status: DC
  Administered 2017-10-03 – 2017-10-04 (×4): via INTRAVENOUS

## 2017-10-03 MED ORDER — ALBUMIN HUMAN 25 % IV SOLN: 25.0000 g | Freq: Four times a day (QID) | INTRAVENOUS | Status: DC

## 2017-10-03 MED ORDER — ALBUMIN HUMAN 25 % IV SOLN
25.0000 g | Freq: Four times a day (QID) | INTRAVENOUS | Status: DC
  Filled 2017-10-03 (×2): qty 100

## 2017-10-03 MED ORDER — LACTULOSE ENEMA
300.0000 mL | Freq: Two times a day (BID) | ORAL | Status: DC
  Administered 2017-10-03 – 2017-10-08 (×11): 300 mL via RECTAL
  Filled 2017-10-03 (×14): qty 300

## 2017-10-03 MED ORDER — ALBUMIN HUMAN 25 % IV SOLN
25.0000 g | Freq: Four times a day (QID) | INTRAVENOUS | Status: AC
  Administered 2017-10-03 – 2017-10-04 (×4): 25 g via INTRAVENOUS
  Filled 2017-10-03 (×4): qty 100

## 2017-10-03 NOTE — Progress Notes (Signed)
Neurology Progress Note   S:// Patient seen and examined.  No seizure activity.  O:// Current vital signs: BP 137/70 (BP Location: Left Arm)   Pulse 80   Temp 97.7 F (36.5 C) (Oral)   Resp 20   Ht 5\' 11"  (1.803 m)   Wt 75.9 kg (167 lb 5.3 oz)   SpO2 96%   BMI 23.34 kg/m  Vital signs in last 24 hours: Temp:  [97.5 F (36.4 C)-99.6 F (37.6 C)] 97.7 F (36.5 C) (06/12 0410) Pulse Rate:  [78-89] 80 (06/12 0410) Resp:  [16-36] 20 (06/12 0410) BP: (121-159)/(55-87) 137/70 (06/12 0410) SpO2:  [92 %-100 %] 96 % (06/12 0410) General: frail elderly man in no apparent distress CVS: S1-S2 regular rate rhythm HEENT: NG tube in place, no cephalic, atraumatic Respiratory: Slightly rhonchorous breathing Neurological exam Mental status: Obtunded does not open eyes to voice or noxious stim Does not follow any commands Nonverbal Cranial nerves: Pupils 6 mm reactive down to 3 mm bilaterally with light, symmetric face.  No gag.  No cough.  Corneal reflexes present.  Does not respond to noxious stimulus from either visual field or upfront. Motor exam: No spontaneous movements Does not move any extremity to command. Sensory: No response to pain, no localization.  Does not respond to sternal rub.  Flaccid in all fours. DTRs: Mute all over.  Medications  Current Facility-Administered Medications:  .  [DISCONTINUED] acetaminophen (TYLENOL) tablet 650 mg, 650 mg, Oral, Q6H PRN **OR** acetaminophen (TYLENOL) suppository 650 mg, 650 mg, Rectal, Q6H PRN, Michael Boston, MD, 650 mg at 09/24/17 1711 .  bisacodyl (DULCOLAX) suppository 10 mg, 10 mg, Rectal, Daily, Michael Boston, MD, 10 mg at 10/02/17 1208 .  chlorproMAZINE (THORAZINE) 25 mg in sodium chloride 0.9 % 25 mL IVPB, 25 mg, Intravenous, Q6H PRN, Michael Boston, MD .  famotidine (PEPCID) IVPB 20 mg premix, 20 mg, Intravenous, Q12H, Michael Boston, MD, Stopped at 10/02/17 2321 .  feeding supplement (OSMOLITE 1.5 CAL) liquid 1,000 mL, 1,000 mL,  Per Tube, Continuous, Donne Hazel, MD, Last Rate: 50 mL/hr at 10/02/17 0326, 1,000 mL at 10/02/17 0326 .  feeding supplement (PRO-STAT SUGAR FREE 64) liquid 30 mL, 30 mL, Per Tube, BID, Donne Hazel, MD, 30 mL at 10/02/17 1208 .  free water 200 mL, 200 mL, Per Tube, Q8H, Donne Hazel, MD, 200 mL at 10/02/17 1400 .  hydrALAZINE (APRESOLINE) injection 10 mg, 10 mg, Intravenous, Q4H PRN, Michael Boston, MD, 10 mg at 09/27/17 1615 .  lacosamide (VIMPAT) 200 mg in sodium chloride 0.9 % 25 mL IVPB, 200 mg, Intravenous, Q12H, Kerney Elbe, MD, Stopped at 10/02/17 2320 .  lactulose (CHRONULAC) 10 GM/15ML solution 30 g, 30 g, Per Tube, TID, Donne Hazel, MD, 30 g at 10/02/17 1703 .  levETIRAcetam (KEPPRA) IVPB 1500 mg/ 100 mL premix, 1,500 mg, Intravenous, Q12H, Aroor, Lanice Schwab, MD, Stopped at 10/03/17 0754 .  MEDLINE mouth rinse, 15 mL, Mouth Rinse, BID, Michael Boston, MD, 15 mL at 10/03/17 5462 .  methocarbamol (ROBAXIN) 1,000 mg in dextrose 5 % 50 mL IVPB, 1,000 mg, Intravenous, Q6H PRN, Michael Boston, MD, Stopped at 09/26/17 0030 .  metoprolol tartrate (LOPRESSOR) injection 5 mg, 5 mg, Intravenous, Q8H, Eugenie Filler, MD, 5 mg at 10/03/17 0544 .  morphine 2 MG/ML injection 0.5-1 mg, 0.5-1 mg, Intravenous, Q4H PRN, Eugenie Filler, MD, 0.5 mg at 09/25/17 2156 .  ondansetron (ZOFRAN) tablet 4 mg, 4 mg, Oral, Q6H PRN **OR** ondansetron (ZOFRAN)  injection 4 mg, 4 mg, Intravenous, Q6H PRN, Michael Boston, MD, 4 mg at 09/25/17 0525 .  piperacillin-tazobactam (ZOSYN) IVPB 3.375 g, 3.375 g, Intravenous, Q8H, Hammons, Kimberly B, RPH, Last Rate: 12.5 mL/hr at 10/03/17 0533, 3.375 g at 10/03/17 0533 .  polyethylene glycol (MIRALAX / GLYCOLAX) packet 17 g, 17 g, Oral, BID, Ralene Ok, MD, 17 g at 10/02/17 1208 .  prochlorperazine (COMPAZINE) injection 5-10 mg, 5-10 mg, Intravenous, Q4H PRN, Michael Boston, MD, 5 mg at 09/25/17 0408 .  sorbitol, milk of mag, mineral oil, glycerin (SMOG)  enema, 960 mL, Rectal, Once, Michael Boston, MD Labs CBC    Component Value Date/Time   WBC 23.8 (H) 10/03/2017 0423   RBC 3.08 (L) 10/03/2017 0423   HGB 9.5 (L) 10/03/2017 0423   HCT 30.9 (L) 10/03/2017 0423   PLT 358 10/03/2017 0423   MCV 100.3 (H) 10/03/2017 0423   MCH 30.8 10/03/2017 0423   MCHC 30.7 10/03/2017 0423   RDW 16.4 (H) 10/03/2017 0423   LYMPHSABS 2.1 09/27/2017 0312   MONOABS 2.1 (H) 09/27/2017 0312   EOSABS 0.2 09/27/2017 0312   BASOSABS 0.2 (H) 09/27/2017 0312    CMP     Component Value Date/Time   NA 151 (H) 10/03/2017 0423   NA 137 08/27/2015   K 4.3 10/03/2017 0423   CL 118 (H) 10/03/2017 0423   CO2 28 10/03/2017 0423   GLUCOSE 98 10/03/2017 0423   BUN 32 (H) 10/03/2017 0423   BUN 16 08/27/2015   CREATININE 0.81 10/03/2017 0423   CALCIUM 8.6 (L) 10/03/2017 0423   PROT 5.4 (L) 09/30/2017 1007   ALBUMIN 2.4 (L) 09/30/2017 1007   AST 30 09/30/2017 1007   ALT 44 09/30/2017 1007   ALKPHOS 84 09/30/2017 1007   BILITOT 0.4 09/30/2017 1007   GFRNONAA >60 10/03/2017 0423   GFRAA >60 10/03/2017 0423   Imaging I have reviewed images in epic and the results pertinent to this consultation are: MRI examination of the brain from 09/27/2017 and 10/02/2017 are both unremarkable for any acute process.  Long-term EEG initially showed nonconvulsive status epilepticus but by day for heart no evidence of seizure activity.  Assessment:  67 year old man with a past medical history of advanced dementia, schizophrenia, who has been altered since his bowel surgery on 09/22/2017 was noted to have nonconvulsive status of elliptical is on EEG on 09/27/2017.  4 days of extensive long-term video EEG monitoring showed control of those with antiepileptics. He continues to have hyponatremia as well as hyperammonemia.  Hyperammonemia might be attributable to the valproate, which has been discontinued since yesterday. His exam continues to be very poor.  He also has persistent elevation in  his white count, which might be concerning for an underlying infection especially with the small bowel surgery or could be aspiration due to his poor mental status.  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 I do not suspect that he is currently nonconvulsive status epilepticus based on exam. Multifactorial toxic metabolic encephalopathy  Recommendations: Continue Keppra 1500 twice daily Continue Vimpat 200 twice daily Ativan as needed if clinical seizure activity noted Minimize sedating medications No ammonia levels available for today.  Recheck ammonia levels. Treatment of hyperammonemia per primary team  We will continue to follow with you   -- Amie Portland, MD [facility] Pager: [phone] If 7pm to 7am, please call on call as listed on AMION.

## 2017-10-03 NOTE — Progress Notes (Addendum)
Palliative:  [name] continues to be unresponsive today. Lungs sound better. Awaiting ammonia level results. I am still awaiting return call from [facility]PS and if no call back I will call again tomorrow. Also giving time for ammonia levels to decrease to see if mental status will improve.   I discussed with Dr. Grandville Silos who confirms that Kevin Humphrey was previously communicating and doing fairly well earlier in hospitalization. Plan to reassess alertness as ammonia levels decrease. I fear that he has had many complications during this hospitalization on top of his chronic co-morbidities that could lead to continued decline. With this I continue to recommend low threshold for transition to comfort care.   Exam: Unresponsive, lungs clear today but breathes shallow  15 min  Vinie Sill, NP Palliative Medicine Team Pager # [phone] (M-F 8a-5p) Team Phone # [phone] (Nights/Weekends)

## 2017-10-03 NOTE — Progress Notes (Signed)
PROGRESS NOTE    Kevin Humphrey  MGQ:[id number] DOB: 1951-01-28 DOA: 09/19/2017 PCP: [name], MD    Brief Narrative:  67 y.o.malewithhistory of advanced dementia, schizophrenia, hypertension was brought from the skilled nursing facility after patient was found to have persistent nausea vomiting with hematemesis. Chest x-ray done at this nursing facility was showing some infiltrates and abdomen x-ray was showing concerning for bowel obstruction.  In the ER stool for occult blood was positive. Abdomen was distended and initially patient on arrival was confused and minimally responsive but somewhat improved after starting antibiotics and fluids. CT abdomen pelvis shows bowel obstruction with transition point. Chest x-ray was showing possibility of infiltrates. Patient was hypotensive improved with fluids and empiric antibiotic started for sepsis likely from aspiration and found to have a UTI as well.   Underwent Laparoscopic Lysis of Adhesions for SBO and improved butcontinued to beEncephalopathic and not following commands. Because of his AMS an MRI and EEG were ordered and Neurology was consulted to evaluate and make recommendations      Assessment & Plan:   Principal Problem:   Acute metabolic encephalopathy Active Problems:   Acute respiratory failure with hypoxia (HCC)   Volume overload   GERD (gastroesophageal reflux disease)   Schizophrenia (HCC)   Malnutrition of moderate degree (HCC)   SBO (small bowel obstruction) s/p lap adhesiolysis 09/22/2017   ARF (acute renal failure) (HCC)   Hematemesis   Sepsis ([place])   Anxiety state   Small bowel obstruction (HCC)   Hypomagnesemia   Acute lower UTI   Ileus (HCC)   Hypokalemia   Hyperammonemia (HCC)  High-grade small bowel obstruction that is post laparoscopic lysis of adhesions 09/22/2017 with subsequent Ileus -Pt presented with small bowel obstruction secondary to adhesions.  -Patient status post  laparoscopic lysis of adhesions 09/22/2017 per [name].  -Patienthadileus with no bowel movements and no flatus and hypoactive bowel sounds as of 09/23/2017.  -Patient initially started on clears with plans to advance diet as tolerated -Patient noted to have concerns for depressed consciousness and even seizure activity.  Patient subsequent transferred from [facility] long hospital to [facility].  See below -Continue to aim to keep magnesium greater than 2. Keep potassium greater than 4.  -Patient was being followed by general surgery who have subsequently signed off and recommended outpatient follow-up if patient when discharged.   Acute respiratory failure with hypoxia secondary to volume overload -Patient noted to have worsening shortness of breaththe night of 09/23/2017 to the morning of 09/24/2017,with increased O2 requirements requiring 8 L of high flow nasal cannula.  -follow up chest x-ray obtained consistent with volume overload.  -Patient is diuresed with IV Lasix with good response.  Patient also noted overnight to have likely aspirated.  Patient did receive a dose of IV Lasix.   Follow.  Acute Metabolic Encephalopathy in the setting of Advanced Dementia -Suspicion for medication induced cephalopathy versus metabolic secondary to hypernatremia and elevated ammonia levels.. -Obtain Head CT w/o Contrast: Showed No acute intracranial abnormality. Mild cerebral atrophy. Chronic sinusitis. -Appreciate input by palliative care service.  Overall prognosis seems to be poor at this time. -Tube feeds have been discontinued due to concerns for aspiration pneumonia.  -Concerns for possible continued seizure activity. Discussed with Neurology. AEDs titrated by Neurology. Continue to monitor.  Initial plans for burst suppression in ICU, now plans for gradually weaning sedating meds and trying to allow pt to wake per Neurology -MRI x2 have been done which have  been negative for any acute  abnormalities. -Change lactulose to lactulose enemas due to recent aspiration.   - Placed on D5W a and follow sodium levels. -Neurology following.  Acute Renal Failure -Likely secondary to prerenal azotemia in the setting of ACE inhibitor.  -Patient was hypotensive.  Patient hydrated with IV fluids.  Renal function has improved and currently at baseline.  Will not resume ACE inhibitor on discharge.   Sepsis likely secondary to aspiration pneumonia and Diphtheroids UTI -Per CT abdomen and pelvis as well as chest x-ray concerning for probable pneumonia.  -Patient on admission noted to be septic with hypotension, acute renal failure, tachycardia, elevated lactic acid level, elevated procalcitonin level.  -Lactic acid level and pro calcitonin levels trending down.  -Urine cultures with >100000 colonies ofDiphtheroids.  -Blood cultures pending with no growth to date. IV vancomycin was discontinued.  -completed course of IV Zosyn -Night patient noted to go into respiratory distress and work-up concerning for aspiration pneumonia.  Tube feeds have been discontinued.  IV Zosyn has been resumed.  Follow.  Anemia/Hematemesis -Patient on admission was noted to have some hematemesis however no hematemesis noted during the hospitalization.  -Patient with no overt bleeding.  -Anemia panel has been checked and iron level at 11. Ferritin at 142. Vitamin B12 levels at 282.  -Continue vitamin B12 IM injections.  -Hb/Hct nowstable at 10.0/30.2 -Patient s/pIV Feraheme.  -Transfusion threshold hemoglobin less than 8.    Continue PPI.   Hypertension -On admission patient noted to be septic with hypotension.  -Medications were held.  Blood pressure improved.  Continue current regimen of IV Lopressor and hydralazine.  Diuretics discontinued.    Schizophrenia -Patient had been on IV Depakote 250 mg q8h.  -Patient currently unresponsive/obtunded.    Hypomagnesemia -Repleted.   Follow.   Hypernatremia -Likely secondary to volume depletion.  Patient with worsening hyponatremia with no significant improvement.  Patient given a dose of IV Lasix yesterday due to hypovolemia.  Patient however with low albumin levels.  Tube feeds discontinued last night secondary to concerns for aspiration.  Placed on D5W.  Monitor fluid status closely.  We will also place on IV albumin x24 hours.   Hypokalemia -Improved after repletion.  Follow.   Abnormal LFTs -Resolved.  Follow.    Hyperbilirubinemia -Mild at 1.3 -Will continue to follow for now  Thrombocytosis -Likely reactive in the setting of aspiration infection along with UTI -Stable.  Follow.       DVT prophylaxis:SCDs Code Status: DNR Family Communication: No family at bedside. Disposition Plan: Remain in stepdown.   Consultants:   General surgery: Dr. Marcello Moores 09/20/2017  Palliative care: Jobe Gibbon, NP/Dr. Rowe Pavy 09/21/2017  Neurology: Dr. Cheral Marker 09/27/2017      Procedures:   CT abdomen and pelvis 09/20/2017  Chest x-ray 09/20/2017, 09/24/2017  Abdominal films 09/20/2017, 09/22/2017  Laparoscopic lysis of adhesions per [name] 09/22/2017  CT head 09/26/2017  Chest x-ray 09/24/2017, 09/25/2017, 10/02/2017  MRI brain 09/27/2017, 10/02/2017      Antimicrobials: (specify start and planned stop date. Auto populated tables are space occupying and do not give end dates)  IV Zosyn 09/20/2017>>> 09/29/2017  IV vancomycin 09/20/2017>>>>>> 09/22/2017  IV Zosyn 10/02/2017       Subjective: Patient unresponsive to noxious and verbal stimuli.  Events overnight noted.  Objective: Vitals:   10/03/17 1200 10/03/17 1229 10/03/17 1500 10/03/17 1946  BP: (!) 138/59  (!) 142/59 (!) 152/104  Pulse: 82  76 88  Resp:  Temp: 97.6 F (36.4 C)   97.8 F (36.6 C)  TempSrc: Axillary   Oral  SpO2: 96%   96%  Weight:  73 kg (160 lb 15 oz)    Height:        Intake/Output Summary (Last 24 hours) at  10/03/2017 2050 Last data filed at 10/03/2017 1845 Gross per 24 hour  Intake 780.33 ml  Output 2150 ml  Net -1369.67 ml   Filed Weights   09/30/17 0447 10/02/17 0327 10/03/17 1229  Weight: 75.5 kg (166 lb 7.2 oz) 75.9 kg (167 lb 5.3 oz) 73 kg (160 lb 15 oz)    Examination:  General exam: Unresponsive to noxious stimuli or verbal stimuli. Respiratory system: Coarse breath sounds bibasilar.  No wheezing.  Respiratory effort normal. Cardiovascular system: S1 & S2 heard, RRR. No JVD, murmurs, rubs, gallops or clicks. No pedal edema. Gastrointestinal system: Abdomen is nondistended, soft and nontender. No organomegaly or masses felt. Normal bowel sounds heard. Central nervous system: To noxious or verbal stimuli.  Slightly withdraws left foot on noxious stimuli.  Pupils dilated.  Flaccid.   Extremities: Symmetric 5 x 5 power. Skin: No rashes, lesions or ulcers Psychiatry: Judgement and insight appear normal. Mood & affect appropriate.     Data Reviewed: I have personally reviewed following labs and imaging studies  CBC: Recent Labs  Lab 09/27/17 0312 10/03/17 0423  WBC 16.2* 23.8*  NEUTROABS 11.6*  --   HGB 10.0* 9.5*  HCT 30.2* 30.9*  MCV 92.6 100.3*  PLT 476* 017   Basic Metabolic Panel: Recent Labs  Lab 09/27/17 0312 09/29/17 0922 09/30/17 1007 10/01/17 0440 10/02/17 0519 10/02/17 0921 10/02/17 1448 10/02/17 2008 10/03/17 0423  NA 145 146* 148* 150* 152* 150* 151* 151* 151*  K 3.4* 3.2* 3.4* 3.3* 3.8  --   --   --  4.3  CL 99* 107 111 109 117*  --   --   --  118*  CO2 34* 31 29 31 30   --   --   --  28  GLUCOSE 101* 150* 137* 104* 121*  --   --   --  98  BUN 31* 30* 25* 26* 30*  --   --   --  32*  CREATININE 1.10 0.96 0.77 0.84 0.73  --   --   --  0.81  CALCIUM 8.7* 7.9* 7.8* 8.3* 8.4*  --   --   --  8.6*  MG 2.1 2.3  --   --  2.3  --   --   --   --   PHOS 3.8  --   --   --   --   --   --   --   --    GFR: Estimated Creatinine Clearance: 90.1 mL/min (by C-G  formula based on SCr of 0.81 mg/dL). Liver Function Tests: Recent Labs  Lab 09/27/17 0312 09/30/17 1007  AST 65* 30  ALT 66* 44  ALKPHOS 92 84  BILITOT 1.3* 0.4  PROT 6.5 5.4*  ALBUMIN 2.9* 2.4*   No results for input(s): LIPASE, AMYLASE in the last 168 hours. Recent Labs  Lab 09/28/17 0526 10/01/17 1030 10/02/17 0804 10/03/17 0906  AMMONIA 68* 176* 142* 52*   Coagulation Profile: No results for input(s): INR, PROTIME in the last 168 hours. Cardiac Enzymes: No results for input(s): CKTOTAL, CKMB, CKMBINDEX, TROPONINI in the last 168 hours. BNP (last 3 results) No results for input(s): PROBNP in the last 8760 hours. HbA1C: No  results for input(s): HGBA1C in the last 72 hours. CBG: Recent Labs  Lab 10/03/17 0410 10/03/17 0928 10/03/17 1205 10/03/17 1612 10/03/17 1945  GLUCAP 97 93 105* 119* 122*   Lipid Profile: No results for input(s): CHOL, HDL, LDLCALC, TRIG, CHOLHDL, LDLDIRECT in the last 72 hours. Thyroid Function Tests: Recent Labs    10/01/17 1030  TSH 1.786   Anemia Panel: Recent Labs    10/01/17 1030  VITAMINB12 1,085*   Sepsis Labs: No results for input(s): PROCALCITON, LATICACIDVEN in the last 168 hours.  No results found for this or any previous visit (from the past 240 hour(s)).       Radiology Studies: Mr Brain Wo Contrast  Result Date: 10/02/2017 CLINICAL DATA:  Initial evaluation for acute altered mental status, seizure. EXAM: MRI HEAD WITHOUT CONTRAST TECHNIQUE: Multiplanar, multiecho pulse sequences of the brain and surrounding structures were obtained without intravenous contrast. COMPARISON:  Prior MRI from 09/27/2017 FINDINGS: Brain: Generalized age-related cerebral atrophy. Few scattered patchy T2/FLAIR hyperintensities within the periventricular and deep white matter both cerebral hemispheres, nonspecific, but felt to be within normal limits for age. No abnormal foci of restricted diffusion to suggest acute or subacute ischemia.  Gray-white matter differentiation maintained. No changes related to acute seizure identified. Probable small remote left cerebellar infarct noted (series 10, image 6). No other evidence for chronic ischemia. No foci of susceptibility artifact to suggest acute or chronic intracranial hemorrhage. No mass lesion, midline shift or mass effect. No hydrocephalus. No extra-axial fluid collection. Incidental note made of a partially empty sella. Vascular: Artery, which may be occluded (series 10, image 3). Right vertebral artery diminutive but grossly patent as is the basilar artery. Anterior circulation vascular flow voids well maintained. Within the left vertebral Skull and upper cervical spine: Craniocervical junction within normal limits. Bone marrow signal intensity normal. No scalp soft tissue abnormality. Sinuses/Orbits: Globes and orbital soft tissues within normal limits. Chronic left maxillary sinusitis noted. Superimposed air-fluid levels suggest acute sinus disease. Bilateral mastoid effusions noted. Other: None. IMPRESSION: 1. No acute intracranial abnormality. 2. Small remote left cerebellar infarct. 3. 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 Overall vertebrobasilar system is diminutive, with suspected predominant fetal type origin of the PCAs. 4. Acute on chronic left maxillary sinusitis. Electronically Signed   By: Jeannine Boga M.D.   On: 10/02/2017 03:56   Dg Chest Port 1 View  Result Date: 10/02/2017 CLINICAL DATA:  Aspiration into airway EXAM: PORTABLE CHEST 1 VIEW COMPARISON:  Portable exam 1735 hours compared to 09/25/2017 FINDINGS: Feeding tube traverses esophagus into stomach. Rotated exam. Normal heart size mediastinal contours. BILATERAL pulmonary infiltrates RIGHT greater than LEFT. No pleural effusion or pneumothorax. Bones demineralized. IMPRESSION: Persistent BILATERAL pulmonary infiltrates. Electronically Signed   By: Lavonia Dana M.D.   On: 10/02/2017  18:01        Scheduled Meds: . bisacodyl  10 mg Rectal Daily  . feeding supplement (PRO-STAT SUGAR FREE 64)  30 mL Per Tube BID  . free water  200 mL Per Tube Q8H  . lactulose  300 mL Rectal BID  . mouth rinse  15 mL Mouth Rinse BID  . metoprolol tartrate  5 mg Intravenous Q8H  . polyethylene glycol  17 g Oral BID  . sorbitol, milk of mag, mineral oil, glycerin (SMOG) enema  960 mL Rectal Once   Continuous Infusions: . [START ON 10/04/2017] albumin human    . albumin human 25 g (10/03/17 1830)  . chlorproMAZINE (THORAZINE)  IV    . dextrose 75 mL/hr at 10/03/17 1600  . famotidine (PEPCID) IV Stopped (10/03/17 1108)  . feeding supplement (OSMOLITE 1.5 CAL) 1,000 mL (10/02/17 0326)  . lacosamide (VIMPAT) IV Stopped (10/03/17 1108)  . levETIRAcetam Stopped (10/03/17 1951)  . methocarbamol (ROBAXIN)  IV Stopped (09/26/17 0030)  . piperacillin-tazobactam (ZOSYN)  IV 3.375 g (10/03/17 1458)     LOS: 13 days    Time spent: 22 minutes    Irine Seal, MD [facility] Pager [phone]  If 7PM-7AM, please contact night-coverage [email or web address] Password [email or web address] 10/03/2017, 8:50 PM

## 2017-10-04 ENCOUNTER — Inpatient Hospital Stay (HOSPITAL_COMMUNITY): Payer: Medicare Other

## 2017-10-04 LAB — GLUCOSE, CAPILLARY
GLUCOSE-CAPILLARY: 107 mg/dL — AB (ref 65–99)
GLUCOSE-CAPILLARY: 112 mg/dL — AB (ref 65–99)
Glucose-Capillary: 105 mg/dL — ABNORMAL HIGH (ref 65–99)
Glucose-Capillary: 122 mg/dL — ABNORMAL HIGH (ref 65–99)
Glucose-Capillary: 102 mg/dL — ABNORMAL HIGH (ref 65–99)

## 2017-10-04 LAB — CBC
HCT: 24.5 % — ABNORMAL LOW (ref 39.0–52.0)
Hemoglobin: 7.6 g/dL — ABNORMAL LOW (ref 13.0–17.0)
MCH: 30.4 pg (ref 26.0–34.0)
MCHC: 31 g/dL (ref 30.0–36.0)
MCV: 98 fL (ref 78.0–100.0)
PLATELETS: 270 10*3/uL (ref 150–400)
RBC: 2.5 MIL/uL — AB (ref 4.22–5.81)
RDW: 15.6 % — AB (ref 11.5–15.5)
WBC: 13.4 10*3/uL — AB (ref 4.0–10.5)

## 2017-10-04 LAB — BASIC METABOLIC PANEL
ANION GAP: 5 (ref 5–15)
ANION GAP: 10 (ref 5–15)
BUN: 29 mg/dL — ABNORMAL HIGH (ref 6–20)
BUN: 23 mg/dL — ABNORMAL HIGH (ref 6–20)
CALCIUM: 8.7 mg/dL — AB (ref 8.9–10.3)
CO2: 25 mmol/L (ref 22–32)
CO2: 20 mmol/L — ABNORMAL LOW (ref 22–32)
CREATININE: 0.78 mg/dL (ref 0.61–1.24)
Calcium: 8.8 mg/dL — ABNORMAL LOW (ref 8.9–10.3)
Chloride: 113 mmol/L — ABNORMAL HIGH (ref 101–111)
Chloride: 110 mmol/L (ref 101–111)
Creatinine, Ser: 0.74 mg/dL (ref 0.61–1.24)
GFR calc Af Amer: >60 mL/min (ref >60)
GLUCOSE: 107 mg/dL — AB (ref 65–99)
Glucose, Bld: 109 mg/dL — ABNORMAL HIGH (ref 65–99)
POTASSIUM: 4 mmol/L (ref 3.5–5.1)
Potassium: 4.5 mmol/L (ref 3.5–5.1)
SODIUM: 143 mmol/L (ref 135–145)
Sodium: 140 mmol/L (ref 135–145)

## 2017-10-04 LAB — HEMOGLOBIN AND HEMATOCRIT, BLOOD
HCT: 23.5 % — ABNORMAL LOW (ref 39.0–52.0)
HEMATOCRIT: ABNORMAL % (ref 39.0–52.0)
HEMOGLOBIN: ABNORMAL g/dL (ref 13.0–17.0)
Hemoglobin: 7.5 g/dL — ABNORMAL LOW (ref 13.0–17.0)

## 2017-10-04 LAB — AMMONIA: AMMONIA: 37 umol/L — AB (ref 9–35)

## 2017-10-04 MED ORDER — DEXTROSE-NACL 5-0.45 % IV SOLN
INTRAVENOUS | Status: DC
  Administered 2017-10-04: 21:00:00 via INTRAVENOUS
  Filled 2017-10-04 (×2): qty 1000

## 2017-10-04 NOTE — Progress Notes (Signed)
Palliative:  I went again to Kevin Humphrey' bedside. He is more alert as he will nod head to yes/no questions but did not verbalize or open his eyes for me. He will follow some simple commands. Ammonia levels are much improved. I have not heard back yet from [facility]PS and have not called them again as I am awaiting to see if Kevin Humphrey' status will improve as ammonia levels are better. I do worry about his ability to thrive after this hospitalization given underlying co-morbidities with dementia and schizophrenia and previously on hospice care. Will follow progress and engage APS for any further decisions as needed.   I will return Sunday and follow up. Please call for further palliative f/u if needed prior to my return.   15 min  Vinie Sill, NP Palliative Medicine Team Pager # [phone] (M-F 8a-5p) Team Phone # [phone] (Nights/Weekends)

## 2017-10-04 NOTE — Progress Notes (Signed)
Physical Therapy Treatment Patient Details Name: Kevin Humphrey MRN: [id number] DOB: [birth year] Today's Date: 10/04/2017    History of Present Illness 67 y.o.malewithhistory of advanced dementia, schizophrenia, hypertension was brought from the skilled nursing facility after patient was found to have persistent nausea vomiting with hematemesis. Chest x-ray done at this nursing facility was showing some infiltrates and abdomen x-ray was showing concerning for bowel obstruction.    PT Comments    Patient is more alert than previous PT session with eyes open throughout session however no visual tracking or purposeful movements noted. Total A +2 for all aspects of bed mobility and +2 for anterior translation of trunk due to extensor tone in sitting EOB. Vitals WNL during session. Pt will need mechanical lift for OOB transfers. PT will continue to follow acutely.     Follow Up Recommendations  SNF     Equipment Recommendations  Wheelchair (measurements PT);Wheelchair cushion (measurements PT)    Recommendations for Other Services       Precautions / Restrictions Precautions Precautions: Fall Precaution Comments: abd surgery Restrictions Weight Bearing Restrictions: No    Mobility  Bed Mobility Overal bed mobility: Needs Assistance Bed Mobility: Supine to Sit;Sit to Supine     Supine to sit: Total assist;+2 for physical assistance;+2 for safety/equipment;HOB elevated Sit to supine: Total assist;+2 for physical assistance;+2 for safety/equipment   General bed mobility comments: toatal A for all aspects of mobility; +2 to break extensor tone in sitting position  Transfers                    Ambulation/Gait                 Stairs             Wheelchair Mobility    Modified Rankin (Stroke Patients Only)       Balance Overall balance assessment: Needs assistance Sitting-balance support: Feet supported Sitting balance-Leahy Scale: Zero                                      Cognition Arousal/Alertness: Awake/alert Behavior During Therapy: Flat affect Overall Cognitive Status: No family/caregiver present to determine baseline cognitive functioning                                 General Comments: pt with eyes open during session but no visual tracking       Exercises Other Exercises Other Exercises: PROM performed Bilateral LEs and UEs Other Exercises: Heel cord stretches (clonus bilaterally) Other Exercises: Protective positioning for edema control and skin integrity    General Comments        Pertinent Vitals/Pain Faces Pain Scale: No hurt    Home Living                      Prior Function            PT Goals (current goals can now be found in the care plan section) Progress towards PT goals: Not progressing toward goals - comment    Frequency    Min 2X/week      PT Plan Current plan remains appropriate    Co-evaluation              AM-PAC PT "6 Clicks" Daily Activity  Outcome Measure  Difficulty turning over in bed (  including adjusting bedclothes, sheets and blankets)?: Unable Difficulty moving from lying on back to sitting on the side of the bed? : Unable Difficulty sitting down on and standing up from a chair with arms (e.g., wheelchair, bedside commode, etc,.)?: Unable Help needed moving to and from a bed to chair (including a wheelchair)?: Total Help needed walking in hospital room?: Total Help needed climbing 3-5 steps with a railing? : Total 6 Click Score: 6    End of Session Equipment Utilized During Treatment: Gait belt Activity Tolerance: Other (comment)(pt did not express pain and vitals WNL during session) Patient left: in bed;with bed alarm set;with call bell/phone within reach;with SCD's reapplied Nurse Communication: Mobility status PT Visit Diagnosis: Difficulty in walking, not elsewhere classified (R26.2);Other abnormalities of gait and  mobility (R26.89)     Time: 7482-7078 PT Time Calculation (min) (ACUTE ONLY): 28 min  Charges:  $Therapeutic Activity: 23-37 mins                    G Codes:       [name], PTA Pager: [phone]     [name] 10/04/2017, 3:22 PM

## 2017-10-04 NOTE — Progress Notes (Signed)
PROGRESS NOTE    Kevin Humphrey   HWE:[id number] DOB: 1950-10-25 DOA: 09/19/2017 PCP: [name], MD    Brief Narrative:  67 y.o.malewithhistory of advanced dementia, schizophrenia, hypertension was brought from the skilled nursing facility after patient was found to have persistent nausea vomiting with hematemesis. Chest x-ray done at this nursing facility was showing some infiltrates and abdomen x-ray was showing concerning for bowel obstruction.  In the ER stool for occult blood was positive. Abdomen was distended and initially patient on arrival was confused and minimally responsive but somewhat improved after starting antibiotics and fluids. CT abdomen pelvis shows bowel obstruction with transition point. Chest x-ray was showing possibility of infiltrates. Patient was hypotensive improved with fluids and empiric antibiotic started for sepsis likely from aspiration and found to have a UTI as well.   Underwent Laparoscopic Lysis of Adhesions for SBO and improved butcontinued to beEncephalopathic and not following commands. Because of his AMS an MRI and EEG were ordered and Neurology was consulted to evaluate and make recommendations      Assessment & Plan:   Principal Problem:   Acute metabolic encephalopathy Active Problems:   Acute respiratory failure with hypoxia (HCC)   Volume overload   GERD (gastroesophageal reflux disease)   Schizophrenia (HCC)   Malnutrition of moderate degree (HCC)   SBO (small bowel obstruction) s/p lap adhesiolysis 09/22/2017   ARF (acute renal failure) (HCC)   Hematemesis   Sepsis ([place])   Anxiety state   Small bowel obstruction (HCC)   Hypomagnesemia   Acute lower UTI   Ileus (HCC)   Hypokalemia   Hyperammonemia (HCC)  High-grade small bowel obstruction that is post laparoscopic lysis of adhesions 09/22/2017 with subsequent Ileus -Pt presented with small bowel obstruction secondary to adhesions.  -Patient status post  laparoscopic lysis of adhesions 09/22/2017 per [name].  -Patienthadileus with no bowel movements and no flatus and hypoactive bowel sounds as of 09/23/2017.  -Patient initially started on clears with plans to advance diet as tolerated -Patient noted to have concerns for depressed consciousness and even seizure activity.  Patient subsequent transferred from [facility] long hospital to [facility].  See below -Continue to aim to keep magnesium greater than 2. Keep potassium greater than 4.  -Patient was being followed by general surgery who have subsequently signed off and recommended outpatient follow-up if patient when discharged.   Acute respiratory failure with hypoxia secondary to volume overload -Patient noted to have worsening shortness of breaththe night of 09/23/2017 to the morning of 09/24/2017,with increased O2 requirements requiring 8 L of high flow nasal cannula.  -follow up chest x-ray obtained consistent with volume overload.  -Patient is diuresed with IV Lasix with good response.  Patient also noted overnight to have likely aspirated.  Patient did receive a dose of IV Lasix.   Follow.  Acute Metabolic Encephalopathy in the setting of Advanced Dementia -Suspicion for medication induced cephalopathy versus metabolic secondary to hypernatremia and elevated ammonia levels.. -Patient improving clinically and currently are alert however nonverbal. -Obtained Head CT w/o Contrast: Showed No acute intracranial abnormality. Mild cerebral atrophy. Chronic sinusitis. -Appreciate input by palliative care service.  Overall prognosis seems to be poor at this time. -Tube feeds have been discontinued due to concerns for aspiration pneumonia.  -Concerns for possible continued seizure activity. Discussed with Neurology. AEDs titrated by Neurology. Continue to monitor.  Initial plans for burst suppression in ICU, now plans for gradually weaning sedating meds and trying to allow pt to wake  per Neurology -MRI x2 have been done which have been negative for any acute abnormalities. -Ammonia levels trending down after lactulose has been changed to lactulose enemas.  Sodium levels also trending down after patient started on D5W.  Change IV fluids to D5 half-normal saline.  Neurology following.   Acute Renal Failure -Likely secondary to prerenal azotemia in the setting of ACE inhibitor.  -Patient was hypotensive.  Patient hydrated with IV fluids.  Renal function has improving daily.  Will not resume ACE inhibitor on discharge.  Sepsis likely secondary to aspiration pneumonia and Diphtheroids UTI -Per CT abdomen and pelvis as well as chest x-ray concerning for probable pneumonia.  -Patient on admission noted to be septic with hypotension, acute renal failure, tachycardia, elevated lactic acid level, elevated procalcitonin level.  -Lactic acid level and pro calcitonin levels trending down.  -Urine cultures with >100000 colonies ofDiphtheroids.  -Blood cultures pending with no growth to date. IV vancomycin was discontinued.  -completed course of IV Zosyn -Night of 10/02/2017, patient noted to go into respiratory distress with concerns for aspiration pneumonia.  Tube feeds discontinued.  Currently on IV Zosyn.    Anemia/Hematemesis -Patient on admission was noted to have some hematemesis however no hematemesis noted during the hospitalization.  -Patient with no overt bleeding.  -Anemia panel has been checked and iron level at 11. Ferritin at 142. Vitamin B12 levels at 282.  -Continue vitamin B12 IM injections.  -Seems to have dropped to 7.6 from 9.5.  Patient with no overt bleeding.  Likely dilutional.  Repeat H&H this afternoon.  Patient status post IV Feraheme.  Transfusion threshold hemoglobin less than 8.  Continue PPI.  Hypertension -On admission patient noted to be septic with hypotension.  -Medications were held.  Blood pressure improved.  IV Lopressor.   Hydralazine.  Diuretics discontinued.    Schizophrenia -Patient had been on IV Depakote 250 mg q8h.  -Patient alert today.  However not speaking.  Continue current regimen of IV Depakote.  Follow.    Hypomagnesemia -Repleted.  Follow.   Hypernatremia -Likely secondary to volume depletion.  Patient with worsening hypernatremia with no significant improvement over the past few days.  Patient received a dose of IV Lasix due to concern for volume overload.  Patient also with low albumin levels.  Tube feeds were discontinued due to concerns for aspiration.  Sodium levels improved after IV fluids have been changed to D5W.  Patient also given IV albumin x24 hours which we will discontinue.  Change IV fluids to half-normal saline.  Follow.  Hypokalemia -Repleted.  Follow.   Abnormal LFTs -Resolved.  Follow.    Hyperbilirubinemia -Mild at 1.3 -Will continue to follow for now  Thrombocytosis -Likely reactive in the setting of aspiration infection along with UTI -Stable.  Follow.    Nutrition Patient noted to have a cortrack placed and was on tube feeds however due to concerns for aspiration pneumonia tube feeds have been discontinued.  Check abdominal films for placement.  Will monitor for another 24 hours and if patient continues to improve and become more alert will have speech therapy evaluate swallow function.     DVT prophylaxis:SCDs Code Status: DNR Family Communication: No family at bedside. Disposition Plan: Remain in stepdown.   Consultants:   General surgery: Dr. Marcello Moores 09/20/2017  Palliative care: Jobe Gibbon, NP/Dr. Rowe Pavy 09/21/2017  Neurology: Dr. Cheral Marker 09/27/2017      Procedures:   CT abdomen and pelvis 09/20/2017  Chest x-ray 09/20/2017, 09/24/2017  Abdominal films 09/20/2017, 09/22/2017  Laparoscopic lysis of adhesions per [name] 09/22/2017  CT head 09/26/2017  Chest x-ray 09/24/2017, 09/25/2017, 10/02/2017  MRI brain 09/27/2017,  10/02/2017  Chest x-ray 10/04/2017 pending  Abdominal films 10/04/2017 pending      Antimicrobials:   IV Zosyn 09/20/2017>>> 09/29/2017  IV vancomycin 09/20/2017>>>>>> 09/22/2017  IV Zosyn 10/02/2017       Subjective: Patient alert.  Nonverbal.  Moving extremities spontaneously.    Objective: Vitals:   10/03/17 1946 10/03/17 2337 10/04/17 0358 10/04/17 0830  BP: (!) 152/104 (!) 144/67 140/66 (!) 146/66  Pulse: 88 65 66 66  Resp:   18 16  Temp: 97.8 F (36.6 C) 98.6 F (37 C) 97.8 F (36.6 C) 97.6 F (36.4 C)  TempSrc: Oral Oral Oral Axillary  SpO2: 96% 100% 100% 100%  Weight:      Height:        Intake/Output Summary (Last 24 hours) at 10/04/2017 1129 Last data filed at 10/04/2017 0800 Gross per 24 hour  Intake 3592 ml  Output 650 ml  Net 2942 ml   Filed Weights   09/30/17 0447 10/02/17 0327 10/03/17 1229  Weight: 75.5 kg (166 lb 7.2 oz) 75.9 kg (167 lb 5.3 oz) 73 kg (160 lb 15 oz)    Examination:  General exam: Alert.  Eyes open.  Not speaking.  No acute distress Respiratory system: Coarse bibasilar breath sounds.  No wheezing noted.  Normal respiratory effort. Cardiovascular system: Regular rate and rhythm no murmurs rubs or gallops.  No JVD.  No lower extremity edema.   Gastrointestinal system: Abdomen is soft, nontender, nondistended, positive bowel sounds.  No rebound.  No guarding. Central nervous system: Alert.  Moving extremities spontaneously. Extremities: Symmetric 5 x 5 power. Skin: No rashes, lesions or ulcers Psychiatry: Judgement and insight appear normal. Mood & affect appropriate.     Data Reviewed: I have personally reviewed following labs and imaging studies  CBC: Recent Labs  Lab 10/03/17 0423 10/04/17 0654  WBC 23.8* 13.4*  HGB 9.5* 7.6*  HCT 30.9* 24.5*  MCV 100.3* 98.0  PLT 358 329   Basic Metabolic Panel: Recent Labs  Lab 09/29/17 0922 09/30/17 1007 10/01/17 0440 10/02/17 0519 10/02/17 0921 10/02/17 1448  10/02/17 2008 10/03/17 0423 10/04/17 0407  NA 146* 148* 150* 152* 150* 151* 151* 151* 143  K 3.2* 3.4* 3.3* 3.8  --   --   --  4.3 4.5  CL 107 111 109 117*  --   --   --  118* 113*  CO2 31 29 31 30   --   --   --  28 25  GLUCOSE 150* 137* 104* 121*  --   --   --  98 109*  BUN 30* 25* 26* 30*  --   --   --  32* 29*  CREATININE 0.96 0.77 0.84 0.73  --   --   --  0.81 0.78  CALCIUM 7.9* 7.8* 8.3* 8.4*  --   --   --  8.6* 8.7*  MG 2.3  --   --  2.3  --   --   --   --   --    GFR: Estimated Creatinine Clearance: 91.3 mL/min (by C-G formula based on SCr of 0.78 mg/dL). Liver Function Tests: Recent Labs  Lab 09/30/17 1007  AST 30  ALT 44  ALKPHOS 84  BILITOT 0.4  PROT 5.4*  ALBUMIN 2.4*   No results for input(s): LIPASE, AMYLASE in the last 168 hours. Recent Labs  Lab 09/28/17 0526 10/01/17 1030 10/02/17 0804 10/03/17 0906 10/04/17 0407  AMMONIA 68* 176* 142* 52* 37*   Coagulation Profile: No results for input(s): INR, PROTIME in the last 168 hours. Cardiac Enzymes: No results for input(s): CKTOTAL, CKMB, CKMBINDEX, TROPONINI in the last 168 hours. BNP (last 3 results) No results for input(s): PROBNP in the last 8760 hours. HbA1C: No results for input(s): HGBA1C in the last 72 hours. CBG: Recent Labs  Lab 10/03/17 1612 10/03/17 1945 10/03/17 2335 10/04/17 0354 10/04/17 0828  GLUCAP 119* 122* 91 105* 107*   Lipid Profile: No results for input(s): CHOL, HDL, LDLCALC, TRIG, CHOLHDL, LDLDIRECT in the last 72 hours. Thyroid Function Tests: No results for input(s): TSH, T4TOTAL, FREET4, T3FREE, THYROIDAB in the last 72 hours. Anemia Panel: No results for input(s): VITAMINB12, FOLATE, FERRITIN, TIBC, IRON, RETICCTPCT in the last 72 hours. Sepsis Labs: No results for input(s): PROCALCITON, LATICACIDVEN in the last 168 hours.  No results found for this or any previous visit (from the past 240 hour(s)).       Radiology Studies: Dg Chest Port 1 View  Result Date:  10/02/2017 CLINICAL DATA:  Aspiration into airway EXAM: PORTABLE CHEST 1 VIEW COMPARISON:  Portable exam 1735 hours compared to 09/25/2017 FINDINGS: Feeding tube traverses esophagus into stomach. Rotated exam. Normal heart size mediastinal contours. BILATERAL pulmonary infiltrates RIGHT greater than LEFT. No pleural effusion or pneumothorax. Bones demineralized. IMPRESSION: Persistent BILATERAL pulmonary infiltrates. Electronically Signed   By: Lavonia Dana M.D.   On: 10/02/2017 18:01        Scheduled Meds: . bisacodyl  10 mg Rectal Daily  . feeding supplement (PRO-STAT SUGAR FREE 64)  30 mL Per Tube BID  . free water  200 mL Per Tube Q8H  . lactulose  300 mL Rectal BID  . mouth rinse  15 mL Mouth Rinse BID  . metoprolol tartrate  5 mg Intravenous Q8H  . polyethylene glycol  17 g Oral BID  . sorbitol, milk of mag, mineral oil, glycerin (SMOG) enema  960 mL Rectal Once   Continuous Infusions: . albumin human    . albumin human 25 g (10/04/17 0645)  . chlorproMAZINE (THORAZINE) IV    . dextrose 75 mL/hr at 10/04/17 0952  . famotidine (PEPCID) IV 20 mg (10/04/17 1049)  . feeding supplement (OSMOLITE 1.5 CAL) 1,000 mL (10/02/17 0326)  . lacosamide (VIMPAT) IV 200 mg (10/04/17 1022)  . levETIRAcetam Stopped (10/04/17 0739)  . methocarbamol (ROBAXIN)  IV Stopped (09/26/17 0030)  . piperacillin-tazobactam (ZOSYN)  IV 3.375 g (10/04/17 0806)     LOS: 14 days    Time spent: 40 minutes    Irine Seal, MD [facility] Pager [phone]  If 7PM-7AM, please contact night-coverage [email or web address] Password [facility] 10/04/2017, 11:29 AM

## 2017-10-04 NOTE — Progress Notes (Signed)
Neurology Progress Note   S:// Seen and examined. First time I have seen him awake.  Also, ammonia is normalizing.  O:// Current vital signs: BP 140/66 (BP Location: Right Arm)   Pulse 66   Temp 97.8 F (36.6 C) (Oral)   Resp 18   Ht 5\' 11"  (1.803 m)   Wt 73 kg (160 lb 15 oz)   SpO2 100%   BMI 22.45 kg/m  Vital signs in last 24 hours: Temp:  [97.6 F (36.4 C)-98.6 F (37 C)] 97.8 F (36.6 C) (06/13 0358) Pulse Rate:  [65-88] 66 (06/13 0358) Resp:  [18] 18 (06/13 0358) BP: (112-152)/(59-104) 140/66 (06/13 0358) SpO2:  [96 %-100 %] 100 % (06/13 0358) Weight:  [73 kg (160 lb 15 oz)] 73 kg (160 lb 15 oz) (06/12 1229) Gen: frail , in NAD H ENT: Normocephalic atraumatic CVS: Regular rate rhythm Chest: Scattered rales, slightly increased work of breathing Neurological examination Patient was sleeping.  Opens his eyes to voice.  Does not follow any commands. He is nonverbal. Cranial nerves: Pupils equal round react to light, response to threat from both sides, face appears symmetric, unable to assess auditory acuity. Motor exam: Does not spontaneously move any of the extremities but grimaces to pain and attempts to withdraw all 4 extremities equally. Sensory exam: As above Cerebellar: Cannot test Gait exam was deferred  Medications  Current Facility-Administered Medications:  .  [DISCONTINUED] acetaminophen (TYLENOL) tablet 650 mg, 650 mg, Oral, Q6H PRN **OR** acetaminophen (TYLENOL) suppository 650 mg, 650 mg, Rectal, Q6H PRN, Michael Boston, MD, 650 mg at 09/24/17 1711 .  albumin human 25 % solution 25 g, 25 g, Intravenous, Q6H, Eugenie Filler, MD .  albumin human 25 % solution 25 g, 25 g, Intravenous, Q6H, Eugenie Filler, MD, Last Rate: 25 mL/hr at 10/04/17 0645, 25 g at 10/04/17 0645 .  bisacodyl (DULCOLAX) suppository 10 mg, 10 mg, Rectal, Daily, Michael Boston, MD, 10 mg at 10/03/17 0953 .  chlorproMAZINE (THORAZINE) 25 mg in sodium chloride 0.9 % 25 mL IVPB, 25  mg, Intravenous, Q6H PRN, Michael Boston, MD .  dextrose 5 % solution, , Intravenous, Continuous, Eugenie Filler, MD, Last Rate: 75 mL/hr at 10/04/17 0952 .  famotidine (PEPCID) IVPB 20 mg premix, 20 mg, Intravenous, Q12H, Michael Boston, MD, Stopped at 10/04/17 0002 .  feeding supplement (OSMOLITE 1.5 CAL) liquid 1,000 mL, 1,000 mL, Per Tube, Continuous, Donne Hazel, MD, Last Rate: 50 mL/hr at 10/02/17 0326, 1,000 mL at 10/02/17 0326 .  feeding supplement (PRO-STAT SUGAR FREE 64) liquid 30 mL, 30 mL, Per Tube, BID, Donne Hazel, MD, Stopped at 10/03/17 [phone] .  free water 200 mL, 200 mL, Per Tube, Q8H, Donne Hazel, MD, Stopped at 10/03/17 2340 .  hydrALAZINE (APRESOLINE) injection 10 mg, 10 mg, Intravenous, Q4H PRN, Michael Boston, MD, 10 mg at 09/27/17 1615 .  lacosamide (VIMPAT) 200 mg in sodium chloride 0.9 % 25 mL IVPB, 200 mg, Intravenous, Q12H, Kerney Elbe, MD, Stopped at 10/04/17 0033 .  lactulose (CHRONULAC) enema 200 gm, 300 mL, Rectal, BID, Eugenie Filler, MD, 300 mL at 10/03/17 2119 .  levETIRAcetam (KEPPRA) IVPB 1500 mg/ 100 mL premix, 1,500 mg, Intravenous, Q12H, Aroor, Lanice Schwab, MD, Stopped at 10/04/17 0739 .  MEDLINE mouth rinse, 15 mL, Mouth Rinse, BID, Michael Boston, MD, 15 mL at 10/03/17 2339 .  methocarbamol (ROBAXIN) 1,000 mg in dextrose 5 % 50 mL IVPB, 1,000 mg, Intravenous, Q6H PRN, Michael Boston, MD,  Stopped at 09/26/17 0030 .  metoprolol tartrate (LOPRESSOR) injection 5 mg, 5 mg, Intravenous, Q8H, Eugenie Filler, MD, 5 mg at 10/04/17 0644 .  morphine 2 MG/ML injection 0.5-1 mg, 0.5-1 mg, Intravenous, Q4H PRN, Eugenie Filler, MD, 0.5 mg at 09/25/17 2156 .  ondansetron (ZOFRAN) tablet 4 mg, 4 mg, Oral, Q6H PRN **OR** ondansetron (ZOFRAN) injection 4 mg, 4 mg, Intravenous, Q6H PRN, Michael Boston, MD, 4 mg at 09/25/17 0525 .  piperacillin-tazobactam (ZOSYN) IVPB 3.375 g, 3.375 g, Intravenous, Q8H, Hammons, Kimberly B, RPH, Last Rate: 12.5 mL/hr at  10/04/17 0806, 3.375 g at 10/04/17 0806 .  polyethylene glycol (MIRALAX / GLYCOLAX) packet 17 g, 17 g, Oral, BID, Ralene Ok, MD, Stopped at 10/03/17 [phone] .  prochlorperazine (COMPAZINE) injection 5-10 mg, 5-10 mg, Intravenous, Q4H PRN, Michael Boston, MD, 5 mg at 09/25/17 0408 .  sorbitol, milk of mag, mineral oil, glycerin (SMOG) enema, 960 mL, Rectal, Once, Michael Boston, MD Labs CBC    Component Value Date/Time   WBC 13.4 (H) 10/04/2017 0654   RBC 2.50 (L) 10/04/2017 0654   HGB 7.6 (L) 10/04/2017 0654   HCT 24.5 (L) 10/04/2017 0654   PLT 270 10/04/2017 0654   MCV 98.0 10/04/2017 0654   MCH 30.4 10/04/2017 0654   MCHC 31.0 10/04/2017 0654   RDW 15.6 (H) 10/04/2017 0654   LYMPHSABS 2.1 09/27/2017 0312   MONOABS 2.1 (H) 09/27/2017 0312   EOSABS 0.2 09/27/2017 0312   BASOSABS 0.2 (H) 09/27/2017 0312    CMP     Component Value Date/Time   NA 143 10/04/2017 0407   NA 137 08/27/2015   K 4.5 10/04/2017 0407   CL 113 (H) 10/04/2017 0407   CO2 25 10/04/2017 0407   GLUCOSE 109 (H) 10/04/2017 0407   BUN 29 (H) 10/04/2017 0407   BUN 16 08/27/2015   CREATININE 0.78 10/04/2017 0407   CALCIUM 8.7 (L) 10/04/2017 0407   PROT 5.4 (L) 09/30/2017 1007   ALBUMIN 2.4 (L) 09/30/2017 1007   AST 30 09/30/2017 1007   ALT 44 09/30/2017 1007   ALKPHOS 84 09/30/2017 1007   BILITOT 0.4 09/30/2017 1007   GFRNONAA >60 10/04/2017 0407   GFRAA >60 10/04/2017 0407   Imaging MRI examination of the brain from 09/27/2017 and 10/02/2017 are both unremarkable for any acute process. Long-term EEG initially showed nonconvulsive status epilepticus but by day for heart no evidence of seizure activity.  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  Still not following commands and encephalopathic. He had been started on valproate amongst other antiepileptics and his ammonia level was up to 170s. With the treatment of his hyperammonemia, he is slowly improving. Given the fact that he had received a lot of medications with sedating side effects for his seizures, I would expect his recovery to be slow. To what extent will he recover, will have a return to baseline or not, it is something that will remain to be seen as time progresses.  Impression: Nonconvulsive status epilepticus-resolved Continue toxic metabolic encephalopathy-multifactorial  Recommendations Exam much improved as above. Continue with Keppra and Vimpat. Maintain seizure precautions Recheck  ammonia levels tomorrow as they are almost near normal. Management of aspiration pneumonia/respiratory issues per primary team. Management of toxic metabolic derangements per primary team. As said above, because of multiple sedating medications being used for the treatment of his nonconvulsive status epilepticus and poor cognitive baseline, he might require long time to return back to baseline.  Discussed with Dr. Grandville Silos on the phone.  Neurology service will be available as needed. Please call with questions.  -- Amie Portland, MD [facility] Pager: [phone] If 7pm to 7am, please call on call as listed on AMION.

## 2017-10-04 NOTE — Progress Notes (Signed)
Nutrition Follow-up  DOCUMENTATION CODES:   Not applicable  INTERVENTION:  RD to monitor pt progress  If TF resumed recommend Osmolite 1.5 @ 34m/hr advancing 153mQ4H to goal rate of 50 ml/hr (1200 ml/24 hrs) + Prostat BID. Total regimen at goal rate providing 2000 kcal, 105 g protein, and 912 ml H2O; 100% of protein and kcal needs.   NUTRITION DIAGNOSIS:   Inadequate oral intake related to lethargy/confusion, inability to eat as evidenced by NPO status(SLP unable to assess given pt AMS).  Ongoing  GOAL:   Patient will meet greater than or equal to 90% of their needs  Ongoing - previously met with TF  MONITOR:   Skin, TF tolerance, Weight trends, Labs, I & O's, Diet advancement  REASON FOR ASSESSMENT:   Malnutrition Screening Tool    ASSESSMENT:   [age].o. admitted on 09/19/17 from SNF for small bowel obstruction secondary to previous laparotomy on 09/22/17 with suspected metabolic encephalopathy or seizures or catatonic state. PMH of dementia, schizophrenia, htn, dysphagia, depression.   6/7 cortrak placed - pt tolerating TF 6/11 TF held due to aspiration - cortrak post-pyloric; should not have aspirated TF unless chest x-ray shows cortrak not post-pyloric   Palliative Care following pt, spoke with 6/12 - trying to contact APS. Spoke with palliative care 6/13 - appropriate to re-initiate TF from a palliative care standpoint Spoke with MD regarding re-initiating TF; will hold off on TF for now and see if SLP is able to progress him, may also order abdominal X-ray to confirm cortrak placement.   Pt awake, but unresponsive during dietetic intern visit.   Medications reviewed: FW 200 ml, lactulose, albumin - d/c'd, D5, pepcid, keppra, zosyn.   Labs reviewed: BG 109 (H), BUN 29 (H), ammonia 37 (H), WBC 13.4 (H), RBC 2.5 (L), hemoglobin 7.6 (L), HCT 24.5 (L).   Diet Order:   Diet Order           Diet NPO time specified  Diet effective now        Diet - low sodium heart  healthy          EDUCATION NEEDS:   Not appropriate for education at this time  Skin:  Skin Assessment: Skin Integrity Issues: Skin Integrity Issues:: Incisions Incisions: abdomen  Last BM:  10/03/17  Height:   Ht Readings from Last 1 Encounters:  09/20/17 5' 11"  (1.803 m)    Weight:   Wt Readings from Last 1 Encounters:  10/03/17 160 lb 15 oz (73 kg)    Ideal Body Weight:  78.18 kg  BMI:  Body mass index is 22.45 kg/m.  Estimated Nutritional Needs:   Kcal:  1800-2000 kcal  Protein:  90-105 grams  Fluid:  1.8-2 L or per MD    MeHope BuddsDietetic Intern

## 2017-10-05 LAB — CBC
HCT: 24.7 % — ABNORMAL LOW (ref 39.0–52.0)
Hemoglobin: 8 g/dL — ABNORMAL LOW (ref 13.0–17.0)
MCH: 31.1 pg (ref 26.0–34.0)
MCHC: 32.4 g/dL (ref 30.0–36.0)
MCV: 96.1 fL (ref 78.0–100.0)
PLATELETS: 307 10*3/uL (ref 150–400)
RBC: 2.57 MIL/uL — AB (ref 4.22–5.81)
RDW: 15.4 % (ref 11.5–15.5)
WBC: 14.5 10*3/uL — AB (ref 4.0–10.5)

## 2017-10-05 LAB — MAGNESIUM: MAGNESIUM: 1.8 mg/dL (ref 1.7–2.4)

## 2017-10-05 LAB — GLUCOSE, CAPILLARY
GLUCOSE-CAPILLARY: 95 mg/dL (ref 65–99)
Glucose-Capillary: 87 mg/dL (ref 65–99)
Glucose-Capillary: 88 mg/dL (ref 65–99)
Glucose-Capillary: 91 mg/dL (ref 65–99)
Glucose-Capillary: 90 mg/dL (ref 65–99)

## 2017-10-05 LAB — BASIC METABOLIC PANEL
Anion gap: 8 (ref 5–15)
BUN: 19 mg/dL (ref 6–20)
CO2: 20 mmol/L — ABNORMAL LOW (ref 22–32)
Calcium: 8.8 mg/dL — ABNORMAL LOW (ref 8.9–10.3)
Chloride: 111 mmol/L (ref 101–111)
Creatinine, Ser: 0.83 mg/dL (ref 0.61–1.24)
GFR calc Af Amer: >60 mL/min (ref >60)
Glucose, Bld: 90 mg/dL (ref 65–99)
POTASSIUM: 3.4 mmol/L — AB (ref 3.5–5.1)
SODIUM: 139 mmol/L (ref 135–145)

## 2017-10-05 LAB — AMMONIA: Ammonia: 48 umol/L — ABNORMAL HIGH (ref 9–35)

## 2017-10-05 MED ORDER — POTASSIUM CHLORIDE 2 MEQ/ML IV SOLN
INTRAVENOUS | Status: DC
  Filled 2017-10-05: qty 1000

## 2017-10-05 MED ORDER — KCL IN DEXTROSE-NACL 40-5-0.45 MEQ/L-%-% IV SOLN
INTRAVENOUS | Status: DC
  Administered 2017-10-05: 22:00:00 via INTRAVENOUS
  Filled 2017-10-05 (×2): qty 1000

## 2017-10-05 NOTE — Progress Notes (Signed)
CSW following for discharge plan. Patient still not medically ready for discharge at this time.  CSW to follow.  Laveda Abbe, [place] Clinical Social Worker [phone]

## 2017-10-05 NOTE — Progress Notes (Addendum)
Nutrition Follow-up   Nutrition Brief Note: EN consult  DOCUMENTATION CODES:   Not applicable  INTERVENTION:  Osmolite 1.5 @ 32m/hr advancing 151mQ4H to goal rate of 50 ml/hr (1200 ml/24 hrs) + Prostat BID. Total regimen at goal rate providing 2000 kcal, 105 g protein, and 912 ml H2O; 100% of protein and kcal needs.  ASSESSMENT:   [age].o. admitted on 09/19/17 from SNF for small bowel obstruction secondary to previous laparotomy on 09/22/17 with suspected metabolic encephalopathy or seizures or catatonic state. PMH of dementia, schizophrenia, htn, dysphagia, depression.   Per SLP note, pt at severe aspiration risk.  No acute changes with pt since last visit on 6/13.  Notified RN that TF will stay the same as what is currently ordered and to start at 20 ml, increasing by 10 ml Q4H until goal rate of 50 ml/hr met + PS BID.   Estimated Nutritional Needs:   Kcal:  1800-2000 kcal  Protein:  90-105 grams  Fluid:  1.8-2 L or per MD   MeHope BuddsDietetic Intern

## 2017-10-05 NOTE — Evaluation (Signed)
Clinical/Bedside Swallow Evaluation Patient Details  Name: Kevin Humphrey MRN: [id number] Date of Birth: [birth year]  Today's Date: 10/05/2017 Time: SLP Start Time (ACUTE ONLY): 0801 SLP Stop Time (ACUTE ONLY): 0835 SLP Time Calculation (min) (ACUTE ONLY): 34 min  Past Medical History:  Past Medical History:  Diagnosis Date  . Allergic rhinitis 08/02/2012  . Depression 08/02/2012  . Dysphagia, unspecified 08/02/2012  . GERD (gastroesophageal reflux disease) 08/02/2012  . Other and unspecified hyperlipidemia 08/02/2012  . Schizophrenia ([place]) 08/02/2012  . Vertebral compression fracture ([place]) 08/02/2012   Past Surgical History:  Past Surgical History:  Procedure Laterality Date  . EXPLORATORY LAPAROTOMY     ??? (Has large midline incision)  . LAPAROSCOPY N/A 09/22/2017   Procedure: LAPAROSCOPY DIAGNOSTIC  LYSIS OF ADHESIONS;  Surgeon: Michael Boston, MD;  Location: [facility];  Service: General;  Laterality: N/A;   HPI:  Patient is a 67 y.o. male with PMH: advanced dementia, schizophrenia, HTN, GERD, depression, dysphagia, who was brought to hospital from SNF secondary to persistent nausea with hematemesis. Chest x-ray at SNF showed some infiltrates and abdomen x-ray concering for bowel obstruction. Patient diagnosed at hospital with sepsis likely secondary to aspiration PNA, anemia, HTN, high-grade small bowel obstruction. on 6/1, he had laproscopy diagnositic lysis of adhesions.    Assessment / Plan / Recommendation Clinical Impression  Pt with excessive dried oral secretions upon SlP entrance to room - SLP provided extensive oral care using toothette and oral suction.  Pt moderately resistant to oral care - but did allow SLP to proceed.   Pt became excessively agitated after oral care - stating repeatedly "I'm sick" "I'm sick" requiring more than 7 minutes to calm down.  Pt did not follow directions to "cough" or "swallow" or "stick out tongue" demonstrating severely impaired attention to tasks.   Suspect cognitive and sensorimotor based dysphagia thus pt is NOT ready for po intake, will follow clinically however concerns are present for swallow to be functional given h/o dysphagia, dementia and progressive medical decline.  RN made aware of concern for oral care and pt's appearance of increased work of breathing at baseline that was exacerbated with oral care.     SLP Visit Diagnosis: Dysphagia, oropharyngeal phase (R13.12)    Aspiration Risk  Severe aspiration risk;Risk for inadequate nutrition/hydration    Diet Recommendation NPO;Other (Comment)(aggressive oral care)   Medication Administration: Via alternative means    Other  Recommendations Oral Care Recommendations: Oral care QID   Follow up Recommendations        Frequency and Duration min 1 x/week  1 week       Prognosis Prognosis for Safe Diet Advancement: Guarded Barriers to Reach Goals: Cognitive deficits;Behavior;Time post onset;Severity of deficits      Swallow Study   General Date of Onset: 09/19/17 HPI: Patient is a 67 y.o. male with PMH: advanced dementia, schizophrenia, HTN, GERD, depression, dysphagia, who was brought to hospital from SNF secondary to persistent nausea with hematemesis. Chest x-ray at SNF showed some infiltrates and abdomen x-ray concering for bowel obstruction. Patient diagnosed at hospital with sepsis likely secondary to aspiration PNA, anemia, HTN, high-grade small bowel obstruction. on 6/1, he had laproscopy diagnositic lysis of adhesions.  Type of Study: Bedside Swallow Evaluation Previous Swallow Assessment: prior bse  Diet Prior to this Study: NPO;Other (Comment)(cortrak) Respiratory Status: Nasal cannula History of Recent Intubation: No Behavior/Cognition: Alert;Confused;Lethargic/Drowsy;Requires cueing;Distractible Oral Care Completed by SLP: Other (Comment)(patient would not allow SLP to complete oral care in back of oral cavity)  Oral Cavity - Dentition: Edentulous Vision:  Impaired for self-feeding Self-Feeding Abilities: Needs assist Patient Positioning: Upright in bed Baseline Vocal Quality: Low vocal intensity Volitional Cough: Cognitively unable to elicit Volitional Swallow: Unable to elicit    Oral/Motor/Sensory Function Overall Oral Motor/Sensory Function: Generalized oral weakness   Ice Chips Ice chips: Not tested   Thin Liquid Thin Liquid: Impaired Other Comments: moisture via toothette- pt did not elicit swallow and did not follow any commands    Nectar Thick Nectar Thick Liquid: Not tested   Honey Thick Honey Thick Liquid: Not tested   Puree Puree: Not tested   Solid   GO   Solid: Not tested        Macario Golds 10/05/2017,9:02 AM  Luanna Salk, [facility] SLP [phone]

## 2017-10-05 NOTE — Progress Notes (Signed)
Pharmacy Antibiotic Note  Kevin Humphrey is a 67 y.o. male known to pharmacy from previous abx dosing.  Pt acutely decompensated 6/11 with concerns for aspiration.  Started Zosyn for aspiration PNA. Today is day 4 of therapy.  Last WBC 13.4, afebrile.   Plan: Continue Zosyn 3.375gm IV q8h (4 hour infusion) F/U LOT for aspiration PNA (7 days?) and de-escalation plan  Height: 5\' 11"  (180.3 cm) Weight: 164 lb 7.4 oz (74.6 kg) IBW/kg (Calculated) : 75.3  Temp (24hrs), Avg:98.3 F (36.8 C), Min:97.6 F (36.4 C), Max:99.4 F (37.4 C)  Recent Labs  Lab 10/01/17 0440 10/02/17 0519 10/03/17 0423 10/04/17 0407 10/04/17 0654 10/04/17 1601  WBC  --   --  23.8*  --  13.4*  --   CREATININE 0.84 0.73 0.81 0.78  --  0.74    Estimated Creatinine Clearance: 93.3 mL/min (by C-G formula based on SCr of 0.74 mg/dL).    No Known Allergies  Eryx Zane A. Levada Dy, PharmD, [place] Pager: [phone]  10/05/2017 8:29 AM

## 2017-10-05 NOTE — Progress Notes (Signed)
PROGRESS NOTE    Kevin Humphrey  CVE:[id number] DOB: [birth year] DOA: 09/19/2017 PCP: [name], MD    Brief Narrative:  67 y.o.malewithhistory of advanced dementia, schizophrenia, hypertension was brought from the skilled nursing facility after patient was found to have persistent nausea vomiting with hematemesis. Chest x-ray done at this nursing facility was showing some infiltrates and abdomen x-ray was showing concerning for bowel obstruction.  In the ER stool for occult blood was positive. Abdomen was distended and initially patient on arrival was confused and minimally responsive but somewhat improved after starting antibiotics and fluids. CT abdomen pelvis shows bowel obstruction with transition point. Chest x-ray was showing possibility of infiltrates. Patient was hypotensive improved with fluids and empiric antibiotic started for sepsis likely from aspiration and found to have a UTI as well.   Underwent Laparoscopic Lysis of Adhesions for SBO and improved butcontinued to beEncephalopathic and not following commands. Because of his AMS an MRI and EEG were ordered and Neurology was consulted to evaluate and make recommendations      Assessment & Plan:   Principal Problem:   Acute metabolic encephalopathy Active Problems:   Acute respiratory failure with hypoxia (HCC)   Volume overload   GERD (gastroesophageal reflux disease)   Schizophrenia (HCC)   Malnutrition of moderate degree (HCC)   SBO (small bowel obstruction) s/p lap adhesiolysis 09/22/2017   ARF (acute renal failure) (HCC)   Hematemesis   Sepsis ([place])   Anxiety state   Small bowel obstruction (HCC)   Hypomagnesemia   Acute lower UTI   Ileus (HCC)   Hypokalemia   Hyperammonemia (HCC)  High-grade small bowel obstruction that is post laparoscopic lysis of adhesions 09/22/2017 with subsequent Ileus -Pt presented with small bowel obstruction secondary to adhesions.  -Patient status post  laparoscopic lysis of adhesions 09/22/2017 per [name].  -Patienthadileus with no bowel movements and no flatus and hypoactive bowel sounds as of 09/23/2017.  -Patient initially started on clears with plans to advance diet as tolerated -Patient noted to have concerns for depressed consciousness and even seizure activity.  Patient subsequent transferred from [facility] long hospital to [facility].  See below -Continue to aim to keep magnesium greater than 2. Keep potassium greater than 4.  -Patient was being followed by general surgery who have subsequently signed off and recommended outpatient follow-up if patient when discharged.   Acute respiratory failure with hypoxia secondary to volume overload -Patient noted to have worsening shortness of breaththe night of 09/23/2017 to the morning of 09/24/2017,with increased O2 requirements requiring 8 L of high flow nasal cannula.  -follow up chest x-ray obtained consistent with volume overload.  -Patient is diuresed with IV Lasix with good response.  Patient also noted overnight on 10/03/2017, to have likely aspirated.  Patient did receive a dose of IV Lasix.Patient started back on empiric IV antibiotics of Zosyn. Follow.  Acute Metabolic Encephalopathy in the setting of Advanced Dementia -Suspicion for medication induced cephalopathy versus metabolic secondary to hypernatremia and elevated ammonia levels.. -Patient improving clinically and currently are alert however nonverbal somewhat drowsy. -Obtained Head CT w/o Contrast: Showed No acute intracranial abnormality. Mild cerebral atrophy. Chronic sinusitis. -Appreciate input by palliative care service.  Overall prognosis seems to be poor at this time. -Tube feeds have been discontinued due to concerns for aspiration pneumonia.  -Concerns for possible continued seizure activity. Discussed with Neurology. AEDs titrated by Neurology. Continue to monitor.  Initial plans for burst suppression in  ICU, now plans for  gradually weaning sedating meds and trying to allow pt to wake per Neurology -MRI x2 have been done which have been negative for any acute abnormalities. -Ammonia levels trending down after lactulose has been changed to lactulose enemas.  Repeat ammonia levels pending.  Sodium levels have improved and patient currently on D5 half-normal saline.  Continue lactulose enemas.  Follow.    Acute Renal Failure -Likely secondary to prerenal azotemia in the setting of ACE inhibitor.  -Patient was hypotensive.  Patient hydrated with IV fluids.  Renal function has improving daily.  Will not resume ACE inhibitor on discharge.  Sepsis likely secondary to aspiration pneumonia and Diphtheroids UTI -Per CT abdomen and pelvis as well as chest x-ray concerning for probable pneumonia.  -Patient on admission noted to be septic with hypotension, acute renal failure, tachycardia, elevated lactic acid level, elevated procalcitonin level.  -Lactic acid level and pro calcitonin levels trending down.  -Urine cultures with >100000 colonies ofDiphtheroids.  -Blood cultures pending with no growth to date. IV vancomycin was discontinued.  -completed course of IV Zosyn -Night of 10/02/2017, patient noted to go into respiratory distress with concerns for aspiration pneumonia.  Tube feeds discontinued.  The current regimen of IV Zosyn.  Follow.     Anemia/Hematemesis -Patient on admission was noted to have some hematemesis however no hematemesis noted during the hospitalization.  -Patient with no overt bleeding.  -Anemia panel has been checked and iron level at 11. Ferritin at 142. Vitamin B12 levels at 282.  -Continue vitamin B12 IM injections.  -Seems to have dropped to 7.6 from 9.5.  DC pending.  Patient with no overt bleeding.  Likely dilutional.  Patient status post IV Feraheme.  Transfusion threshold hemoglobin less than 8.  Continue PPI.  Hypertension -On admission patient noted  to be septic with hypotension.  -Medications were held.  Blood pressure improved.  Continue IV Lopressor.  Diuretics have been discontinued.  Monitor closely for volume overload.      Schizophrenia -Patient had been on IV Depakote 250 mg q8h.  -Patient alert today.  However not speaking.  Continue current regimen of IV Depakote.  Follow.    Hypomagnesemia -Replete.  Follow.   Hypernatremia -Likely secondary to volume depletion.  Patient with worsening hypernatremia with no significant improvement over the past few days.  Patient received a dose of IV Lasix due to concern for volume overload.  Patient also with low albumin levels.  Tube feeds were discontinued due to concerns for aspiration.  Sodium levels improved after IV fluids have been changed to D5W.  Patient also given IV albumin x24 hours which we will discontinue.  Currently on D5 half-normal saline.  Sodium levels are 139.  Follow.  Hypokalemia -Replete.  Follow.   Abnormal LFTs -Resolved.  Follow.    Hyperbilirubinemia -Mild at 1.3 -Will continue to follow for now  Thrombocytosis -Likely reactive in the setting of aspiration infection along with UTI -Stable.  Follow.    Nutrition Patient noted to have a cortrack placed and was on tube feeds however due to concerns for aspiration pneumonia tube feeds have been discontinued.  Consult with dietitian for tube feed resumption as patient has been assessed by speech therapy who are recommending continued n.p.o. status.     DVT prophylaxis:SCDs Code Status: DNR Family Communication: No family at bedside. Disposition Plan: Remain in stepdown.  Disposition to be determined pending clinical improvement.   Consultants:   General surgery: Dr. Marcello Moores 09/20/2017  Palliative care: Jobe Gibbon, NP/Dr. Rowe Pavy  09/21/2017  Neurology: Dr. Cheral Marker 09/27/2017      Procedures:   CT abdomen and pelvis 09/20/2017  Chest x-ray 09/20/2017, 09/24/2017  Abdominal  films 09/20/2017, 09/22/2017  Laparoscopic lysis of adhesions per [name] 09/22/2017  CT head 09/26/2017  Chest x-ray 09/24/2017, 09/25/2017, 10/02/2017  MRI brain 09/27/2017, 10/02/2017  Chest x-ray 10/04/2017   Abdominal films 10/04/2017       Antimicrobials:   IV Zosyn 09/20/2017>>> 09/29/2017  IV vancomycin 09/20/2017>>>>>> 09/22/2017  IV Zosyn 10/02/2017       Subjective: Patient however opens eyes to verbal stimuli.  Somewhat drowsy.  Mutters out no when asked whether he is short of breath or any chest pain.    Objective: Vitals:   10/05/17 0050 10/05/17 0435 10/05/17 0806 10/05/17 1123  BP: (!) 104/57 (!) 134/102 121/83 133/69  Pulse: 76 78 83 70  Resp: (!) 22  (!) 25 19  Temp: 98.7 F (37.1 C) 99.4 F (37.4 C) 97.7 F (36.5 C)   TempSrc: Oral Oral Axillary   SpO2: 98% 98% 99% 100%  Weight:      Height:        Intake/Output Summary (Last 24 hours) at 10/05/2017 1456 Last data filed at 10/05/2017 0304 Gross per 24 hour  Intake 2188.79 ml  Output 650 ml  Net 1538.79 ml   Filed Weights   10/02/17 0327 10/03/17 1229 10/04/17 1244  Weight: 75.9 kg (167 lb 5.3 oz) 73 kg (160 lb 15 oz) 74.6 kg (164 lb 7.4 oz)    Examination:  General exam: Alert.  Eyes open.  Drowsy.  Respiratory system: Coarse bibasilar breath sounds.  No wheezing noted.  Normal respiratory effort. Cardiovascular system: RRR no murmurs rubs or gallops.  No JVD.  No lower extremity edema. Gastrointestinal system: Abdomen is tender, nondistended, soft, positive bowel sounds.  No rebound.  No guarding.  Central nervous system: Sleeping, opens eyes to verbal stimuli.  Somewhat drowsy.  Moving extremities spontaneously.  Extremities: Symmetric 5 x 5 power. Skin: No rashes, lesions or ulcers Psychiatry: Judgement and insight appear normal. Mood & affect appropriate.     Data Reviewed: I have personally reviewed following labs and imaging studies  CBC: Recent Labs  Lab 10/03/17 0423 10/04/17 0654  10/04/17 1601 10/04/17 1909 10/05/17 0814  WBC 23.8* 13.4*  --   --  14.5*  HGB 9.5* 7.6* ABNORMAL 7.5* 8.0*  HCT 30.9* 24.5* ABNORMAL 23.5* 24.7*  MCV 100.3* 98.0  --   --  96.1  PLT 358 270  --   --  299   Basic Metabolic Panel: Recent Labs  Lab 09/29/17 0922  10/02/17 0519  10/02/17 2008 10/03/17 0423 10/04/17 0407 10/04/17 1601 10/05/17 0814  NA 146*   < > 152*   < > 151* 151* 143 140 139  K 3.2*   < > 3.8  --   --  4.3 4.5 4.0 3.4*  CL 107   < > 117*  --   --  118* 113* 110 111  CO2 31   < > 30  --   --  28 25 20* 20*  GLUCOSE 150*   < > 121*  --   --  98 109* 107* 90  BUN 30*   < > 30*  --   --  32* 29* 23* 19  CREATININE 0.96   < > 0.73  --   --  0.81 0.78 0.74 0.83  CALCIUM 7.9*   < > 8.4*  --   --  8.6* 8.7* 8.8* 8.8*  MG 2.3  --  2.3  --   --   --   --   --  1.8   < > = values in this interval not displayed.   GFR: Estimated Creatinine Clearance: 89.9 mL/min (by C-G formula based on SCr of 0.83 mg/dL). Liver Function Tests: Recent Labs  Lab 09/30/17 1007  AST 30  ALT 44  ALKPHOS 84  BILITOT 0.4  PROT 5.4*  ALBUMIN 2.4*   No results for input(s): LIPASE, AMYLASE in the last 168 hours. Recent Labs  Lab 10/01/17 1030 10/02/17 0804 10/03/17 0906 10/04/17 0407 10/05/17 0814  AMMONIA 176* 142* 52* 37* 48*   Coagulation Profile: No results for input(s): INR, PROTIME in the last 168 hours. Cardiac Enzymes: No results for input(s): CKTOTAL, CKMB, CKMBINDEX, TROPONINI in the last 168 hours. BNP (last 3 results) No results for input(s): PROBNP in the last 8760 hours. HbA1C: No results for input(s): HGBA1C in the last 72 hours. CBG: Recent Labs  Lab 10/04/17 1656 10/04/17 1959 10/05/17 0054 10/05/17 0425 10/05/17 1122  GLUCAP 122* 102* 95 87 88   Lipid Profile: No results for input(s): CHOL, HDL, LDLCALC, TRIG, CHOLHDL, LDLDIRECT in the last 72 hours. Thyroid Function Tests: No results for input(s): TSH, T4TOTAL, FREET4, T3FREE, THYROIDAB in  the last 72 hours. Anemia Panel: No results for input(s): VITAMINB12, FOLATE, FERRITIN, TIBC, IRON, RETICCTPCT in the last 72 hours. Sepsis Labs: No results for input(s): PROCALCITON, LATICACIDVEN in the last 168 hours.  No results found for this or any previous visit (from the past 240 hour(s)).       Radiology Studies: Dg Abd 1 View  Result Date: 10/04/2017 CLINICAL DATA:  Increased confusion, schizophrenia, feeding tube, GERD EXAM: ABDOMEN - 1 VIEW COMPARISON:  Portable exam 1252 hours compared to 09/22/2017 FINDINGS: Tip of feeding tube projects just beyond ligament of Treitz at the proximal jejunum. Air-filled mildly prominent loops of small bowel in the upper abdomen. Scattered colonic gas. Small bowel dilatation has significantly decreased since the previous exam. No definite bowel wall thickening. Bones demineralized with degenerative changes of the lumbar spine. IMPRESSION: Decreased small bowel dilatation since previous study. Electronically Signed   By: Lavonia Dana M.D.   On: 10/04/2017 13:08   Dg Chest Port 1 View  Result Date: 10/04/2017 CLINICAL DATA:  Increased confusion, feeding tube, GERD, schizophrenia EXAM: PORTABLE CHEST 1 VIEW COMPARISON:  Portable exam 1252 hours compared to 10/02/2017 FINDINGS: Feeding tube extends into stomach. Normal heart size, mediastinal contours, and pulmonary vascularity. BILATERAL pulmonary infiltrates in the mid to lower lungs unchanged. Apices clear. No pleural effusion or pneumothorax. Bones demineralized. Old RIGHT rib fractures. IMPRESSION: Persistent BILATERAL pulmonary infiltrates in the mid to lower lungs. Electronically Signed   By: Lavonia Dana M.D.   On: 10/04/2017 13:06        Scheduled Meds: . bisacodyl  10 mg Rectal Daily  . feeding supplement (PRO-STAT SUGAR FREE 64)  30 mL Per Tube BID  . free water  200 mL Per Tube Q8H  . lactulose  300 mL Rectal BID  . mouth rinse  15 mL Mouth Rinse BID  . metoprolol tartrate  5 mg  Intravenous Q8H  . polyethylene glycol  17 g Oral BID  . sorbitol, milk of mag, mineral oil, glycerin (SMOG) enema  960 mL Rectal Once   Continuous Infusions: . chlorproMAZINE (THORAZINE) IV    . dextrose 5 % and 0.45% NaCl 1,000 mL infusion 50  mL/hr at 10/05/17 1053  . famotidine (PEPCID) IV Stopped (10/05/17 1320)  . feeding supplement (OSMOLITE 1.5 CAL) 1,000 mL (10/02/17 0326)  . lacosamide (VIMPAT) IV Stopped (10/05/17 1320)  . levETIRAcetam Stopped (10/05/17 1779)  . methocarbamol (ROBAXIN)  IV Stopped (09/26/17 0030)  . piperacillin-tazobactam (ZOSYN)  IV 3.375 g (10/05/17 1052)     LOS: 15 days    Time spent: 43 minutes    Irine Seal, MD [facility] Pager [phone]  If 7PM-7AM, please contact night-coverage [email or web address] Password [facility] 10/05/2017, 2:56 PM

## 2017-10-06 ENCOUNTER — Inpatient Hospital Stay (HOSPITAL_COMMUNITY): Payer: Medicare Other

## 2017-10-06 LAB — GLUCOSE, CAPILLARY
GLUCOSE-CAPILLARY: 89 mg/dL (ref 65–99)
Glucose-Capillary: 115 mg/dL — ABNORMAL HIGH (ref 65–99)
Glucose-Capillary: 96 mg/dL (ref 65–99)
Glucose-Capillary: 104 mg/dL — ABNORMAL HIGH (ref 65–99)
Glucose-Capillary: 101 mg/dL — ABNORMAL HIGH (ref 65–99)

## 2017-10-06 LAB — CBC
HCT: 26.1 % — ABNORMAL LOW (ref 39.0–52.0)
HEMOGLOBIN: 8.3 g/dL — AB (ref 13.0–17.0)
MCH: 30.6 pg (ref 26.0–34.0)
MCHC: 31.8 g/dL (ref 30.0–36.0)
MCV: 96.3 fL (ref 78.0–100.0)
Platelets: 349 10*3/uL (ref 150–400)
RBC: 2.71 MIL/uL — ABNORMAL LOW (ref 4.22–5.81)
RDW: 15.6 % — AB (ref 11.5–15.5)
WBC: 13.7 10*3/uL — ABNORMAL HIGH (ref 4.0–10.5)

## 2017-10-06 LAB — BASIC METABOLIC PANEL
ANION GAP: 7 (ref 5–15)
BUN: 22 mg/dL — ABNORMAL HIGH (ref 6–20)
CALCIUM: 8.7 mg/dL — AB (ref 8.9–10.3)
CO2: 20 mmol/L — ABNORMAL LOW (ref 22–32)
Chloride: 113 mmol/L — ABNORMAL HIGH (ref 101–111)
Creatinine, Ser: 0.79 mg/dL (ref 0.61–1.24)
GFR calc Af Amer: >60 mL/min (ref >60)
GFR calc non Af Amer: >60 mL/min (ref >60)
GLUCOSE: 92 mg/dL (ref 65–99)
Potassium: 3.8 mmol/L (ref 3.5–5.1)
Sodium: 140 mmol/L (ref 135–145)

## 2017-10-06 NOTE — Progress Notes (Signed)
  Speech Language Pathology Treatment: Dysphagia  Patient Details Name: Kevin Humphrey MRN: [id number] DOB: 1951/02/18 Today's Date: 10/06/2017 Time: 3007-6226 SLP Time Calculation (min) (ACUTE ONLY): 10 min  Assessment / Plan / Recommendation Clinical Impression  Pt alert and responsive to questions; following commands inconsistently due to cognitive impairment. Per chart review, tube feeds were stopped briefly due to concern for aspiration but have been resumed. Oral cavity moist and pink, with minimal white coating of lingual surface. Oral care provided, with pt groping at toothette and toothbrush. Diagnostic trials of ice chips provided. Oral containment adequate but transit appears slow, swallow appears delayed. Multiple swallows noted. Work of breathing increases slightly, concerning for decreased airway protection. Recommend proceeding with MBS to determine safety for initiating POs as pt's ability to participate appears adequate. MBS to be performed this date or possibly next as fluoro schedule limited on weekends. Recommend continuing NPO with cortrak, frequent oral care.      HPI HPI: Patient is a 67 y.o. male with PMH: advanced dementia, schizophrenia, HTN, GERD, depression, dysphagia, who was brought to hospital from SNF secondary to persistent nausea with hematemesis. Chest x-ray at SNF showed some infiltrates and abdomen x-ray concering for bowel obstruction. Patient diagnosed at hospital with sepsis likely secondary to aspiration PNA, anemia, HTN, high-grade small bowel obstruction. on 6/1, he had laproscopy diagnositic lysis of adhesions.       SLP Plan  MBS       Recommendations  Diet recommendations: NPO Medication Administration: Via alternative means(cortrak)                Oral Care Recommendations: Oral care QID Follow up Recommendations: Other (comment)(tbd) SLP Visit Diagnosis: Dysphagia, oropharyngeal phase (R13.12) Plan: MBS       [place], [place], [place] Speech-Language Pathologist [phone]   Aliene Altes 10/06/2017, 8:55 AM

## 2017-10-06 NOTE — Progress Notes (Signed)
PROGRESS NOTE    Kevin Humphrey  UEA:[id number] DOB: [birth year] DOA: 09/19/2017 PCP: [name], MD    Brief Narrative:  67 y.o.malewithhistory of advanced dementia, schizophrenia, hypertension was brought from the skilled nursing facility after patient was found to have persistent nausea vomiting with hematemesis. Chest x-ray done at this nursing facility was showing some infiltrates and abdomen x-ray was showing concerning for bowel obstruction.  In the ER stool for occult blood was positive. Abdomen was distended and initially patient on arrival was confused and minimally responsive but somewhat improved after starting antibiotics and fluids. CT abdomen pelvis shows bowel obstruction with transition point. Chest x-ray was showing possibility of infiltrates. Patient was hypotensive improved with fluids and empiric antibiotic started for sepsis likely from aspiration and found to have a UTI as well.   Underwent Laparoscopic Lysis of Adhesions for SBO and improved butcontinued to beEncephalopathic and not following commands. Because of his AMS an MRI and EEG were ordered and Neurology was consulted to evaluate and make recommendations      Assessment & Plan:   Principal Problem:   Acute metabolic encephalopathy Active Problems:   Acute respiratory failure with hypoxia (HCC)   Volume overload   GERD (gastroesophageal reflux disease)   Schizophrenia (HCC)   Malnutrition of moderate degree (HCC)   SBO (small bowel obstruction) s/p lap adhesiolysis 09/22/2017   ARF (acute renal failure) (HCC)   Hematemesis   Sepsis ([place])   Anxiety state   Small bowel obstruction (HCC)   Hypomagnesemia   Acute lower UTI   Ileus (HCC)   Hypokalemia   Hyperammonemia (HCC)  High-grade small bowel obstruction that is post laparoscopic lysis of adhesions 09/22/2017 with subsequent Ileus -Pt presented with small bowel obstruction secondary to adhesions.  -Patient status post  laparoscopic lysis of adhesions 09/22/2017 per [name].  -Patienthadileus with no bowel movements and no flatus and hypoactive bowel sounds as of 09/23/2017.  -Patient initially started on clears with plans to advance diet as tolerated -Patient noted to have concerns for depressed consciousness and even seizure activity.  Patient subsequent transferred from [facility] to [facility].  See below -Continue to aim to keep magnesium greater than 2. Keep potassium greater than 4.  -Patient was being followed by general surgery who have subsequently signed off and recommended outpatient follow-up if patient when discharged.   Acute respiratory failure with hypoxia secondary to volume overload -Patient noted to have worsening shortness of breaththe night of 09/23/2017 to the morning of 09/24/2017,with increased O2 requirements requiring 8 L of high flow nasal cannula.  -follow up chest x-ray obtained consistent with volume overload.  -Patient was diuresed with IV Lasix with good response.  Patient also noted overnight on 10/03/2017, to have likely aspirated.  Patient did receive a dose of IV Lasix.Patient started back on empiric IV antibiotics of Zosyn. Follow.  Acute Metabolic Encephalopathy in the setting of Advanced Dementia secondary to hypernatremia and elevated ammonia levels -Suspicion for medication induced encephalopathy versus metabolic secondary to hypernatremia and elevated ammonia levels.. -Patient improving clinically and currently are alert and talking and following commands appropriately and moving extremities.  -Obtained Head CT w/o Contrast: Showed No acute intracranial abnormality. Mild cerebral atrophy. Chronic sinusitis. -Appreciate input by palliative care service.  Overall prognosis seems to be poor a few days ago however patient now alert.  -Tube feeds have been discontinued due to concerns for aspiration pneumonia.  Tube feeds resumed as patient more  alert. -Concerns  for possible continued seizure activity. Discussed with Neurology. AEDs titrated by Neurology. Continue to monitor.  Initial plans for burst suppression in ICU, now plans for gradually weaning sedating meds and trying to allow pt to wake per Neurology -MRI x2 have been done which have been negative for any acute abnormalities. -Ammonia levels trending down after lactulose has been changed to lactulose enemas. Sodium levels have improved and patient currently on D5 half-normal saline.  Continue lactulose enemas.  Follow.    Acute Renal Failure -Likely secondary to prerenal azotemia in the setting of ACE inhibitor.  -Patient was hypotensive on admission which has subsequently resolved.  Patient hydrated with IV fluids.  Renal function improved.  Will not resume ACE inhibitor on discharge.  Sepsis likely secondary to aspiration pneumonia and Diphtheroids UTI -Per CT abdomen and pelvis as well as chest x-ray concerning for probable pneumonia.  -Patient on admission noted to be septic with hypotension, acute renal failure, tachycardia, elevated lactic acid level, elevated procalcitonin level.  -Lactic acid level and pro calcitonin levels trending down.  -Urine cultures with >100000 colonies ofDiphtheroids.  -Blood cultures pending with no growth to date. IV vancomycin was discontinued.  -completed course of IV Zosyn -Night of 10/02/2017, patient noted to go into respiratory distress with concerns for aspiration pneumonia.  Tube feeds discontinued.  Patient started on IV Zosyn which we will continue to complete a 7-day course.  Once tolerating oral intake would likely transition to oral Augmentin.   Anemia/Hematemesis -Patient on admission was noted to have some hematemesis however no hematemesis noted during the hospitalization.  -Patient with no overt bleeding.  -Anemia panel has been checked and iron level at 11. Ferritin at 142. Vitamin B12 levels at 282.   -Continue vitamin B12 IM injections.  -Seems to have dropped to 8.3 from 7.6 from 9.5.  Patient with no overt bleeding.  Likely dilutional.  Patient status post IV Feraheme.  Transfusion threshold hemoglobin less than 8.  Continue PPI.  Hypertension -On admission patient noted to be septic with hypotension.  -Medications were held.  Blood pressure improved.  Continue current regimen of IV Lopressor.  Diuretics discontinued.  Continue IV Lopressor.  Diuretics have been discontinued.    Schizophrenia -Patient had been on IV Depakote 250 mg q8h.  -Patient alert today directing.  Patient following commands.  IV Depakote has been discontinued.  Patient currently on Vimpat and Keppra which we will continue for now.  Once patient tolerating oral intake and continues to be alert could likely transition to oral Vimpat and Keppra.  We will not resume Depakote per neurology recommendations.      Hypomagnesemia -Replete.  Follow.   Hypernatremia -Likely secondary to volume depletion.  Patient with worsening hypernatremia a few days ago.  Hypernatremia improved with D5W.  Patient received a dose of IV Lasix due to concern for volume overload.  Patient also with low albumin levels.  Tube feeds were discontinued due to concerns for aspiration.  Sodium levels improved after IV fluids  changed to D5W.  Patient also given IV albumin x24 hours.  Continue D5 half-normal saline at 50 cc/h.  Monitor sodium levels.   Hypokalemia -Repleted.  Follow.   Abnormal LFTs -Resolved.  Follow.    Hyperbilirubinemia -Mild at 1.3 -Will continue to follow for now  Thrombocytosis -Likely reactive in the setting of aspiration infection along with UTI -Stable.  Follow.    Nutrition Patient noted to have a cortrack placed and was on tube feeds however due to  concerns for aspiration pneumonia tube feeds have been discontinued.  Consulted with dietitian for tube feed resumption as patient has been assessed by  speech therapy who are recommending continued n.p.o. Status. Patient more alert today.  Patient interacting and talking.  Speech therapy reassessment and patient for modified barium swallow today.     DVT prophylaxis:SCDs Code Status: DNR Family Communication: No family at bedside. Disposition Plan: Remain in stepdown.  If patient remains alert will transfer to telemetry tomorrow.     Consultants:   General surgery: Dr. Marcello Moores 09/20/2017  Palliative care: Jobe Gibbon, NP/Dr. Rowe Pavy 09/21/2017  Neurology: Dr. Cheral Marker 09/27/2017      Procedures:   CT abdomen and pelvis 09/20/2017  Chest x-ray 09/20/2017, 09/24/2017  Abdominal films 09/20/2017, 09/22/2017  Laparoscopic lysis of adhesions per [name] 09/22/2017  CT head 09/26/2017  Chest x-ray 09/24/2017, 09/25/2017, 10/02/2017  MRI brain 09/27/2017, 10/02/2017  Chest x-ray 10/04/2017   Abdominal films 10/04/2017       Antimicrobials:   IV Zosyn 09/20/2017>>> 09/29/2017  IV vancomycin 09/20/2017>>>>>> 09/22/2017  IV Zosyn 10/02/2017       Subjective: Patient alert.  Patient talking.  Patient interacting.  Patient denies any chest pain.  Patient denies any shortness of breath.  Objective: Vitals:   10/05/17 1857 10/05/17 2030 10/06/17 0403 10/06/17 0914  BP: (!) 154/116 (!) 155/95 113/64 124/77  Pulse: (!) 123 (!) 118 75 75  Resp: (!) 22 20 20 20   Temp: 97.7 F (36.5 C) 98 F (36.7 C) 98 F (36.7 C) 97.8 F (36.6 C)  TempSrc: Axillary Axillary Oral Axillary  SpO2:  100% 100% 99%  Weight:      Height:        Intake/Output Summary (Last 24 hours) at 10/06/2017 1126 Last data filed at 10/06/2017 0600 Gross per 24 hour  Intake 1169.17 ml  Output 800 ml  Net 369.17 ml   Filed Weights   10/02/17 0327 10/03/17 1229 10/04/17 1244  Weight: 75.9 kg (167 lb 5.3 oz) 73 kg (160 lb 15 oz) 74.6 kg (164 lb 7.4 oz)    Examination:  General exam: Alert.  Interactive. Respiratory system: Coarse bibasilar breath  sounds anterior lung fields.  No wheezing noted.  Normal respiratory effort. Cardiovascular system: Regular rate rhythm no murmurs rubs or gallops.  No JVD.  No lower extremity edema. Gastrointestinal system: Abdomen is soft, nontender, nondistended, positive bowel sounds.  No rebound.  No guarding.   Central nervous system: Alert.  Talking.  Interacting.  Moving extremities spontaneously.  Extremities: Symmetric 5 x 5 power. Skin: No rashes, lesions or ulcers Psychiatry: Judgement and insight appear fair. Mood & affect appropriate.     Data Reviewed: I have personally reviewed following labs and imaging studies  CBC: Recent Labs  Lab 10/03/17 0423 10/04/17 0654 10/04/17 1601 10/04/17 1909 10/05/17 0814 10/06/17 0129  WBC 23.8* 13.4*  --   --  14.5* 13.7*  HGB 9.5* 7.6* ABNORMAL 7.5* 8.0* 8.3*  HCT 30.9* 24.5* ABNORMAL 23.5* 24.7* 26.1*  MCV 100.3* 98.0  --   --  96.1 96.3  PLT 358 270  --   --  307 858   Basic Metabolic Panel: Recent Labs  Lab 10/02/17 0519  10/03/17 0423 10/04/17 0407 10/04/17 1601 10/05/17 0814 10/06/17 0129  NA 152*   < > 151* 143 140 139 140  K 3.8  --  4.3 4.5 4.0 3.4* 3.8  CL 117*  --  118* 113* 110 111 113*  CO2 30  --  28 25 20* 20* 20*  GLUCOSE 121*  --  98 109* 107* 90 92  BUN 30*  --  32* 29* 23* 19 22*  CREATININE 0.73  --  0.81 0.78 0.74 0.83 0.79  CALCIUM 8.4*  --  8.6* 8.7* 8.8* 8.8* 8.7*  MG 2.3  --   --   --   --  1.8  --    < > = values in this interval not displayed.   GFR: Estimated Creatinine Clearance: 93.3 mL/min (by C-G formula based on SCr of 0.79 mg/dL). Liver Function Tests: Recent Labs  Lab 09/30/17 1007  AST 30  ALT 44  ALKPHOS 84  BILITOT 0.4  PROT 5.4*  ALBUMIN 2.4*   No results for input(s): LIPASE, AMYLASE in the last 168 hours. Recent Labs  Lab 10/01/17 1030 10/02/17 0804 10/03/17 0906 10/04/17 0407 10/05/17 0814  AMMONIA 176* 142* 52* 37* 48*   Coagulation Profile: No results for input(s): INR,  PROTIME in the last 168 hours. Cardiac Enzymes: No results for input(s): CKTOTAL, CKMB, CKMBINDEX, TROPONINI in the last 168 hours. BNP (last 3 results) No results for input(s): PROBNP in the last 8760 hours. HbA1C: No results for input(s): HGBA1C in the last 72 hours. CBG: Recent Labs  Lab 10/05/17 1856 10/05/17 2010 10/06/17 0010 10/06/17 0618 10/06/17 0911  GLUCAP 91 90 89 115* 96   Lipid Profile: No results for input(s): CHOL, HDL, LDLCALC, TRIG, CHOLHDL, LDLDIRECT in the last 72 hours. Thyroid Function Tests: No results for input(s): TSH, T4TOTAL, FREET4, T3FREE, THYROIDAB in the last 72 hours. Anemia Panel: No results for input(s): VITAMINB12, FOLATE, FERRITIN, TIBC, IRON, RETICCTPCT in the last 72 hours. Sepsis Labs: No results for input(s): PROCALCITON, LATICACIDVEN in the last 168 hours.  No results found for this or any previous visit (from the past 240 hour(s)).       Radiology Studies: Dg Abd 1 View  Result Date: 10/04/2017 CLINICAL DATA:  Increased confusion, schizophrenia, feeding tube, GERD EXAM: ABDOMEN - 1 VIEW COMPARISON:  Portable exam 1252 hours compared to 09/22/2017 FINDINGS: Tip of feeding tube projects just beyond ligament of Treitz at the proximal jejunum. Air-filled mildly prominent loops of small bowel in the upper abdomen. Scattered colonic gas. Small bowel dilatation has significantly decreased since the previous exam. No definite bowel wall thickening. Bones demineralized with degenerative changes of the lumbar spine. IMPRESSION: Decreased small bowel dilatation since previous study. Electronically Signed   By: Lavonia Dana M.D.   On: 10/04/2017 13:08   Dg Chest Port 1 View  Result Date: 10/04/2017 CLINICAL DATA:  Increased confusion, feeding tube, GERD, schizophrenia EXAM: PORTABLE CHEST 1 VIEW COMPARISON:  Portable exam 1252 hours compared to 10/02/2017 FINDINGS: Feeding tube extends into stomach. Normal heart size, mediastinal contours, and  pulmonary vascularity. BILATERAL pulmonary infiltrates in the mid to lower lungs unchanged. Apices clear. No pleural effusion or pneumothorax. Bones demineralized. Old RIGHT rib fractures. IMPRESSION: Persistent BILATERAL pulmonary infiltrates in the mid to lower lungs. Electronically Signed   By: Lavonia Dana M.D.   On: 10/04/2017 13:06        Scheduled Meds: . bisacodyl  10 mg Rectal Daily  . feeding supplement (PRO-STAT SUGAR FREE 64)  30 mL Per Tube BID  . free water  200 mL Per Tube Q8H  . lactulose  300 mL Rectal BID  . mouth rinse  15 mL Mouth Rinse BID  . metoprolol tartrate  5 mg Intravenous Q8H  . polyethylene glycol  17  g Oral BID  . sorbitol, milk of mag, mineral oil, glycerin (SMOG) enema  960 mL Rectal Once   Continuous Infusions: . chlorproMAZINE (THORAZINE) IV    . dextrose 5 % and 0.45 % NaCl with KCl 40 mEq/L 50 mL/hr at 10/05/17 2219  . famotidine (PEPCID) IV 20 mg (10/05/17 2220)  . feeding supplement (OSMOLITE 1.5 CAL) 50 mL/hr at 10/06/17 0607  . lacosamide (VIMPAT) IV 200 mg (10/06/17 1033)  . levETIRAcetam Stopped (10/06/17 0755)  . methocarbamol (ROBAXIN)  IV Stopped (09/26/17 0030)  . piperacillin-tazobactam (ZOSYN)  IV 3.375 g (10/06/17 1032)     LOS: 16 days    Time spent: 57 minutes    Irine Seal, MD [facility] Pager [phone]  If 7PM-7AM, please contact night-coverage [email or web address] Password [facility] 10/06/2017, 11:26 AM

## 2017-10-06 NOTE — Progress Notes (Signed)
Patient off floor for MBS.  

## 2017-10-06 NOTE — Progress Notes (Signed)
Modified Barium Swallow Progress Note  Patient Details  Name: Kevin Humphrey MRN: [id number] Date of Birth: Feb 14, 1951  Today's Date: 10/06/2017  Modified Barium Swallow completed.  Full report located under Chart Review in the Imaging Section.  Brief recommendations include the following:  Clinical Impression  Patient presents with what appears to be adequate strength and timing during this evaluation, which was limited due to pt agitation. Pt took several boluses of puree and a single teaspoon of thin liquid; no penetration or aspiration observed. There was no oral holding, timely anterior to posterior transit. Swallow initiation was timely with complete epiglottic deflection and airway closure. No residue remained in the pharynx after the swallow. An esophageal sweep was performed and was unremarkable. Consistent with bedside assessment, pt was tachypneic, though this appeared to be due to anxiety and was not associated with airway compromise. Pt does remain at risk for aspiration primarily due to cognitive deficits and mentation. Recommend dys 1, thin liquids with full supervision; ensure pt is fully alert and assist with self-feeding as able. Will follow for tolerance, possible progression of solids.   Swallow Evaluation Recommendations       SLP Diet Recommendations: Dysphagia 1 (Puree) solids;Thin liquid   Liquid Administration via: Spoon;Cup   Medication Administration: Crushed with puree   Supervision: Staff to assist with self feeding   Compensations: Slow rate;Small sips/bites;Minimize environmental distractions   Postural Changes: Remain semi-upright after after feeds/meals (Comment);Seated upright at 90 degrees   Oral Care Recommendations: Oral care BID       Deneise Lever, [place], [place] Speech-Language Pathologist [phone]  Aliene Altes 10/06/2017,1:00 PM

## 2017-10-06 NOTE — Progress Notes (Signed)
Patient back on unit.

## 2017-10-07 LAB — GLUCOSE, CAPILLARY
GLUCOSE-CAPILLARY: 100 mg/dL — AB (ref 65–99)
GLUCOSE-CAPILLARY: 100 mg/dL — AB (ref 65–99)
Glucose-Capillary: 112 mg/dL — ABNORMAL HIGH (ref 65–99)
Glucose-Capillary: 110 mg/dL — ABNORMAL HIGH (ref 65–99)
Glucose-Capillary: 118 mg/dL — ABNORMAL HIGH (ref 65–99)
Glucose-Capillary: 93 mg/dL (ref 65–99)
Glucose-Capillary: 87 mg/dL (ref 65–99)

## 2017-10-07 LAB — COMPREHENSIVE METABOLIC PANEL
ALT: 28 U/L (ref 17–63)
ANION GAP: 7 (ref 5–15)
AST: 26 U/L (ref 15–41)
Albumin: 2.9 g/dL — ABNORMAL LOW (ref 3.5–5.0)
Alkaline Phosphatase: 58 U/L (ref 38–126)
BILIRUBIN TOTAL: 0.2 mg/dL — AB (ref 0.3–1.2)
BUN: 15 mg/dL (ref 6–20)
CHLORIDE: 111 mmol/L (ref 101–111)
CO2: 21 mmol/L — ABNORMAL LOW (ref 22–32)
Calcium: 8.3 mg/dL — ABNORMAL LOW (ref 8.9–10.3)
Creatinine, Ser: 0.64 mg/dL (ref 0.61–1.24)
GFR calc Af Amer: >60 mL/min (ref >60)
GLUCOSE: 134 mg/dL — AB (ref 65–99)
Potassium: 3.5 mmol/L (ref 3.5–5.1)
SODIUM: 139 mmol/L (ref 135–145)
TOTAL PROTEIN: 6.4 g/dL — AB (ref 6.5–8.1)

## 2017-10-07 LAB — CBC
HCT: 25.9 % — ABNORMAL LOW (ref 39.0–52.0)
HEMOGLOBIN: 8.2 g/dL — AB (ref 13.0–17.0)
MCH: 30.5 pg (ref 26.0–34.0)
MCHC: 31.7 g/dL (ref 30.0–36.0)
MCV: 96.3 fL (ref 78.0–100.0)
Platelets: 379 10*3/uL (ref 150–400)
RBC: 2.69 MIL/uL — ABNORMAL LOW (ref 4.22–5.81)
RDW: 15.9 % — ABNORMAL HIGH (ref 11.5–15.5)
WBC: 10.4 10*3/uL (ref 4.0–10.5)

## 2017-10-07 LAB — MAGNESIUM: Magnesium: 1.8 mg/dL (ref 1.7–2.4)

## 2017-10-07 MED ORDER — POTASSIUM CHLORIDE 20 MEQ PO PACK
40.0000 meq | PACK | Freq: Once | ORAL | Status: AC
  Administered 2017-10-07: 40 meq via ORAL
  Filled 2017-10-07: qty 2

## 2017-10-07 MED ORDER — FUROSEMIDE 10 MG/ML IJ SOLN
20.0000 mg | Freq: Once | INTRAMUSCULAR | Status: AC
  Administered 2017-10-07: 20 mg via INTRAVENOUS
  Filled 2017-10-07: qty 4

## 2017-10-07 NOTE — Progress Notes (Addendum)
Palliative:  I met today with Mr. Mesick' who is very pleasant and much more alert today than a few days ago. Pleasantly confused - when I ask if he knows where he is he says "yes" and when I ask where he laughs and says "I don't know." He is able to appropriately answer my questions and follow commands with clear understanding of my questions on a basic level. He denies pain, difficult breathing, and does not endorse any hunger/thirst but repeats often "I'm full." Would recommend d/c tube feeding/Cortrak and give po now that he is alert and SLP has made diet recommendation. Will have to monitor intake to see that he meets nutritional needs.   Will continue to follow for need to engage APS for further decision making. Likely will need palliative to follow upon discharge.   Exam: Awake, alert, oriented to person and somewhat to surroundings. No distress. Following commands.   15 min  Vinie Sill, NP Palliative Medicine Team Pager # [phone] (M-F 8a-5p) Team Phone # [phone] (Nights/Weekends)

## 2017-10-07 NOTE — Progress Notes (Signed)
PROGRESS NOTE    Kevin Humphrey  YQI:[id number] DOB: August 05, 1950 DOA: 09/19/2017 PCP: [name], MD    Brief Narrative:  67 y.o.malewithhistory of advanced dementia, schizophrenia, hypertension was brought from the skilled nursing facility after patient was found to have persistent nausea vomiting with hematemesis. Chest x-ray done at this nursing facility was showing some infiltrates and abdomen x-ray was showing concerning for bowel obstruction.  In the ER stool for occult blood was positive. Abdomen was distended and initially patient on arrival was confused and minimally responsive but somewhat improved after starting antibiotics and fluids. CT abdomen pelvis shows bowel obstruction with transition point. Chest x-ray was showing possibility of infiltrates. Patient was hypotensive improved with fluids and empiric antibiotic started for sepsis likely from aspiration and found to have a UTI as well.   Underwent Laparoscopic Lysis of Adhesions for SBO and improved butcontinued to beEncephalopathic and not following commands. Because of his AMS an MRI and EEG were ordered and Neurology was consulted to evaluate and make recommendations      Assessment & Plan:   Principal Problem:   Acute metabolic encephalopathy Active Problems:   Acute respiratory failure with hypoxia (HCC)   Volume overload   GERD (gastroesophageal reflux disease)   Schizophrenia (HCC)   Malnutrition of moderate degree (HCC)   SBO (small bowel obstruction) s/p lap adhesiolysis 09/22/2017   ARF (acute renal failure) (HCC)   Hematemesis   Sepsis ([place])   Anxiety state   Small bowel obstruction (HCC)   Hypomagnesemia   Acute lower UTI   Ileus (HCC)   Hypokalemia   Hyperammonemia (HCC)  High-grade small bowel obstruction that is post laparoscopic lysis of adhesions 09/22/2017 with subsequent Ileus -Pt presented with small bowel obstruction secondary to adhesions.  -Patient status post  laparoscopic lysis of adhesions 09/22/2017 per [name].  -Patienthadileus with no bowel movements and no flatus and hypoactive bowel sounds as of 09/23/2017.  -Patient initially started on clears with plans to advance diet as tolerated -Patient noted to have concerns for depressed consciousness and even seizure activity.  Patient subsequent transferred from [facility] to [facility].  See below -Continue to aim to keep magnesium greater than 2. Keep potassium greater than 4.  -Patient was being followed by general surgery who have subsequently signed off and recommended outpatient follow-up if patient when discharged.   Acute respiratory failure with hypoxia secondary to volume overload -Patient noted to have worsening shortness of breaththe night of 09/23/2017 to the morning of 09/24/2017,with increased O2 requirements requiring 8 L of high flow nasal cannula.  -follow up chest x-ray obtained consistent with volume overload.  -Patient was diuresed with IV Lasix with good response.  Patient also noted overnight on 10/03/2017, to have likely aspirated.  Patient did receive a dose of IV Lasix.Patient started back on empiric IV antibiotics of Zosyn. Follow.  Acute Metabolic Encephalopathy in the setting of Advanced Dementia secondary to hypernatremia and elevated ammonia levels -Suspicion for medication induced encephalopathy versus metabolic secondary to hypernatremia and elevated ammonia levels.. -Patient improving clinically and currently are alert and talking and following commands appropriately and moving extremities.  -Obtained Head CT w/o Contrast: Showed No acute intracranial abnormality. Mild cerebral atrophy. Chronic sinusitis. -Appreciate input by palliative care service.  Overall prognosis seems to be poor a few days ago however patient now alert.  -Tube feeds have been discontinued due to concerns for aspiration pneumonia.  Tube feeds resumed as patient more  alert. -Concerns  for possible continued seizure activity. Discussed with Neurology. AEDs titrated by Neurology. Continue to monitor.  Initial plans for burst suppression in ICU, now plans for gradually weaning sedating meds and trying to allow pt to wake per Neurology -MRI x2 have been done which have been negative for any acute abnormalities. -Ammonia levels trending down after lactulose has been changed to lactulose enemas. Sodium levels have improved and patient currently on D5 half-normal saline.  Continue lactulose enemas.  Follow.    Acute Renal Failure -Likely secondary to prerenal azotemia in the setting of ACE inhibitor.  -Patient was hypotensive on admission which has subsequently resolved.  Patient hydrated with IV fluids.  Renal function improved.  Will not resume ACE inhibitor on discharge.  Sepsis likely secondary to aspiration pneumonia and Diphtheroids UTI -Per CT abdomen and pelvis as well as chest x-ray concerning for probable pneumonia.  -Patient on admission noted to be septic with hypotension, acute renal failure, tachycardia, elevated lactic acid level, elevated procalcitonin level.  -Lactic acid level and pro calcitonin levels trending down.  -Urine cultures with >100000 colonies ofDiphtheroids.  -Blood cultures pending with no growth to date. IV vancomycin was discontinued.  -completed course of IV Zosyn -Night of 10/02/2017, patient noted to go into respiratory distress with concerns for aspiration pneumonia.  Tube feeds discontinued.  Patient started on IV Zosyn which we will continue to complete a 7-day course.  Once tolerating oral intake would likely transition to oral Augmentin.   Anemia/Hematemesis -Patient on admission was noted to have some hematemesis however no hematemesis noted during the hospitalization.  -Patient with no overt bleeding.  -Anemia panel has been checked and iron level at 11. Ferritin at 142. Vitamin B12 levels at 282.   -Continue vitamin B12 IM injections.  -Seems to have dropped and currently stable at 8.2 from 8.3 from 7.6 from 9.5.  Patient with no overt bleeding.  Likely dilutional.  Patient status post IV Feraheme.  Transfusion threshold hemoglobin less than 8.  Continue PPI.  Hypertension -On admission patient noted to be septic with hypotension.  -Medications were held.  Blood pressure improved.  Continue current regimen of IV Lopressor.  Diuretics discontinued.  Continue IV Lopressor.  Diuretics have been discontinued.    Schizophrenia -Patient had been on IV Depakote 250 mg q8h.  -Patient alert following commands. IV Depakote has been discontinued.  Patient currently on Vimpat and Keppra which we will continue for now.  Once patient tolerating oral intake and continues to be alert could likely transition to oral Vimpat and Keppra.  We will not resume Depakote per neurology recommendations.      Hypomagnesemia -Replete.  Follow.   Hypernatremia -Likely secondary to volume depletion.  Patient with worsening hypernatremia 5 days ago.  Hypernatremia resolved with D5W.  Patient received a dose of IV Lasix 6 days ago, due to concern for volume overload.  Patient also with low albumin levels.  Tube feeds were discontinued due to concerns for aspiration.  Patient also given IV albumin x24 hours.  Saline lock IV fluids.  Follow.    Hypokalemia -Repleted.  Follow.   Abnormal LFTs -Resolved.  Follow.    Hyperbilirubinemia -Mild at 1.3 -Will continue to follow for now  Thrombocytosis -Likely reactive in the setting of aspiration infection along with UTI -Stable.  Follow.    Nutrition Patient noted to have a cortrack placed and was on tube feeds however due to concerns for aspiration pneumonia tube feeds have been discontinued.  Tube feeds have  been resumed.  Patient more alert and interactive and talking.  Speech therapy has assessed patient and patient underwent a modified barium swallow  on 10/06/2017.  Patient has started on a dysphagia 1 diet.  Continue for now.  Hold tube feeds for the next 6 to 7 hours.     DVT prophylaxis:SCDs Code Status: DNR Family Communication: No family at bedside. Disposition Plan: Transfer to telemetry.    Consultants:   General surgery: Dr. Marcello Moores 09/20/2017  Palliative care: Jobe Gibbon, NP/Dr. Rowe Pavy 09/21/2017  Neurology: Dr. Cheral Marker 09/27/2017      Procedures:   CT abdomen and pelvis 09/20/2017  Chest x-ray 09/20/2017, 09/24/2017  Abdominal films 09/20/2017, 09/22/2017  Laparoscopic lysis of adhesions per [name] 09/22/2017  CT head 09/26/2017  Chest x-ray 09/24/2017, 09/25/2017, 10/02/2017  MRI brain 09/27/2017, 10/02/2017  Chest x-ray 10/04/2017   Abdominal films 10/04/2017    Modified barium swallow 10/06/2017      Antimicrobials:   IV Zosyn 09/20/2017>>> 09/29/2017  IV vancomycin 09/20/2017>>>>>> 09/22/2017  IV Zosyn 10/02/2017       Subjective: Patient alert and talking and interacting appropriately.  Denies any chest pain no shortness of breath.  Started on a diet.  Per RN patient with less than 50% intake of diet.    Objective: Vitals:   10/07/17 0434 10/07/17 0500 10/07/17 0745 10/07/17 1100  BP: (!) 130/110  (!) 154/82 128/74  Pulse: 80  78 (!) 46  Resp: 20  20 (!) 22  Temp: 99 F (37.2 C)  98.9 F (37.2 C) 98.7 F (37.1 C)  TempSrc: Oral  Axillary Axillary  SpO2: 93%  96% 92%  Weight:  77.6 kg (171 lb 1.2 oz)    Height:        Intake/Output Summary (Last 24 hours) at 10/07/2017 1341 Last data filed at 10/07/2017 1016 Gross per 24 hour  Intake -  Output 1425 ml  Net -1425 ml   Filed Weights   10/04/17 1244 10/07/17 0009 10/07/17 0500  Weight: 74.6 kg (164 lb 7.4 oz) 76.6 kg (168 lb 14 oz) 77.6 kg (171 lb 1.2 oz)    Examination:  General exam: Alert.  Talking. Respiratory system: Some coarse breath sounds anterior lung fields.  No wheezing. Cardiovascular system: RRR no murmurs rubs  or gallops.  No lower extremity edema.  No JVD.  Gastrointestinal system: Abdomen is soft, mildly distended, nontender to palpation, positive bowel sounds, no rebound.  No guarding.   Central nervous system: Alert.  Talking.  Interacting.  Moving extremities spontaneously.  Extremities: Symmetric 5 x 5 power. Skin: No rashes, lesions or ulcers Psychiatry: Judgement and insight appear fair. Mood & affect appropriate.     Data Reviewed: I have personally reviewed following labs and imaging studies  CBC: Recent Labs  Lab 10/03/17 0423 10/04/17 0654 10/04/17 1601 10/04/17 1909 10/05/17 0814 10/06/17 0129 10/07/17 0943  WBC 23.8* 13.4*  --   --  14.5* 13.7* 10.4  HGB 9.5* 7.6* ABNORMAL 7.5* 8.0* 8.3* 8.2*  HCT 30.9* 24.5* ABNORMAL 23.5* 24.7* 26.1* 25.9*  MCV 100.3* 98.0  --   --  96.1 96.3 96.3  PLT 358 270  --   --  307 349 093   Basic Metabolic Panel: Recent Labs  Lab 10/02/17 0519  10/04/17 0407 10/04/17 1601 10/05/17 0814 10/06/17 0129 10/07/17 0943  NA 152*   < > 143 140 139 140 139  K 3.8   < > 4.5 4.0 3.4* 3.8 3.5  CL 117*   < >  113* 110 111 113* 111  CO2 30   < > 25 20* 20* 20* 21*  GLUCOSE 121*   < > 109* 107* 90 92 134*  BUN 30*   < > 29* 23* 19 22* 15  CREATININE 0.73   < > 0.78 0.74 0.83 0.79 0.64  CALCIUM 8.4*   < > 8.7* 8.8* 8.8* 8.7* 8.3*  MG 2.3  --   --   --  1.8  --  1.8   < > = values in this interval not displayed.   GFR: Estimated Creatinine Clearance: 94.1 mL/min (by C-G formula based on SCr of 0.64 mg/dL). Liver Function Tests: Recent Labs  Lab 10/07/17 0943  AST 26  ALT 28  ALKPHOS 58  BILITOT 0.2*  PROT 6.4*  ALBUMIN 2.9*   No results for input(s): LIPASE, AMYLASE in the last 168 hours. Recent Labs  Lab 10/01/17 1030 10/02/17 0804 10/03/17 0906 10/04/17 0407 10/05/17 0814  AMMONIA 176* 142* 52* 37* 48*   Coagulation Profile: No results for input(s): INR, PROTIME in the last 168 hours. Cardiac Enzymes: No results for  input(s): CKTOTAL, CKMB, CKMBINDEX, TROPONINI in the last 168 hours. BNP (last 3 results) No results for input(s): PROBNP in the last 8760 hours. HbA1C: No results for input(s): HGBA1C in the last 72 hours. CBG: Recent Labs  Lab 10/06/17 2010 10/07/17 0006 10/07/17 0431 10/07/17 0707 10/07/17 1151  GLUCAP 101* 100* 112* 110* 118*   Lipid Profile: No results for input(s): CHOL, HDL, LDLCALC, TRIG, CHOLHDL, LDLDIRECT in the last 72 hours. Thyroid Function Tests: No results for input(s): TSH, T4TOTAL, FREET4, T3FREE, THYROIDAB in the last 72 hours. Anemia Panel: No results for input(s): VITAMINB12, FOLATE, FERRITIN, TIBC, IRON, RETICCTPCT in the last 72 hours. Sepsis Labs: No results for input(s): PROCALCITON, LATICACIDVEN in the last 168 hours.  No results found for this or any previous visit (from the past 240 hour(s)).       Radiology Studies: Dg Swallowing Func-speech Pathology  Result Date: 10/06/2017 Objective Swallowing Evaluation: Type of Study: MBS-Modified Barium Swallow Study  Patient Details Name: Kevin Humphrey MRN: [id number] Date of Birth: 09-Nov-1950 Today's Date: 10/06/2017 Time: SLP Start Time (ACUTE ONLY): 1115 -SLP Stop Time (ACUTE ONLY): 1135 SLP Time Calculation (min) (ACUTE ONLY): 20 min Past Medical History: Past Medical History: Diagnosis Date . Allergic rhinitis 08/02/2012 . Depression 08/02/2012 . Dysphagia, unspecified 08/02/2012 . GERD (gastroesophageal reflux disease) 08/02/2012 . Other and unspecified hyperlipidemia 08/02/2012 . Schizophrenia ([place]) 08/02/2012 . Vertebral compression fracture ([place]) 08/02/2012 Past Surgical History: Past Surgical History: Procedure Laterality Date . EXPLORATORY LAPAROTOMY    ??? (Has large midline incision) . LAPAROSCOPY N/A 09/22/2017  Procedure: LAPAROSCOPY DIAGNOSTIC  LYSIS OF ADHESIONS;  Surgeon: Michael Boston, MD;  Location: [facility]S;  Service: General;  Laterality: N/A; HPI: Patient is a 67 y.o. male with PMH: advanced dementia,  schizophrenia, HTN, GERD, depression, dysphagia, who was brought to hospital from SNF secondary to persistent nausea with hematemesis. Chest x-ray at SNF showed some infiltrates and abdomen x-ray concering for bowel obstruction. Patient diagnosed at hospital with sepsis likely secondary to aspiration PNA, anemia, HTN, high-grade small bowel obstruction. on 6/1, he had laproscopy diagnositic lysis of adhesions.  Subjective: "I'm hungry. I wanna eat." Assessment / Plan / Recommendation CHL IP CLINICAL IMPRESSIONS 10/06/2017 Clinical Impression Patient presents with what appears to be adequate strength and timing during this evaluation, which was limited due to pt agitation. Pt took several boluses of puree and a single teaspoon  of thin liquid; no penetration or aspiration observed. There was no oral holding, timely anterior to posterior transit. Swallow initiation was timely with complete epiglottic deflection and airway closure. No residue remained in the pharynx after the swallow. An esophageal sweep was performed and was unremarkable. Consistent with bedside assessment, pt was tachypneic, though this appeared to be due to anxiety and was not associated with airway compromise. Pt does remain at risk for aspiration primarily due to cognitive deficits and mentation. Recommend dys 1, thin liquids with full supervision; ensure pt is fully alert and assist with self-feeding as able. Will follow for tolerance, possible progression of solids. SLP Visit Diagnosis Dysphagia, oropharyngeal phase (R13.12) Attention and concentration deficit following -- Frontal lobe and executive function deficit following -- Impact on safety and function Mild aspiration risk;Moderate aspiration risk   CHL IP TREATMENT RECOMMENDATION 10/06/2017 Treatment Recommendations Therapy as outlined in treatment plan below   Prognosis 10/06/2017 Prognosis for Safe Diet Advancement Fair Barriers to Reach Goals Cognitive deficits;Behavior Barriers/Prognosis  Comment -- CHL IP DIET RECOMMENDATION 10/06/2017 SLP Diet Recommendations Dysphagia 1 (Puree) solids;Thin liquid Liquid Administration via Spoon;Cup Medication Administration Crushed with puree Compensations Slow rate;Small sips/bites;Minimize environmental distractions Postural Changes Remain semi-upright after after feeds/meals (Comment);Seated upright at 90 degrees   CHL IP OTHER RECOMMENDATIONS 10/06/2017 Recommended Consults -- Oral Care Recommendations Oral care BID Other Recommendations --   CHL IP FOLLOW UP RECOMMENDATIONS 10/06/2017 Follow up Recommendations Other (comment)   CHL IP FREQUENCY AND DURATION 10/06/2017 Speech Therapy Frequency (ACUTE ONLY) min 2x/week Treatment Duration 2 weeks      CHL IP ORAL PHASE 10/06/2017 Oral Phase WFL Oral - Pudding Teaspoon -- Oral - Pudding Cup -- Oral - Honey Teaspoon -- Oral - Honey Cup -- Oral - Nectar Teaspoon -- Oral - Nectar Cup -- Oral - Nectar Straw -- Oral - Thin Teaspoon -- Oral - Thin Cup -- Oral - Thin Straw -- Oral - Puree -- Oral - Mech Soft -- Oral - Regular -- Oral - Multi-Consistency -- Oral - Pill -- Oral Phase - Comment --  CHL IP PHARYNGEAL PHASE 10/06/2017 Pharyngeal Phase WFL Pharyngeal- Pudding Teaspoon -- Pharyngeal -- Pharyngeal- Pudding Cup -- Pharyngeal -- Pharyngeal- Honey Teaspoon -- Pharyngeal -- Pharyngeal- Honey Cup -- Pharyngeal -- Pharyngeal- Nectar Teaspoon -- Pharyngeal -- Pharyngeal- Nectar Cup -- Pharyngeal -- Pharyngeal- Nectar Straw -- Pharyngeal -- Pharyngeal- Thin Teaspoon -- Pharyngeal -- Pharyngeal- Thin Cup -- Pharyngeal -- Pharyngeal- Thin Straw -- Pharyngeal -- Pharyngeal- Puree -- Pharyngeal -- Pharyngeal- Mechanical Soft -- Pharyngeal -- Pharyngeal- Regular -- Pharyngeal -- Pharyngeal- Multi-consistency -- Pharyngeal -- Pharyngeal- Pill -- Pharyngeal -- Pharyngeal Comment --  CHL IP CERVICAL ESOPHAGEAL PHASE 10/06/2017 Cervical Esophageal Phase WFL Pudding Teaspoon -- Pudding Cup -- Honey Teaspoon -- Honey Cup -- Nectar  Teaspoon -- Nectar Cup -- Nectar Straw -- Thin Teaspoon -- Thin Cup -- Thin Straw -- Puree -- Mechanical Soft -- Regular -- Multi-consistency -- Pill -- Cervical Esophageal Comment -- Deneise Lever, MS, CCC-SLP Speech-Language Pathologist [phone] No flowsheet data found. Aliene Altes 10/06/2017, 1:02 PM                   Scheduled Meds: . bisacodyl  10 mg Rectal Daily  . feeding supplement (PRO-STAT SUGAR FREE 64)  30 mL Per Tube BID  . free water  200 mL Per Tube Q8H  . lactulose  300 mL Rectal BID  . mouth rinse  15 mL Mouth Rinse BID  .  metoprolol tartrate  5 mg Intravenous Q8H  . polyethylene glycol  17 g Oral BID  . potassium chloride  40 mEq Oral Once   Continuous Infusions: . chlorproMAZINE (THORAZINE) IV    . famotidine (PEPCID) IV Stopped (10/07/17 1100)  . feeding supplement (OSMOLITE 1.5 CAL) 50 mL/hr at 10/06/17 0607  . lacosamide (VIMPAT) IV Stopped (10/07/17 1100)  . levETIRAcetam Stopped (10/07/17 2080)  . methocarbamol (ROBAXIN)  IV Stopped (09/26/17 0030)  . piperacillin-tazobactam (ZOSYN)  IV Stopped (10/07/17 0455)     LOS: 17 days    Time spent: 40 minutes    Irine Seal, MD [facility] Pager [phone]  If 7PM-7AM, please contact night-coverage [email or web address] Password [email or web address] 10/07/2017, 1:41 PM

## 2017-10-07 NOTE — Progress Notes (Signed)
  Speech Language Pathology Treatment: Dysphagia  Patient Details Name: Kevin Humphrey MRN: [id number] DOB: [birth year] Today's Date: 10/07/2017 Time: 9798-9211 SLP Time Calculation (min) (ACUTE ONLY): 17 min  Assessment / Plan / Recommendation Clinical Impression  Pt seen for follow-up for dysphagia for diet tolerance of dys 1, thin liquids. Pt requesting breakfast as SLP entered room. After repositioning and oral care, SLP assisted pt with consuming items from his breakfast tray. Pt able to bring cup to lips, however presence of cortrak hinders self-feeding via cup. Pt tolerated straw sips well, with no overt signs of aspiration. Bolus formation became mildly prolonged with purees after pt took several bites, with pt stating, "I'm full," repetitively. Recommend continuing dys 1, thin liquids with full supervision/assistance. Consider removal of cortrak as pt now able to take nutrition orally with assistance. Will follow up.    HPI HPI: Patient is a 67 y.o. male with PMH: advanced dementia, schizophrenia, HTN, GERD, depression, dysphagia, who was brought to hospital from SNF secondary to persistent nausea with hematemesis. Chest x-ray at SNF showed some infiltrates and abdomen x-ray concering for bowel obstruction. Patient diagnosed at hospital with sepsis likely secondary to aspiration PNA, anemia, HTN, high-grade small bowel obstruction. on 6/1, he had laproscopy diagnositic lysis of adhesions.       SLP Plan  Continue with current plan of care       Recommendations  Diet recommendations: Dysphagia 1 (puree);Thin liquid Liquids provided via: Cup;Straw Medication Administration: Crushed with puree Supervision: Staff to assist with self feeding;Full supervision/cueing for compensatory strategies Compensations: Slow rate;Small sips/bites;Minimize environmental distractions Postural Changes and/or Swallow Maneuvers: Seated upright 90 degrees                Oral Care Recommendations:  Oral care BID Follow up Recommendations: Skilled Nursing facility SLP Visit Diagnosis: Dysphagia, oropharyngeal phase (R13.12) Plan: Continue with current plan of care       [place], [place], [place] Pathologist [place] 10/07/2017, 9:23 AM

## 2017-10-08 ENCOUNTER — Inpatient Hospital Stay (HOSPITAL_COMMUNITY): Payer: Medicare Other

## 2017-10-08 DIAGNOSIS — R638 Other symptoms and signs concerning food and fluid intake: Secondary | ICD-10-CM

## 2017-10-08 DIAGNOSIS — R451 Restlessness and agitation: Secondary | ICD-10-CM

## 2017-10-08 LAB — CBC
HCT: 24.8 % — ABNORMAL LOW (ref 39.0–52.0)
Hemoglobin: 7.9 g/dL — ABNORMAL LOW (ref 13.0–17.0)
MCH: 31 pg (ref 26.0–34.0)
MCHC: 31.9 g/dL (ref 30.0–36.0)
MCV: 97.3 fL (ref 78.0–100.0)
Platelets: 417 10*3/uL — ABNORMAL HIGH (ref 150–400)
RBC: 2.55 MIL/uL — ABNORMAL LOW (ref 4.22–5.81)
RDW: 15.9 % — ABNORMAL HIGH (ref 11.5–15.5)
WBC: 11 10*3/uL — AB (ref 4.0–10.5)

## 2017-10-08 LAB — BASIC METABOLIC PANEL
ANION GAP: 11 (ref 5–15)
Anion gap: 7 (ref 5–15)
BUN: 17 mg/dL (ref 6–20)
BUN: 23 mg/dL — ABNORMAL HIGH (ref 6–20)
CALCIUM: 8.9 mg/dL (ref 8.9–10.3)
CHLORIDE: 111 mmol/L (ref 101–111)
CO2: 22 mmol/L (ref 22–32)
CO2: 19 mmol/L — AB (ref 22–32)
CREATININE: 0.73 mg/dL (ref 0.61–1.24)
CREATININE: 1.32 mg/dL — AB (ref 0.61–1.24)
Calcium: 8.4 mg/dL — ABNORMAL LOW (ref 8.9–10.3)
Chloride: 109 mmol/L (ref 101–111)
GFR calc Af Amer: >60 mL/min (ref >60)
GFR calc Af Amer: >60 mL/min (ref >60)
GFR calc non Af Amer: >60 mL/min (ref >60)
GFR calc non Af Amer: 54 mL/min — ABNORMAL LOW (ref >60)
GLUCOSE: 117 mg/dL — AB (ref 65–99)
Glucose, Bld: 97 mg/dL (ref 65–99)
POTASSIUM: 3.5 mmol/L (ref 3.5–5.1)
Potassium: 5.1 mmol/L (ref 3.5–5.1)
SODIUM: 140 mmol/L (ref 135–145)
Sodium: 139 mmol/L (ref 135–145)

## 2017-10-08 LAB — CBC WITH DIFFERENTIAL/PLATELET
ABS IMMATURE GRANULOCYTES: 0.3 10*3/uL — AB (ref 0.0–0.1)
Basophils Absolute: 0.1 10*3/uL (ref 0.0–0.1)
Basophils Relative: 1 %
Eosinophils Absolute: 0.1 10*3/uL (ref 0.0–0.7)
Eosinophils Relative: 0 %
HEMATOCRIT: 31.7 % — AB (ref 39.0–52.0)
HEMOGLOBIN: 10.5 g/dL — AB (ref 13.0–17.0)
Immature Granulocytes: 1 %
LYMPHS PCT: 7 %
Lymphs Abs: 1.5 10*3/uL (ref 0.7–4.0)
MCH: 30.4 pg (ref 26.0–34.0)
MCHC: 33.1 g/dL (ref 30.0–36.0)
MCV: 91.9 fL (ref 78.0–100.0)
MONO ABS: 2.5 10*3/uL — AB (ref 0.1–1.0)
MONOS PCT: 11 %
NEUTROS ABS: 17.7 10*3/uL — AB (ref 1.7–7.7)
Neutrophils Relative %: 80 %
Platelets: 355 10*3/uL (ref 150–400)
RBC: 3.45 MIL/uL — ABNORMAL LOW (ref 4.22–5.81)
RDW: 15.9 % — ABNORMAL HIGH (ref 11.5–15.5)
WBC: 22.1 10*3/uL — ABNORMAL HIGH (ref 4.0–10.5)

## 2017-10-08 LAB — GLUCOSE, CAPILLARY
GLUCOSE-CAPILLARY: 98 mg/dL (ref 65–99)
Glucose-Capillary: 108 mg/dL — ABNORMAL HIGH (ref 65–99)
Glucose-Capillary: 124 mg/dL — ABNORMAL HIGH (ref 65–99)

## 2017-10-08 LAB — URINALYSIS, ROUTINE W REFLEX MICROSCOPIC
Bilirubin Urine: NEGATIVE
Glucose, UA: NEGATIVE mg/dL
KETONES UR: NEGATIVE mg/dL
LEUKOCYTES UA: NEGATIVE
NITRITE: NEGATIVE
PH: 5 (ref 5.0–8.0)
Protein, ur: NEGATIVE mg/dL
Specific Gravity, Urine: 1.006 (ref 1.005–1.030)

## 2017-10-08 LAB — HEPATIC FUNCTION PANEL
ALK PHOS: 74 U/L (ref 38–126)
ALT: 34 U/L (ref 17–63)
AST: 57 U/L — AB (ref 15–41)
Albumin: 3.3 g/dL — ABNORMAL LOW (ref 3.5–5.0)
BILIRUBIN TOTAL: 1.7 mg/dL — AB (ref 0.3–1.2)
Bilirubin, Direct: 0.5 mg/dL (ref 0.1–0.5)
Indirect Bilirubin: 1.2 mg/dL — ABNORMAL HIGH (ref 0.3–0.9)
Total Protein: 7 g/dL (ref 6.5–8.1)

## 2017-10-08 LAB — SAVE SMEAR

## 2017-10-08 LAB — MAGNESIUM: MAGNESIUM: 1.8 mg/dL (ref 1.7–2.4)

## 2017-10-08 LAB — PREPARE RBC (CROSSMATCH)

## 2017-10-08 LAB — LACTATE DEHYDROGENASE: LDH: 467 U/L — ABNORMAL HIGH (ref 98–192)

## 2017-10-08 MED ORDER — DIPHENHYDRAMINE HCL 25 MG PO CAPS
25.0000 mg | ORAL_CAPSULE | Freq: Once | ORAL | Status: AC
  Administered 2017-10-08: 25 mg via ORAL
  Filled 2017-10-08: qty 1

## 2017-10-08 MED ORDER — FUROSEMIDE 10 MG/ML IJ SOLN
40.0000 mg | Freq: Once | INTRAMUSCULAR | Status: AC
  Administered 2017-10-08: 40 mg via INTRAVENOUS

## 2017-10-08 MED ORDER — POTASSIUM CHLORIDE 20 MEQ PO PACK
40.0000 meq | PACK | Freq: Once | ORAL | Status: AC
  Administered 2017-10-08: 40 meq via ORAL
  Filled 2017-10-08: qty 2

## 2017-10-08 MED ORDER — ACETAMINOPHEN 325 MG PO TABS
650.0000 mg | ORAL_TABLET | Freq: Once | ORAL | Status: AC
  Administered 2017-10-08: 650 mg via ORAL
  Filled 2017-10-08: qty 2

## 2017-10-08 MED ORDER — FUROSEMIDE 10 MG/ML IJ SOLN
40.0000 mg | Freq: Two times a day (BID) | INTRAMUSCULAR | Status: DC
  Administered 2017-10-08: 40 mg via INTRAVENOUS

## 2017-10-08 MED ORDER — ACETAMINOPHEN 325 MG PO TABS
650.0000 mg | ORAL_TABLET | ORAL | Status: DC | PRN
  Administered 2017-10-09: 650 mg via ORAL
  Filled 2017-10-08: qty 2

## 2017-10-08 MED ORDER — FUROSEMIDE 10 MG/ML IJ SOLN
40.0000 mg | Freq: Once | INTRAMUSCULAR | Status: AC
  Administered 2017-10-08: 40 mg via INTRAVENOUS
  Filled 2017-10-08: qty 4

## 2017-10-08 MED ORDER — IBUPROFEN 200 MG PO TABS: 400.0000 mg | ORAL_TABLET | Freq: Four times a day (QID) | ORAL | Status: DC | PRN

## 2017-10-08 MED ORDER — FUROSEMIDE 10 MG/ML IJ SOLN
INTRAMUSCULAR | Status: AC
  Filled 2017-10-08: qty 4

## 2017-10-08 MED ORDER — SODIUM CHLORIDE 0.9 % IV SOLN
Freq: Once | INTRAVENOUS | Status: AC
  Administered 2017-10-08: 09:00:00 via INTRAVENOUS

## 2017-10-08 MED ORDER — FUROSEMIDE 10 MG/ML IJ SOLN
20.0000 mg | Freq: Once | INTRAMUSCULAR | Status: AC
  Administered 2017-10-08: 20 mg via INTRAVENOUS
  Filled 2017-10-08: qty 4

## 2017-10-08 NOTE — Progress Notes (Signed)
Palliative:  Mr. Neece is still awake and alert. Today he appeared fine when I entered room (lab at bedside). He quickly began repeating "I can't pee" over and over while becoming increasingly loud and agitated. I discussed with RN and he has been yelling and repeating himself often this morning. He was not agitated at all yesterday.   RN voiced concern with d/c Cortrak as he is eating very little. I still agree with d/c Cortrak as this is not a long term answer if decreased intake continues and removal may aide in his comfort and decreased agitation. Recommend to d/c as many tubes/wires as possible to allow more comfort. I do not feel he would benefit from PEG tube given his level of agitation. Consider haldol 1-2 mg IV prn for escalating agitation. I feel if this is baseline this is very poor QOL.   I have still not spoken with [facility] as they never called back and there has yet to be an emergent decision needing to be made.   Exam: Confused, agitated, yelling  15 min  Vinie Sill, NP Palliative Medicine Team Pager # [phone] (M-F 8a-5p) Team Phone # [phone] (Nights/Weekends)

## 2017-10-08 NOTE — Progress Notes (Signed)
Patient complaint of feeling cold and heart rate increased to 150 and is sustaining for 10 minutes.  Patiernt is agitated and c/o leg pain.  Tylenol supp given  Dr.  Grandville Silos notified of patient's condition.  Dr Grandville Silos assessed patient.  Patient is severely agitated. Temp 102.2 axilla ,  HR140,  Bp 173/149.  IV hydralazine 10mg  given.  Will continue to monitor patient closely by RN on night duty.

## 2017-10-08 NOTE — Progress Notes (Signed)
cortrack via Left nare removed.  Patient tolerated well.

## 2017-10-08 NOTE — Progress Notes (Signed)
Called by RN that patient with increased respiratory rate while being transfused a unit of packed red blood cells.  Went to floor assess patient patient with complaints of some shortness of breath. General: Alert  respiratory: Diffuse coarse breath sounds bibasilarly and an anterior lung fields.  No wheezing. Cardiovascular: Regular rate rhythm no murmurs rubs or gallops Abdomen: Soft, nontender, nondistended, positive bowel sounds Extremities: No clubbing cyanosis or edema Assessment/plan 1.  Shortness of breath/increased respiratory rate Please secondary to volume overload.  Patient with complaints of shortness of breath.  Patient noted per RN to have an increased respiratory rate.  Lung exam consistent with volume overload.  X-ray.  We will give Lasix 40 mg IV x1.  Continue to transfuse packed red blood cells.  Lasix 40 mg IV x1 post transfusion.

## 2017-10-08 NOTE — Progress Notes (Signed)
Physical Therapy Treatment Patient Details Name: Kevin Humphrey MRN: [id number] DOB: [birth year] Today's Date: 10/08/2017    History of Present Illness 66 y.o.malewithhistory of advanced dementia, schizophrenia, hypertension was brought from the skilled nursing facility after patient was found to have persistent nausea vomiting with hematemesis. Chest x-ray done at this nursing facility was showing some infiltrates and abdomen x-ray was showing concerning for bowel obstruction.    PT Comments    Patient more alert and making progress toward PT goals. Pt required mod/max +2 for bed mobility and OOB transfer. Pt impulsive and perseverating on things therapist said during session. Continue to progress as tolerated.    Follow Up Recommendations  SNF     Equipment Recommendations  Wheelchair (measurements PT);Wheelchair cushion (measurements PT)    Recommendations for Other Services       Precautions / Restrictions Precautions Precautions: Fall Restrictions Weight Bearing Restrictions: No    Mobility  Bed Mobility Overal bed mobility: Needs Assistance Bed Mobility: Supine to Sit;Sit to Supine     Supine to sit: +2 for safety/equipment;HOB elevated;Mod assist Sit to supine: +2 for physical assistance;Max assist   General bed mobility comments: pt able to sit up most of the way without assistance and needs assist for elevating trunk into sitting and for sitting balance initially; cues for sequencing   Transfers Overall transfer level: Needs assistance Equipment used: 2 person hand held assist Transfers: Squat Pivot Transfers   Stand pivot transfers: +2 physical assistance;Max assist       General transfer comment: use of gait belt and bed pad to power up and pivot to recliner; pt returned to bed with slide transfer with +3 due to pt's impulsivity and unsafe movements in chair  Ambulation/Gait                 Stairs             Wheelchair Mobility     Modified Rankin (Stroke Patients Only)       Balance Overall balance assessment: Needs assistance Sitting-balance support: Feet supported;Bilateral upper extremity supported Sitting balance-Leahy Scale: Fair Sitting balance - Comments: initially pt required assistance to maintain sitting balance EOB but seemed to be grossly due to pt's anxiety with transitional movements; pt progressed to sitting EOB with min guard for safety                                    Cognition Arousal/Alertness: Awake/alert Behavior During Therapy: Flat affect;Restless;Impulsive Overall Cognitive Status: No family/caregiver present to determine baseline cognitive functioning Area of Impairment: Attention;Following commands;Safety/judgement;Awareness;Problem solving                   Current Attention Level: Focused   Following Commands: Follows one step commands inconsistently Safety/Judgement: Decreased awareness of safety;Decreased awareness of deficits Awareness: Intellectual Problem Solving: Decreased initiation;Difficulty sequencing;Requires verbal cues;Requires tactile cues General Comments: pt perseverating on statements made by therapist       Exercises      General Comments        Pertinent Vitals/Pain Pain Assessment: Faces Faces Pain Scale: No hurt    Home Living                      Prior Function            PT Goals (current goals can now be found in the care plan section) Acute Rehab PT  Goals PT Goal Formulation: Patient unable to participate in goal setting Time For Goal Achievement: 10/08/17 Potential to Achieve Goals: Fair    Frequency    Min 2X/week      PT Plan Current plan remains appropriate    Co-evaluation              AM-PAC PT "6 Clicks" Daily Activity  Outcome Measure  Difficulty turning over in bed (including adjusting bedclothes, sheets and blankets)?: Unable Difficulty moving from lying on back to sitting  on the side of the bed? : Unable Difficulty sitting down on and standing up from a chair with arms (e.g., wheelchair, bedside commode, etc,.)?: Unable Help needed moving to and from a bed to chair (including a wheelchair)?: Total Help needed walking in hospital room?: Total Help needed climbing 3-5 steps with a railing? : Total 6 Click Score: 6    End of Session Equipment Utilized During Treatment: Gait belt Activity Tolerance: Patient tolerated treatment well Patient left: in bed;with bed alarm set;with call bell/phone within reach;with SCD's reapplied;with nursing/sitter in room Nurse Communication: Mobility status PT Visit Diagnosis: Difficulty in walking, not elsewhere classified (R26.2);Other abnormalities of gait and mobility (R26.89)     Time: 9629-5284 PT Time Calculation (min) (ACUTE ONLY): 30 min  Charges:  $Therapeutic Activity: 23-37 mins                    G Codes:       Kevin Humphrey, PTA Pager: [phone]     Kevin Humphrey 10/08/2017, 3:53 PM

## 2017-10-08 NOTE — Progress Notes (Signed)
PROGRESS NOTE    Kevin Humphrey  GUR:[id number] DOB: 18-Mar-1951 DOA: 09/19/2017 PCP: [name], MD    Brief Narrative:  67 y.o.malewithhistory of advanced dementia, schizophrenia, hypertension was brought from the skilled nursing facility after patient was found to have persistent nausea vomiting with hematemesis. Chest x-ray done at this nursing facility was showing some infiltrates and abdomen x-ray was showing concerning for bowel obstruction.  In the ER stool for occult blood was positive. Abdomen was distended and initially patient on arrival was confused and minimally responsive but somewhat improved after starting antibiotics and fluids. CT abdomen pelvis shows bowel obstruction with transition point. Chest x-ray was showing possibility of infiltrates. Patient was hypotensive improved with fluids and empiric antibiotic started for sepsis likely from aspiration and found to have a UTI as well.   Underwent Laparoscopic Lysis of Adhesions for SBO and improved butcontinued to beEncephalopathic and not following commands. Because of his AMS an MRI and EEG were ordered and Neurology was consulted to evaluate and make recommendations      Assessment & Plan:   Principal Problem:   Acute metabolic encephalopathy Active Problems:   Acute respiratory failure with hypoxia (HCC)   Volume overload   GERD (gastroesophageal reflux disease)   Schizophrenia (HCC)   Malnutrition of moderate degree (HCC)   SBO (small bowel obstruction) s/p lap adhesiolysis 09/22/2017   ARF (acute renal failure) (HCC)   Hematemesis   Sepsis ([place])   Anxiety state   Small bowel obstruction (HCC)   Hypomagnesemia   Acute lower UTI   Ileus (HCC)   Hypokalemia   Hyperammonemia (HCC)  High-grade small bowel obstruction that is post laparoscopic lysis of adhesions 09/22/2017 with subsequent Ileus -Pt presented with small bowel obstruction secondary to adhesions.  -Patient status post  laparoscopic lysis of adhesions 09/22/2017 per [name].  -Patienthadileus with no bowel movements and no flatus and hypoactive bowel sounds as of 09/23/2017.  -Patient initially started on clears with plans to advance diet as tolerated -Patient noted to have concerns for depressed consciousness and even seizure activity.  Patient subsequent transferred from [facility] to [facility]  See below -Continue to aim to keep magnesium greater than 2. Keep potassium greater than 4.  -Patient was being followed by general surgery who have subsequently signed off and recommended outpatient follow-up if patient when discharged.   Acute respiratory failure with hypoxia secondary to volume overload -Patient noted to have worsening shortness of breaththe night of 09/23/2017 to the morning of 09/24/2017,with increased O2 requirements requiring 8 L of high flow nasal cannula.  -follow up chest x-ray obtained consistent with volume overload.  -Patient was diuresed with IV Lasix with good response.  Patient also noted overnight on 10/03/2017, to have likely aspirated.  Patient did receive a dose of IV Lasix.Patient started back on empiric IV antibiotics of Zosyn. Follow.  Acute Metabolic Encephalopathy in the setting of Advanced Dementia secondary to hypernatremia and elevated ammonia levels -Suspicion for medication induced encephalopathy versus metabolic secondary to hypernatremia and elevated ammonia levels.. -Patient improved clinically and currently are alert and talking and following commands appropriately and moving extremities.  Mentation may be close to baseline. -Obtained Head CT w/o Contrast: Showed No acute intracranial abnormality. Mild cerebral atrophy. Chronic sinusitis. -Appreciate input by palliative care service.  Overall prognosis seems to be poor a few days ago however patient now alert.  -Tube feeds have been discontinued due to concerns for aspiration pneumonia.  Tube  feeds  were resumed as patient noted to be more alert.  Will discontinue tube feeds as patient started on oral diet. -Concerns for possible continued seizure activity. Discussed with Neurology. AEDs titrated by Neurology. Continue to monitor.  Initial plans for burst suppression in ICU, now plans for gradually weaning sedating meds and trying to allow pt to wake per Neurology -MRI x2 have been done which have been negative for any acute abnormalities. -Ammonia levels trending down after lactulose has been changed to lactulose enemas. Sodium levels have improved. NSL IVF. Continue lactulose enemas and if continued improvement will discontinue in the next 24 hours.  Follow.    Acute Renal Failure -Likely secondary to prerenal azotemia in the setting of ACE inhibitor.  -Patient was hypotensive on admission which has subsequently resolved.  Patient hydrated with IV fluids.  Renal function improved.  Will not resume ACE inhibitor on discharge.  Sepsis likely secondary to aspiration pneumonia and Diphtheroids UTI -Per CT abdomen and pelvis as well as chest x-ray concerning for probable pneumonia.  -Patient on admission noted to be septic with hypotension, acute renal failure, tachycardia, elevated lactic acid level, elevated procalcitonin level.  -Lactic acid level and pro calcitonin levels trending down.  -Urine cultures with >100000 colonies ofDiphtheroids.  -Blood cultures pending with no growth to date. IV vancomycin was discontinued.  -completed course of IV Zosyn -Night of 10/02/2017, patient noted to go into respiratory distress with concerns for aspiration pneumonia.  Tube feeds discontinued.  Patient started on IV Zosyn which we will continue to complete a 7-day course.    Anemia/Hematemesis -Patient on admission was noted to have some hematemesis however no hematemesis noted during the hospitalization.  -Patient with no overt bleeding.  -Anemia panel has been checked and iron  level at 11. Ferritin at 142. Vitamin B12 levels at 282.  -Continue vitamin B12 IM injections.  -Seems to have dropped and currently 7.9 from 8.2 from 8.3 from 7.6 from 9.5.  Patient with no overt bleeding.  Likely dilutional.  Patient status post IV Feraheme.  Transfusion threshold hemoglobin less than 8.  Transfuse 1 unit packed red blood cells.  Continue PPI.  Hypertension -On admission patient noted to be septic with hypotension.  -Medications were held.  Blood pressure improved.  Continue current regimen of IV Lopressor.  Diuretics discontinued.  Continue IV Lopressor.  Schizophrenia -Patient had been on IV Depakote 250 mg q8h.  -Patient alert following commands. IV Depakote has been discontinued.  Patient currently on Vimpat and Keppra which we will continue for now.  Once patient tolerating oral intake and continues to be alert could likely transition to oral Vimpat and Keppra.  We will not resume Depakote per neurology recommendations.      Hypomagnesemia -Replete.  Follow.   Hypernatremia -Likely secondary to volume depletion.  Patient with worsening hypernatremia 6 days ago.  Hypernatremia resolved with D5W.  Patient received a dose of IV Lasix 7 days ago, due to concern for volume overload.  Patient also with low albumin levels.  Tube feeds were discontinued due to concerns for aspiration.  Patient also given IV albumin x24 hours.  Saline lock IV fluids.  Follow.    Hypokalemia -We will give a dose of K. Dur 40 milliequivalents p.o. x1.  Follow.    Abnormal LFTs -Resolved.  Follow.    Hyperbilirubinemia -Mild at 1.3 -Will continue to follow for now  Thrombocytosis -Likely reactive in the setting of aspiration infection along with UTI -Stable.  Follow.  Nutrition Patient noted to have a cortrack placed and was on tube feeds however due to concerns for aspiration pneumonia tube feeds have been discontinued.  Tube feeds have been resumed.  Patient more alert  and interactive and talking.  Speech therapy has assessed patient and patient underwent a modified barium swallow on 10/06/2017.  Patient has started on a dysphagia 1 diet.  Discontinue coretrack.  Discontinue tube feeds.  Dietitian following.     DVT prophylaxis:SCDs Code Status: DNR Family Communication: No family at bedside. Disposition Plan: Transfer to telemetry.    Consultants:   General surgery: Dr. Marcello Moores 09/20/2017  Palliative care: Jobe Gibbon, NP/Dr. Rowe Pavy 09/21/2017  Neurology: Dr. Cheral Marker 09/27/2017      Procedures:   CT abdomen and pelvis 09/20/2017  Chest x-ray 09/20/2017, 09/24/2017  Abdominal films 09/20/2017, 09/22/2017  Laparoscopic lysis of adhesions per [name] 09/22/2017  CT head 09/26/2017  Chest x-ray 09/24/2017, 09/25/2017, 10/02/2017  MRI brain 09/27/2017, 10/02/2017  Chest x-ray 10/04/2017   Abdominal films 10/04/2017    Modified barium swallow 10/06/2017      Antimicrobials:   IV Zosyn 09/20/2017>>> 09/29/2017  IV vancomycin 09/20/2017>>>>>> 09/22/2017  IV Zosyn 10/02/2017       Subjective: Patient sitting up in bed.  Alert.  Following commands.  Denies any chest pain or shortness of breath.  Coretrack has been discontinued.  No bleeding.  Objective: Vitals:   10/07/17 2000 10/07/17 2328 10/08/17 0343 10/08/17 0833  BP: (!) 157/84 133/90 (!) 143/71 139/84  Pulse: 74 (!) 142 71 98  Resp: 20 20 20 20   Temp: 99.6 F (37.6 C) 99 F (37.2 C) 98 F (36.7 C) 99.3 F (37.4 C)  TempSrc: Oral Oral Oral Axillary  SpO2: 100% 98% 99% 98%  Weight:   75.9 kg (167 lb 5.3 oz)   Height:        Intake/Output Summary (Last 24 hours) at 10/08/2017 1100 Last data filed at 10/07/2017 2100 Gross per 24 hour  Intake -  Output 800 ml  Net -800 ml   Filed Weights   10/07/17 0009 10/07/17 0500 10/08/17 0343  Weight: 76.6 kg (168 lb 14 oz) 77.6 kg (171 lb 1.2 oz) 75.9 kg (167 lb 5.3 oz)    Examination:  General exam: Alert.   Talking. Respiratory system: CTAB anterior lung fields.  No wheezing noted.  No rhonchi.  Cardiovascular system: Regular rate and rhythm no murmurs rubs or gallops.  No lower extremity edema.  No JVD.  Gastrointestinal system: Abdomen is nontender, nondistended, soft, positive bowel sounds.  No rebound.  No guarding.  Central nervous system: Alert.  Talking.  Interacting.  Moving extremities spontaneously.  Extremities: Symmetric 5 x 5 power. Skin: No rashes, lesions or ulcers Psychiatry: Judgement and insight appear fair. Mood & affect appropriate.     Data Reviewed: I have personally reviewed following labs and imaging studies  CBC: Recent Labs  Lab 10/04/17 0654  10/04/17 1909 10/05/17 0814 10/06/17 0129 10/07/17 0943 10/08/17 0517  WBC 13.4*  --   --  14.5* 13.7* 10.4 11.0*  HGB 7.6*   < > 7.5* 8.0* 8.3* 8.2* 7.9*  HCT 24.5*   < > 23.5* 24.7* 26.1* 25.9* 24.8*  MCV 98.0  --   --  96.1 96.3 96.3 97.3  PLT 270  --   --  307 349 379 417*   < > = values in this interval not displayed.   Basic Metabolic Panel: Recent Labs  Lab 10/02/17 0519  10/04/17 1601  10/05/17 2202 10/06/17 0129 10/07/17 0943 10/08/17 0517  NA 152*   < > 140 139 140 139 140  K 3.8   < > 4.0 3.4* 3.8 3.5 3.5  CL 117*   < > 110 111 113* 111 111  CO2 30   < > 20* 20* 20* 21* 22  GLUCOSE 121*   < > 107* 90 92 134* 97  BUN 30*   < > 23* 19 22* 15 17  CREATININE 0.73   < > 0.74 0.83 0.79 0.64 0.73  CALCIUM 8.4*   < > 8.8* 8.8* 8.7* 8.3* 8.4*  MG 2.3  --   --  1.8  --  1.8 1.8   < > = values in this interval not displayed.   GFR: Estimated Creatinine Clearance: 94.1 mL/min (by C-G formula based on SCr of 0.73 mg/dL). Liver Function Tests: Recent Labs  Lab 10/07/17 0943  AST 26  ALT 28  ALKPHOS 58  BILITOT 0.2*  PROT 6.4*  ALBUMIN 2.9*   No results for input(s): LIPASE, AMYLASE in the last 168 hours. Recent Labs  Lab 10/02/17 0804 10/03/17 0906 10/04/17 0407 10/05/17 0814  AMMONIA 142*  52* 37* 48*   Coagulation Profile: No results for input(s): INR, PROTIME in the last 168 hours. Cardiac Enzymes: No results for input(s): CKTOTAL, CKMB, CKMBINDEX, TROPONINI in the last 168 hours. BNP (last 3 results) No results for input(s): PROBNP in the last 8760 hours. HbA1C: No results for input(s): HGBA1C in the last 72 hours. CBG: Recent Labs  Lab 10/07/17 1557 10/07/17 2002 10/07/17 2325 10/08/17 0340 10/08/17 0840  GLUCAP 100* 93 87 98 108*   Lipid Profile: No results for input(s): CHOL, HDL, LDLCALC, TRIG, CHOLHDL, LDLDIRECT in the last 72 hours. Thyroid Function Tests: No results for input(s): TSH, T4TOTAL, FREET4, T3FREE, THYROIDAB in the last 72 hours. Anemia Panel: No results for input(s): VITAMINB12, FOLATE, FERRITIN, TIBC, IRON, RETICCTPCT in the last 72 hours. Sepsis Labs: No results for input(s): PROCALCITON, LATICACIDVEN in the last 168 hours.  No results found for this or any previous visit (from the past 240 hour(s)).       Radiology Studies: Dg Swallowing Func-speech Pathology  Result Date: 10/06/2017 Objective Swallowing Evaluation: Type of Study: MBS-Modified Barium Swallow Study  Patient Details Name: Pesach Frisch MRN: [id number] Date of Birth: [birth year] Today's Date: 10/06/2017 Time: SLP Start Time (ACUTE ONLY): 1115 -SLP Stop Time (ACUTE ONLY): 1135 SLP Time Calculation (min) (ACUTE ONLY): 20 min Past Medical History: Past Medical History: Diagnosis Date . Allergic rhinitis 08/02/2012 . Depression 08/02/2012 . Dysphagia, unspecified 08/02/2012 . GERD (gastroesophageal reflux disease) 08/02/2012 . Other and unspecified hyperlipidemia 08/02/2012 . Schizophrenia ([place]) 08/02/2012 . Vertebral compression fracture ([place]) 08/02/2012 Past Surgical History: Past Surgical History: Procedure Laterality Date . EXPLORATORY LAPAROTOMY    ??? (Has large midline incision) . LAPAROSCOPY N/A 09/22/2017  Procedure: LAPAROSCOPY DIAGNOSTIC  LYSIS OF ADHESIONS;  Surgeon: Michael Boston, MD;  Location: [facility] ORS;  Service: General;  Laterality: N/A; HPI: Patient is a 67 y.o. male with PMH: advanced dementia, schizophrenia, HTN, GERD, depression, dysphagia, who was brought to hospital from SNF secondary to persistent nausea with hematemesis. Chest x-ray at SNF showed some infiltrates and abdomen x-ray concering for bowel obstruction. Patient diagnosed at hospital with sepsis likely secondary to aspiration PNA, anemia, HTN, high-grade small bowel obstruction. on 6/1, he had laproscopy diagnositic lysis of adhesions.  Subjective: "I'm hungry. I wanna eat." Assessment / Plan / Recommendation CHL IP CLINICAL IMPRESSIONS  10/06/2017 Clinical Impression Patient presents with what appears to be adequate strength and timing during this evaluation, which was limited due to pt agitation. Pt took several boluses of puree and a single teaspoon of thin liquid; no penetration or aspiration observed. There was no oral holding, timely anterior to posterior transit. Swallow initiation was timely with complete epiglottic deflection and airway closure. No residue remained in the pharynx after the swallow. An esophageal sweep was performed and was unremarkable. Consistent with bedside assessment, pt was tachypneic, though this appeared to be due to anxiety and was not associated with airway compromise. Pt does remain at risk for aspiration primarily due to cognitive deficits and mentation. Recommend dys 1, thin liquids with full supervision; ensure pt is fully alert and assist with self-feeding as able. Will follow for tolerance, possible progression of solids. SLP Visit Diagnosis Dysphagia, oropharyngeal phase (R13.12) Attention and concentration deficit following -- Frontal lobe and executive function deficit following -- Impact on safety and function Mild aspiration risk;Moderate aspiration risk   CHL IP TREATMENT RECOMMENDATION 10/06/2017 Treatment Recommendations Therapy as outlined in treatment plan below    Prognosis 10/06/2017 Prognosis for Safe Diet Advancement Fair Barriers to Reach Goals Cognitive deficits;Behavior Barriers/Prognosis Comment -- CHL IP DIET RECOMMENDATION 10/06/2017 SLP Diet Recommendations Dysphagia 1 (Puree) solids;Thin liquid Liquid Administration via Spoon;Cup Medication Administration Crushed with puree Compensations Slow rate;Small sips/bites;Minimize environmental distractions Postural Changes Remain semi-upright after after feeds/meals (Comment);Seated upright at 90 degrees   CHL IP OTHER RECOMMENDATIONS 10/06/2017 Recommended Consults -- Oral Care Recommendations Oral care BID Other Recommendations --   CHL IP FOLLOW UP RECOMMENDATIONS 10/06/2017 Follow up Recommendations Other (comment)   CHL IP FREQUENCY AND DURATION 10/06/2017 Speech Therapy Frequency (ACUTE ONLY) min 2x/week Treatment Duration 2 weeks      CHL IP ORAL PHASE 10/06/2017 Oral Phase WFL Oral - Pudding Teaspoon -- Oral - Pudding Cup -- Oral - Honey Teaspoon -- Oral - Honey Cup -- Oral - Nectar Teaspoon -- Oral - Nectar Cup -- Oral - Nectar Straw -- Oral - Thin Teaspoon -- Oral - Thin Cup -- Oral - Thin Straw -- Oral - Puree -- Oral - Mech Soft -- Oral - Regular -- Oral - Multi-Consistency -- Oral - Pill -- Oral Phase - Comment --  CHL IP PHARYNGEAL PHASE 10/06/2017 Pharyngeal Phase WFL Pharyngeal- Pudding Teaspoon -- Pharyngeal -- Pharyngeal- Pudding Cup -- Pharyngeal -- Pharyngeal- Honey Teaspoon -- Pharyngeal -- Pharyngeal- Honey Cup -- Pharyngeal -- Pharyngeal- Nectar Teaspoon -- Pharyngeal -- Pharyngeal- Nectar Cup -- Pharyngeal -- Pharyngeal- Nectar Straw -- Pharyngeal -- Pharyngeal- Thin Teaspoon -- Pharyngeal -- Pharyngeal- Thin Cup -- Pharyngeal -- Pharyngeal- Thin Straw -- Pharyngeal -- Pharyngeal- Puree -- Pharyngeal -- Pharyngeal- Mechanical Soft -- Pharyngeal -- Pharyngeal- Regular -- Pharyngeal -- Pharyngeal- Multi-consistency -- Pharyngeal -- Pharyngeal- Pill -- Pharyngeal -- Pharyngeal Comment --  CHL IP CERVICAL  ESOPHAGEAL PHASE 10/06/2017 Cervical Esophageal Phase WFL Pudding Teaspoon -- Pudding Cup -- Honey Teaspoon -- Honey Cup -- Nectar Teaspoon -- Nectar Cup -- Nectar Straw -- Thin Teaspoon -- Thin Cup -- Thin Straw -- Puree -- Mechanical Soft -- Regular -- Multi-consistency -- Pill -- Cervical Esophageal Comment -- Deneise Lever, MS, CCC-SLP Speech-Language Pathologist [phone] No flowsheet data found. Aliene Altes 10/06/2017, 1:02 PM                   Scheduled Meds: . acetaminophen  650 mg Oral Once  . bisacodyl  10 mg Rectal Daily  .  diphenhydrAMINE  25 mg Oral Once  . feeding supplement (PRO-STAT SUGAR FREE 64)  30 mL Per Tube BID  . free water  200 mL Per Tube Q8H  . furosemide  20 mg Intravenous Once  . lactulose  300 mL Rectal BID  . mouth rinse  15 mL Mouth Rinse BID  . metoprolol tartrate  5 mg Intravenous Q8H  . polyethylene glycol  17 g Oral BID   Continuous Infusions: . sodium chloride    . chlorproMAZINE (THORAZINE) IV    . famotidine (PEPCID) IV 20 mg (10/08/17 1008)  . feeding supplement (OSMOLITE 1.5 CAL) 50 mL/hr at 10/06/17 0607  . lacosamide (VIMPAT) IV Stopped (10/07/17 2227)  . levETIRAcetam Stopped (10/08/17 0602)  . methocarbamol (ROBAXIN)  IV Stopped (09/26/17 0030)  . piperacillin-tazobactam (ZOSYN)  IV 3.375 g (10/08/17 0755)     LOS: 18 days    Time spent: 40 minutes    Irine Seal, MD [facility] Pager [phone]  If 7PM-7AM, please contact night-coverage [email or web address] Password [email or web address] 10/08/2017, 11:00 AM

## 2017-10-08 NOTE — Progress Notes (Signed)
Was called by RN, that patient post transfusion, 2 hours later, had temp of 102.7, HR in 150s and SBP in 170s, and agitated.  Came and assessed patient, and patient agitated, then calmed down Patient noted to be tachycardic and elevated BP. Gen: Agitated Resp: Coarse BS anterior lung fields. CV: Tachycardia GI: Abd soft/NT/ND/+BS Ext: No c/c/e  A/P Tachycardia/HTN/Fever Concern for transfusion reaction. Check blood cultures x 2, CBC, BMET, haptoglobin, Hepatic panel, LDH, peripheral smear. Supportive care.

## 2017-10-08 NOTE — Progress Notes (Signed)
RN called [phone] for pt wit increase RR of 22-24. Advised RN to page MD with DNR status. On arrival pt lying supine in bed, pt reports feeling SOB, no crackles or wheezing noted, BBS coarse through out. Pt receiving blood at 120 cc/hr started 1345, NS with KCL 55ml/hr, NS 20 ml/hr. Dr. Grandville Silos to bedside. There is an order for Lasix 20mg  IVP after blood transfusion complete. It was changed to 40 mg IVP per Dr. Grandville Silos. No interventions from me.

## 2017-10-08 NOTE — Progress Notes (Signed)
Patient having shortness of breathe. Respiration rate at 30.  O2 sat at 95% on 2L oxygen. Coarse breath sound with mild wheezing.  Rapid response call and Dr.  Grandville Silos notified.  Iv lasix given as per ordered.  Blood pressure elavate with SBP 178.  IV labatolol given per scheduled.  Blood transfusion stopped.  Patient felt better and respiration rate back to 20 and blood transfusion resumed per order by Dr Grandville Silos

## 2017-10-09 LAB — GLUCOSE, CAPILLARY
GLUCOSE-CAPILLARY: 109 mg/dL — AB (ref 65–99)
GLUCOSE-CAPILLARY: 93 mg/dL (ref 65–99)
GLUCOSE-CAPILLARY: 98 mg/dL (ref 65–99)
Glucose-Capillary: 101 mg/dL — ABNORMAL HIGH (ref 65–99)
Glucose-Capillary: 94 mg/dL (ref 65–99)
Glucose-Capillary: 92 mg/dL (ref 65–99)
Glucose-Capillary: 116 mg/dL — ABNORMAL HIGH (ref 65–99)
Glucose-Capillary: 95 mg/dL (ref 65–99)
Glucose-Capillary: 95 mg/dL (ref 65–99)

## 2017-10-09 LAB — CBC
HCT: 28.7 % — ABNORMAL LOW (ref 39.0–52.0)
HCT: 43.1 % (ref 39.0–52.0)
Hemoglobin: 9.4 g/dL — ABNORMAL LOW (ref 13.0–17.0)
Hemoglobin: 13.8 g/dL (ref 13.0–17.0)
MCH: 30.6 pg (ref 26.0–34.0)
MCH: 30.2 pg (ref 26.0–34.0)
MCHC: 32.8 g/dL (ref 30.0–36.0)
MCHC: 32 g/dL (ref 30.0–36.0)
MCV: 93.5 fL (ref 78.0–100.0)
MCV: 94.3 fL (ref 78.0–100.0)
Platelets: 397 10*3/uL (ref 150–400)
Platelets: 152 10*3/uL (ref 150–400)
RBC: 3.07 MIL/uL — ABNORMAL LOW (ref 4.22–5.81)
RBC: 4.57 MIL/uL (ref 4.22–5.81)
RDW: 16 % — ABNORMAL HIGH (ref 11.5–15.5)
RDW: 17.2 % — ABNORMAL HIGH (ref 11.5–15.5)
WBC: 16.6 10*3/uL — ABNORMAL HIGH (ref 4.0–10.5)
WBC: 14.5 10*3/uL — ABNORMAL HIGH (ref 4.0–10.5)

## 2017-10-09 LAB — COMPREHENSIVE METABOLIC PANEL
ALT: 28 U/L (ref 17–63)
AST: 36 U/L (ref 15–41)
Albumin: 3.1 g/dL — ABNORMAL LOW (ref 3.5–5.0)
Alkaline Phosphatase: 66 U/L (ref 38–126)
Anion gap: 9 (ref 5–15)
BUN: 19 mg/dL (ref 6–20)
CO2: 24 mmol/L (ref 22–32)
Calcium: 8.8 mg/dL — ABNORMAL LOW (ref 8.9–10.3)
Chloride: 107 mmol/L (ref 101–111)
Creatinine, Ser: 1.09 mg/dL (ref 0.61–1.24)
GFR calc Af Amer: >60 mL/min (ref >60)
GFR calc non Af Amer: >60 mL/min (ref >60)
Glucose, Bld: 90 mg/dL (ref 65–99)
Potassium: 4.3 mmol/L (ref 3.5–5.1)
Sodium: 140 mmol/L (ref 135–145)
Total Bilirubin: 0.8 mg/dL (ref 0.3–1.2)
Total Protein: 6.9 g/dL (ref 6.5–8.1)

## 2017-10-09 LAB — TYPE AND SCREEN
ABO/RH(D): O POS
Antibody Screen: POSITIVE
DAT, IgG: NEGATIVE
DONOR AG TYPE: NEGATIVE
UNIT DIVISION: 0

## 2017-10-09 LAB — BASIC METABOLIC PANEL
ANION GAP: 7 (ref 5–15)
BUN: 18 mg/dL (ref 6–20)
CHLORIDE: 107 mmol/L (ref 101–111)
CO2: 25 mmol/L (ref 22–32)
Calcium: 8.4 mg/dL — ABNORMAL LOW (ref 8.9–10.3)
Creatinine, Ser: 1.05 mg/dL (ref 0.61–1.24)
GFR calc Af Amer: >60 mL/min (ref >60)
GFR calc non Af Amer: >60 mL/min (ref >60)
Glucose, Bld: 213 mg/dL — ABNORMAL HIGH (ref 65–99)
POTASSIUM: 4.4 mmol/L (ref 3.5–5.1)
SODIUM: 139 mmol/L (ref 135–145)

## 2017-10-09 LAB — BPAM RBC
Blood Product Expiration Date: 201907082359
ISSUE DATE / TIME: 201906171307
UNIT TYPE AND RH: 9500

## 2017-10-09 MED ORDER — LACTULOSE 10 GM/15ML PO SOLN
20.0000 g | Freq: Every day | ORAL | Status: DC
  Administered 2017-10-09: 20 g via ORAL
  Filled 2017-10-09: qty 30

## 2017-10-09 NOTE — Progress Notes (Signed)
Palliative:  Kevin Humphrey' is more pleasant today again and not as agitated. I believe agitation was from lactulose enema - recommended to change lactulose to po given his alertness and acceptance of liquids now. He is pleasantly confused. Denies pain or discomfort. He enjoys watching cartoons on television. Discussed plan with RN to minimize invasive interventions to allow for decreased agitation and for comfort to Kevin Humphrey. Again, would be poor candidate for artificial feeding. Palliative care will shadow chart. Please call for further decline and palliative needs or decisions to be made with APS. Hopefully will be able to transition back to [facility] with outpatient palliative to follow. He has been on hospice care previously but recovered.   15 min  Vinie Sill, NP Palliative Medicine Team Pager # [phone] (M-F 8a-5p) Team Phone # [phone] (Nights/Weekends)

## 2017-10-09 NOTE — Progress Notes (Addendum)
PROGRESS NOTE    Kevin Humphrey  IWP:[id number] DOB: October 14, 1950 DOA: 09/19/2017 PCP: [name], MD    Brief Narrative:  67 y.o.malewithhistory of advanced dementia, schizophrenia, hypertension was brought from the skilled nursing facility after patient was found to have persistent nausea vomiting with hematemesis. Chest x-ray done at this nursing facility was showing some infiltrates and abdomen x-ray was showing concerning for bowel obstruction.  In the ER stool for occult blood was positive. Abdomen was distended and initially patient on arrival was confused and minimally responsive but somewhat improved after starting antibiotics and fluids. CT abdomen pelvis shows bowel obstruction with transition point. Chest x-ray was showing possibility of infiltrates. Patient was hypotensive improved with fluids and empiric antibiotic started for sepsis likely from aspiration and found to have a UTI as well.   Underwent Laparoscopic Lysis of Adhesions for SBO and improved butcontinued to beEncephalopathic and not following commands. Because of his AMS an MRI and EEG were ordered and Neurology was consulted to evaluate and make recommendations      Assessment & Plan:   Principal Problem:   Acute metabolic encephalopathy Active Problems:   Acute respiratory failure with hypoxia (HCC)   Volume overload   GERD (gastroesophageal reflux disease)   Schizophrenia (HCC)   Malnutrition of moderate degree (HCC)   SBO (small bowel obstruction) s/p lap adhesiolysis 09/22/2017   ARF (acute renal failure) (HCC)   Hematemesis   Sepsis ([place])   Anxiety state   Small bowel obstruction (HCC)   Hypomagnesemia   Acute lower UTI   Ileus (HCC)   Hypokalemia   Hyperammonemia (HCC)   Agitation   Decreased oral intake  High-grade small bowel obstruction that is post laparoscopic lysis of adhesions 09/22/2017 with subsequent Ileus -Pt presented with small bowel obstruction secondary to  adhesions.  -Patient status post laparoscopic lysis of adhesions 09/22/2017 per [name].  -Patienthadileus with no bowel movements and no flatus and hypoactive bowel sounds as of 09/23/2017.  -Patient initially started on clears with plans to advance diet as tolerated -Patient noted to have concerns for depressed consciousness and even seizure activity.  Patient subsequent transferred from [facility] hospital to [facility].  See below -Continue to aim to keep magnesium greater than 2. Keep potassium greater than 4.  -Patient was being followed by general surgery who have subsequently signed off and recommended outpatient follow-up if patient when discharged.   Acute respiratory failure with hypoxia secondary to volume overload -Patient noted to have worsening shortness of breaththe night of 09/23/2017 to the morning of 09/24/2017,with increased O2 requirements requiring 8 L of high flow nasal cannula.  -follow up chest x-ray obtained consistent with volume overload.  -Patient was diuresed with IV Lasix with good response.  Patient also noted overnight on 10/03/2017, to have likely aspirated.  Patient did receive a dose of IV Lasix.Patient started back on empiric IV antibiotics of Zosyn.  We will discontinue IV Zosyn. Follow.  Acute Metabolic Encephalopathy in the setting of Advanced Dementia secondary to hypernatremia and elevated ammonia levels -Suspicion for medication induced encephalopathy versus metabolic secondary to hypernatremia and elevated ammonia levels.. -Patient improved clinically and currently are alert and talking and following commands appropriately and moving extremities.  Mentation may be close to baseline. -Obtained Head CT w/o Contrast: Showed No acute intracranial abnormality. Mild cerebral atrophy. Chronic sinusitis. -Appreciate input by palliative care service.  Overall prognosis seems to be poor a few days ago however patient now alert.  -Tube  feeds have  been discontinued due to concerns for aspiration pneumonia.  Tube feeds were resumed as patient noted to be more alert.  Will discontinue tube feeds as patient started on oral diet. -Concerns for possible continued seizure activity. Discussed with Neurology. AEDs titrated by Neurology. Continue to monitor.  Initial plans for burst suppression in ICU, now plans for gradually weaning sedating meds and trying to allow pt to wake per Neurology -MRI x2 have been done which have been negative for any acute abnormalities. -Ammonia levels trended down after lactulose was changed to lactulose enemas. Sodium levels have improved. NSL IVF.  Discontinue lactulose enemas.  Follow.     Acute Renal Failure -Likely secondary to prerenal azotemia in the setting of ACE inhibitor.  -Patient was hypotensive on admission which has subsequently resolved.  Patient hydrated with IV fluids.  Renal function improved.  Will not resume ACE inhibitor on discharge.  Sepsis likely secondary to aspiration pneumonia and Diphtheroids UTI -Per CT abdomen and pelvis as well as chest x-ray concerning for probable pneumonia.  -Patient on admission noted to be septic with hypotension, acute renal failure, tachycardia, elevated lactic acid level, elevated procalcitonin level.  -Lactic acid level and pro calcitonin levels trending down.  -Urine cultures with >100000 colonies ofDiphtheroids.  -Blood cultures pending with no growth to date. IV vancomycin was discontinued.  -completed course of IV Zosyn -Night of 10/02/2017, patient noted to go into respiratory distress with concerns for aspiration pneumonia.  Tube feeds discontinued.  Patient started on IV Zosyn which we will continue to complete a 7-day course.    Anemia/Hematemesis -Patient on admission was noted to have some hematemesis however no hematemesis noted during the hospitalization.  -Patient with no overt bleeding.  -Anemia panel has been checked and iron  level at 11. Ferritin at 142. Vitamin B12 levels at 282.  -Continue vitamin B12 IM injections.  -We will been dropped as low as 7.9.  Patient transfused a unit packed red blood cells.  Hemoglobin now at 13.8.  Felt decrease in hemoglobin may have likely been a dilutional effect.  Patient status post IV Feraheme.  Transfusion threshold hemoglobin less than 8.  Continue PPI.  Follow.   Hypertension -On admission patient noted to be septic with hypotension.  -Medications were held.  Blood pressure improved.  Continue current regimen of IV Lopressor.  Diuretics discontinued.  Continue IV Lopressor.  Schizophrenia -Patient had been on IV Depakote 250 mg q8h.  -Patient alert following commands. IV Depakote has been discontinued.  Patient currently on Vimpat and Keppra which we will continue for now.  Once patient tolerating oral intake and continues to be alert could likely transition to oral Vimpat and Keppra.  We will not resume Depakote per neurology recommendations.      Hypomagnesemia -Repleted.  Follow.   Hypernatremia -Likely secondary to volume depletion.  Patient with worsening hypernatremia 7 days ago.  Hypernatremia resolved with D5W.  Patient received a dose of IV Lasix 8 days ago, due to concern for volume overload.  Patient also with low albumin levels.  Tube feeds were discontinued due to concerns for aspiration.  Patient also given IV albumin x24 hours.  Saline lock IV fluids.  Follow.    Hypokalemia -Repleted.  Follow.    Abnormal LFTs -Resolved.  Follow.    Hyperbilirubinemia -Mild at 1.7.  Bilirubin down to 0.8.  Follow.  Thrombocytosis -Likely reactive in the setting of aspiration infection along with UTI -Stable.  Follow.    Nutrition  Patient noted to have a cortrack placed and was on tube feeds however due to concerns for aspiration pneumonia tube feeds were discontinued.  Patient more alert and interactive and talking.  Speech therapy has assessed  patient and patient underwent a modified barium swallow on 10/06/2017.  Patient has started on a dysphagia 1 diet.  Discontinued coretrack.  Discontinued tube feeds.  Dietitian following.  Fever/tachycardia 2 to 3 hours posttransfusion on 10/08/2017 patient noted to have a fever with temperatures as high as 102.7.  Patient also noted to have a leukocytosis.  Patient also noted to be hypertensive which has since improved.  Hepatic panel obtained had a bilirubin of 1.7.  Haptoglobin was pending.  LDH of 467.  No significant signs of hemolysis.  Patient pancultured and results pending.  Patient was empirically on IV Zosyn.  Discontinue Zosyn.  Fever curve is trended down and is currently afebrile.  Tachycardia has resolved.  Hypertension has resolved.  Supportive care.   Volume overload Patient noted to be volume overloaded by receiving a unit of packed red blood cells on 10/08/2017.  Patient at that time noted to have increased respiratory rate.  Chest x-ray done was consistent with volume overload.  Patient placed on IV Lasix with 2.050 L.  Patient clinically improved.  Monitor.     DVT prophylaxis:SCDs Code Status: DNR Family Communication: No family at bedside.  Tried calling guardian but could not get through. Disposition Plan: Back to skilled nursing facility with palliative care following when clinically stable hopefully the next 2 to 3 days.     Consultants:   General surgery: Dr. Marcello Moores 09/20/2017  Palliative care: Jobe Gibbon, NP/Dr. Rowe Pavy 09/21/2017  Neurology: Dr. Cheral Marker 09/27/2017      Procedures:   CT abdomen and pelvis 09/20/2017  Chest x-ray 09/20/2017, 09/24/2017  Abdominal films 09/20/2017, 09/22/2017  Laparoscopic lysis of adhesions per [name] 09/22/2017  CT head 09/26/2017  Chest x-ray 09/24/2017, 09/25/2017, 10/02/2017  MRI brain 09/27/2017, 10/02/2017  Chest x-ray 10/04/2017   Abdominal films 10/04/2017    Modified barium swallow  10/06/2017      Antimicrobials:   IV Zosyn 09/20/2017>>> 09/29/2017  IV vancomycin 09/20/2017>>>>>> 09/22/2017  IV Zosyn 10/02/2017>>>>>>> 10/09/2017       Subjective: Patient in bed.  Alert.  Following commands.  No chest pain.  No shortness of breath.  Patient states he ate breakfast well today.  No agitation noted today.    Objective: Vitals:   10/09/17 0328 10/09/17 0444 10/09/17 0931 10/09/17 1303  BP: (!) 122/59  138/63 (!) 142/81  Pulse: 88  80 78  Resp: 18  18 18   Temp: 100.2 F (37.9 C)  97.8 F (36.6 C) 97.7 F (36.5 C)  TempSrc: Axillary  Oral Oral  SpO2: 100%  100% 100%  Weight: 75.8 kg (167 lb 1.7 oz) 75.8 kg (167 lb 1.7 oz)    Height:        Intake/Output Summary (Last 24 hours) at 10/09/2017 1335 Last data filed at 10/09/2017 1058 Gross per 24 hour  Intake 580 ml  Output 2050 ml  Net -1470 ml   Filed Weights   10/08/17 0343 10/09/17 0328 10/09/17 0444  Weight: 75.9 kg (167 lb 5.3 oz) 75.8 kg (167 lb 1.7 oz) 75.8 kg (167 lb 1.7 oz)    Examination:  General exam: Alert.  Talking. Respiratory system: Lungs clear to auscultation bilaterally anterior lung fields.  No wheezes, no crackles, no rhonchi.  Cardiovascular system: RRR murmurs rubs or gallops.  No lower extremity edema.  No JVD.   Gastrointestinal system: Abdomen is soft, nontender, nondistended, positive bowel sounds.  No rebound.  No guarding.   Central nervous system: Alert.  Talking.  Interacting.  Moving extremities spontaneously.  Extremities: Symmetric 5 x 5 power. Skin: No rashes, lesions or ulcers Psychiatry: Judgement and insight appear fair. Mood & affect appropriate.     Data Reviewed: I have personally reviewed following labs and imaging studies  CBC: Recent Labs  Lab 10/07/17 0943 10/08/17 0517 10/08/17 2055 10/09/17 0518 10/09/17 0553  WBC 10.4 11.0* 22.1* 16.6* 14.5*  NEUTROABS  --   --  17.7*  --   --   HGB 8.2* 7.9* 10.5* 9.4* 13.8  HCT 25.9* 24.8* 31.7* 28.7* 43.1   MCV 96.3 97.3 91.9 93.5 94.3  PLT 379 417* 355 397 323   Basic Metabolic Panel: Recent Labs  Lab 10/05/17 0814  10/07/17 0943 10/08/17 0517 10/08/17 2055 10/09/17 0518 10/09/17 0553  NA 139   < > 139 140 139 139 140  K 3.4*   < > 3.5 3.5 5.1 4.4 4.3  CL 111   < > 111 111 109 107 107  CO2 20*   < > 21* 22 19* 25 24  GLUCOSE 90   < > 134* 97 117* 213* 90  BUN 19   < > 15 17 23* 18 19  CREATININE 0.83   < > 0.64 0.73 1.32* 1.05 1.09  CALCIUM 8.8*   < > 8.3* 8.4* 8.9 8.4* 8.8*  MG 1.8  --  1.8 1.8  --   --   --    < > = values in this interval not displayed.   GFR: Estimated Creatinine Clearance: 69.1 mL/min (by C-G formula based on SCr of 1.09 mg/dL). Liver Function Tests: Recent Labs  Lab 10/07/17 0943 10/08/17 2055 10/09/17 0553  AST 26 57* 36  ALT 28 34 28  ALKPHOS 58 74 66  BILITOT 0.2* 1.7* 0.8  PROT 6.4* 7.0 6.9  ALBUMIN 2.9* 3.3* 3.1*   No results for input(s): LIPASE, AMYLASE in the last 168 hours. Recent Labs  Lab 10/03/17 0906 10/04/17 0407 10/05/17 0814  AMMONIA 52* 37* 48*   Coagulation Profile: No results for input(s): INR, PROTIME in the last 168 hours. Cardiac Enzymes: No results for input(s): CKTOTAL, CKMB, CKMBINDEX, TROPONINI in the last 168 hours. BNP (last 3 results) No results for input(s): PROBNP in the last 8760 hours. HbA1C: No results for input(s): HGBA1C in the last 72 hours. CBG: Recent Labs  Lab 10/08/17 1947 10/08/17 2305 10/09/17 0334 10/09/17 0752 10/09/17 1207  GLUCAP 124* 98 94 92 116*   Lipid Profile: No results for input(s): CHOL, HDL, LDLCALC, TRIG, CHOLHDL, LDLDIRECT in the last 72 hours. Thyroid Function Tests: No results for input(s): TSH, T4TOTAL, FREET4, T3FREE, THYROIDAB in the last 72 hours. Anemia Panel: No results for input(s): VITAMINB12, FOLATE, FERRITIN, TIBC, IRON, RETICCTPCT in the last 72 hours. Sepsis Labs: No results for input(s): PROCALCITON, LATICACIDVEN in the last 168 hours.  No results  found for this or any previous visit (from the past 240 hour(s)).       Radiology Studies: Dg Chest Port 1 View  Result Date: 10/08/2017 CLINICAL DATA:  Sudden onset of shortness of breath and respiratory distress today. Clinical concerns discern over possible volume overload. EXAM: PORTABLE CHEST 1 VIEW COMPARISON:  Portable chest x-ray of Sep 03, 2017 FINDINGS: The lungs are adequately inflated. The interstitial markings are increased. The  pulmonary vascularity is engorged and indistinct. The cardiac silhouette is enlarged. There is no pleural effusion. There is calcification in the wall of the thoracic aortic arch. Old right lateral rib deformities are present and stable. IMPRESSION: Findings worrisome for pulmonary interstitial and early alveolar edema. The appearance of the chest has deteriorated slightly since the previous study. Thoracic aortic atherosclerosis. Electronically Signed   By: David  Martinique M.D.   On: 10/08/2017 15:22        Scheduled Meds: . bisacodyl  10 mg Rectal Daily  . feeding supplement (PRO-STAT SUGAR FREE 64)  30 mL Per Tube BID  . lactulose  20 g Oral Daily  . mouth rinse  15 mL Mouth Rinse BID  . metoprolol tartrate  5 mg Intravenous Q8H  . polyethylene glycol  17 g Oral BID   Continuous Infusions: . chlorproMAZINE (THORAZINE) IV    . famotidine (PEPCID) IV 20 mg (10/09/17 1058)  . lacosamide (VIMPAT) IV 200 mg (10/09/17 1053)  . levETIRAcetam Stopped (10/09/17 1540)  . methocarbamol (ROBAXIN)  IV Stopped (09/26/17 0030)     LOS: 19 days    Time spent: 35 minutes    Irine Seal, MD [facility] Pager [phone]  If 7PM-7AM, please contact night-coverage [email or web address] Password [email or web address] 10/09/2017, 1:35 PM

## 2017-10-09 NOTE — Progress Notes (Signed)
  Speech Language Pathology Treatment:    Patient Details Name: Tanis Burnley MRN: [id number] DOB: [birth year] Today's Date: 10/09/2017 Time: 3007-6226 SLP Time Calculation (min) (ACUTE ONLY): 15 min  Assessment / Plan / Recommendation Clinical Impression  Although pt with fevers currently, he demonstrates no indications of aspiration/airway compromise with intake.  Pt accepted only tea via straw, grape juice via straw, single bite of mashed potatoes and 1/2 of magic cup.  Prolonged oral transiting with magic cup noted but no oral pocketing.  Pt clearly dislikes the pureed foods but admits he likes Grits and would "try" Boost.  He states he does not like Ensure!  SLP is concerned for adequacy of po intake at this time.  Recommend follow up with RD to help maximize nutrition - ? If pt would also take a liquid supplement in addition to magic cup.  Will follow up for dysphagia management/readiness for dietary advancement.Marland Kitchen     HPI HPI: Patient is a 67 y.o. male with PMH: advanced dementia, schizophrenia, HTN, GERD, depression, dysphagia, who was brought to hospital from SNF secondary to persistent nausea with hematemesis. Chest x-ray at SNF showed some infiltrates and abdomen x-ray concering for bowel obstruction. Patient diagnosed at hospital with sepsis likely secondary to aspiration PNA, anemia, HTN, high-grade small bowel obstruction. on 6/1, he had laproscopy diagnositic lysis of adhesions.  Pt today much more alert with oral cavity clear.  Today seen to assess po tolerance and readiness for dietary advancement.        SLP Plan  Continue with current plan of care       Recommendations  Diet recommendations: Dysphagia 1 (puree);Thin liquid Liquids provided via: Cup;Straw Medication Administration: Crushed with puree Supervision: Full supervision/cueing for compensatory strategies Compensations: Slow rate;Small sips/bites;Minimize environmental distractions Postural Changes and/or Swallow  Maneuvers: Seated upright 90 degrees;Upright 30-60 min after meal                Oral Care Recommendations: Oral care BID Follow up Recommendations: Skilled Nursing facility SLP Visit Diagnosis: Dysphagia, oropharyngeal phase (R13.12) Plan: Continue with current plan of care       GO                Macario Golds 10/09/2017, 12:23 PM  Luanna Salk, [place] [facility] SLP [phone]

## 2017-10-10 DIAGNOSIS — E8770 Fluid overload, unspecified: Secondary | ICD-10-CM

## 2017-10-10 DIAGNOSIS — J690 Pneumonitis due to inhalation of food and vomit: Secondary | ICD-10-CM

## 2017-10-10 LAB — BASIC METABOLIC PANEL
ANION GAP: 6 (ref 5–15)
BUN: 19 mg/dL (ref 6–20)
CO2: 24 mmol/L (ref 22–32)
Calcium: 8.6 mg/dL — ABNORMAL LOW (ref 8.9–10.3)
Chloride: 109 mmol/L (ref 101–111)
Creatinine, Ser: 0.68 mg/dL (ref 0.61–1.24)
Glucose, Bld: 90 mg/dL (ref 65–99)
Potassium: 3.3 mmol/L — ABNORMAL LOW (ref 3.5–5.1)
SODIUM: 139 mmol/L (ref 135–145)

## 2017-10-10 LAB — GLUCOSE, CAPILLARY
GLUCOSE-CAPILLARY: 95 mg/dL (ref 65–99)
Glucose-Capillary: 95 mg/dL (ref 65–99)
Glucose-Capillary: 104 mg/dL — ABNORMAL HIGH (ref 65–99)

## 2017-10-10 LAB — PATHOLOGIST SMEAR REVIEW

## 2017-10-10 LAB — HAPTOGLOBIN: Haptoglobin: 394 mg/dL — ABNORMAL HIGH (ref 34–200)

## 2017-10-10 LAB — CBC WITH DIFFERENTIAL/PLATELET
Abs Immature Granulocytes: 0.1 10*3/uL (ref 0.0–0.1)
BASOS ABS: 0.1 10*3/uL (ref 0.0–0.1)
Basophils Relative: 1 %
EOS ABS: 0.6 10*3/uL (ref 0.0–0.7)
EOS PCT: 5 %
HCT: 29.8 % — ABNORMAL LOW (ref 39.0–52.0)
HEMOGLOBIN: 9.8 g/dL — AB (ref 13.0–17.0)
IMMATURE GRANULOCYTES: 1 %
LYMPHS PCT: 20 %
Lymphs Abs: 2.4 10*3/uL (ref 0.7–4.0)
MCH: 31 pg (ref 26.0–34.0)
MCHC: 32.9 g/dL (ref 30.0–36.0)
MCV: 94.3 fL (ref 78.0–100.0)
Monocytes Absolute: 1.6 10*3/uL — ABNORMAL HIGH (ref 0.1–1.0)
Monocytes Relative: 13 %
NEUTROS PCT: 60 %
Neutro Abs: 7.1 10*3/uL (ref 1.7–7.7)
Platelets: 484 10*3/uL — ABNORMAL HIGH (ref 150–400)
RBC: 3.16 MIL/uL — AB (ref 4.22–5.81)
RDW: 16.1 % — AB (ref 11.5–15.5)
WBC: 11.8 10*3/uL — AB (ref 4.0–10.5)

## 2017-10-10 LAB — MAGNESIUM: MAGNESIUM: 1.7 mg/dL (ref 1.7–2.4)

## 2017-10-10 MED ORDER — ENSURE ENLIVE PO LIQD: 237.0000 mL | Freq: Two times a day (BID) | ORAL | 12 refills | Status: AC

## 2017-10-10 MED ORDER — LACOSAMIDE 200 MG PO TABS: 200.0000 mg | ORAL_TABLET | Freq: Two times a day (BID) | ORAL | Status: DC

## 2017-10-10 MED ORDER — ENSURE ENLIVE PO LIQD
237.0000 mL | Freq: Two times a day (BID) | ORAL | Status: DC
  Administered 2017-10-10: 237 mL via ORAL

## 2017-10-10 MED ORDER — LEVETIRACETAM 500 MG PO TABS: 1500.0000 mg | ORAL_TABLET | Freq: Two times a day (BID) | ORAL | Status: DC

## 2017-10-10 MED ORDER — ACETAMINOPHEN 325 MG PO TABS: 650.0000 mg | ORAL_TABLET | Freq: Four times a day (QID) | ORAL | Status: AC | PRN

## 2017-10-10 MED ORDER — POTASSIUM CHLORIDE CRYS ER 10 MEQ PO TBCR
40.0000 meq | EXTENDED_RELEASE_TABLET | Freq: Once | ORAL | Status: AC
  Administered 2017-10-10: 40 meq via ORAL
  Filled 2017-10-10: qty 4

## 2017-10-10 MED ORDER — POLYETHYLENE GLYCOL 3350 17 G PO PACK: 17.0000 g | PACK | Freq: Two times a day (BID) | ORAL | 0 refills | Status: AC

## 2017-10-10 NOTE — Care Management Note (Signed)
Case Management Note  Patient Details  Name: Kevin Humphrey MRN: [id number] Date of Birth: May 08, 1950  Subjective/Objective:                    Action/Plan: Pt discharging back to [place] today. CM signing off.   Expected Discharge Date:  10/10/17               Expected Discharge Plan:  Skilled Nursing Facility  In-House Referral:  Clinical Social Work  Discharge planning Services  CM Consult  Post Acute Care Choice:    Choice offered to:     DME Arranged:    DME Agency:     HH Arranged:    [place] Agency:     Status of Service:  Completed, signed off  If discussed at [employer], dates discussed:    Additional Comments:  Pollie Friar, RN 10/10/2017, 2:18 PM

## 2017-10-10 NOTE — Progress Notes (Signed)
Discharge to: [facility] Anticipated discharge date: 10/10/17 Family notified: Voicemail left for legal guardian Transportation by: [facility]  Report #: [phone]  [place] signing off.  Laveda Abbe LCSW [phone]

## 2017-10-10 NOTE — NC FL2 (Signed)
[place] LEVEL OF CARE SCREENING TOOL     IDENTIFICATION  Patient Name: Kevin Humphrey Birthdate: 09/30/1950 Sex: male Admission Date (Current Location): 09/19/2017  [facility] and [place] Number:  Herbalist and Address:  [facility], [place] [address] [place], [place] [address]      Provider Number: [id number]  Attending Physician Name and Address:  Jonetta Osgood, MD  Relative Name and Phone Number:       Current Level of Care: Hospital Recommended Level of Care: [place] Prior Approval Number:    Date Approved/Denied:   PASRR Number:    Discharge Plan: SNF    Current Diagnoses: Patient Active Problem List   Diagnosis Date Noted  . Agitation   . Decreased oral intake   . Hyperammonemia ([place])   . Volume overload 09/24/2017  . Hypokalemia   . Acute metabolic encephalopathy 32/67/1245  . Ileus ([place]) 09/23/2017  . Acute lower UTI 09/22/2017  . Anxiety state 09/21/2017  . Small bowel obstruction ([place])   . Hypomagnesemia   . SBO (small bowel obstruction) s/p lap adhesiolysis 09/22/2017 09/20/2017  . ARF (acute renal failure) ([place]) 09/20/2017  . Hematemesis 09/20/2017  . Sepsis ([place]) 09/20/2017  . HCAP (healthcare-associated pneumonia)   . Ventilator dependent ([place])   . Acute cystitis with hematuria   . Bacteremia   . Accelerated hypertension   . Anemia, chronic disease   . Acute kidney injury ([place])   . Hypernatremia   . Protein-calorie malnutrition ([place])   . Acute encephalopathy   . Palliative care encounter 12/17/2014  . Dysphagia 12/17/2014  . Acute respiratory failure with hypoxia ([place]) 12/14/2014  . Staphylococcal pneumonia ([place]) 12/14/2014  . Septic shock ([place]) 12/14/2014  . AKI (acute kidney injury) ([place]) 12/14/2014  . Respiratory failure ([place])   . Pneumonia 12/12/2014  . Malnutrition of moderate degree ([place]) 12/12/2014  . Pressure ulcer 12/12/2014  . Convulsions/seizures ([place])  08/20/2013  . Other and unspecified hyperlipidemia 08/02/2012  . GERD (gastroesophageal reflux disease) 08/02/2012  . Schizophrenia ([place]) 08/02/2012  . Depression 08/02/2012  . Allergic rhinitis 08/02/2012  . Dysphagia, unspecified(787.20) 08/02/2012  . Vertebral compression fracture ([place]) 08/02/2012    Orientation RESPIRATION BLADDER Height & Weight     (disoriented x4)  O2([place] 2L) Incontinent, Indwelling catheter Weight: 160 lb 0.9 oz (72.6 kg) Height:  5\' 11"  (180.3 cm)  BEHAVIORAL SYMPTOMS/MOOD NEUROLOGICAL BOWEL NUTRITION STATUS    Convulsions/Seizures Incontinent Diet(puree solids, thin liquids)  AMBULATORY STATUS COMMUNICATION OF NEEDS Skin   Total Care Verbally Skin abrasions                       Personal Care Assistance Level of Assistance  Bathing, Feeding, Dressing Bathing Assistance: Maximum assistance Feeding assistance: Maximum assistance Dressing Assistance: Maximum assistance     Functional Limitations Info  Sight, Hearing, Speech Sight Info: Adequate Hearing Info: Adequate Speech Info: Adequate    SPECIAL CARE FACTORS FREQUENCY  Speech therapy             Speech Therapy Frequency: 5x/wk      Contractures Contractures Info: Not present    Additional Factors Info  Code Status, Allergies, Isolation Precautions Code Status Info: DNR Allergies Info: NKA     Isolation Precautions Info: Contact, MRSA     Current Medications (10/10/2017):  This is the current hospital active medication list Current Facility-Administered Medications  Medication Dose Route Frequency Provider Last Rate Last Dose  .  acetaminophen (TYLENOL) suppository 650 mg  650 mg Rectal Q6H PRN Eugenie Filler, MD   650 mg at 10/08/17 1911  . acetaminophen (TYLENOL) tablet 650 mg  650 mg Oral Q4H PRN Eugenie Filler, MD   650 mg at 10/09/17 0344  . bisacodyl (DULCOLAX) suppository 10 mg  10 mg Rectal Daily Eugenie Filler, MD   10 mg at 10/10/17 0835  . chlorproMAZINE  (THORAZINE) 25 mg in sodium chloride 0.9 % 25 mL IVPB  25 mg Intravenous Q6H PRN Eugenie Filler, MD      . famotidine (PEPCID) IVPB 20 mg premix  20 mg Intravenous Q12H Eugenie Filler, MD 100 mL/hr at 10/09/17 2215 20 mg at 10/09/17 2215  . feeding supplement (ENSURE ENLIVE) (ENSURE ENLIVE) liquid 237 mL  237 mL Oral BID BM Ghimire, Shanker M, MD      . hydrALAZINE (APRESOLINE) injection 10 mg  10 mg Intravenous Q4H PRN Eugenie Filler, MD   10 mg at 10/08/17 1929  . ibuprofen (ADVIL,MOTRIN) tablet 400 mg  400 mg Oral Q6H PRN Eugenie Filler, MD      . lacosamide (VIMPAT) 200 mg in sodium chloride 0.9 % 25 mL IVPB  200 mg Intravenous Q12H Eugenie Filler, MD 90 mL/hr at 10/10/17 0834 200 mg at 10/10/17 0834  . levETIRAcetam (KEPPRA) IVPB 1500 mg/ 100 mL premix  1,500 mg Intravenous Q12H Eugenie Filler, MD 400 mL/hr at 10/10/17 0649 1,500 mg at 10/10/17 0649  . MEDLINE mouth rinse  15 mL Mouth Rinse BID Eugenie Filler, MD   15 mL at 10/10/17 0835  . methocarbamol (ROBAXIN) 1,000 mg in dextrose 5 % 50 mL IVPB  1,000 mg Intravenous Q6H PRN Eugenie Filler, MD   Stopped at 09/26/17 0030  . metoprolol tartrate (LOPRESSOR) injection 5 mg  5 mg Intravenous Q8H Eugenie Filler, MD   5 mg at 10/10/17 9833  . morphine 2 MG/ML injection 0.5-1 mg  0.5-1 mg Intravenous Q4H PRN Eugenie Filler, MD   1 mg at 10/08/17 1521  . ondansetron (ZOFRAN) tablet 4 mg  4 mg Oral Q6H PRN Eugenie Filler, MD       Or  . ondansetron [facility]) injection 4 mg  4 mg Intravenous Q6H PRN Eugenie Filler, MD   4 mg at 09/25/17 0525  . polyethylene glycol (MIRALAX / GLYCOLAX) packet 17 g  17 g Oral BID Eugenie Filler, MD   17 g at 10/10/17 0835  . prochlorperazine (COMPAZINE) injection 5-10 mg  5-10 mg Intravenous Q4H PRN Eugenie Filler, MD   5 mg at 09/25/17 0408     Discharge Medications: Please see discharge summary for a list of discharge medications.  Relevant Imaging  Results:  Relevant Lab Results:   Additional Information SS#: [id number]  Geralynn Ochs, LCSW

## 2017-10-10 NOTE — Discharge Summary (Signed)
PATIENT DETAILS Name: Kevin Humphrey Age: 67 y.o. Sex: male Date of Birth: 1950-06-15 MRN: [id number]. Admitting Physician: Rise Patience, MD YHC:[id number] Janifer Adie, MD  Admit Date: 09/19/2017 Discharge date: 10/10/2017  Recommendations for Outpatient Follow-up:  1. Follow up with PCP in 1-2 weeks 2. Please obtain BMP/CBC in one week 3. All psychiatric medications on hold-please ensure outpatient follow-up with his primary psychiatrist. 4. Ensure therapy follow-up at SNF 5. Palliative care follow-up at SNF  Admitted From:  SNF  Disposition: SNF   Home Health: No  Equipment/Devices: None  Discharge Condition: Stable  CODE STATUS: DNR  Diet recommendation:    Diet recommendations: Dysphagia 1 (puree);Thin liquid Liquids provided via: Cup;Straw Medication Administration: Crushed with puree Supervision: Full supervision/cueing for compensatory strategies Compensations: Slow rate;Small sips/bites;Minimize environmental distractions Postural Changes and/or Swallow Maneuvers: Seated upright 90 degrees;Upright 30-60 min after meal      Brief Summary: See H&P, Labs, Consult and Test reports for all details in brief, patient is a 67 year old male with history of schizophrenia, dementia, hypertension-admitted with nausea and vomiting, found to have bowel obstruction, seen by general surgery-eventually underwent laparoscopic lysis of adhesions for SBO, but postoperatively-had prolonged encephalopathy.  He was evaluated by neurology-provided supportive care-subsequently improved, and is now stable to be discharged back to SNF.  See below for further details.  Brief Hospital Course: Small bowel obstruction: Underwent laparoscopic lysis of adhesions on 6/1-stable from this point of view.  Tolerating diet and having BMs regularly.  Acute hypoxic respiratory failure: Likely secondary to volume overload, treated with Lasix-volume status is stable.  Does not require  diuretics on discharge.  Acute metabolic encephalopathy:Multifactorial-thought to be secondary to hyponatremia, elevated ammonia levels, and some suspicion for seizures.  Evaluated by neurology-all benzodiazepines and antipsychotics were placed on hold, patient was started on Vimpat and Keppra, patient gradually improved.  By day of discharge he was back to his baseline.  We will continue to hold all antipsychotics, benzodiazepines until seen by his outpatient psychiatry.  Sepsis secondary to aspiration pneumonitis: Improved-completed a course of antimicrobial therapy.  Does not require any further antimicrobial therapy on discharge.  Blood cultures negative so far.  Anemia: Suspect secondary to acute illness-although there was some possible hematemesis prior to the hospitalization-no episodes of hematemesis were noted during this hospital stay.  No other overt bleeding was noted.  Patient was transfused 1 unit of PRBC, hemoglobin currently stable.  Continue to follow CBC periodically at SNF.  Schizophrenia: Previously on Depakote, Risperdal and Cogentin all of these medications were on hold-due to prolonged encephalopathy during this hospital stay.  Since he continues to improve off these medications-we will continue to hold these medications until patient is seen by his outpatient psychiatrist.  Possible seizures:As noted above-Thought to be playing a role in his prolonged encephalopathy that occurred during this hospital stay.  Seen by neurology-commendations are to continue with Vimpat and Keppra on discharge.  Dementia/schizophrenia: As noted above-had a prolonged episode of encephalopathy during his hospital stay-he is now significantly improved-able to tell me his name-Per nursing staff-apparently he is back to his usual baseline.  He was seen by speech therapy and started on a dysphagia 1 diet.  Will require close follow-up with speech therapy/PT at SNF.  May benefit from palliative care  follow-up at SNF as well.  Procedures/Studies:  Laparoscopic lysis of adhesions per [name] 09/22/2017  Discharge Diagnoses:  Principal Problem:   Acute metabolic encephalopathy Active Problems:   GERD (gastroesophageal reflux disease)  Schizophrenia ([place])   Malnutrition of moderate degree (HCC)   Acute respiratory failure with hypoxia (HCC)   SBO (small bowel obstruction) s/p lap adhesiolysis 09/22/2017   ARF (acute renal failure) (HCC)   Hematemesis   Sepsis (HCC)   Anxiety state   Small bowel obstruction (HCC)   Hypomagnesemia   Acute lower UTI   Ileus (HCC)   Volume overload   Hypokalemia   Hyperammonemia (HCC)   Agitation   Decreased oral intake   Discharge Instructions:  Activity:  As tolerated with Full fall precautions use walker/cane & assistance as needed   Discharge Instructions    Call MD for:   Complete by:  As directed    FEVER > 101.5 F  (temperatures < 101.5 F are not significant)   Call MD for:  extreme fatigue   Complete by:  As directed    Call MD for:  persistant dizziness or light-headedness   Complete by:  As directed    Call MD for:  persistant nausea and vomiting   Complete by:  As directed    Call MD for:  redness, tenderness, or signs of infection (pain, swelling, redness, odor or green/yellow discharge around incision site)   Complete by:  As directed    Call MD for:  severe uncontrolled pain   Complete by:  As directed    Diet - low sodium heart healthy   Complete by:  As directed    Diet recommendations: Dysphagia 1 (puree);Thin liquid Liquids provided via: Cup;Straw Medication Administration: Crushed with puree Supervision: Full supervision/cueing for compensatory strategies Compensations: Slow rate;Small sips/bites;Minimize environmental distractions Postural Changes and/or Swallow Maneuvers: Seated upright 90 degrees;Upright 30-60 min after meal   Discharge instructions   Complete by:  As directed    See Discharge  Instructions If you are not getting better after two weeks or are noticing you are getting worse, contact our office [phone] for further advice.  We may need to adjust your medications, re-evaluate you in the office, send you to the emergency room, or see what other things we can do to help. The clinic staff is available to answer your questions during regular business hours (8:30am-5pm).  Please don't hesitate to call and ask to speak to one of our nurses for clinical concerns.    A surgeon from [facility] is always on call at the hospitals 24 hours/day If you have a medical emergency, go to the nearest emergency room or call 911.   Discharge wound care:   Complete by:  As directed    It is good for closed incisions and even open wounds to be washed every day.  Shower every day.  Short baths are fine.  Wash the incisions and wounds clean with soap & water.     You may leave closed incisions open to air if it is dry.   You may cover the incision with clean gauze & replace it after your daily shower for comfort.   If you have skin tapes (Steristrips) or skin glue (Dermabond) on your incision, leave them in place.  They will fall off on their own like a scab.  You may trim any edges that curl up with clean scissors.    If you have skin staples, set up an appointment for them to be removed in the office in 10 days after surgery.  If you have a drain, wash around the skin exit site with soap & water and place a new dressing of  gauze or band aid around the skin every day.  Keep the drain site clean & dry.   Driving Restrictions   Complete by:  As directed    You may drive when: - you are no longer taking narcotic prescription pain medication - you can comfortably wear a seatbelt - you can safely make sudden turns/stops without pain.   Increase activity slowly   Complete by:  As directed    Start light daily activities --- self-care, walking, climbing stairs- beginning the day  after surgery.  Gradually increase activities as tolerated.  Control your pain to be active.  Stop when you are tired.  Ideally, walk several times a day, eventually an hour a day.   Most people are back to most day-to-day activities in a few weeks.  It takes 4-6 weeks to get back to unrestricted, intense activity. If you can walk 30 minutes without difficulty, it is safe to try more intense activity such as jogging, treadmill, bicycling, low-impact aerobics, swimming, etc. Save the most intensive and strenuous activity for last (Usually 4-8 weeks after surgery) such as sit-ups, heavy lifting, contact sports, etc.  Refrain from any intense heavy lifting or straining until you are off narcotics for pain control.  You will have off days, but things should improve week-by-week. DO NOT PUSH THROUGH PAIN.  Let pain be your guide: If it hurts to do something, don't do it.   Increase activity slowly   Complete by:  As directed    Lifting restrictions   Complete by:  As directed    If you can walk 30 minutes without difficulty, it is safe to try more intense activity such as jogging, treadmill, bicycling, low-impact aerobics, swimming, etc. Save the most intensive and strenuous activity for last (Usually 4-8 weeks after surgery) such as sit-ups, heavy lifting, contact sports, etc.   Refrain from any intense heavy lifting or straining until you are off narcotics for pain control.  You will have off days, but things should improve week-by-week. DO NOT PUSH THROUGH PAIN.  Let pain be your guide: If it hurts to do something, don't do it.  Pain is your body warning you to avoid that activity for another week until the pain goes down.   May shower / Bathe   Complete by:  As directed    May walk up steps   Complete by:  As directed    Sexual Activity Restrictions   Complete by:  As directed    You may have sexual intercourse when it is comfortable. If it hurts to do something, stop.     Allergies as of  10/10/2017   No Known Allergies     Medication List    STOP taking these medications   benztropine 1 MG tablet Commonly known as:  COGENTIN   buPROPion 75 MG tablet Commonly known as:  WELLBUTRIN   clonazePAM 0.5 MG tablet Commonly known as:  KLONOPIN   divalproex 125 MG capsule Commonly known as:  DEPAKOTE SPRINKLE   LORazepam 1 MG tablet Commonly known as:  ATIVAN   morphine CONCENTRATE 10 MG/0.5ML Soln concentrated solution   ondansetron 4 MG tablet Commonly known as:  ZOFRAN   risperiDONE 3 MG tablet Commonly known as:  RISPERDAL   traZODone 100 MG tablet Commonly known as:  DESYREL     TAKE these medications   acetaminophen 325 MG tablet Commonly known as:  TYLENOL Take 2 tablets (650 mg total) by mouth every 6 (six) hours as  needed for mild pain, moderate pain, fever or headache. What changed:    when to take this  reasons to take this   feeding supplement (ENSURE ENLIVE) Liqd Take 237 mLs by mouth 2 (two) times daily between meals.   ferrous sulfate 325 (65 FE) MG tablet Take 325 mg by mouth daily.   lacosamide 200 MG Tabs tablet Commonly known as:  VIMPAT Take 1 tablet (200 mg total) by mouth 2 (two) times daily.   levETIRAcetam 500 MG tablet Commonly known as:  KEPPRA Take 3 tablets (1,500 mg total) by mouth 2 (two) times daily.   lisinopril 5 MG tablet Commonly known as:  PRINIVIL,ZESTRIL Take 5 mg by mouth daily.   multivitamin tablet Take 1 tablet by mouth daily.   polyethylene glycol packet Commonly known as:  MIRALAX / GLYCOLAX Take 17 g by mouth 2 (two) times daily.   ranitidine 75 MG tablet Commonly known as:  ZANTAC Take 75 mg by mouth daily.   SYSTANE BALANCE 0.6 % Soln Generic drug:  Propylene Glycol Apply 1 drop to eye 2 (two) times daily. For dry eyes   Vitamin D3 50000 units Caps Take 50,000 Units by mouth every 30 (thirty) days. On the 15th            Discharge Care Instructions  (From admission, onward)         Start     Ordered   09/22/17 0000  Discharge wound care:    Comments:  It is good for closed incisions and even open wounds to be washed every day.  Shower every day.  Short baths are fine.  Wash the incisions and wounds clean with soap & water.     You may leave closed incisions open to air if it is dry.   You may cover the incision with clean gauze & replace it after your daily shower for comfort.   If you have skin tapes (Steristrips) or skin glue (Dermabond) on your incision, leave them in place.  They will fall off on their own like a scab.  You may trim any edges that curl up with clean scissors.    If you have skin staples, set up an appointment for them to be removed in the office in 10 days after surgery.  If you have a drain, wash around the skin exit site with soap & water and place a new dressing of gauze or band aid around the skin every day.  Keep the drain site clean & dry.   09/22/17 1213     [facility]-up [facility] Schedule an appointment as soon as possible for a visit in 2 week(s).   Specialty:  General Surgery Why:  for follow up Contact information: [address] [place] [phone]         No Known Allergies    Consultations:  General surgery: Dr. Marcello Moores 09/20/2017  Palliative care: Jobe Gibbon, NP/Dr. Rowe Pavy 09/21/2017  Neurology: Dr. Cheral Marker 09/27/2017   Other Procedures/Studies: Ct Abdomen Pelvis Wo Contrast  Result Date: 09/20/2017 CLINICAL DATA:  Acute onset of generalized abdominal pain, nausea, vomiting and fever. EXAM: CT ABDOMEN AND PELVIS WITHOUT CONTRAST TECHNIQUE: Multidetector CT imaging of the abdomen and pelvis was performed following the standard protocol without IV contrast. COMPARISON:  CT of the abdomen and pelvis performed 10/16/2014 FINDINGS: Lower chest: Trace bilateral pleural effusions are noted. Patchy bibasilar airspace opacities may  reflect pneumonia. Scattered coronary  artery calcifications are seen. Hepatobiliary: The liver is unremarkable in appearance. The patient is status post cholecystectomy, with clips noted at the gallbladder fossa. The common bile duct remains normal in caliber. Pancreas: The pancreas is within normal limits. Spleen: The spleen is unremarkable in appearance. Adrenals/Urinary Tract: The adrenal glands are unremarkable in appearance. Nonspecific perinephric stranding is noted bilaterally. There is no evidence of hydronephrosis. No renal or ureteral stones are identified. Scattered bilateral renal cysts are noted; an isodense cyst on the right is stable from 2016. Stomach/Bowel: The stomach is unremarkable in appearance. There is diffuse distention of small-bowel loops to 6.0 cm in maximal diameter, with a focal transition point is at the mid abdomen along the proximal ileum. There is complete decompression of more distal ileal loops. This is concerning for high-grade small bowel obstruction. Underlying mild mesenteric edema is noted. The appendix is normal in caliber, without evidence of appendicitis. The colon is unremarkable in appearance. Mild wall thickening at the rectum could reflect mild proctitis. Vascular/Lymphatic: Scattered calcification is seen along the abdominal aorta and its branches. The abdominal aorta is otherwise grossly unremarkable. The inferior vena cava is grossly unremarkable. No retroperitoneal lymphadenopathy is seen. No pelvic sidewall lymphadenopathy is identified. Reproductive: The bladder is decompressed and not well characterized. The prostate remains normal in size. Other: No additional soft tissue abnormalities are seen. A right femoral line is noted. Musculoskeletal: No acute osseous abnormalities are identified. Minimal chronic loss of height is noted at the lower thoracic spine. The visualized musculature is unremarkable in appearance. IMPRESSION: 1. Diffuse distention of small-bowel  loops to 6.0 cm in maximal diameter, with a focal transition point at the mid abdomen along the proximal ileum. There is complete decompression of more distal ileal loops. This is concerning for high-grade small bowel obstruction. Underlying mild mesenteric edema noted. 2. Trace bilateral pleural effusions. Patchy bibasilar airspace opacities may reflect pneumonia. 3. Mild wall thickening at the rectum could reflect mild proctitis. 4. Scattered coronary artery calcifications seen. 5. Scattered bilateral renal cysts noted. Aortic Atherosclerosis (ICD10-I70.0). Electronically Signed   By: Garald Balding M.D.   On: 09/20/2017 04:13   Dg Abd 1 View  Result Date: 10/04/2017 CLINICAL DATA:  Increased confusion, schizophrenia, feeding tube, GERD EXAM: ABDOMEN - 1 VIEW COMPARISON:  Portable exam 1252 hours compared to 09/22/2017 FINDINGS: Tip of feeding tube projects just beyond ligament of Treitz at the proximal jejunum. Air-filled mildly prominent loops of small bowel in the upper abdomen. Scattered colonic gas. Small bowel dilatation has significantly decreased since the previous exam. No definite bowel wall thickening. Bones demineralized with degenerative changes of the lumbar spine. IMPRESSION: Decreased small bowel dilatation since previous study. Electronically Signed   By: Lavonia Dana M.D.   On: 10/04/2017 13:08   Dg Abd 1 View  Result Date: 09/21/2017 CLINICAL DATA:  Follow-up small bowel obstruction EXAM: ABDOMEN - 1 VIEW COMPARISON:  Yesterday FINDINGS: Nasogastric tube tip overlaps the distal stomach. Unchanged dilated small bowel. Stool seen throughout much of the colon. No visible pneumatosis. No concerning gas collection. Streaky lower lobe opacities, stable from admission CT. IMPRESSION: 1. Unchanged small bowel obstruction. 2. Formed stool seen throughout the colon. 3. Nasogastric tube in good position. Electronically Signed   By: Monte Fantasia M.D.   On: 09/21/2017 09:01   Dg Abdomen 1  View  Result Date: 09/20/2017 CLINICAL DATA:  67 year old male with high-grade small bowel obstruction. NG tube placement. EXAM: ABDOMEN - 1 VIEW COMPARISON:  KUB 0407 hours  today, CT Abdomen and Pelvis 0332 hours today. FINDINGS: Portable AP view at 0649 hours. Enteric tube is present in the epigastrium with side hole at the level of the proximal gastric body. Dilated gas-filled small bowel loops in the abdomen up to 6 centimeters diameter persist. No definite pneumoperitoneum on this portable view. IMPRESSION: 1. Enteric tube placed into the stomach with side hole at the gastric body. 2. Stable small bowel obstruction. Electronically Signed   By: Genevie Ann M.D.   On: 09/20/2017 07:35   Dg Abdomen 1 View  Result Date: 09/20/2017 CLINICAL DATA:  67 y/o  M; NG tube positioning. EXAM: ABDOMEN - 1 VIEW COMPARISON:  09/20/2017 CT abdomen and pelvis. FINDINGS: Diffuse small bowel dilatation. Enteric tube tip projects over the distal stomach. Moderate degenerative changes of the lumbar spine with mild levocurvature. IMPRESSION: Enteric tube tip projects over distal stomach. Diffuse small bowel dilatation. Electronically Signed   By: Kristine Garbe M.D.   On: 09/20/2017 04:26   Ct Head Wo Contrast  Result Date: 09/26/2017 CLINICAL DATA:  Unexplained altered level of consciousness beginning this morning. EXAM: CT HEAD WITHOUT CONTRAST TECHNIQUE: Contiguous axial images were obtained from the base of the skull through the vertex without intravenous contrast. COMPARISON:  12/12/2014 FINDINGS: Brain: Motion artifact noted. No evidence of acute infarction, hemorrhage, hydrocephalus, extra-axial collection, or mass lesion/mass effect. Mild diffuse cerebral atrophy is noted. Vascular:  No hyperdense vessel or other acute findings. Skull: No evidence of fracture or other significant bone abnormality. Sinuses/Orbits: Air-fluid level is seen within the left maxillary sinus, with diffuse thickening and sclerosis of  the bony walls. This finding shows no significant change and is consistent with chronic sinusitis. Mucosal thickening is again noted within the sphenoid sinus. Other: None. IMPRESSION: No acute intracranial abnormality. Mild cerebral atrophy. Chronic sinusitis. Electronically Signed   By: Earle Gell M.D.   On: 09/26/2017 10:34   Mr Brain Wo Contrast  Result Date: 10/02/2017 CLINICAL DATA:  Initial evaluation for acute altered mental status, seizure. EXAM: MRI HEAD WITHOUT CONTRAST TECHNIQUE: Multiplanar, multiecho pulse sequences of the brain and surrounding structures were obtained without intravenous contrast. COMPARISON:  Prior MRI from 09/27/2017 FINDINGS: Brain: Generalized age-related cerebral atrophy. Few scattered patchy T2/FLAIR hyperintensities within the periventricular and deep white matter both cerebral hemispheres, nonspecific, but felt to be within normal limits for age. No abnormal foci of restricted diffusion to suggest acute or subacute ischemia. Gray-white matter differentiation maintained. No changes related to acute seizure identified. Probable small remote left cerebellar infarct noted (series 10, image 6). No other evidence for chronic ischemia. No foci of susceptibility artifact to suggest acute or chronic intracranial hemorrhage. No mass lesion, midline shift or mass effect. No hydrocephalus. No extra-axial fluid collection. Incidental note made of a partially empty sella. Vascular: Artery, which may be occluded (series 10, image 3). Right vertebral artery diminutive but grossly patent as is the basilar artery. Anterior circulation vascular flow voids well maintained. Within the left vertebral Skull and upper cervical spine: Craniocervical junction within normal limits. Bone marrow signal intensity normal. No scalp soft tissue abnormality. Sinuses/Orbits: Globes and orbital soft tissues within normal limits. Chronic left maxillary sinusitis noted. Superimposed air-fluid levels suggest  acute sinus disease. Bilateral mastoid effusions noted. Other: None. IMPRESSION: 1. No acute intracranial abnormality. 2. Small remote left cerebellar infarct. 3. 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 Overall vertebrobasilar system is diminutive, with suspected predominant fetal type origin of the PCAs. 4. Acute on  chronic left maxillary sinusitis. Electronically Signed   By: Jeannine Boga M.D.   On: 10/02/2017 03:56   Mr Brain Wo Contrast  Result Date: 09/27/2017 CLINICAL DATA:  Altered level of consciousness. History of schizophrenia, hyperlipidemia. EXAM: MRI HEAD WITHOUT CONTRAST TECHNIQUE: Multiplanar, multiecho pulse sequences of the brain and surrounding structures were obtained without intravenous contrast. COMPARISON:  CT head September 26, 2017 FINDINGS: Nonstandard coil used as patient's head was not accommodated by standard coil. Overall poor signal to noise ratio. Moderately motion degraded examination. INTRACRANIAL CONTENTS: No reduced diffusion to suggest acute ischemia. No susceptibility artifact to suggest hemorrhage. The ventricles and sulci are normal for patient's age. Mild probable chronic small vessel ischemic changes. No suspicious parenchymal signal, or mass effect. No abnormal extra-axial fluid collections. No extra-axial masses. VASCULAR: Normal major intracranial vascular flow voids present at skull base. SKULL AND UPPER CERVICAL SPINE: No abnormal sellar expansion. No suspicious calvarial bone marrow signal. Craniocervical junction maintained. SINUSES/ORBITS: Small left mastoid effusion. Left maxillary sinusitis, partially characterized.The included ocular globes and orbital contents are non-suspicious. OTHER: None. IMPRESSION: 1. Technically limited examination without acute intracranial process. Electronically Signed   By: Elon Alas M.D.   On: 09/27/2017 19:06   Dg Chest Port 1 View  Result Date: 10/08/2017 CLINICAL DATA:  Sudden onset of  shortness of breath and respiratory distress today. Clinical concerns discern over possible volume overload. EXAM: PORTABLE CHEST 1 VIEW COMPARISON:  Portable chest x-ray of Sep 03, 2017 FINDINGS: The lungs are adequately inflated. The interstitial markings are increased. The pulmonary vascularity is engorged and indistinct. The cardiac silhouette is enlarged. There is no pleural effusion. There is calcification in the wall of the thoracic aortic arch. Old right lateral rib deformities are present and stable. IMPRESSION: Findings worrisome for pulmonary interstitial and early alveolar edema. The appearance of the chest has deteriorated slightly since the previous study. Thoracic aortic atherosclerosis. Electronically Signed   By: David  Martinique M.D.   On: 10/08/2017 15:22   Dg Chest Port 1 View  Result Date: 10/04/2017 CLINICAL DATA:  Increased confusion, feeding tube, GERD, schizophrenia EXAM: PORTABLE CHEST 1 VIEW COMPARISON:  Portable exam 1252 hours compared to 10/02/2017 FINDINGS: Feeding tube extends into stomach. Normal heart size, mediastinal contours, and pulmonary vascularity. BILATERAL pulmonary infiltrates in the mid to lower lungs unchanged. Apices clear. No pleural effusion or pneumothorax. Bones demineralized. Old RIGHT rib fractures. IMPRESSION: Persistent BILATERAL pulmonary infiltrates in the mid to lower lungs. Electronically Signed   By: Lavonia Dana M.D.   On: 10/04/2017 13:06   Dg Chest Port 1 View  Result Date: 10/02/2017 CLINICAL DATA:  Aspiration into airway EXAM: PORTABLE CHEST 1 VIEW COMPARISON:  Portable exam 1735 hours compared to 09/25/2017 FINDINGS: Feeding tube traverses esophagus into stomach. Rotated exam. Normal heart size mediastinal contours. BILATERAL pulmonary infiltrates RIGHT greater than LEFT. No pleural effusion or pneumothorax. Bones demineralized. IMPRESSION: Persistent BILATERAL pulmonary infiltrates. Electronically Signed   By: Lavonia Dana M.D.   On: 10/02/2017  18:01   Dg Chest Port 1 View  Result Date: 09/25/2017 CLINICAL DATA:  67 year old male with shortness breath. Subsequent encounter. EXAM: PORTABLE CHEST 1 VIEW COMPARISON:  09/24/2017 and 12/11/2016 chest x-ray. FINDINGS: Persistent slightly asymmetric airspace disease may represent pulmonary edema superimposed upon chronic lung changes. Infectious infiltrate secondary less likely consideration. No pneumothorax noted. Heart size within normal limits. Calcified tortuous aorta. Nasogastric tube tip gastric body/antrum junction. IMPRESSION: Similar appearance of slightly asymmetric airspace disease which may represent pulmonary edema  superimposed upon chronic lung changes. Infectious infiltrate less likely consideration. Aortic Atherosclerosis (ICD10-I70.0). Electronically Signed   By: Genia Del M.D.   On: 09/25/2017 06:47   Dg Chest Port 1 View  Result Date: 09/24/2017 CLINICAL DATA:  Shortness of breath. EXAM: PORTABLE CHEST 1 VIEW COMPARISON:  Chest radiograph Sep 19, 2017 FINDINGS: Increasing interstitial and to lesser extent alveolar airspace opacities. Cardiac silhouette is upper limits of normal. Patient rotated LEFT. Mediastinal silhouette is nonsuspicious. Old bilateral rib fractures. Nasogastric tube tip projecting in distal stomach. IMPRESSION: Increasing interstitial and alveolar airspace opacities seen with pulmonary edema or pneumonia. Nasogastric tube tip projecting in distal stomach. Electronically Signed   By: Elon Alas M.D.   On: 09/24/2017 05:18   Dg Chest Port 1 View  Result Date: 09/20/2017 CLINICAL DATA:  Altered mental status EXAM: PORTABLE CHEST 1 VIEW COMPARISON:  December 20, 2014 FINDINGS: Chronic right rib fractures. Cardiomediastinal silhouette is normal. The left lung is clear. Possible mild opacity in the right upper lung. No other acute abnormalities. IMPRESSION: Possible opacity in the right upper lung versus confluence of shadows. Recommend a PA and lateral chest  x-ray for better evaluation. Electronically Signed   By: Dorise Bullion III M.D   On: 09/20/2017 00:19   Dg Abd Portable 1v-small Bowel Obstruction Protocol-initial, 8 Hr Delay  Result Date: 09/20/2017 CLINICAL DATA:  8 hour delay has part of a small-bowel obstruction protocol. EXAM: PORTABLE ABDOMEN - 1 VIEW COMPARISON:  09/20/2017 at 6:49 a.m. FINDINGS: The contrast injected through the nasogastric tube lies within the gastric fundus. There is no significant contrast within the small bowel. There is diffuse and significant small bowel dilation consistent without high-grade obstruction. IMPRESSION: 1. No evidence of contrast extending distal to the stomach. 2. Persistent findings of a high-grade small bowel obstruction. Electronically Signed   By: Lajean Manes M.D.   On: 09/20/2017 20:11   Dg Abd Portable 1 View  Result Date: 09/20/2017 CLINICAL DATA:  Nasogastric tube placement. EXAM: PORTABLE ABDOMEN - 1 VIEW COMPARISON:  CT of the abdomen and pelvis from 10/16/2014 FINDINGS: There is diffuse distention of small-bowel loops to 5.5 cm in diameter, raising concern for high-grade small bowel obstruction. Residual stool is noted in the colon. The stomach is partially filled with air. The patient's enteric tube is noted ending overlying the body of the stomach, with the side port about the gastroesophageal junction. No acute osseous abnormalities are seen. No free intra-abdominal air is seen, though evaluation for free air is limited on a single supine view. A right femoral line is noted. IMPRESSION: 1. Diffuse distention of small-bowel loops to 5.5 cm in diameter, raising concern for high-grade small bowel obstruction. No free intra-abdominal air seen. 2. Enteric tube noted ending overlying the body of the stomach, with the side port about the gastroesophageal junction. These results were called by telephone at the time of interpretation on 09/20/2017 at 1:12 am to Dr. Joseph Berkshire, who verbally  acknowledged these results. Electronically Signed   By: Garald Balding M.D.   On: 09/20/2017 01:13   Dg Abd Portable 2v  Result Date: 09/22/2017 CLINICAL DATA:  Followup small bowel obstruction. EXAM: PORTABLE ABDOMEN - 2 VIEW COMPARISON:  09/21/2017 and older exams. FINDINGS: There are persistent dilated loops of small bowel, similar to prior exams, consistent with a high-grade partial obstruction. Air in stool is seen within a nondilated colon. IMPRESSION: 1. Persistent high-grade small bowel obstruction. No significant change in small bowel distension when compared to  the prior studies. Electronically Signed   By: Lajean Manes M.D.   On: 09/22/2017 07:33   Dg Swallowing Func-speech Pathology  Result Date: 10/06/2017 Objective Swallowing Evaluation: Type of Study: MBS-Modified Barium Swallow Study  Patient Details Name: Kevin Humphrey MRN: [id number] Date of Birth: 11/14/1950 Today's Date: 10/06/2017 Time: SLP Start Time (ACUTE ONLY): 1115 -SLP Stop Time (ACUTE ONLY): 1135 SLP Time Calculation (min) (ACUTE ONLY): 20 min Past Medical History: Past Medical History: Diagnosis Date . Allergic rhinitis 08/02/2012 . Depression 08/02/2012 . Dysphagia, unspecified 08/02/2012 . GERD (gastroesophageal reflux disease) 08/02/2012 . Other and unspecified hyperlipidemia 08/02/2012 . Schizophrenia ([place]) 08/02/2012 . Vertebral compression fracture ([place]) 08/02/2012 Past Surgical History: Past Surgical History: Procedure Laterality Date . EXPLORATORY LAPAROTOMY    ??? (Has large midline incision) . LAPAROSCOPY N/A 09/22/2017  Procedure: LAPAROSCOPY DIAGNOSTIC  LYSIS OF ADHESIONS;  Surgeon: Michael Boston, MD;  Location: [facility];  Service: General;  Laterality: N/A; HPI: Patient is a 67 y.o. male with PMH: advanced dementia, schizophrenia, HTN, GERD, depression, dysphagia, who was brought to hospital from SNF secondary to persistent nausea with hematemesis. Chest x-ray at SNF showed some infiltrates and abdomen x-ray concering for bowel  obstruction. Patient diagnosed at hospital with sepsis likely secondary to aspiration PNA, anemia, HTN, high-grade small bowel obstruction. on 6/1, he had laproscopy diagnositic lysis of adhesions.  Subjective: "I'm hungry. I wanna eat." Assessment / Plan / Recommendation CHL IP CLINICAL IMPRESSIONS 10/06/2017 Clinical Impression Patient presents with what appears to be adequate strength and timing during this evaluation, which was limited due to pt agitation. Pt took several boluses of puree and a single teaspoon of thin liquid; no penetration or aspiration observed. There was no oral holding, timely anterior to posterior transit. Swallow initiation was timely with complete epiglottic deflection and airway closure. No residue remained in the pharynx after the swallow. An esophageal sweep was performed and was unremarkable. Consistent with bedside assessment, pt was tachypneic, though this appeared to be due to anxiety and was not associated with airway compromise. Pt does remain at risk for aspiration primarily due to cognitive deficits and mentation. Recommend dys 1, thin liquids with full supervision; ensure pt is fully alert and assist with self-feeding as able. Will follow for tolerance, possible progression of solids. SLP Visit Diagnosis Dysphagia, oropharyngeal phase (R13.12) Attention and concentration deficit following -- Frontal lobe and executive function deficit following -- Impact on safety and function Mild aspiration risk;Moderate aspiration risk   CHL IP TREATMENT RECOMMENDATION 10/06/2017 Treatment Recommendations Therapy as outlined in treatment plan below   Prognosis 10/06/2017 Prognosis for Safe Diet Advancement Fair Barriers to Reach Goals Cognitive deficits;Behavior Barriers/Prognosis Comment -- CHL IP DIET RECOMMENDATION 10/06/2017 SLP Diet Recommendations Dysphagia 1 (Puree) solids;Thin liquid Liquid Administration via Spoon;Cup Medication Administration Crushed with puree Compensations Slow  rate;Small sips/bites;Minimize environmental distractions Postural Changes Remain semi-upright after after feeds/meals (Comment);Seated upright at 90 degrees   CHL IP OTHER RECOMMENDATIONS 10/06/2017 Recommended Consults -- Oral Care Recommendations Oral care BID Other Recommendations --   CHL IP FOLLOW UP RECOMMENDATIONS 10/06/2017 Follow up Recommendations Other (comment)   CHL IP FREQUENCY AND DURATION 10/06/2017 Speech Therapy Frequency (ACUTE ONLY) min 2x/week Treatment Duration 2 weeks      CHL IP ORAL PHASE 10/06/2017 Oral Phase WFL Oral - Pudding Teaspoon -- Oral - Pudding Cup -- Oral - Honey Teaspoon -- Oral - Honey Cup -- Oral - Nectar Teaspoon -- Oral - Nectar Cup -- Oral - Nectar Straw -- Oral - Thin Teaspoon --  Oral - Thin Cup -- Oral - Thin Straw -- Oral - Puree -- Oral - Mech Soft -- Oral - Regular -- Oral - Multi-Consistency -- Oral - Pill -- Oral Phase - Comment --  CHL IP PHARYNGEAL PHASE 10/06/2017 Pharyngeal Phase WFL Pharyngeal- Pudding Teaspoon -- Pharyngeal -- Pharyngeal- Pudding Cup -- Pharyngeal -- Pharyngeal- Honey Teaspoon -- Pharyngeal -- Pharyngeal- Honey Cup -- Pharyngeal -- Pharyngeal- Nectar Teaspoon -- Pharyngeal -- Pharyngeal- Nectar Cup -- Pharyngeal -- Pharyngeal- Nectar Straw -- Pharyngeal -- Pharyngeal- Thin Teaspoon -- Pharyngeal -- Pharyngeal- Thin Cup -- Pharyngeal -- Pharyngeal- Thin Straw -- Pharyngeal -- Pharyngeal- Puree -- Pharyngeal -- Pharyngeal- Mechanical Soft -- Pharyngeal -- Pharyngeal- Regular -- Pharyngeal -- Pharyngeal- Multi-consistency -- Pharyngeal -- Pharyngeal- Pill -- Pharyngeal -- Pharyngeal Comment --  CHL IP CERVICAL ESOPHAGEAL PHASE 10/06/2017 Cervical Esophageal Phase WFL Pudding Teaspoon -- Pudding Cup -- Honey Teaspoon -- Honey Cup -- Nectar Teaspoon -- Nectar Cup -- Nectar Straw -- Thin Teaspoon -- Thin Cup -- Thin Straw -- Puree -- Mechanical Soft -- Regular -- Multi-consistency -- Pill -- Cervical Esophageal Comment -- Deneise Lever, MS, CCC-SLP  Speech-Language Pathologist [phone] No flowsheet data found. Aliene Altes 10/06/2017, 1:02 PM                 TODAY-DAY OF DISCHARGE:  Subjective:   Kevin Humphrey today is alert-awake-able to follow some commands.  Per nursing staff he is able to consume supplements and liquids.  Objective:   Blood pressure 139/72, pulse 76, temperature (!) 97.5 F (36.4 C), temperature source Oral, resp. rate 18, height 5\' 11"  (1.803 m), weight 72.6 kg (160 lb 0.9 oz), SpO2 97 %.  Intake/Output Summary (Last 24 hours) at 10/10/2017 1206 Last data filed at 10/10/2017 1027 Gross per 24 hour  Intake 1375 ml  Output 1600 ml  Net -225 ml   Filed Weights   10/09/17 0328 10/09/17 0444 10/10/17 0339  Weight: 75.8 kg (167 lb 1.7 oz) 75.8 kg (167 lb 1.7 oz) 72.6 kg (160 lb 0.9 oz)    Exam: Awake Alert,  [place].AT,PERRAL Supple Neck,No JVD, No cervical lymphadenopathy appriciated.  Symmetrical Chest wall movement, Good air movement bilaterally, CTAB RRR,No Gallops,Rubs or new Murmurs, No Parasternal Heave +ve B.Sounds, Abd Soft, Non tender, No organomegaly appriciated, No rebound -guarding or rigidity. No Cyanosis, Clubbing or edema, No new Rash or bruise   PERTINENT RADIOLOGIC STUDIES: Ct Abdomen Pelvis Wo Contrast  Result Date: 09/20/2017 CLINICAL DATA:  Acute onset of generalized abdominal pain, nausea, vomiting and fever. EXAM: CT ABDOMEN AND PELVIS WITHOUT CONTRAST TECHNIQUE: Multidetector CT imaging of the abdomen and pelvis was performed following the standard protocol without IV contrast. COMPARISON:  CT of the abdomen and pelvis performed 10/16/2014 FINDINGS: Lower chest: Trace bilateral pleural effusions are noted. Patchy bibasilar airspace opacities may reflect pneumonia. Scattered coronary artery calcifications are seen. Hepatobiliary: The liver is unremarkable in appearance. The patient is status post cholecystectomy, with clips noted at the gallbladder fossa. The common bile duct remains  normal in caliber. Pancreas: The pancreas is within normal limits. Spleen: The spleen is unremarkable in appearance. Adrenals/Urinary Tract: The adrenal glands are unremarkable in appearance. Nonspecific perinephric stranding is noted bilaterally. There is no evidence of hydronephrosis. No renal or ureteral stones are identified. Scattered bilateral renal cysts are noted; an isodense cyst on the right is stable from 2016. Stomach/Bowel: The stomach is unremarkable in appearance. There is diffuse distention of small-bowel loops to 6.0 cm in maximal diameter,  with a focal transition point is at the mid abdomen along the proximal ileum. There is complete decompression of more distal ileal loops. This is concerning for high-grade small bowel obstruction. Underlying mild mesenteric edema is noted. The appendix is normal in caliber, without evidence of appendicitis. The colon is unremarkable in appearance. Mild wall thickening at the rectum could reflect mild proctitis. Vascular/Lymphatic: Scattered calcification is seen along the abdominal aorta and its branches. The abdominal aorta is otherwise grossly unremarkable. The inferior vena cava is grossly unremarkable. No retroperitoneal lymphadenopathy is seen. No pelvic sidewall lymphadenopathy is identified. Reproductive: The bladder is decompressed and not well characterized. The prostate remains normal in size. Other: No additional soft tissue abnormalities are seen. A right femoral line is noted. Musculoskeletal: No acute osseous abnormalities are identified. Minimal chronic loss of height is noted at the lower thoracic spine. The visualized musculature is unremarkable in appearance. IMPRESSION: 1. Diffuse distention of small-bowel loops to 6.0 cm in maximal diameter, with a focal transition point at the mid abdomen along the proximal ileum. There is complete decompression of more distal ileal loops. This is concerning for high-grade small bowel obstruction. Underlying  mild mesenteric edema noted. 2. Trace bilateral pleural effusions. Patchy bibasilar airspace opacities may reflect pneumonia. 3. Mild wall thickening at the rectum could reflect mild proctitis. 4. Scattered coronary artery calcifications seen. 5. Scattered bilateral renal cysts noted. Aortic Atherosclerosis (ICD10-I70.0). Electronically Signed   By: Garald Balding M.D.   On: 09/20/2017 04:13   Dg Abd 1 View  Result Date: 10/04/2017 CLINICAL DATA:  Increased confusion, schizophrenia, feeding tube, GERD EXAM: ABDOMEN - 1 VIEW COMPARISON:  Portable exam 1252 hours compared to 09/22/2017 FINDINGS: Tip of feeding tube projects just beyond ligament of Treitz at the proximal jejunum. Air-filled mildly prominent loops of small bowel in the upper abdomen. Scattered colonic gas. Small bowel dilatation has significantly decreased since the previous exam. No definite bowel wall thickening. Bones demineralized with degenerative changes of the lumbar spine. IMPRESSION: Decreased small bowel dilatation since previous study. Electronically Signed   By: Lavonia Dana M.D.   On: 10/04/2017 13:08   Dg Abd 1 View  Result Date: 09/21/2017 CLINICAL DATA:  Follow-up small bowel obstruction EXAM: ABDOMEN - 1 VIEW COMPARISON:  Yesterday FINDINGS: Nasogastric tube tip overlaps the distal stomach. Unchanged dilated small bowel. Stool seen throughout much of the colon. No visible pneumatosis. No concerning gas collection. Streaky lower lobe opacities, stable from admission CT. IMPRESSION: 1. Unchanged small bowel obstruction. 2. Formed stool seen throughout the colon. 3. Nasogastric tube in good position. Electronically Signed   By: Monte Fantasia M.D.   On: 09/21/2017 09:01   Dg Abdomen 1 View  Result Date: 09/20/2017 CLINICAL DATA:  67 year old male with high-grade small bowel obstruction. NG tube placement. EXAM: ABDOMEN - 1 VIEW COMPARISON:  KUB 0407 hours today, CT Abdomen and Pelvis 0332 hours today. FINDINGS: Portable AP view  at 0649 hours. Enteric tube is present in the epigastrium with side hole at the level of the proximal gastric body. Dilated gas-filled small bowel loops in the abdomen up to 6 centimeters diameter persist. No definite pneumoperitoneum on this portable view. IMPRESSION: 1. Enteric tube placed into the stomach with side hole at the gastric body. 2. Stable small bowel obstruction. Electronically Signed   By: Genevie Ann M.D.   On: 09/20/2017 07:35   Dg Abdomen 1 View  Result Date: 09/20/2017 CLINICAL DATA:  67 y/o  M; NG tube positioning. EXAM: ABDOMEN -  1 VIEW COMPARISON:  09/20/2017 CT abdomen and pelvis. FINDINGS: Diffuse small bowel dilatation. Enteric tube tip projects over the distal stomach. Moderate degenerative changes of the lumbar spine with mild levocurvature. IMPRESSION: Enteric tube tip projects over distal stomach. Diffuse small bowel dilatation. Electronically Signed   By: Kristine Garbe M.D.   On: 09/20/2017 04:26   Ct Head Wo Contrast  Result Date: 09/26/2017 CLINICAL DATA:  Unexplained altered level of consciousness beginning this morning. EXAM: CT HEAD WITHOUT CONTRAST TECHNIQUE: Contiguous axial images were obtained from the base of the skull through the vertex without intravenous contrast. COMPARISON:  12/12/2014 FINDINGS: Brain: Motion artifact noted. No evidence of acute infarction, hemorrhage, hydrocephalus, extra-axial collection, or mass lesion/mass effect. Mild diffuse cerebral atrophy is noted. Vascular:  No hyperdense vessel or other acute findings. Skull: No evidence of fracture or other significant bone abnormality. Sinuses/Orbits: Air-fluid level is seen within the left maxillary sinus, with diffuse thickening and sclerosis of the bony walls. This finding shows no significant change and is consistent with chronic sinusitis. Mucosal thickening is again noted within the sphenoid sinus. Other: None. IMPRESSION: No acute intracranial abnormality. Mild cerebral atrophy. Chronic  sinusitis. Electronically Signed   By: Earle Gell M.D.   On: 09/26/2017 10:34   Mr Brain Wo Contrast  Result Date: 10/02/2017 CLINICAL DATA:  Initial evaluation for acute altered mental status, seizure. EXAM: MRI HEAD WITHOUT CONTRAST TECHNIQUE: Multiplanar, multiecho pulse sequences of the brain and surrounding structures were obtained without intravenous contrast. COMPARISON:  Prior MRI from 09/27/2017 FINDINGS: Brain: Generalized age-related cerebral atrophy. Few scattered patchy T2/FLAIR hyperintensities within the periventricular and deep white matter both cerebral hemispheres, nonspecific, but felt to be within normal limits for age. No abnormal foci of restricted diffusion to suggest acute or subacute ischemia. Gray-white matter differentiation maintained. No changes related to acute seizure identified. Probable small remote left cerebellar infarct noted (series 10, image 6). No other evidence for chronic ischemia. No foci of susceptibility artifact to suggest acute or chronic intracranial hemorrhage. No mass lesion, midline shift or mass effect. No hydrocephalus. No extra-axial fluid collection. Incidental note made of a partially empty sella. Vascular: Artery, which may be occluded (series 10, image 3). Right vertebral artery diminutive but grossly patent as is the basilar artery. Anterior circulation vascular flow voids well maintained. Within the left vertebral Skull and upper cervical spine: Craniocervical junction within normal limits. Bone marrow signal intensity normal. No scalp soft tissue abnormality. Sinuses/Orbits: Globes and orbital soft tissues within normal limits. Chronic left maxillary sinusitis noted. Superimposed air-fluid levels suggest acute sinus disease. Bilateral mastoid effusions noted. Other: None. IMPRESSION: 1. No acute intracranial abnormality. 2. Small remote left cerebellar infarct. 3. 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 Overall  vertebrobasilar system is diminutive, with suspected predominant fetal type origin of the PCAs. 4. Acute on chronic left maxillary sinusitis. Electronically Signed   By: Jeannine Boga M.D.   On: 10/02/2017 03:56   Mr Brain Wo Contrast  Result Date: 09/27/2017 CLINICAL DATA:  Altered level of consciousness. History of schizophrenia, hyperlipidemia. EXAM: MRI HEAD WITHOUT CONTRAST TECHNIQUE: Multiplanar, multiecho pulse sequences of the brain and surrounding structures were obtained without intravenous contrast. COMPARISON:  CT head September 26, 2017 FINDINGS: Nonstandard coil used as patient's head was not accommodated by standard coil. Overall poor signal to noise ratio. Moderately motion degraded examination. INTRACRANIAL CONTENTS: No reduced diffusion to suggest acute ischemia. No susceptibility artifact to suggest hemorrhage. The ventricles and sulci are normal  for patient's age. Mild probable chronic small vessel ischemic changes. No suspicious parenchymal signal, or mass effect. No abnormal extra-axial fluid collections. No extra-axial masses. VASCULAR: Normal major intracranial vascular flow voids present at skull base. SKULL AND UPPER CERVICAL SPINE: No abnormal sellar expansion. No suspicious calvarial bone marrow signal. Craniocervical junction maintained. SINUSES/ORBITS: Small left mastoid effusion. Left maxillary sinusitis, partially characterized.The included ocular globes and orbital contents are non-suspicious. OTHER: None. IMPRESSION: 1. Technically limited examination without acute intracranial process. Electronically Signed   By: Elon Alas M.D.   On: 09/27/2017 19:06   Dg Chest Port 1 View  Result Date: 10/08/2017 CLINICAL DATA:  Sudden onset of shortness of breath and respiratory distress today. Clinical concerns discern over possible volume overload. EXAM: PORTABLE CHEST 1 VIEW COMPARISON:  Portable chest x-ray of Sep 03, 2017 FINDINGS: The lungs are adequately inflated. The  interstitial markings are increased. The pulmonary vascularity is engorged and indistinct. The cardiac silhouette is enlarged. There is no pleural effusion. There is calcification in the wall of the thoracic aortic arch. Old right lateral rib deformities are present and stable. IMPRESSION: Findings worrisome for pulmonary interstitial and early alveolar edema. The appearance of the chest has deteriorated slightly since the previous study. Thoracic aortic atherosclerosis. Electronically Signed   By: David  Martinique M.D.   On: 10/08/2017 15:22   Dg Chest Port 1 View  Result Date: 10/04/2017 CLINICAL DATA:  Increased confusion, feeding tube, GERD, schizophrenia EXAM: PORTABLE CHEST 1 VIEW COMPARISON:  Portable exam 1252 hours compared to 10/02/2017 FINDINGS: Feeding tube extends into stomach. Normal heart size, mediastinal contours, and pulmonary vascularity. BILATERAL pulmonary infiltrates in the mid to lower lungs unchanged. Apices clear. No pleural effusion or pneumothorax. Bones demineralized. Old RIGHT rib fractures. IMPRESSION: Persistent BILATERAL pulmonary infiltrates in the mid to lower lungs. Electronically Signed   By: Lavonia Dana M.D.   On: 10/04/2017 13:06   Dg Chest Port 1 View  Result Date: 10/02/2017 CLINICAL DATA:  Aspiration into airway EXAM: PORTABLE CHEST 1 VIEW COMPARISON:  Portable exam 1735 hours compared to 09/25/2017 FINDINGS: Feeding tube traverses esophagus into stomach. Rotated exam. Normal heart size mediastinal contours. BILATERAL pulmonary infiltrates RIGHT greater than LEFT. No pleural effusion or pneumothorax. Bones demineralized. IMPRESSION: Persistent BILATERAL pulmonary infiltrates. Electronically Signed   By: Lavonia Dana M.D.   On: 10/02/2017 18:01   Dg Chest Port 1 View  Result Date: 09/25/2017 CLINICAL DATA:  67 year old male with shortness breath. Subsequent encounter. EXAM: PORTABLE CHEST 1 VIEW COMPARISON:  09/24/2017 and 12/11/2016 chest x-ray. FINDINGS: Persistent  slightly asymmetric airspace disease may represent pulmonary edema superimposed upon chronic lung changes. Infectious infiltrate secondary less likely consideration. No pneumothorax noted. Heart size within normal limits. Calcified tortuous aorta. Nasogastric tube tip gastric body/antrum junction. IMPRESSION: Similar appearance of slightly asymmetric airspace disease which may represent pulmonary edema superimposed upon chronic lung changes. Infectious infiltrate less likely consideration. Aortic Atherosclerosis (ICD10-I70.0). Electronically Signed   By: Genia Del M.D.   On: 09/25/2017 06:47   Dg Chest Port 1 View  Result Date: 09/24/2017 CLINICAL DATA:  Shortness of breath. EXAM: PORTABLE CHEST 1 VIEW COMPARISON:  Chest radiograph Sep 19, 2017 FINDINGS: Increasing interstitial and to lesser extent alveolar airspace opacities. Cardiac silhouette is upper limits of normal. Patient rotated LEFT. Mediastinal silhouette is nonsuspicious. Old bilateral rib fractures. Nasogastric tube tip projecting in distal stomach. IMPRESSION: Increasing interstitial and alveolar airspace opacities seen with pulmonary edema or pneumonia. Nasogastric tube tip projecting in distal stomach.  Electronically Signed   By: Elon Alas M.D.   On: 09/24/2017 05:18   Dg Chest Port 1 View  Result Date: 09/20/2017 CLINICAL DATA:  Altered mental status EXAM: PORTABLE CHEST 1 VIEW COMPARISON:  December 20, 2014 FINDINGS: Chronic right rib fractures. Cardiomediastinal silhouette is normal. The left lung is clear. Possible mild opacity in the right upper lung. No other acute abnormalities. IMPRESSION: Possible opacity in the right upper lung versus confluence of shadows. Recommend a PA and lateral chest x-ray for better evaluation. Electronically Signed   By: Dorise Bullion III M.D   On: 09/20/2017 00:19   Dg Abd Portable 1v-small Bowel Obstruction Protocol-initial, 8 Hr Delay  Result Date: 09/20/2017 CLINICAL DATA:  8 hour delay  has part of a small-bowel obstruction protocol. EXAM: PORTABLE ABDOMEN - 1 VIEW COMPARISON:  09/20/2017 at 6:49 a.m. FINDINGS: The contrast injected through the nasogastric tube lies within the gastric fundus. There is no significant contrast within the small bowel. There is diffuse and significant small bowel dilation consistent without high-grade obstruction. IMPRESSION: 1. No evidence of contrast extending distal to the stomach. 2. Persistent findings of a high-grade small bowel obstruction. Electronically Signed   By: Lajean Manes M.D.   On: 09/20/2017 20:11   Dg Abd Portable 1 View  Result Date: 09/20/2017 CLINICAL DATA:  Nasogastric tube placement. EXAM: PORTABLE ABDOMEN - 1 VIEW COMPARISON:  CT of the abdomen and pelvis from 10/16/2014 FINDINGS: There is diffuse distention of small-bowel loops to 5.5 cm in diameter, raising concern for high-grade small bowel obstruction. Residual stool is noted in the colon. The stomach is partially filled with air. The patient's enteric tube is noted ending overlying the body of the stomach, with the side port about the gastroesophageal junction. No acute osseous abnormalities are seen. No free intra-abdominal air is seen, though evaluation for free air is limited on a single supine view. A right femoral line is noted. IMPRESSION: 1. Diffuse distention of small-bowel loops to 5.5 cm in diameter, raising concern for high-grade small bowel obstruction. No free intra-abdominal air seen. 2. Enteric tube noted ending overlying the body of the stomach, with the side port about the gastroesophageal junction. These results were called by telephone at the time of interpretation on 09/20/2017 at 1:12 am to Dr. Joseph Berkshire, who verbally acknowledged these results. Electronically Signed   By: Garald Balding M.D.   On: 09/20/2017 01:13   Dg Abd Portable 2v  Result Date: 09/22/2017 CLINICAL DATA:  Followup small bowel obstruction. EXAM: PORTABLE ABDOMEN - 2 VIEW COMPARISON:   09/21/2017 and older exams. FINDINGS: There are persistent dilated loops of small bowel, similar to prior exams, consistent with a high-grade partial obstruction. Air in stool is seen within a nondilated colon. IMPRESSION: 1. Persistent high-grade small bowel obstruction. No significant change in small bowel distension when compared to the prior studies. Electronically Signed   By: Lajean Manes M.D.   On: 09/22/2017 07:33   Dg Swallowing Func-speech Pathology  Result Date: 10/06/2017 Objective Swallowing Evaluation: Type of Study: MBS-Modified Barium Swallow Study  Patient Details Name: Kevin Humphrey MRN: [id number] Date of Birth: [birth year] Today's Date: 10/06/2017 Time: SLP Start Time (ACUTE ONLY): 1115 -SLP Stop Time (ACUTE ONLY): 1135 SLP Time Calculation (min) (ACUTE ONLY): 20 min Past Medical History: Past Medical History: Diagnosis Date . Allergic rhinitis 08/02/2012 . Depression 08/02/2012 . Dysphagia, unspecified 08/02/2012 . GERD (gastroesophageal reflux disease) 08/02/2012 . Other and unspecified hyperlipidemia 08/02/2012 . Schizophrenia ([place]) 08/02/2012 . Vertebral compression  fracture ([place]) 08/02/2012 Past Surgical History: Past Surgical History: Procedure Laterality Date . EXPLORATORY LAPAROTOMY    ??? (Has large midline incision) . LAPAROSCOPY N/A 09/22/2017  Procedure: LAPAROSCOPY DIAGNOSTIC  LYSIS OF ADHESIONS;  Surgeon: Michael Boston, MD;  Location: [facility];  Service: General;  Laterality: N/A; HPI: Patient is a 67 y.o. male with PMH: advanced dementia, schizophrenia, HTN, GERD, depression, dysphagia, who was brought to hospital from SNF secondary to persistent nausea with hematemesis. Chest x-ray at SNF showed some infiltrates and abdomen x-ray concering for bowel obstruction. Patient diagnosed at hospital with sepsis likely secondary to aspiration PNA, anemia, HTN, high-grade small bowel obstruction. on 6/1, he had laproscopy diagnositic lysis of adhesions.  Subjective: "I'm hungry. I wanna eat."  Assessment / Plan / Recommendation CHL IP CLINICAL IMPRESSIONS 10/06/2017 Clinical Impression Patient presents with what appears to be adequate strength and timing during this evaluation, which was limited due to pt agitation. Pt took several boluses of puree and a single teaspoon of thin liquid; no penetration or aspiration observed. There was no oral holding, timely anterior to posterior transit. Swallow initiation was timely with complete epiglottic deflection and airway closure. No residue remained in the pharynx after the swallow. An esophageal sweep was performed and was unremarkable. Consistent with bedside assessment, pt was tachypneic, though this appeared to be due to anxiety and was not associated with airway compromise. Pt does remain at risk for aspiration primarily due to cognitive deficits and mentation. Recommend dys 1, thin liquids with full supervision; ensure pt is fully alert and assist with self-feeding as able. Will follow for tolerance, possible progression of solids. SLP Visit Diagnosis Dysphagia, oropharyngeal phase (R13.12) Attention and concentration deficit following -- Frontal lobe and executive function deficit following -- Impact on safety and function Mild aspiration risk;Moderate aspiration risk   CHL IP TREATMENT RECOMMENDATION 10/06/2017 Treatment Recommendations Therapy as outlined in treatment plan below   Prognosis 10/06/2017 Prognosis for Safe Diet Advancement Fair Barriers to Reach Goals Cognitive deficits;Behavior Barriers/Prognosis Comment -- CHL IP DIET RECOMMENDATION 10/06/2017 SLP Diet Recommendations Dysphagia 1 (Puree) solids;Thin liquid Liquid Administration via Spoon;Cup Medication Administration Crushed with puree Compensations Slow rate;Small sips/bites;Minimize environmental distractions Postural Changes Remain semi-upright after after feeds/meals (Comment);Seated upright at 90 degrees   CHL IP OTHER RECOMMENDATIONS 10/06/2017 Recommended Consults -- Oral Care  Recommendations Oral care BID Other Recommendations --   CHL IP FOLLOW UP RECOMMENDATIONS 10/06/2017 Follow up Recommendations Other (comment)   CHL IP FREQUENCY AND DURATION 10/06/2017 Speech Therapy Frequency (ACUTE ONLY) min 2x/week Treatment Duration 2 weeks      CHL IP ORAL PHASE 10/06/2017 Oral Phase WFL Oral - Pudding Teaspoon -- Oral - Pudding Cup -- Oral - Honey Teaspoon -- Oral - Honey Cup -- Oral - Nectar Teaspoon -- Oral - Nectar Cup -- Oral - Nectar Straw -- Oral - Thin Teaspoon -- Oral - Thin Cup -- Oral - Thin Straw -- Oral - Puree -- Oral - Mech Soft -- Oral - Regular -- Oral - Multi-Consistency -- Oral - Pill -- Oral Phase - Comment --  CHL IP PHARYNGEAL PHASE 10/06/2017 Pharyngeal Phase WFL Pharyngeal- Pudding Teaspoon -- Pharyngeal -- Pharyngeal- Pudding Cup -- Pharyngeal -- Pharyngeal- Honey Teaspoon -- Pharyngeal -- Pharyngeal- Honey Cup -- Pharyngeal -- Pharyngeal- Nectar Teaspoon -- Pharyngeal -- Pharyngeal- Nectar Cup -- Pharyngeal -- Pharyngeal- Nectar Straw -- Pharyngeal -- Pharyngeal- Thin Teaspoon -- Pharyngeal -- Pharyngeal- Thin Cup -- Pharyngeal -- Pharyngeal- Thin Straw -- Pharyngeal -- Pharyngeal- Puree -- Pharyngeal --  Pharyngeal- Mechanical Soft -- Pharyngeal -- Pharyngeal- Regular -- Pharyngeal -- Pharyngeal- Multi-consistency -- Pharyngeal -- Pharyngeal- Pill -- Pharyngeal -- Pharyngeal Comment --  CHL IP CERVICAL ESOPHAGEAL PHASE 10/06/2017 Cervical Esophageal Phase WFL Pudding Teaspoon -- Pudding Cup -- Honey Teaspoon -- Honey Cup -- Nectar Teaspoon -- Nectar Cup -- Nectar Straw -- Thin Teaspoon -- Thin Cup -- Thin Straw -- Puree -- Mechanical Soft -- Regular -- Multi-consistency -- Pill -- Cervical Esophageal Comment -- Deneise Lever, MS, CCC-SLP Speech-Language Pathologist [phone] No flowsheet data found. Aliene Altes 10/06/2017, 1:02 PM                PERTINENT LAB RESULTS: CBC: Recent Labs    10/09/17 0553 10/10/17 0602  WBC 14.5* 11.8*  HGB 13.8 9.8*  HCT 43.1  29.8*  PLT 152 484*   CMET CMP     Component Value Date/Time   NA 139 10/10/2017 0602   NA 137 08/27/2015   K 3.3 (L) 10/10/2017 0602   CL 109 10/10/2017 0602   CO2 24 10/10/2017 0602   GLUCOSE 90 10/10/2017 0602   BUN 19 10/10/2017 0602   BUN 16 08/27/2015   CREATININE 0.68 10/10/2017 0602   CALCIUM 8.6 (L) 10/10/2017 0602   PROT 6.9 10/09/2017 0553   ALBUMIN 3.1 (L) 10/09/2017 0553   AST 36 10/09/2017 0553   ALT 28 10/09/2017 0553   ALKPHOS 66 10/09/2017 0553   BILITOT 0.8 10/09/2017 0553   GFRNONAA >60 10/10/2017 0602   GFRAA >60 10/10/2017 0602    GFR Estimated Creatinine Clearance: 90.8 mL/min (by C-G formula based on SCr of 0.68 mg/dL). No results for input(s): LIPASE, AMYLASE in the last 72 hours. No results for input(s): CKTOTAL, CKMB, CKMBINDEX, TROPONINI in the last 72 hours. Invalid input(s): POCBNP No results for input(s): DDIMER in the last 72 hours. No results for input(s): HGBA1C in the last 72 hours. No results for input(s): CHOL, HDL, LDLCALC, TRIG, CHOLHDL, LDLDIRECT in the last 72 hours. No results for input(s): TSH, T4TOTAL, T3FREE, THYROIDAB in the last 72 hours.  Invalid input(s): FREET3 No results for input(s): VITAMINB12, FOLATE, FERRITIN, TIBC, IRON, RETICCTPCT in the last 72 hours. Coags: No results for input(s): INR in the last 72 hours.  Invalid input(s): PT Microbiology: Recent Results (from the past 240 hour(s))  Culture, blood (Routine X 2) w Reflex to ID Panel     Status: None (Preliminary result)   Collection Time: 10/08/17  8:45 PM  Result Value Ref Range Status   Specimen Description BLOOD RIGHT ARM  Final   Special Requests   Final    BOTTLES DRAWN AEROBIC ONLY Blood Culture results may not be optimal due to an inadequate volume of blood received in culture bottles   Culture   Final    NO GROWTH < 24 HOURS Performed at [facility] [address] [place], [place] [address]    Report Status PENDING  Incomplete    Culture, blood (Routine X 2) w Reflex to ID Panel     Status: None (Preliminary result)   Collection Time: 10/08/17  8:50 PM  Result Value Ref Range Status   Specimen Description BLOOD RIGHT ARM  Final   Special Requests   Final    BOTTLES DRAWN AEROBIC ONLY Blood Culture results may not be optimal due to an inadequate volume of blood received in culture bottles   Culture   Final    NO GROWTH < 24 HOURS Performed at [facility], [place] [address], [place], [place] [address]    Report Status PENDING  Incomplete    FURTHER DISCHARGE INSTRUCTIONS:  Get Medicines reviewed and adjusted: Please take all your medications with you for your next visit with your Primary MD  Laboratory/radiological data: Please request your Primary MD to go over all hospital tests and procedure/radiological results at the follow up, please ask your Primary MD to get all Hospital records sent to his/her office.  In some cases, they will be blood work, cultures and biopsy results pending at the time of your discharge. Please request that your primary care M.D. goes through all the records of your hospital data and follows up on these results.  Also Note the following: If you experience worsening of your admission symptoms, develop shortness of breath, life threatening emergency, suicidal or homicidal thoughts you must seek medical attention immediately by calling 911 or calling your MD immediately  if symptoms less severe.  You must read complete instructions/literature along with all the possible adverse reactions/side effects for all the Medicines you take and that have been prescribed to you. Take any new Medicines after you have completely understood and accpet all the possible adverse reactions/side effects.   Do not drive when taking Pain medications or sleeping medications (Benzodaizepines)  Do not take more than prescribed Pain, Sleep and Anxiety Medications. It is not advisable to combine  anxiety,sleep and pain medications without talking with your primary care practitioner  Special Instructions: If you have smoked or chewed Tobacco  in the last 2 yrs please stop smoking, stop any regular Alcohol  and or any Recreational drug use.  Wear Seat belts while driving.  Please note: You were cared for by a hospitalist during your hospital stay. Once you are discharged, your primary care physician will handle any further medical issues. Please note that NO REFILLS for any discharge medications will be authorized once you are discharged, as it is imperative that you return to your primary care physician (or establish a relationship with a primary care physician if you do not have one) for your post hospital discharge needs so that they can reassess your need for medications and monitor your lab values.  Total Time spent coordinating discharge including counseling, education and face to face time equals 45 minutes.  SignedOren Binet 10/10/2017 12:06 PM

## 2017-10-13 LAB — CULTURE, BLOOD (ROUTINE X 2)
CULTURE: NO GROWTH
Culture: NO GROWTH

## 2017-10-14 ENCOUNTER — Inpatient Hospital Stay (HOSPITAL_COMMUNITY)
Admission: EM | Admit: 2017-10-14 | Discharge: 2017-10-24 | DRG: 100 | Disposition: A | Discharge status: Skilled Nursing Facility | Payer: Medicare Other | Attending: Internal Medicine | Admitting: Internal Medicine

## 2017-10-14 ENCOUNTER — Emergency Department (HOSPITAL_COMMUNITY): Payer: Medicare Other

## 2017-10-14 ENCOUNTER — Encounter (HOSPITAL_COMMUNITY): Payer: Self-pay | Admitting: Emergency Medicine

## 2017-10-14 ENCOUNTER — Other Ambulatory Visit: Payer: Self-pay

## 2017-10-14 DIAGNOSIS — D638 Anemia in other chronic diseases classified elsewhere: Secondary | ICD-10-CM | POA: Diagnosis not present

## 2017-10-14 DIAGNOSIS — Z9119 Patient's noncompliance with other medical treatment and regimen: Secondary | ICD-10-CM

## 2017-10-14 DIAGNOSIS — F209 Schizophrenia, unspecified: Secondary | ICD-10-CM | POA: Diagnosis not present

## 2017-10-14 DIAGNOSIS — Z79899 Other long term (current) drug therapy: Secondary | ICD-10-CM

## 2017-10-14 DIAGNOSIS — R1310 Dysphagia, unspecified: Secondary | ICD-10-CM | POA: Diagnosis present

## 2017-10-14 DIAGNOSIS — F0390 Unspecified dementia without behavioral disturbance: Secondary | ICD-10-CM | POA: Diagnosis present

## 2017-10-14 DIAGNOSIS — R569 Unspecified convulsions: Secondary | ICD-10-CM | POA: Diagnosis not present

## 2017-10-14 DIAGNOSIS — E8770 Fluid overload, unspecified: Secondary | ICD-10-CM | POA: Diagnosis not present

## 2017-10-14 DIAGNOSIS — E785 Hyperlipidemia, unspecified: Secondary | ICD-10-CM | POA: Diagnosis present

## 2017-10-14 DIAGNOSIS — R1312 Dysphagia, oropharyngeal phase: Secondary | ICD-10-CM | POA: Diagnosis present

## 2017-10-14 DIAGNOSIS — G40A01 Absence epileptic syndrome, not intractable, with status epilepticus: Secondary | ICD-10-CM | POA: Diagnosis not present

## 2017-10-14 DIAGNOSIS — J189 Pneumonia, unspecified organism: Secondary | ICD-10-CM | POA: Diagnosis present

## 2017-10-14 DIAGNOSIS — I10 Essential (primary) hypertension: Secondary | ICD-10-CM | POA: Diagnosis present

## 2017-10-14 DIAGNOSIS — J690 Pneumonitis due to inhalation of food and vomit: Secondary | ICD-10-CM | POA: Diagnosis present

## 2017-10-14 DIAGNOSIS — Z66 Do not resuscitate: Secondary | ICD-10-CM | POA: Diagnosis not present

## 2017-10-14 DIAGNOSIS — Z515 Encounter for palliative care: Secondary | ICD-10-CM | POA: Diagnosis not present

## 2017-10-14 DIAGNOSIS — Z87891 Personal history of nicotine dependence: Secondary | ICD-10-CM

## 2017-10-14 DIAGNOSIS — K219 Gastro-esophageal reflux disease without esophagitis: Secondary | ICD-10-CM | POA: Diagnosis present

## 2017-10-14 DIAGNOSIS — E876 Hypokalemia: Secondary | ICD-10-CM | POA: Diagnosis not present

## 2017-10-14 HISTORY — DX: Unspecified dementia, unspecified severity, without behavioral disturbance, psychotic disturbance, mood disturbance, and anxiety: F03.90

## 2017-10-14 LAB — CBC WITH DIFFERENTIAL/PLATELET
BASOS ABS: 0 10*3/uL (ref 0.0–0.1)
Basophils Relative: 0 %
Eosinophils Absolute: 0.2 10*3/uL (ref 0.0–0.7)
Eosinophils Relative: 1 %
HEMATOCRIT: 30.7 % — AB (ref 39.0–52.0)
Hemoglobin: 10 g/dL — ABNORMAL LOW (ref 13.0–17.0)
LYMPHS ABS: 2.1 10*3/uL (ref 0.7–4.0)
LYMPHS PCT: 16 %
MCH: 31.2 pg (ref 26.0–34.0)
MCHC: 32.6 g/dL (ref 30.0–36.0)
MCV: 95.6 fL (ref 78.0–100.0)
MONO ABS: 0.7 10*3/uL (ref 0.1–1.0)
MONOS PCT: 6 %
NEUTROS ABS: 9.7 10*3/uL — AB (ref 1.7–7.7)
Neutrophils Relative %: 77 %
Platelets: 437 10*3/uL — ABNORMAL HIGH (ref 150–400)
RBC: 3.21 MIL/uL — ABNORMAL LOW (ref 4.22–5.81)
RDW: 16.2 % — AB (ref 11.5–15.5)
WBC: 12.7 10*3/uL — ABNORMAL HIGH (ref 4.0–10.5)

## 2017-10-14 LAB — URINALYSIS, ROUTINE W REFLEX MICROSCOPIC
Bilirubin Urine: NEGATIVE
GLUCOSE, UA: 50 mg/dL — AB
Ketones, ur: 5 mg/dL — AB
Leukocytes, UA: NEGATIVE
Nitrite: NEGATIVE
Protein, ur: NEGATIVE mg/dL
RBC / HPF: >50 RBC/hpf — ABNORMAL HIGH (ref 0–5)
SPECIFIC GRAVITY, URINE: 1.02 (ref 1.005–1.030)
pH: 5 (ref 5.0–8.0)

## 2017-10-14 LAB — COMPREHENSIVE METABOLIC PANEL
ALBUMIN: 3 g/dL — AB (ref 3.5–5.0)
ALT: 15 U/L — ABNORMAL LOW (ref 17–63)
ANION GAP: 8 (ref 5–15)
AST: 17 U/L (ref 15–41)
Alkaline Phosphatase: 44 U/L (ref 38–126)
BUN: 15 mg/dL (ref 6–20)
CHLORIDE: 106 mmol/L (ref 101–111)
CO2: 25 mmol/L (ref 22–32)
Calcium: 8.5 mg/dL — ABNORMAL LOW (ref 8.9–10.3)
Creatinine, Ser: 0.69 mg/dL (ref 0.61–1.24)
GFR calc Af Amer: >60 mL/min (ref >60)
GFR calc non Af Amer: >60 mL/min (ref >60)
Glucose, Bld: 86 mg/dL (ref 65–99)
POTASSIUM: 3.8 mmol/L (ref 3.5–5.1)
Sodium: 139 mmol/L (ref 135–145)
Total Bilirubin: 1.1 mg/dL (ref 0.3–1.2)
Total Protein: 6.8 g/dL (ref 6.5–8.1)

## 2017-10-14 LAB — PROCALCITONIN

## 2017-10-14 LAB — I-STAT CG4 LACTIC ACID, ED: Lactic Acid, Venous: 0.79 mmol/L (ref 0.5–1.9)

## 2017-10-14 LAB — MAGNESIUM: Magnesium: 1.7 mg/dL (ref 1.7–2.4)

## 2017-10-14 LAB — PHOSPHORUS: PHOSPHORUS: 2.1 mg/dL — AB (ref 2.5–4.6)

## 2017-10-14 MED ORDER — LEVETIRACETAM IN NACL 1500 MG/100ML IV SOLN
1500.0000 mg | Freq: Two times a day (BID) | INTRAVENOUS | Status: DC
  Administered 2017-10-15 – 2017-10-21 (×13): 1500 mg via INTRAVENOUS
  Filled 2017-10-14 (×16): qty 100

## 2017-10-14 MED ORDER — ORAL CARE MOUTH RINSE
15.0000 mL | Freq: Two times a day (BID) | OROMUCOSAL | Status: DC
  Administered 2017-10-19 – 2017-10-24 (×7): 15 mL via OROMUCOSAL

## 2017-10-14 MED ORDER — IPRATROPIUM-ALBUTEROL 0.5-2.5 (3) MG/3ML IN SOLN: 3.0000 mL | Freq: Four times a day (QID) | RESPIRATORY_TRACT | Status: DC

## 2017-10-14 MED ORDER — CHLORHEXIDINE GLUCONATE 0.12 % MT SOLN
15.0000 mL | Freq: Two times a day (BID) | OROMUCOSAL | Status: DC
  Administered 2017-10-15 – 2017-10-24 (×15): 15 mL via OROMUCOSAL
  Filled 2017-10-14 (×16): qty 15

## 2017-10-14 MED ORDER — ONDANSETRON HCL 4 MG/2ML IJ SOLN
4.0000 mg | Freq: Four times a day (QID) | INTRAMUSCULAR | Status: DC | PRN
  Administered 2017-10-17 – 2017-10-24 (×7): 4 mg via INTRAVENOUS
  Filled 2017-10-14 (×7): qty 2

## 2017-10-14 MED ORDER — GUAIFENESIN 100 MG/5ML PO SOLN
400.0000 mg | Freq: Three times a day (TID) | ORAL | Status: DC
Start: 2017-10-14 — End: 2017-10-24
  Administered 2017-10-18 – 2017-10-19 (×2): 400 mg via ORAL
  Filled 2017-10-14: qty 25
  Filled 2017-10-14: qty 5
  Filled 2017-10-14: qty 25
  Filled 2017-10-14: qty 10
  Filled 2017-10-14: qty 5

## 2017-10-14 MED ORDER — BISACODYL 10 MG RE SUPP: 10.0000 mg | RECTAL | Status: DC | PRN

## 2017-10-14 MED ORDER — SODIUM CHLORIDE 0.9 % IV SOLN
200.0000 mg | Freq: Two times a day (BID) | INTRAVENOUS | Status: DC
  Administered 2017-10-15 – 2017-10-21 (×13): 200 mg via INTRAVENOUS
  Filled 2017-10-14 (×17): qty 20

## 2017-10-14 MED ORDER — SODIUM CHLORIDE 0.9 % IV SOLN
100.0000 mg | INTRAVENOUS | Status: AC
  Administered 2017-10-14: 100 mg via INTRAVENOUS
  Filled 2017-10-14: qty 10

## 2017-10-14 MED ORDER — VITAMIN D (ERGOCALCIFEROL) 1.25 MG (50000 UNIT) PO CAPS: 50000.0000 [IU] | ORAL_CAPSULE | ORAL | Status: DC

## 2017-10-14 MED ORDER — CHLORHEXIDINE GLUCONATE CLOTH 2 % EX PADS
6.0000 | MEDICATED_PAD | Freq: Every day | CUTANEOUS | Status: AC
  Administered 2017-10-15 – 2017-10-18 (×3): 6 via TOPICAL

## 2017-10-14 MED ORDER — LEVETIRACETAM IN NACL 1000 MG/100ML IV SOLN
1000.0000 mg | Freq: Once | INTRAVENOUS | Status: AC
  Administered 2017-10-14: 1000 mg via INTRAVENOUS
  Filled 2017-10-14: qty 100

## 2017-10-14 MED ORDER — ONDANSETRON HCL 4 MG PO TABS: 4.0000 mg | ORAL_TABLET | Freq: Three times a day (TID) | ORAL | Status: DC | PRN

## 2017-10-14 MED ORDER — SODIUM CHLORIDE 0.9 % IV SOLN
INTRAVENOUS | Status: DC
Start: 2017-10-14 — End: 2017-10-17
  Administered 2017-10-14 – 2017-10-16 (×4): via INTRAVENOUS

## 2017-10-14 MED ORDER — IPRATROPIUM-ALBUTEROL 0.5-2.5 (3) MG/3ML IN SOLN
3.0000 mL | Freq: Four times a day (QID) | RESPIRATORY_TRACT | Status: DC
  Administered 2017-10-15: 3 mL via RESPIRATORY_TRACT
  Filled 2017-10-14: qty 3

## 2017-10-14 MED ORDER — MUPIROCIN 2 % EX OINT
1.0000 "application " | TOPICAL_OINTMENT | Freq: Two times a day (BID) | CUTANEOUS | Status: AC
  Administered 2017-10-14 – 2017-10-19 (×9): 1 via NASAL
  Filled 2017-10-14 (×3): qty 22

## 2017-10-14 MED ORDER — LEVOFLOXACIN IN D5W 500 MG/100ML IV SOLN
500.0000 mg | Freq: Every day | INTRAVENOUS | Status: DC
  Administered 2017-10-14 – 2017-10-16 (×3): 500 mg via INTRAVENOUS
  Filled 2017-10-14 (×3): qty 100

## 2017-10-14 MED ORDER — ADULT MULTIVITAMIN W/MINERALS CH
1.0000 | ORAL_TABLET | Freq: Every day | ORAL | Status: DC
  Administered 2017-10-21: 1 via ORAL
  Filled 2017-10-14 (×2): qty 1

## 2017-10-14 MED ORDER — ENSURE ENLIVE PO LIQD
237.0000 mL | Freq: Two times a day (BID) | ORAL | Status: DC
Start: 2017-10-15 — End: 2017-10-24
  Administered 2017-10-21: 237 mL via ORAL

## 2017-10-14 MED ORDER — LISINOPRIL 5 MG PO TABS
5.0000 mg | ORAL_TABLET | Freq: Every day | ORAL | Status: DC
Start: 2017-10-14 — End: 2017-10-21
  Administered 2017-10-20 – 2017-10-21 (×2): 5 mg via ORAL
  Filled 2017-10-14 (×2): qty 1

## 2017-10-14 MED ORDER — ACETAMINOPHEN 325 MG PO TABS: 650.0000 mg | ORAL_TABLET | Freq: Four times a day (QID) | ORAL | Status: DC | PRN

## 2017-10-14 MED ORDER — HYDRALAZINE HCL 20 MG/ML IJ SOLN
10.0000 mg | Freq: Four times a day (QID) | INTRAMUSCULAR | Status: DC | PRN
  Administered 2017-10-14 – 2017-10-17 (×2): 10 mg via INTRAVENOUS
  Filled 2017-10-14 (×3): qty 1

## 2017-10-14 MED ORDER — LORAZEPAM 2 MG/ML IJ SOLN
2.0000 mg | INTRAMUSCULAR | Status: DC | PRN
  Administered 2017-10-19 – 2017-10-24 (×10): 2 mg via INTRAVENOUS
  Filled 2017-10-14 (×15): qty 1

## 2017-10-14 MED ORDER — FERROUS SULFATE 325 (65 FE) MG PO TABS: 325.0000 mg | ORAL_TABLET | Freq: Every day | ORAL | Status: DC

## 2017-10-14 MED ORDER — ONDANSETRON HCL 4 MG PO TABS: 4.0000 mg | ORAL_TABLET | Freq: Four times a day (QID) | ORAL | Status: DC | PRN

## 2017-10-14 NOTE — H&P (Signed)
History and Physical    Kayler Rise JJH:[id number] DOB: 17-Jun-1950 DOA: 10/14/2017  PCP: [name], MD  Patient coming from: Nursing home  Chief Complaint: Seizures and refusing meds  HPI: Renton Berkley is a 67 y.o. male with medical history significant of dementia, schizophrenia, aspiration pneumonia, seizure disorder sent in from nursing home for at least 6 seizures that occurred today.  Apparently patient patient was recently diagnosed with pneumonia last week and placed on antibiotics however he has been refusing to take his oral medications.  He is not been able to take any medications for over 4 days.  Patient is post to be on Levaquin but the nursing home could not get it for the last 2 days so he is not been actually getting it.  Apparently had a chest x-ray last week that indicated pneumonia and a fever.  Patient cannot provide any history due to his postictal state.  Patient has been referred for admission for uncontrolled seizures.   Review of Systems: Unobtainable from patient due to his dementia  Past Medical History:  Diagnosis Date  . Allergic rhinitis 08/02/2012  . Depression 08/02/2012  . Dysphagia, unspecified 08/02/2012  . GERD (gastroesophageal reflux disease) 08/02/2012  . Other and unspecified hyperlipidemia 08/02/2012  . Schizophrenia ([place]) 08/02/2012  . Vertebral compression fracture ([place]) 08/02/2012    Past Surgical History:  Procedure Laterality Date  . EXPLORATORY LAPAROTOMY     ??? (Has large midline incision)  . LAPAROSCOPY N/A 09/22/2017   Procedure: LAPAROSCOPY DIAGNOSTIC  LYSIS OF ADHESIONS;  Surgeon: Michael Boston, MD;  Location: [facility];  Service: General;  Laterality: N/A;     reports that he has quit smoking. He has never used smokeless tobacco. He reports that he drank alcohol. He reports that he has current or past drug history.  No Known Allergies  Family History  Family history unknown: Yes  Unknown due to patient dementia  Prior  to Admission medications   Medication Sig Start Date End Date Taking? Authorizing Provider  bisacodyl (DULCOLAX) 10 MG suppository Place 10 mg rectally as needed for moderate constipation.   Yes [provider]  Cholecalciferol (VITAMIN D3) 50000 units CAPS Take 50,000 Units by mouth every 30 (thirty) days. On the 15th   Yes [provider]  feeding supplement, ENSURE ENLIVE, (ENSURE ENLIVE) LIQD Take 237 mLs by mouth 2 (two) times daily between meals. 10/10/17  Yes Ghimire, Henreitta Leber, MD  guaiFENesin (ROBITUSSIN) 100 MG/5ML liquid Take 400 mg by mouth 3 (three) times daily. 5 Day Course. Start Date 10/12/17.   Yes [provider]  ipratropium-albuterol (DUONEB) 0.5-2.5 (3) MG/3ML SOLN Take 3 mLs by nebulization every 6 (six) hours.   Yes [provider]  lisinopril (PRINIVIL,ZESTRIL) 5 MG tablet Take 5 mg by mouth daily.   Yes [provider]  LORAZEPAM PO Apply 1 mg topically every 6 (six) hours as needed (seizures).   Yes [provider]  Multiple Vitamin (MULTIVITAMIN) tablet Take 1 tablet by mouth daily.   Yes [provider]  ondansetron (ZOFRAN) 4 MG tablet Take 4 mg by mouth every 8 (eight) hours as needed for nausea or vomiting.   Yes [provider]  polyethylene glycol (MIRALAX / GLYCOLAX) packet Take 17 g by mouth 2 (two) times daily. Patient taking differently: Take 17 g by mouth daily.  10/10/17  Yes Ghimire, Henreitta Leber, MD  Probiotic Product (PROBIOTIC PO) Take 1 capsule by mouth daily. 14 Day Course. Start Date 10/12/17.  Yes [provider]  Propylene Glycol (SYSTANE BALANCE) 0.6 % SOLN Apply 1 drop to eye 2 (two) times daily. For dry eyes   Yes [provider]  ranitidine (ZANTAC) 75 MG tablet Take 75 mg by mouth daily.   Yes [provider]  acetaminophen (TYLENOL) 325 MG tablet Take 2 tablets (650 mg total) by mouth every 6 (six) hours as needed for mild pain, moderate pain, fever or  headache. 10/10/17   Ghimire, Henreitta Leber, MD  ferrous sulfate 325 (65 FE) MG tablet Take 325 mg by mouth daily.    [provider]  lacosamide (VIMPAT) 200 MG TABS tablet Take 1 tablet (200 mg total) by mouth 2 (two) times daily. 10/10/17   Ghimire, Henreitta Leber, MD  levETIRAcetam (KEPPRA) 500 MG tablet Take 3 tablets (1,500 mg total) by mouth 2 (two) times daily. 10/10/17   Jonetta Osgood, MD    Physical Exam: Vitals:   10/14/17 1900 10/14/17 1930 10/14/17 2000 10/14/17 2030  BP: (!) 179/73 (!) 166/75 (!) 160/66 (!) 163/76  Pulse: 71 74 76 76  Resp: (!) 21 20 (!) 24 (!) 24  Temp:      TempSrc:      SpO2: 97% 95% 95% 97%      Constitutional: NAD, calm, no apparent seizure activity Vitals:   10/14/17 1900 10/14/17 1930 10/14/17 2000 10/14/17 2030  BP: (!) 179/73 (!) 166/75 (!) 160/66 (!) 163/76  Pulse: 71 74 76 76  Resp: (!) 21 20 (!) 24 (!) 24  Temp:      TempSrc:      SpO2: 97% 95% 95% 97%   Eyes: PERRL, lids and conjunctivae normal ENMT: Mucous membranes are moist. Posterior pharynx clear of any exudate or lesions.Normal dentition.  Neck: normal, supple, no masses, no thyromegaly Respiratory: clear to auscultation bilaterally, no wheezing, no crackles. Normal respiratory effort. No accessory muscle use.  Cardiovascular: Regular rate and rhythm, no murmurs / rubs / gallops. No extremity edema. 2+ pedal pulses. No carotid bruits.  Abdomen: no tenderness, no masses palpated. No hepatosplenomegaly. Bowel sounds positive.  Musculoskeletal: no clubbing / cyanosis. No joint deformity upper and lower extremities. Good ROM, no contractures. Normal muscle tone.  Skin: no rashes, lesions, ulcers. No induration Neurologic: Cannot participate or follow any commands no overt seizure activity psychiatric: No agitation  Labs on Admission: I have personally reviewed following labs and imaging studies  CBC: Recent Labs  Lab 10/08/17 2055 10/09/17 0518 10/09/17 0553 10/10/17 0602  10/14/17 1834  WBC 22.1* 16.6* 14.5* 11.8* 12.7*  NEUTROABS 17.7*  --   --  7.1 9.7*  HGB 10.5* 9.4* 13.8 9.8* 10.0*  HCT 31.7* 28.7* 43.1 29.8* 30.7*  MCV 91.9 93.5 94.3 94.3 95.6  PLT 355 397 152 484* 782*   Basic Metabolic Panel: Recent Labs  Lab 10/08/17 0517 10/08/17 2055 10/09/17 0518 10/09/17 0553 10/10/17 0602 10/14/17 1834  NA 140 139 139 140 139 139  K 3.5 5.1 4.4 4.3 3.3* 3.8  CL 111 109 107 107 109 106  CO2 22 19* 25 24 24 25   GLUCOSE 97 117* 213* 90 90 86  BUN 17 23* 18 19 19 15   CREATININE 0.73 1.32* 1.05 1.09 0.68 0.69  CALCIUM 8.4* 8.9 8.4* 8.8* 8.6* 8.5*  MG 1.8  --   --   --  1.7 1.7  PHOS  --   --   --   --   --  2.1*   GFR: Estimated Creatinine Clearance:  90.8 mL/min (by C-G formula based on SCr of 0.69 mg/dL). Liver Function Tests: Recent Labs  Lab 10/08/17 2055 10/09/17 0553 10/14/17 1834  AST 57* 36 17  ALT 34 28 15*  ALKPHOS 74 66 44  BILITOT 1.7* 0.8 1.1  PROT 7.0 6.9 6.8  ALBUMIN 3.3* 3.1* 3.0*   No results for input(s): LIPASE, AMYLASE in the last 168 hours. No results for input(s): AMMONIA in the last 168 hours. Coagulation Profile: No results for input(s): INR, PROTIME in the last 168 hours. Cardiac Enzymes: No results for input(s): CKTOTAL, CKMB, CKMBINDEX, TROPONINI in the last 168 hours. BNP (last 3 results) No results for input(s): PROBNP in the last 8760 hours. HbA1C: No results for input(s): HGBA1C in the last 72 hours. CBG: Recent Labs  Lab 10/09/17 1917 10/09/17 2341 10/10/17 0334 10/10/17 0830 10/10/17 1131  GLUCAP 95 93 95 95 104*   Lipid Profile: No results for input(s): CHOL, HDL, LDLCALC, TRIG, CHOLHDL, LDLDIRECT in the last 72 hours. Thyroid Function Tests: No results for input(s): TSH, T4TOTAL, FREET4, T3FREE, THYROIDAB in the last 72 hours. Anemia Panel: No results for input(s): VITAMINB12, FOLATE, FERRITIN, TIBC, IRON, RETICCTPCT in the last 72 hours. Urine analysis:    Component Value Date/Time    COLORURINE YELLOW 10/14/2017 [place] 10/14/2017 1650   LABSPEC 1.020 10/14/2017 1650   PHURINE 5.0 10/14/2017 1650   GLUCOSEU 50 (A) 10/14/2017 1650   HGBUR MODERATE (A) 10/14/2017 1650   BILIRUBINUR NEGATIVE 10/14/2017 1650   KETONESUR 5 (A) 10/14/2017 1650   PROTEINUR NEGATIVE 10/14/2017 1650   UROBILINOGEN 0.2 12/20/2014 1157   NITRITE NEGATIVE 10/14/2017 1650   LEUKOCYTESUR NEGATIVE 10/14/2017 1650   Sepsis Labs: !!!!!!!!!!!!!!!!!!!!!!!!!!!!!!!!!!!!!!!!!!!! @LABRCNTIP (procalcitonin:4,lacticidven:4) ) Recent Results (from the past 240 hour(s))  Culture, blood (Routine X 2) w Reflex to ID Panel     Status: None   Collection Time: 10/08/17  8:45 PM  Result Value Ref Range Status   Specimen Description BLOOD RIGHT ARM  Final   Special Requests   Final    BOTTLES DRAWN AEROBIC ONLY Blood Culture results may not be optimal due to an inadequate volume of blood received in culture bottles   Culture   Final    NO GROWTH 5 DAYS Performed at [facility], [place] [address] [place], [place] [address]    Report Status 10/13/2017 FINAL  Final  Culture, blood (Routine X 2) w Reflex to ID Panel     Status: None   Collection Time: 10/08/17  8:50 PM  Result Value Ref Range Status   Specimen Description BLOOD RIGHT ARM  Final   Special Requests   Final    BOTTLES DRAWN AEROBIC ONLY Blood Culture results may not be optimal due to an inadequate volume of blood received in culture bottles   Culture   Final    NO GROWTH 5 DAYS Performed at [facility], [place] [address] [place], [place] [address]    Report Status 10/13/2017 FINAL  Final     Radiological Exams on Admission: Dg Chest 2 View  Result Date: 10/14/2017 CLINICAL DATA:  Seizures. EXAM: CHEST - 2 VIEW COMPARISON:  10/08/2017 FINDINGS: The cardiac silhouette, mediastinal and hilar contours are within normal limits and stable for the AP projection. Improved aeration since the prior study with probable  clearing infiltrates. Chronic underlying lung disease is again demonstrated with fairly extensive basilar pulmonary scarring. No definite acute overlying pulmonary process. Numerous healed bilateral rib fractures. No obvious  acute fractures. IMPRESSION: No definite acute pulmonary findings. Significant chronic lung disease. Numerous remote healed rib fractures.  No definite acute fractures. Electronically Signed   By: Marijo Sanes M.D.   On: 10/14/2017 17:00    Old chart reviewed Case discussed with EDP  Assessment/Plan 67 year old male ward the state with a history of dementia, schizophrenia, seizure disorder comes in with uncontrolled seizures Principal Problem:   Convulsions/seizures (HCC)-placed on IV Vimpat and Keppra.  Give a dose of Ativan.  PRN Ativan ordered.  Placed on seizure precautions.  Has known seizure disorder.  Secondary to noncompliance with oral meds last 4 days.  Active Problems:   Schizophrenia (HCC)-continue home meds    Pneumonia probable-placed on IV Levaquin    Anemia, chronic disease stable-    Dementia-stable  Obtain social work consult patient is what of status unclear whether or not he is hospice at the nursing home or not.  Per discharge summary from last week he was supposed to follow-up with palliative care as an outpatient unclear if this actually occurred.  Patient is DNR.  DVT prophylaxis: SCDs Code Status: DNR Family Communication: None Disposition Plan: 1 to 2 days Consults called: None Admission status: Observation   Maleak Brazzel A MD [facility]  If 7PM-7AM, please contact night-coverage [email or web address] Password [facility]  10/14/2017, 9:26 PM

## 2017-10-14 NOTE — ED Notes (Signed)
Bed: WA03 Expected date:  Expected time:  Means of arrival:  Comments: 67 yo seizures

## 2017-10-14 NOTE — ED Triage Notes (Signed)
Pt has been at [facility] since Wednesday. Has had 6 seizures in last hour. Cognitive impairments at baseline and is wheelchair bound. Received 2mg  of Ativan. Currently sedated. Hx of epilepsy.

## 2017-10-14 NOTE — ED Provider Notes (Signed)
[facility] DEPT Provider Note   CSN: [id number] Arrival date & time: 10/14/17  1327     History   Chief Complaint Chief Complaint  Patient presents with  . Seizures    HPI Kevin Humphrey is a 67 y.o. male with a history of dementia, schizophrenia, dysphagia, depression, GERD, and seizure disorder who presents to the emergency department by EMS from [facility] with a chief complaint of seizure.  Spoke with Danae Chen, LPN at [facility], he reports that the patient return to [facility] 5 days ago after he was admitted from 5/29-6/19 for sepsis secondary to aspiration pneumonitis, acute hypoxic respiratory failure, SBO, and acute metabolic encephalopathy.  Danae Chen reports that the patient had a fever of 101.1 on Wednesday.  Basic labs, UA, and CXR were checked.  Chest x-ray was consistent with pneumonia and he was started on oral Levaquin.  She reports that 4 days ago the patient began to refuse all of his medications by mouth.   3 days ago, he was given Invanz IM, but did not receive any of his other medications.  Yesterday, IV Levaquin was ordered, but the patient has not received any doses as it is on a ride to the pharmacy yet.  It is now been 4 days since he has received any of his seizure medications.  Danae Chen reports that she was called into the patient's room around 11 AM because the patient was having a seizure.  She describes his seizure activity as full body shaking and jerking with his legs up in the air.  She says it was very intense and his eyes were closed with heavy breathing.  He was given 1 mg of IM Ativan, but continued to have a 2 additional seizures that lasted for 2 minutes each.  He was then given a second dose of 1 mg IM Ativan, but continued to have 3 more episodes of seizures within the next 30 minutes so EMS was called.  Nursing staff reports the patient was afebrile this a.m.  In ED, the patient is alert, but lethargic.   Level 5 caveat  secondary to status change.   The history is provided by the patient. No language interpreter was used.  Seizures      Past Medical History:  Diagnosis Date  . Allergic rhinitis 08/02/2012  . Depression 08/02/2012  . Dysphagia, unspecified 08/02/2012  . GERD (gastroesophageal reflux disease) 08/02/2012  . Other and unspecified hyperlipidemia 08/02/2012  . Schizophrenia ([place]) 08/02/2012  . Vertebral compression fracture ([place]) 08/02/2012    Patient Active Problem List   Diagnosis Date Noted  . Seizure ([place]) 10/14/2017  . Dementia 10/14/2017  . Agitation   . Decreased oral intake   . Hyperammonemia ([place])   . Volume overload 09/24/2017  . Hypokalemia   . Acute metabolic encephalopathy 46/50/3546  . Ileus ([place]) 09/23/2017  . Acute lower UTI 09/22/2017  . Anxiety state 09/21/2017  . Small bowel obstruction ([place])   . Hypomagnesemia   . SBO (small bowel obstruction) s/p lap adhesiolysis 09/22/2017 09/20/2017  . ARF (acute renal failure) ([place]) 09/20/2017  . Hematemesis 09/20/2017  . Sepsis ([place]) 09/20/2017  . HCAP (healthcare-associated pneumonia)   . Ventilator dependent ([place])   . Acute cystitis with hematuria   . Bacteremia   . Accelerated hypertension   . Anemia, chronic disease   . Acute kidney injury ([place])   . Hypernatremia   . Protein-calorie malnutrition ([place])   . Acute encephalopathy   . Palliative care encounter 12/17/2014  .  Dysphagia 12/17/2014  . Acute respiratory failure with hypoxia ([place]) 12/14/2014  . Staphylococcal pneumonia ([place]) 12/14/2014  . Septic shock ([place]) 12/14/2014  . AKI (acute kidney injury) ([place]) 12/14/2014  . Respiratory failure ([place])   . Pneumonia 12/12/2014  . Malnutrition of moderate degree ([place]) 12/12/2014  . Pressure ulcer 12/12/2014  . Convulsions/seizures ([place]) 08/20/2013  . Other and unspecified hyperlipidemia 08/02/2012  . GERD (gastroesophageal reflux disease) 08/02/2012  . Schizophrenia ([place]) 08/02/2012  . Depression 08/02/2012  .  Allergic rhinitis 08/02/2012  . Dysphagia, unspecified(787.20) 08/02/2012  . Vertebral compression fracture ([place]) 08/02/2012    Past Surgical History:  Procedure Laterality Date  . EXPLORATORY LAPAROTOMY     ??? (Has large midline incision)  . LAPAROSCOPY N/A 09/22/2017   Procedure: LAPAROSCOPY DIAGNOSTIC  LYSIS OF ADHESIONS;  Surgeon: Michael Boston, MD;  Location: [facility];  Service: General;  Laterality: N/A;        Home Medications    Prior to Admission medications   Medication Sig Start Date End Date Taking? Authorizing Provider  bisacodyl (DULCOLAX) 10 MG suppository Place 10 mg rectally as needed for moderate constipation.   Yes [provider]  Cholecalciferol (VITAMIN D3) 50000 units CAPS Take 50,000 Units by mouth every 30 (thirty) days. On the 15th   Yes [provider]  feeding supplement, ENSURE ENLIVE, (ENSURE ENLIVE) LIQD Take 237 mLs by mouth 2 (two) times daily between meals. 10/10/17  Yes Ghimire, Henreitta Leber, MD  guaiFENesin (ROBITUSSIN) 100 MG/5ML liquid Take 400 mg by mouth 3 (three) times daily. 5 Day Course. Start Date 10/12/17.   Yes [provider]  ipratropium-albuterol (DUONEB) 0.5-2.5 (3) MG/3ML SOLN Take 3 mLs by nebulization every 6 (six) hours.   Yes [provider]  lisinopril (PRINIVIL,ZESTRIL) 5 MG tablet Take 5 mg by mouth daily.   Yes [provider]  LORAZEPAM PO Apply 1 mg topically every 6 (six) hours as needed (seizures).   Yes [provider]  Multiple Vitamin (MULTIVITAMIN) tablet Take 1 tablet by mouth daily.   Yes [provider]  ondansetron (ZOFRAN) 4 MG tablet Take 4 mg by mouth every 8 (eight) hours as needed for nausea or vomiting.   Yes [provider]  polyethylene glycol (MIRALAX / GLYCOLAX) packet Take 17 g by mouth 2 (two) times daily. Patient taking differently: Take 17 g by mouth daily.  10/10/17  Yes Ghimire, Henreitta Leber, MD  Probiotic Product (PROBIOTIC PO) Take 1  capsule by mouth daily. 14 Day Course. Start Date 10/12/17.   Yes [provider]  Propylene Glycol (SYSTANE BALANCE) 0.6 % SOLN Apply 1 drop to eye 2 (two) times daily. For dry eyes   Yes [provider]  ranitidine (ZANTAC) 75 MG tablet Take 75 mg by mouth daily.   Yes [provider]  acetaminophen (TYLENOL) 325 MG tablet Take 2 tablets (650 mg total) by mouth every 6 (six) hours as needed for mild pain, moderate pain, fever or headache. 10/10/17   Ghimire, Henreitta Leber, MD  ferrous sulfate 325 (65 FE) MG tablet Take 325 mg by mouth daily.    [provider]  lacosamide (VIMPAT) 200 MG TABS tablet Take 1 tablet (200 mg total) by mouth 2 (two) times daily. 10/10/17   Ghimire, Henreitta Leber, MD  levETIRAcetam (KEPPRA) 500 MG tablet Take 3 tablets (1,500 mg total) by mouth 2 (two) times daily. 10/10/17   Ghimire, Henreitta Leber, MD    Family History Family History  Family history unknown:  Yes    Social History Social History   Tobacco Use  . Smoking status: Former Research scientist (life sciences)  . Smokeless tobacco: Never Used  Substance Use Topics  . Alcohol use: Not Currently  . Drug use: Not Currently     Allergies   Patient has no known allergies.   Review of Systems Review of Systems  Unable to perform ROS: Mental status change  Constitutional: Positive for fever.  Neurological: Positive for seizures.   Physical Exam Updated Vital Signs BP (!) 177/77 (BP Location: Right Arm)   Pulse 82   Temp 98.4 F (36.9 C) (Oral)   Resp 20   Ht 5\' 11"  (1.803 m)   Wt 68.1 kg (150 lb 2.1 oz)   SpO2 95%   BMI 20.94 kg/m   Physical Exam  Constitutional: He appears well-developed.  Chronically ill appearing male.  Appears older than his actual age.  HENT:  Head: Normocephalic and atraumatic.  Eyes: Conjunctivae are normal.  Neck: Neck supple.  Cardiovascular: Normal rate, regular rhythm, normal heart sounds and intact distal pulses. Exam reveals no gallop and no friction rub.    No murmur heard. Pulmonary/Chest: Effort normal. No stridor. No respiratory distress. He has no wheezes. He has no rales.  Abdominal: Soft. He exhibits no distension and no mass. There is no tenderness. There is no rebound and no guarding. No hernia.  Musculoskeletal:  Healing area of ecchymosis noted to the left forearm.  Neurological: He is alert.  Skin: Skin is warm and dry. No rash noted. No erythema.  Pallor.  Psychiatric: His behavior is normal.  Nursing note and vitals reviewed.  ED Treatments / Results  Labs (all labs ordered are listed, but only abnormal results are displayed) Labs Reviewed  COMPREHENSIVE METABOLIC PANEL - Abnormal; Notable for the following components:      Result Value   Calcium 8.5 (*)    Albumin 3.0 (*)    ALT 15 (*)    All other components within normal limits  CBC WITH DIFFERENTIAL/PLATELET - Abnormal; Notable for the following components:   WBC 12.7 (*)    RBC 3.21 (*)    Hemoglobin 10.0 (*)    HCT 30.7 (*)    RDW 16.2 (*)    Platelets 437 (*)    Neutro Abs 9.7 (*)    All other components within normal limits  URINALYSIS, ROUTINE W REFLEX MICROSCOPIC - Abnormal; Notable for the following components:   Glucose, UA 50 (*)    Hgb urine dipstick MODERATE (*)    Ketones, ur 5 (*)    RBC / HPF >50 (*)    Bacteria, UA RARE (*)    All other components within normal limits  PHOSPHORUS - Abnormal; Notable for the following components:   Phosphorus 2.1 (*)    All other components within normal limits  URINE CULTURE  MAGNESIUM  PROCALCITONIN  BASIC METABOLIC PANEL  CBC  I-STAT CG4 LACTIC ACID, ED  I-STAT CG4 LACTIC ACID, ED    EKG None  Radiology Dg Chest 2 View  Result Date: 10/14/2017 CLINICAL DATA:  Seizures. EXAM: CHEST - 2 VIEW COMPARISON:  10/08/2017 FINDINGS: The cardiac silhouette, mediastinal and hilar contours are within normal limits and stable for the AP projection. Improved aeration since the prior study with probable clearing  infiltrates. Chronic underlying lung disease is again demonstrated with fairly extensive basilar pulmonary scarring. No definite acute overlying pulmonary process. Numerous healed bilateral rib fractures. No obvious acute fractures. IMPRESSION: No definite acute pulmonary  findings. Significant chronic lung disease. Numerous remote healed rib fractures.  No definite acute fractures. Electronically Signed   By: Marijo Sanes M.D.   On: 10/14/2017 17:00    Procedures Procedures (including critical care time)  Medications Ordered in ED Medications  multivitamin with minerals tablet 1 tablet (1 tablet Oral Not Given 10/14/17 2320)  Vitamin D (Ergocalciferol) (DRISDOL) capsule 50,000 Units (has no administration in time range)  ferrous sulfate tablet 325 mg (325 mg Oral Not Given 10/14/17 2320)  lisinopril (PRINIVIL,ZESTRIL) tablet 5 mg (5 mg Oral Not Given 10/14/17 2320)  acetaminophen (TYLENOL) tablet 650 mg (has no administration in time range)  feeding supplement (ENSURE ENLIVE) (ENSURE ENLIVE) liquid 237 mL (has no administration in time range)  guaiFENesin (ROBITUSSIN) 100 MG/5ML solution 400 mg (400 mg Oral Not Given 10/14/17 2320)  bisacodyl (DULCOLAX) suppository 10 mg (has no administration in time range)  ondansetron (ZOFRAN) tablet 4 mg (has no administration in time range)  lacosamide (VIMPAT) 200 mg in sodium chloride 0.9 % 25 mL IVPB (has no administration in time range)  levETIRAcetam (KEPPRA) IVPB 1500 mg/ 100 mL premix (has no administration in time range)  LORazepam (ATIVAN) injection 2 mg (has no administration in time range)  0.9 %  sodium chloride infusion ( Intravenous Transfusing/Transfer 10/14/17 2231)  ondansetron (ZOFRAN) tablet 4 mg (has no administration in time range)    Or  ondansetron (ZOFRAN) injection 4 mg (has no administration in time range)  levofloxacin (LEVAQUIN) IVPB 500 mg (500 mg Intravenous New Bag/Given 10/14/17 2346)  ipratropium-albuterol (DUONEB) 0.5-2.5  (3) MG/3ML nebulizer solution 3 mL (has no administration in time range)  hydrALAZINE (APRESOLINE) injection 10 mg (10 mg Intravenous Given 10/14/17 2350)  mupirocin ointment (BACTROBAN) 2 % 1 application (1 application Nasal Given 10/14/17 2346)  Chlorhexidine Gluconate Cloth 2 % PADS 6 each (has no administration in time range)  chlorhexidine (PERIDEX) 0.12 % solution 15 mL (15 mLs Mouth Rinse Not Given 10/14/17 2346)  MEDLINE mouth rinse (has no administration in time range)  levETIRAcetam (KEPPRA) IVPB 1000 mg/100 mL premix (0 mg Intravenous Stopped 10/14/17 1906)  lacosamide (VIMPAT) 100 mg in sodium chloride 0.9 % 25 mL IVPB (0 mg Intravenous Stopped 10/14/17 1906)     Initial Impression / Assessment and Plan / ED Course  I have reviewed the triage vital signs and the nursing notes.  Pertinent labs & imaging results that were available during my care of the patient were reviewed by me and considered in my medical decision making (see chart for details).     67 year old male with a history of schizophrenia, dysphagia, depression, GERD, and seizure disorder who presents to the emergency department by EMS from [facility] with a chief complaint of seizure.  Spoke with nursing staff at the patient's SNF. he had a fever 5 days ago and a chest x-ray demonstrated pneumonia.  The patient has not taken any doses of his seizure medications for the last 4 days as he has been refusing them.  He was given 1 dose of oral Levaquin 5 days ago and 1 dose of IM Invanz 3 days ago, but has received no other doses of antibiotics.  The patient was discussed and evaluated with Dr. Jeneen Rinks, attending physician.  Will order 1000 mg dose of IV Keppra and 100 mg dose of Vimpat, labs, chest x-ray, and UA.  He has had no seizure activity since arrival in ED.  Labs are notable for leukocytosis of 12.7, elevated neutrophil count of 9.7, and  hypophosphatemia of 2.1.  UA with moderate hemoglobinuria and RBCs.  Urine culture  sent.  Medical records reviewed.  Note from speech-language pathologist on June 15 with diagnosis of dysphagia, oropharyngeal phase.  Given history of dysphagia and recent increase in seizure activity, I feel the patient warrants admission.  The patient was seen and evaluated along with Dr. Jeneen Rinks, attending physician.  Spoke with Dr. Shanon Brow, hospitalist, requested the patient attempted a fluid challenge.  Nursing staff reports that the patient was unable to follow commands for a fluid challenge and was unable to complete.  The patient has been accepted for admission.  The patient appears reasonably stabilized for admission considering the current resources, flow, and capabilities available in the ED at this time, and I doubt any other [facility] requiring further screening and/or treatment in the ED prior to admission.  Final Clinical Impressions(s) / ED Diagnoses   Final diagnoses:  Seizure [facility])    ED Discharge Orders    None       Joanne Gavel, PA-C 10/15/17 0048    Tanna Furry, MD 10/18/17 2231

## 2017-10-14 NOTE — ED Notes (Signed)
Patient unable to follow commands for fluid challenge.

## 2017-10-15 DIAGNOSIS — R569 Unspecified convulsions: Secondary | ICD-10-CM | POA: Diagnosis not present

## 2017-10-15 LAB — BASIC METABOLIC PANEL
ANION GAP: 11 (ref 5–15)
BUN: 14 mg/dL (ref 6–20)
CALCIUM: 8.5 mg/dL — AB (ref 8.9–10.3)
CO2: 22 mmol/L (ref 22–32)
Chloride: 107 mmol/L (ref 101–111)
Creatinine, Ser: 0.66 mg/dL (ref 0.61–1.24)
GFR calc Af Amer: >60 mL/min (ref >60)
Glucose, Bld: 75 mg/dL (ref 65–99)
POTASSIUM: 3.7 mmol/L (ref 3.5–5.1)
SODIUM: 140 mmol/L (ref 135–145)

## 2017-10-15 LAB — CBC
HCT: 31 % — ABNORMAL LOW (ref 39.0–52.0)
Hemoglobin: 10.3 g/dL — ABNORMAL LOW (ref 13.0–17.0)
MCH: 30.9 pg (ref 26.0–34.0)
MCHC: 33.2 g/dL (ref 30.0–36.0)
MCV: 93.1 fL (ref 78.0–100.0)
PLATELETS: 415 10*3/uL — AB (ref 150–400)
RBC: 3.33 MIL/uL — AB (ref 4.22–5.81)
RDW: 16.5 % — ABNORMAL HIGH (ref 11.5–15.5)
WBC: 13.3 10*3/uL — AB (ref 4.0–10.5)

## 2017-10-15 LAB — MRSA PCR SCREENING: MRSA BY PCR: NEGATIVE

## 2017-10-15 MED ORDER — IPRATROPIUM-ALBUTEROL 0.5-2.5 (3) MG/3ML IN SOLN: 3.0000 mL | Freq: Four times a day (QID) | RESPIRATORY_TRACT | Status: DC | PRN

## 2017-10-15 MED ORDER — SODIUM CHLORIDE 0.9 % IV BOLUS
500.0000 mL | Freq: Once | INTRAVENOUS | Status: AC
  Administered 2017-10-15: 500 mL via INTRAVENOUS

## 2017-10-15 NOTE — Progress Notes (Signed)
Pt has not urinated since arrival on unit at 2300. Bladder scan performed revealing 268 mL of urine. NP on call, [place], notified. Order for bladder scan at 0900 and in and out cath if urine >300 mL obtained. Pt more alert and responsive this morning. Disoriented x4 but able to follow simple commands. Will continue to monitor.

## 2017-10-15 NOTE — Progress Notes (Signed)
CSW consulted to assist with disposition as pt admitted from facility- [facility], is long term care resident there. Pt admitted for seizures, also has pneumonia (was treated recently during hospitalization for this as well, returned to SNF with palliative care). Under observation currently. Has [employer] DSS legal guardian [place], [phone], [place] spoke with her this morning and she is aware of pt's status. Also spoke with Scientist, product/process development to update on pt's admission. Will follow during admission for needs and assist with coordinating return to [place] at [place].   See completed assessment from pt's recent hospitalization below. No notable changes in psychosocial situation.  Sharren Bridge, MSW, LCSW Clinical Social Work 10/15/2017 [phone]  Clinical Social Work Assessment  Patient Details  Name: Kevin Humphrey MRN: [id number] Date of Birth: 15-Sep-1950  Date of referral:  09/25/17               Reason for consult:  Facility Placement                 Permission sought to share information with:  Family Supports Permission granted to share information::                Name::                   Agency::  [facility]NF-[facility]              Relationship::  Legal Guardian              Contact Information:     Housing/Transportation Living arrangements for the past 2 months:  [place] of Information:  Facility Patient Interpreter Needed:  None Criminal Activity/Legal Involvement Pertinent to Current Situation/Hospitalization:  No - Comment as needed Significant Relationships:  Warehouse manager Lives with:  Facility Resident Do you feel safe going back to the place where you live?  Yes Need for family participation in patient care:  Yes (DSS is her legal guardian)  Care giving concerns:   No care giving concerns a this time.   Social Worker assessment / plan:  CSW spoke with the patient LG-Kevin Humphrey. Patient is a ward of state and currently  resides at [facility]. Patient will return to SNF at discharge. Kevin Humphrey request patient discharge summary be fax to:  [phone].  CSW spoke with patient nurse Maudie Mercury at [facility] for collateral information. She reports the patient is limited with all activities of daily living.She reports the patient can transfer to his wheel chair. She reports the patient yells out and can be agitated at times. The facility will accept patient at discharge.   CSW will assist with patient return to SNF and update LG at discharge.   Plan: SNF   Employment status:  Disabled (Comment on whether or not currently receiving Disability) Insurance information:  Managed Medicare PT Recommendations:  [place] / Referral to community resources:  [place]  Patient/Family's Response to care:  No family at bedside.   Patient/Family's Understanding of and Emotional Response to Diagnosis, Current Treatment, and Prognosis:  No family at bedside.   Emotional Assessment Appearance:  Appears stated age Attitude/Demeanor/Rapport:    Affect (typically observed):  Unable to Assess Orientation:  Oriented to Self Alcohol / Substance use:  Not Applicable Psych involvement (Current and /or in the community):  No (Comment)  Discharge Needs  Concerns to be addressed:  Discharge Planning Concerns Readmission within the last 30 days:  No Current discharge risk:  Dependent with Mobility, Psychiatric  Illness Barriers to Discharge:  Continued Medical Work up   [employer], LCSW 09/25/2017, 1:27 PM

## 2017-10-15 NOTE — Plan of Care (Signed)

## 2017-10-15 NOTE — Evaluation (Signed)
Clinical/Bedside Swallow Evaluation Patient Details  Name: Kevin Humphrey MRN: [id number] Date of Birth: 07-08-1950  Today's Date: 10/15/2017 Time: SLP Start Time (ACUTE ONLY): 1535 SLP Stop Time (ACUTE ONLY): 1545 SLP Time Calculation (min) (ACUTE ONLY): 10 min  Past Medical History:  Past Medical History:  Diagnosis Date  . Allergic rhinitis 08/02/2012  . Depression 08/02/2012  . Dysphagia, unspecified 08/02/2012  . GERD (gastroesophageal reflux disease) 08/02/2012  . Other and unspecified hyperlipidemia 08/02/2012  . Schizophrenia ([place]) 08/02/2012  . Vertebral compression fracture ([place]) 08/02/2012   Past Surgical History:  Past Surgical History:  Procedure Laterality Date  . EXPLORATORY LAPAROTOMY     ??? (Has large midline incision)  . LAPAROSCOPY N/A 09/22/2017   Procedure: LAPAROSCOPY DIAGNOSTIC  LYSIS OF ADHESIONS;  Surgeon: Michael Boston, MD;  Location: [facility];  Service: General;  Laterality: N/A;   HPI:  Patient is a 67 y.o. male with PMH: advanced dementia, schizophrenia, HTN, GERD, depression, dysphagia, who was brought to hospital from SNF secondary to persistent nausea with hematemesis. Chest x-ray at SNF showed some infiltrates and abdomen x-ray concering for bowel obstruction. Patient diagnosed at hospital with sepsis likely secondary to aspiration PNA, anemia, HTN, high-grade small bowel obstruction. on 6/1, he had laproscopy diagnositic lysis of adhesions.  Pt today much more alert with oral cavity clear.  Today seen to assess po tolerance and readiness for dietary advancement.     Assessment / Plan / Recommendation Clinical Impression  Very limited evaluation as SLP only able to get a single tsp of thin liquid into left side of oral cavity.  Lingual thrusting to left note ? c/w tardive dyskinesia.  Suspected delayed pharyngeal swallow with post-swallow overt coughing.  Pt with increased RR - 24 with accessory muscle use.  Recommend NPO with oral care as pt will allow.  Of note,  pt familiar to this SLP = evalauted during last hospital admission.  Before leaving hospital, pt was consuming a puree/thin diet with excellent tolerance - earlier in hospitalization he required Cortrak feedings.  Suspect pt's ability to consume po will wax/wane and adequate nutrition will be challenging.  SLP to follow up for po trials with hopeful plan to transition into po diet.   SLP Visit Diagnosis: Dysphagia, oropharyngeal phase (R13.12)    Aspiration Risk  Risk for inadequate nutrition/hydration;Severe aspiration risk    Diet Recommendation NPO   Postural Changes: Seated upright at 90 degrees;Remain upright for at least 30 minutes after po intake    Other  Recommendations Oral Care Recommendations: Oral care QID   Follow up Recommendations        Frequency and Duration min 2x/week  1 week       Prognosis Prognosis for Safe Diet Advancement: Guarded Barriers to Reach Goals: Severity of deficits;Time post onset      Swallow Study   General Date of Onset: 10/15/17 HPI: Patient is a 67 y.o. male with PMH: advanced dementia, schizophrenia, HTN, GERD, depression, dysphagia, who was brought to hospital from SNF secondary to persistent nausea with hematemesis. Chest x-ray at SNF showed some infiltrates and abdomen x-ray concering for bowel obstruction. Patient diagnosed at hospital with sepsis likely secondary to aspiration PNA, anemia, HTN, high-grade small bowel obstruction. on 6/1, he had laproscopy diagnositic lysis of adhesions.  Pt today much more alert with oral cavity clear.  Today seen to assess po tolerance and readiness for dietary advancement.   Type of Study: Bedside Swallow Evaluation Previous Swallow Assessment: MBS - dys1/thin -  Diet Prior to this Study: NPO Temperature Spikes Noted: No Oral Care Completed by SLP: No Oral Cavity - Dentition: Other (Comment);Edentulous Baseline Vocal Quality: Not observed Volitional Cough: Other (Comment) Volitional Swallow: Unable  to elicit    Oral/Motor/Sensory Function Overall Oral Motor/Sensory Function: Generalized oral weakness(lingual thrusting toward left noted - pt with head turn left)   Ice Chips Ice chips: Impaired Oral Phase Impairments: Reduced labial seal Other Comments: pt sealed lips    Thin Liquid Thin Liquid: Impaired Presentation: Spoon Pharyngeal  Phase Impairments: Cough - Immediate Other Comments: labial seal excessive = able to place bolus in left side of mouth - delayed swallow suspected follow by immediate cough    Nectar Thick Nectar Thick Liquid: Not tested   Honey Thick Honey Thick Liquid: Not tested   Puree Presentation: Spoon Other Comments: pt declined to open mouth to accept po trial with jello   Solid   GO   Solid: Not tested        Macario Golds 10/15/2017,4:37 PM  Luanna Salk, [facility] SLP [phone]

## 2017-10-15 NOTE — Progress Notes (Signed)
PROGRESS NOTE    Kevin Humphrey  ZOX:[id number] DOB: 01-31-1951 DOA: 10/14/2017 PCP: [name], MD    Brief Narrative:  67 y.o. male with medical history significant of dementia, schizophrenia, aspiration pneumonia, seizure disorder sent in from nursing home for at least 6 seizures that occurred today  Currently on IV antiseizure medications.  Assessment & Plan:   Principal Problem:   Convulsions/seizures ([place]) - continue IV vimpat and Keppra - stable currently. Exacerbated due to non compliance  Active Problems:   Schizophrenia ([place]) -  stable    Pneumonia - Plan will be to continue Levaquin    Anemia, chronic disease - stable    Dementia - most likely contributing to non compliance  DVT prophylaxis: SCD's Code Status: DNR Family Communication: None Disposition Plan: pending improvement in condition   Consultants:   Ethics committee given that patient has dementia and continues to be readmitted the question is whether treatment should continue vs placing patient on hospice path for comfort   Procedures: none   Antimicrobials: Levaquin   Subjective: Pt has no new complaints.   Objective: Vitals:   10/15/17 0030 10/15/17 0322 10/15/17 0623 10/15/17 1342  BP: (!) 161/78  (!) 161/72 (!) 175/84  Pulse:   88 87  Resp:   (!) 2 20  Temp:   98.4 F (36.9 C) 98.4 F (36.9 C)  TempSrc:   Oral Oral  SpO2:  96% 96% 95%  Weight:      Height:        Intake/Output Summary (Last 24 hours) at 10/15/2017 1411 Last data filed at 10/15/2017 0600 Gross per 24 hour  Intake 768.75 ml  Output -  Net 768.75 ml   Filed Weights   10/14/17 2318  Weight: 68.1 kg (150 lb 2.1 oz)    Examination:  General exam: Appears calm and comfortable, in nad.  Respiratory system: Clear to auscultation. Respiratory effort normal. Equal chest rise Cardiovascular system: S1 & S2 heard, RRR. No JVD, murmurs, rubs, gallops or clicks. No pedal edema. Gastrointestinal  system: Abdomen is nondistended, soft and nontender. No organomegaly or masses felt. Normal bowel sounds heard. Central nervous system: awake, dificult exam as patient is non verbal and does not interact with examiner. Extremities: warm, no cyanosis Skin: No rashes, lesions or ulcers, on limited exam. Psychiatry: unable to assess due to dementia and limited interaction with examiner.    Data Reviewed: I have personally reviewed following labs and imaging studies  CBC: Recent Labs  Lab 10/08/17 2055 10/09/17 0518 10/09/17 0553 10/10/17 0602 10/14/17 1834 10/15/17 0422  WBC 22.1* 16.6* 14.5* 11.8* 12.7* 13.3*  NEUTROABS 17.7*  --   --  7.1 9.7*  --   HGB 10.5* 9.4* 13.8 9.8* 10.0* 10.3*  HCT 31.7* 28.7* 43.1 29.8* 30.7* 31.0*  MCV 91.9 93.5 94.3 94.3 95.6 93.1  PLT 355 397 152 484* 437* 811*   Basic Metabolic Panel: Recent Labs  Lab 10/09/17 0518 10/09/17 0553 10/10/17 0602 10/14/17 1834 10/15/17 0422  NA 139 140 139 139 140  K 4.4 4.3 3.3* 3.8 3.7  CL 107 107 109 106 107  CO2 25 24 24 25 22   GLUCOSE 213* 90 90 86 75  BUN 18 19 19 15 14   CREATININE 1.05 1.09 0.68 0.69 0.66  CALCIUM 8.4* 8.8* 8.6* 8.5* 8.5*  MG  --   --  1.7 1.7  --   PHOS  --   --   --  2.1*  --  GFR: Estimated Creatinine Clearance: 85.1 mL/min (by C-G formula based on SCr of 0.66 mg/dL). Liver Function Tests: Recent Labs  Lab 10/08/17 2055 10/09/17 0553 10/14/17 1834  AST 57* 36 17  ALT 34 28 15*  ALKPHOS 74 66 44  BILITOT 1.7* 0.8 1.1  PROT 7.0 6.9 6.8  ALBUMIN 3.3* 3.1* 3.0*   No results for input(s): LIPASE, AMYLASE in the last 168 hours. No results for input(s): AMMONIA in the last 168 hours. Coagulation Profile: No results for input(s): INR, PROTIME in the last 168 hours. Cardiac Enzymes: No results for input(s): CKTOTAL, CKMB, CKMBINDEX, TROPONINI in the last 168 hours. BNP (last 3 results) No results for input(s): PROBNP in the last 8760 hours. HbA1C: No results for  input(s): HGBA1C in the last 72 hours. CBG: Recent Labs  Lab 10/09/17 1917 10/09/17 2341 10/10/17 0334 10/10/17 0830 10/10/17 1131  GLUCAP 95 93 95 95 104*   Lipid Profile: No results for input(s): CHOL, HDL, LDLCALC, TRIG, CHOLHDL, LDLDIRECT in the last 72 hours. Thyroid Function Tests: No results for input(s): TSH, T4TOTAL, FREET4, T3FREE, THYROIDAB in the last 72 hours. Anemia Panel: No results for input(s): VITAMINB12, FOLATE, FERRITIN, TIBC, IRON, RETICCTPCT in the last 72 hours. Sepsis Labs: Recent Labs  Lab 10/14/17 1834 10/14/17 1839  PROCALCITON <0.10  --   LATICACIDVEN  --  0.79    Recent Results (from the past 240 hour(s))  Culture, blood (Routine X 2) w Reflex to ID Panel     Status: None   Collection Time: 10/08/17  8:45 PM  Result Value Ref Range Status   Specimen Description BLOOD RIGHT ARM  Final   Special Requests   Final    BOTTLES DRAWN AEROBIC ONLY Blood Culture results may not be optimal due to an inadequate volume of blood received in culture bottles   Culture   Final    NO GROWTH 5 DAYS Performed at [facility], [place] [address], [place], [place] [address]    Report Status 10/13/2017 FINAL  Final  Culture, blood (Routine X 2) w Reflex to ID Panel     Status: None   Collection Time: 10/08/17  8:50 PM  Result Value Ref Range Status   Specimen Description BLOOD RIGHT ARM  Final   Special Requests   Final    BOTTLES DRAWN AEROBIC ONLY Blood Culture results may not be optimal due to an inadequate volume of blood received in culture bottles   Culture   Final    NO GROWTH 5 DAYS Performed at [facility], [place] [address] [place], [place] [address]    Report Status 10/13/2017 FINAL  Final  MRSA PCR Screening     Status: None   Collection Time: 10/15/17 10:47 AM  Result Value Ref Range Status   MRSA by PCR NEGATIVE NEGATIVE Final    Comment:        The GeneXpert MRSA Assay (FDA approved for NASAL specimens only), is one component of  a comprehensive MRSA colonization surveillance program. It is not intended to diagnose MRSA infection nor to guide or monitor treatment for MRSA infections. Performed at [facility], [place] [address] [place], [place] [address]          Radiology Studies: Dg Chest 2 View  Result Date: 10/14/2017 CLINICAL DATA:  Seizures. EXAM: CHEST - 2 VIEW COMPARISON:  10/08/2017 FINDINGS: The cardiac silhouette, mediastinal and hilar contours are within normal limits and stable for the AP projection. Improved aeration  since the prior study with probable clearing infiltrates. Chronic underlying lung disease is again demonstrated with fairly extensive basilar pulmonary scarring. No definite acute overlying pulmonary process. Numerous healed bilateral rib fractures. No obvious acute fractures. IMPRESSION: No definite acute pulmonary findings. Significant chronic lung disease. Numerous remote healed rib fractures.  No definite acute fractures. Electronically Signed   By: Marijo Sanes M.D.   On: 10/14/2017 17:00        Scheduled Meds: . chlorhexidine  15 mL Mouth Rinse BID  . Chlorhexidine Gluconate Cloth  6 each Topical Q0600  . feeding supplement (ENSURE ENLIVE)  237 mL Oral BID BM  . ferrous sulfate  325 mg Oral Daily  . guaiFENesin  400 mg Oral TID  . lisinopril  5 mg Oral Daily  . mouth rinse  15 mL Mouth Rinse q12n4p  . multivitamin with minerals  1 tablet Oral Daily  . mupirocin ointment  1 application Nasal BID  . [START ON 11/05/2017] Vitamin D (Ergocalciferol)  50,000 Units Oral Q30 days   Continuous Infusions: . sodium chloride 100 mL/hr at 10/15/17 1118  . lacosamide (VIMPAT) IV Stopped (10/15/17 0536)  . levETIRAcetam Stopped (10/15/17 8257)  . levofloxacin (LEVAQUIN) IV Stopped (10/15/17 0046)     LOS: 0 days    Time spent: 20 min   Velvet Bathe, MD [facility] Pager [phone]  If 7PM-7AM, please contact  night-coverage [email or web address] Password [facility] 10/15/2017, 2:11 PM

## 2017-10-16 DIAGNOSIS — J181 Lobar pneumonia, unspecified organism: Secondary | ICD-10-CM | POA: Diagnosis not present

## 2017-10-16 DIAGNOSIS — R1312 Dysphagia, oropharyngeal phase: Secondary | ICD-10-CM | POA: Diagnosis present

## 2017-10-16 DIAGNOSIS — G40A01 Absence epileptic syndrome, not intractable, with status epilepticus: Secondary | ICD-10-CM | POA: Diagnosis present

## 2017-10-16 DIAGNOSIS — E785 Hyperlipidemia, unspecified: Secondary | ICD-10-CM | POA: Diagnosis present

## 2017-10-16 DIAGNOSIS — K219 Gastro-esophageal reflux disease without esophagitis: Secondary | ICD-10-CM | POA: Diagnosis present

## 2017-10-16 DIAGNOSIS — F0391 Unspecified dementia with behavioral disturbance: Secondary | ICD-10-CM | POA: Diagnosis not present

## 2017-10-16 DIAGNOSIS — E8770 Fluid overload, unspecified: Secondary | ICD-10-CM | POA: Diagnosis not present

## 2017-10-16 DIAGNOSIS — Z9119 Patient's noncompliance with other medical treatment and regimen: Secondary | ICD-10-CM | POA: Diagnosis not present

## 2017-10-16 DIAGNOSIS — F209 Schizophrenia, unspecified: Secondary | ICD-10-CM | POA: Diagnosis present

## 2017-10-16 DIAGNOSIS — D638 Anemia in other chronic diseases classified elsewhere: Secondary | ICD-10-CM | POA: Diagnosis present

## 2017-10-16 DIAGNOSIS — F0390 Unspecified dementia without behavioral disturbance: Secondary | ICD-10-CM | POA: Diagnosis present

## 2017-10-16 DIAGNOSIS — Z66 Do not resuscitate: Secondary | ICD-10-CM | POA: Diagnosis not present

## 2017-10-16 DIAGNOSIS — Z515 Encounter for palliative care: Secondary | ICD-10-CM | POA: Diagnosis not present

## 2017-10-16 DIAGNOSIS — F200 Paranoid schizophrenia: Secondary | ICD-10-CM | POA: Diagnosis not present

## 2017-10-16 DIAGNOSIS — R4789 Other speech disturbances: Secondary | ICD-10-CM | POA: Diagnosis not present

## 2017-10-16 DIAGNOSIS — I10 Essential (primary) hypertension: Secondary | ICD-10-CM | POA: Diagnosis present

## 2017-10-16 DIAGNOSIS — E876 Hypokalemia: Secondary | ICD-10-CM | POA: Diagnosis not present

## 2017-10-16 DIAGNOSIS — R569 Unspecified convulsions: Secondary | ICD-10-CM | POA: Diagnosis present

## 2017-10-16 DIAGNOSIS — R1310 Dysphagia, unspecified: Secondary | ICD-10-CM | POA: Diagnosis present

## 2017-10-16 DIAGNOSIS — Z79899 Other long term (current) drug therapy: Secondary | ICD-10-CM | POA: Diagnosis not present

## 2017-10-16 DIAGNOSIS — Z87891 Personal history of nicotine dependence: Secondary | ICD-10-CM | POA: Diagnosis not present

## 2017-10-16 DIAGNOSIS — J690 Pneumonitis due to inhalation of food and vomit: Secondary | ICD-10-CM | POA: Diagnosis present

## 2017-10-16 LAB — URINE CULTURE: CULTURE: NO GROWTH

## 2017-10-16 MED ORDER — SODIUM CHLORIDE 0.9 % IV BOLUS
500.0000 mL | Freq: Once | INTRAVENOUS | Status: AC
  Administered 2017-10-16: 500 mL via INTRAVENOUS

## 2017-10-16 MED ORDER — ACETAMINOPHEN 650 MG RE SUPP
650.0000 mg | RECTAL | Status: DC | PRN
  Administered 2017-10-16 – 2017-10-17 (×2): 650 mg via RECTAL
  Filled 2017-10-16 (×2): qty 1

## 2017-10-16 MED ORDER — LORAZEPAM 2 MG/ML IJ SOLN
1.0000 mg | Freq: Once | INTRAMUSCULAR | Status: AC
  Administered 2017-10-16: 1 mg via INTRAVENOUS
  Filled 2017-10-16: qty 1

## 2017-10-16 NOTE — Progress Notes (Signed)
   10/16/17 1500  Clinical Encounter Type  Visited With Patient not available  Visit Type Initial;Psychological support;Spiritual support  Referral From Nurse  Consult/Referral To Chaplain  Spiritual Encounters  Spiritual Needs Emotional;Other (Comment) (Spiritual Care Conversation/Support)  Stress Factors  Patient Stress Factors Not reviewed   Patient not able to communicate.   Chaplain Shanon Ace M.Div., [facility]

## 2017-10-16 NOTE — Progress Notes (Signed)
RN got call from tele stating pt heart rate is sustaining 130s-140s. RN Text paged MD. Awaiting MD reply at this time.

## 2017-10-16 NOTE — Progress Notes (Signed)
  Speech Language Pathology Treatment: Dysphagia  Patient Details Name: Kevin Humphrey MRN: [id number] DOB: [birth year] Today's Date: 10/16/2017 Time: 4536-4680 SLP Time Calculation (min) (ACUTE ONLY): 10 min  Assessment / Plan / Recommendation Clinical Impression  Pt RR remains elevated - 27 - and shallow; Pt unable to verbalize needs and is not phonating today, he can not concentrate on task and would not open mouth per cues.   Oral cavity able to be observed with flashlight appeared clear except minimal white coating on tongue.  SlP able to place very small amount of water into mouth via tip of straw - delayed swallow noted but no indication of aspiration. Pt is NOT ready for po intake due to unwillingness to accept po intake, suspected pharyngeal dysphagia, and decreased sustained attention.   Concern present for him being able to consume intake given continued poor performance, note he is to be followed up by ethics commitee per MD notes.    HPI HPI: Patient is a 67 y.o. male with PMH: advanced dementia, schizophrenia, HTN, GERD, depression, dysphagia, who was brought to hospital from SNF secondary to persistent nausea with hematemesis. Chest x-ray at SNF showed some infiltrates and abdomen x-ray concering for bowel obstruction. Patient diagnosed at hospital with sepsis likely secondary to aspiration PNA, anemia, HTN, high-grade small bowel obstruction. on 6/1, he had laproscopy diagnositic lysis of adhesions.  Pt today much more alert with oral cavity clear.  Today seen to assess po tolerance and readiness for dietary advancement.        SLP Plan  Continue with current plan of care       Recommendations  Diet recommendations: NPO                Oral Care Recommendations: Oral care BID;Other (Comment)(as pt will allow) Follow up Recommendations: None SLP Visit Diagnosis: Dysphagia, oropharyngeal phase (R13.12) Plan: Continue with current plan of care       GO                 Macario Golds 10/16/2017, 5:40 PM Luanna Salk, [place] [facility] SLP [phone]

## 2017-10-16 NOTE — Progress Notes (Signed)
PROGRESS NOTE    Kevin Humphrey  TIR:[id number] DOB: [birth year] DOA: 10/14/2017 PCP: [name], MD   Brief Narrative:  67 y.o. male with medical history significant of dementia, schizophrenia, aspiration pneumonia, seizure disorder sent in from nursing home for at least 6 seizures that occurred today  Currently on IV antiseizure medications.  Assessment & Plan:   Principal Problem:   Convulsions/seizures ([place]) - Consulted Ethics committee for disposition as I suspect that medical treatment will not improve quality of life and recommend that a comfort care approach for this patient is most appropriate given re hospitalizations for refusal of treatments in patient with advanced dementia.  Active Problems:   Schizophrenia ([place]) -  stable    Pneumonia - Plan will be to continue Levaquin    Anemia, chronic disease - stable    Dementia - most likely contributing to non compliance  DVT prophylaxis: SCD's Code Status: DNR Family Communication: None Disposition Plan: pending improvement in condition   Consultants:   Ethics committee given that patient has dementia and continues to be readmitted the question is whether treatment should continue vs placing patient on hospice path for comfort   Procedures: none   Antimicrobials: Levaquin   Subjective: Nursing called about tachycardia which resolved with fluid bolus and rest.  Objective: Vitals:   10/15/17 0322 10/15/17 0623 10/15/17 1342 10/15/17 1954  BP:  (!) 161/72 (!) 175/84 (!) 159/73  Pulse:  88 87 83  Resp:  (!) 2 20 20   Temp:  98.4 F (36.9 C) 98.4 F (36.9 C) 98.7 F (37.1 C)  TempSrc:  Oral Oral Oral  SpO2: 96% 96% 95% 94%  Weight:      Height:        Intake/Output Summary (Last 24 hours) at 10/16/2017 1437 Last data filed at 10/16/2017 0600 Gross per 24 hour  Intake 3911.83 ml  Output 200 ml  Net 3711.83 ml   Filed Weights   10/14/17 2318  Weight: 68.1 kg (150 lb 2.1 oz)     Examination:  General exam: Appears calm and comfortable, in nad.  Respiratory system: Clear to auscultation. Respiratory effort normal. Equal chest rise Cardiovascular system: S1 & S2 heard, RRR. No JVD, murmurs, rubs, gallops or clicks. No pedal edema. Gastrointestinal system: Abdomen is nondistended, soft and nontender. No organomegaly or masses felt. Normal bowel sounds heard. Central nervous system: awake, dificult exam as patient is non verbal and does not interact with examiner. Extremities: warm, no cyanosis Skin: No rashes, lesions or ulcers, on limited exam. Psychiatry: unable to assess due to dementia and limited interaction with examiner.    Data Reviewed: I have personally reviewed following labs and imaging studies  CBC: Recent Labs  Lab 10/10/17 0602 10/14/17 1834 10/15/17 0422  WBC 11.8* 12.7* 13.3*  NEUTROABS 7.1 9.7*  --   HGB 9.8* 10.0* 10.3*  HCT 29.8* 30.7* 31.0*  MCV 94.3 95.6 93.1  PLT 484* 437* 676*   Basic Metabolic Panel: Recent Labs  Lab 10/10/17 0602 10/14/17 1834 10/15/17 0422  NA 139 139 140  K 3.3* 3.8 3.7  CL 109 106 107  CO2 24 25 22   GLUCOSE 90 86 75  BUN 19 15 14   CREATININE 0.68 0.69 0.66  CALCIUM 8.6* 8.5* 8.5*  MG 1.7 1.7  --   PHOS  --  2.1*  --    GFR: Estimated Creatinine Clearance: 85.1 mL/min (by C-G formula based on SCr of 0.66 mg/dL). Liver Function Tests: Recent Labs  Lab  10/14/17 1834  AST 17  ALT 15*  ALKPHOS 44  BILITOT 1.1  PROT 6.8  ALBUMIN 3.0*   No results for input(s): LIPASE, AMYLASE in the last 168 hours. No results for input(s): AMMONIA in the last 168 hours. Coagulation Profile: No results for input(s): INR, PROTIME in the last 168 hours. Cardiac Enzymes: No results for input(s): CKTOTAL, CKMB, CKMBINDEX, TROPONINI in the last 168 hours. BNP (last 3 results) No results for input(s): PROBNP in the last 8760 hours. HbA1C: No results for input(s): HGBA1C in the last 72 hours. CBG: Recent  Labs  Lab 10/09/17 1917 10/09/17 2341 10/10/17 0334 10/10/17 0830 10/10/17 1131  GLUCAP 95 93 95 95 104*   Lipid Profile: No results for input(s): CHOL, HDL, LDLCALC, TRIG, CHOLHDL, LDLDIRECT in the last 72 hours. Thyroid Function Tests: No results for input(s): TSH, T4TOTAL, FREET4, T3FREE, THYROIDAB in the last 72 hours. Anemia Panel: No results for input(s): VITAMINB12, FOLATE, FERRITIN, TIBC, IRON, RETICCTPCT in the last 72 hours. Sepsis Labs: Recent Labs  Lab 10/14/17 1834 10/14/17 1839  PROCALCITON <0.10  --   LATICACIDVEN  --  0.79    Recent Results (from the past 240 hour(s))  Culture, blood (Routine X 2) w Reflex to ID Panel     Status: None   Collection Time: 10/08/17  8:45 PM  Result Value Ref Range Status   Specimen Description BLOOD RIGHT ARM  Final   Special Requests   Final    BOTTLES DRAWN AEROBIC ONLY Blood Culture results may not be optimal due to an inadequate volume of blood received in culture bottles   Culture   Final    NO GROWTH 5 DAYS Performed at [facility], [place] [address], [place], [place] [address]    Report Status 10/13/2017 FINAL  Final  Culture, blood (Routine X 2) w Reflex to ID Panel     Status: None   Collection Time: 10/08/17  8:50 PM  Result Value Ref Range Status   Specimen Description BLOOD RIGHT ARM  Final   Special Requests   Final    BOTTLES DRAWN AEROBIC ONLY Blood Culture results may not be optimal due to an inadequate volume of blood received in culture bottles   Culture   Final    NO GROWTH 5 DAYS Performed at [facility], [place] [address] [place], [place] [address]    Report Status 10/13/2017 FINAL  Final  Urine culture     Status: None   Collection Time: 10/14/17  8:18 PM  Result Value Ref Range Status   Specimen Description   Final    URINE, RANDOM Performed at [place] [address] [place], [place] [address]    Special Requests   Final    NONE Performed at [facility], [place] [address] [place], [place] [address]    Culture   Final    NO GROWTH Performed at [facility], [place] [address] [place], [place] [address]    Report Status 10/16/2017 FINAL  Final  MRSA PCR Screening     Status: None   Collection Time: 10/15/17 10:47 AM  Result Value Ref Range Status   MRSA by PCR NEGATIVE NEGATIVE Final    Comment:        The GeneXpert MRSA Assay (FDA approved for NASAL specimens only), is one component of a comprehensive MRSA colonization surveillance program. It is not intended to diagnose MRSA infection nor to guide or  monitor treatment for MRSA infections. Performed at [facility], [place] [address], [place], [place] [address]          Radiology Studies: Dg Chest 2 View  Result Date: 10/14/2017 CLINICAL DATA:  Seizures. EXAM: CHEST - 2 VIEW COMPARISON:  10/08/2017 FINDINGS: The cardiac silhouette, mediastinal and hilar contours are within normal limits and stable for the AP projection. Improved aeration since the prior study with probable clearing infiltrates. Chronic underlying lung disease is again demonstrated with fairly extensive basilar pulmonary scarring. No definite acute overlying pulmonary process. Numerous healed bilateral rib fractures. No obvious acute fractures. IMPRESSION: No definite acute pulmonary findings. Significant chronic lung disease. Numerous remote healed rib fractures.  No definite acute fractures. Electronically Signed   By: Marijo Sanes M.D.   On: 10/14/2017 17:00        Scheduled Meds: . chlorhexidine  15 mL Mouth Rinse BID  . Chlorhexidine Gluconate Cloth  6 each Topical Q0600  . feeding supplement (ENSURE ENLIVE)  237 mL Oral BID BM  . ferrous sulfate  325 mg Oral Daily  . guaiFENesin  400 mg Oral TID  . lisinopril  5 mg Oral Daily  . mouth rinse  15 mL Mouth Rinse q12n4p  . multivitamin with minerals  1 tablet Oral Daily  . mupirocin ointment  1 application Nasal  BID  . [START ON 11/05/2017] Vitamin D (Ergocalciferol)  50,000 Units Oral Q30 days   Continuous Infusions: . sodium chloride 100 mL/hr at 10/16/17 0225  . lacosamide (VIMPAT) IV Stopped (10/16/17 0530)  . levETIRAcetam Stopped (10/16/17 0603)  . levofloxacin (LEVAQUIN) IV Stopped (10/15/17 2209)     LOS: 0 days   Time spent: 20 min  Velvet Bathe, MD [facility] Pager [phone]  If 7PM-7AM, please contact night-coverage [email or web address] Password [facility] 10/16/2017, 2:37 PM

## 2017-10-16 NOTE — Progress Notes (Signed)
RN paged MD regarding pt flagging for SIRS. MD called RN back and given order for Tylenol suppository for temp of 101.4 axillary. Md states that pt is on ABX for sirs already.

## 2017-10-17 LAB — PROCALCITONIN: PROCALCITONIN: 0.27 ng/mL

## 2017-10-17 MED ORDER — PIPERACILLIN-TAZOBACTAM 3.375 G IVPB
3.3750 g | Freq: Three times a day (TID) | INTRAVENOUS | Status: DC
  Administered 2017-10-17 – 2017-10-23 (×18): 3.375 g via INTRAVENOUS
  Filled 2017-10-17 (×20): qty 50

## 2017-10-17 MED ORDER — LABETALOL HCL 5 MG/ML IV SOLN
5.0000 mg | Freq: Three times a day (TID) | INTRAVENOUS | Status: DC
  Administered 2017-10-17 – 2017-10-19 (×5): 5 mg via INTRAVENOUS
  Filled 2017-10-17 (×7): qty 4

## 2017-10-17 MED ORDER — DEXTROSE-NACL 5-0.45 % IV SOLN
INTRAVENOUS | Status: DC
  Administered 2017-10-17 – 2017-10-19 (×4): via INTRAVENOUS

## 2017-10-17 NOTE — Progress Notes (Signed)
Pharmacy Antibiotic Note  Kevin Humphrey is a 67 y.o. male admitted on 10/14/2017 with aspiration PNA.  Pharmacy has been consulted for Zosyn dosing.  Plan: 1) Zosyn 3.375g IV q8 (extended interval infusion) 2) Would consider changing to Unasyn as pseudomonas is not likely with aspiration 3) Will sign off  Height: 5\' 11"  (180.3 cm) Weight: 150 lb 2.1 oz (68.1 kg) IBW/kg (Calculated) : 75.3  Temp (24hrs), Avg:99.9 F (37.7 C), Min:98.7 F (37.1 C), Max:101.4 F (38.6 C)  Recent Labs  Lab 10/14/17 1834 10/14/17 1839 10/15/17 0422  WBC 12.7*  --  13.3*  CREATININE 0.69  --  0.66  LATICACIDVEN  --  0.79  --     Estimated Creatinine Clearance: 85.1 mL/min (by C-G formula based on SCr of 0.66 mg/dL).    No Known Allergies   Thank you for allowing pharmacy to be a part of this patient's care.  Kara Mead 10/17/2017 11:14 AM

## 2017-10-17 NOTE — Progress Notes (Addendum)
PROGRESS NOTE  Kevin Humphrey WLS:[id number] DOB: 07/18/1950 DOA: 10/14/2017 PCP: [name], MD  HPI/Recap of past 24 hours: Kevin Humphrey is a 67 year old male with past medical history significant for dementia, schizophrenia, aspiration pneumonia, seizure disorder who presented from SNF due to multiple seizures that occurred on 10/14/2017.  Started on IV antiseizure medication.  10/17/2017: Patient seen and examined at his bedside.  He is somnolent and appears uncomfortable.  Unable to obtain history due to altered mental status.  Assessment/Plan: Principal Problem:   Convulsions/seizures ([place]) Active Problems:   Schizophrenia ([place])   Pneumonia   Anemia, chronic disease   Seizure (HCC)   Dementia  Acute metabolic encephalopathy most likely multifactorial secondary to suspected aspiration pneumonia versus others CT head no contrast unremarkable for any acute intracranial findings Reorient as needed Unclear what his baseline is Fall precautions  Suspected aspiration pneumonia Start IV Zosyn Obtain sputum culture Personally reviewed chest x-ray done on 10/14/2017 which revealed right middle lobe and right lower lobe infiltrates. Speech therapist recommends n.p.o. Continue IV medications  Severe dysphagia Speech therapist evaluated and recommended n.p.o. Start D5 half-normal saline at 75 cc/h to avoid hypoglycemia  Seizure disorder Monitor closely for any recurrences If recurrent will transfer the patient to [place] c/w IV Vimpat Continue PRN IV Ativan  Accelerated hypertension Start IV labetalol at 5 mg every 8 hours Continue to closely monitor vital signs  Chronic normocytic anemia Hemoglobin 10.3 at baseline No sign of overt bleeding Repeat CBC in the morning    Code Status: DNR  Family Communication: None at bedside  Disposition Plan: SNF when clinically  stable   Consultants:  None  Procedures:  None  Antimicrobials:  IV zosyn  DVT prophylaxis: SCDs   Objective: Vitals:   10/16/17 1800 10/16/17 2007 10/17/17 0507 10/17/17 1232  BP:  (!) 152/73 (!) 173/98 (!) 168/90  Pulse:  76 (!) 119 94  Resp:  16 (!) 22 20  Temp: 99 F (37.2 C) 98.7 F (37.1 C) 100.3 F (37.9 C) 97.6 F (36.4 C)  TempSrc: Oral Oral  Oral  SpO2:  100% 95% 100%  Weight:      Height:        Intake/Output Summary (Last 24 hours) at 10/17/2017 1745 Last data filed at 10/17/2017 1500 Gross per 24 hour  Intake 2237.21 ml  Output 1500 ml  Net 737.21 ml   Filed Weights   10/14/17 2318  Weight: 68.1 kg (150 lb 2.1 oz)    Exam:  . General: 67 y.o. year-old male well developed well nourished appears uncomfortable in the bed.  Not interactive.  .  Cardiovascular: Regular rate and rhythm with no rubs or gallops.  No thyromegaly or JVD noted.   Marland Kitchen Respiratory: Diffuse rhonchi with no wheezes.  Poor inspiratory effort.   . Abdomen: Soft nontender nondistended with hypo-active bowel sounds x4 quadrant.   . Musculoskeletal: No lower extremity edema.  Mild right upper extremity edema. Marland Kitchen Psychiatry: Unable to assess mood due to altered mental status.   Data Reviewed: CBC: Recent Labs  Lab 10/14/17 1834 10/15/17 0422  WBC 12.7* 13.3*  NEUTROABS 9.7*  --   HGB 10.0* 10.3*  HCT 30.7* 31.0*  MCV 95.6 93.1  PLT 437* 811*   Basic Metabolic Panel: Recent Labs  Lab 10/14/17 1834 10/15/17 0422  NA 139 140  K 3.8 3.7  CL 106 107  CO2 25 22  GLUCOSE 86 75  BUN 15 14  CREATININE 0.69 0.66  CALCIUM 8.5* 8.5*  MG 1.7  --   PHOS 2.1*  --    GFR: Estimated Creatinine Clearance: 85.1 mL/min (by C-G formula based on SCr of 0.66 mg/dL). Liver Function Tests: Recent Labs  Lab 10/14/17 1834  AST 17  ALT 15*  ALKPHOS 44  BILITOT 1.1  PROT 6.8  ALBUMIN 3.0*   No results for input(s): LIPASE, AMYLASE in the last 168 hours. No results for  input(s): AMMONIA in the last 168 hours. Coagulation Profile: No results for input(s): INR, PROTIME in the last 168 hours. Cardiac Enzymes: No results for input(s): CKTOTAL, CKMB, CKMBINDEX, TROPONINI in the last 168 hours. BNP (last 3 results) No results for input(s): PROBNP in the last 8760 hours. HbA1C: No results for input(s): HGBA1C in the last 72 hours. CBG: No results for input(s): GLUCAP in the last 168 hours. Lipid Profile: No results for input(s): CHOL, HDL, LDLCALC, TRIG, CHOLHDL, LDLDIRECT in the last 72 hours. Thyroid Function Tests: No results for input(s): TSH, T4TOTAL, FREET4, T3FREE, THYROIDAB in the last 72 hours. Anemia Panel: No results for input(s): VITAMINB12, FOLATE, FERRITIN, TIBC, IRON, RETICCTPCT in the last 72 hours. Urine analysis:    Component Value Date/Time   COLORURINE YELLOW 10/14/2017 [place] 10/14/2017 1650   LABSPEC 1.020 10/14/2017 1650   PHURINE 5.0 10/14/2017 1650   GLUCOSEU 50 (A) 10/14/2017 1650   HGBUR MODERATE (A) 10/14/2017 1650   BILIRUBINUR NEGATIVE 10/14/2017 1650   KETONESUR 5 (A) 10/14/2017 1650   PROTEINUR NEGATIVE 10/14/2017 1650   UROBILINOGEN 0.2 12/20/2014 1157   NITRITE NEGATIVE 10/14/2017 1650   LEUKOCYTESUR NEGATIVE 10/14/2017 1650   Sepsis Labs: @LABRCNTIP (procalcitonin:4,lacticidven:4)  ) Recent Results (from the past 240 hour(s))  Culture, blood (Routine X 2) w Reflex to ID Panel     Status: None   Collection Time: 10/08/17  8:45 PM  Result Value Ref Range Status   Specimen Description BLOOD RIGHT ARM  Final   Special Requests   Final    BOTTLES DRAWN AEROBIC ONLY Blood Culture results may not be optimal due to an inadequate volume of blood received in culture bottles   Culture   Final    NO GROWTH 5 DAYS Performed at [facility], [place] [address] [place], [place] [address]    Report Status 10/13/2017 FINAL  Final  Culture, blood (Routine X 2) w Reflex to ID Panel     Status: None    Collection Time: 10/08/17  8:50 PM  Result Value Ref Range Status   Specimen Description BLOOD RIGHT ARM  Final   Special Requests   Final    BOTTLES DRAWN AEROBIC ONLY Blood Culture results may not be optimal due to an inadequate volume of blood received in culture bottles   Culture   Final    NO GROWTH 5 DAYS Performed at [facility], [place] [address] [place], [place] [address]    Report Status 10/13/2017 FINAL  Final  Urine culture     Status: None   Collection Time: 10/14/17  8:18 PM  Result Value Ref Range Status   Specimen Description   Final    URINE, RANDOM Performed at [place] [address] [place], [place] [address]    Special Requests   Final    NONE Performed at [facility], [place] [address] [place], [place] [address]    Culture   Final    NO GROWTH Performed  at [facility], [place] [address], [place], [place] [address]    Report Status 10/16/2017 FINAL  Final  MRSA PCR Screening     Status: None   Collection Time: 10/15/17 10:47 AM  Result Value Ref Range Status   MRSA by PCR NEGATIVE NEGATIVE Final    Comment:        The GeneXpert MRSA Assay (FDA approved for NASAL specimens only), is one component of a comprehensive MRSA colonization surveillance program. It is not intended to diagnose MRSA infection nor to guide or monitor treatment for MRSA infections. Performed at [facility], [place] [address], [place], [place] [address]       Studies: No results found.  Scheduled Meds: . chlorhexidine  15 mL Mouth Rinse BID  . Chlorhexidine Gluconate Cloth  6 each Topical Q0600  . feeding supplement (ENSURE ENLIVE)  237 mL Oral BID BM  . ferrous sulfate  325 mg Oral Daily  . guaiFENesin  400 mg Oral TID  . lisinopril  5 mg Oral Daily  . mouth rinse  15 mL Mouth Rinse q12n4p  . multivitamin with minerals  1 tablet Oral Daily  . mupirocin ointment  1 application Nasal BID  . [START ON  11/05/2017] Vitamin D (Ergocalciferol)  50,000 Units Oral Q30 days    Continuous Infusions: . sodium chloride 100 mL/hr at 10/16/17 2317  . lacosamide (VIMPAT) IV Stopped (10/17/17 0553)  . levETIRAcetam Stopped (10/17/17 0519)  . piperacillin-tazobactam (ZOSYN)  IV Stopped (10/17/17 1540)     LOS: 1 day     Kayleen Memos, MD [facility] Pager [phone]  If 7PM-7AM, please contact night-coverage [email or web address] Password [facility] 10/17/2017, 5:45 PM

## 2017-10-17 NOTE — Plan of Care (Signed)

## 2017-10-18 LAB — CBC
HEMATOCRIT: 29.2 % — AB (ref 39.0–52.0)
HEMOGLOBIN: 9.4 g/dL — AB (ref 13.0–17.0)
MCH: 30.5 pg (ref 26.0–34.0)
MCHC: 32.2 g/dL (ref 30.0–36.0)
MCV: 94.8 fL (ref 78.0–100.0)
Platelets: 204 10*3/uL (ref 150–400)
RBC: 3.08 MIL/uL — ABNORMAL LOW (ref 4.22–5.81)
RDW: 15.9 % — ABNORMAL HIGH (ref 11.5–15.5)
WBC: 4.1 10*3/uL (ref 4.0–10.5)

## 2017-10-18 LAB — COMPREHENSIVE METABOLIC PANEL
ALK PHOS: 42 U/L (ref 38–126)
ALT: 16 U/L (ref 0–44)
ANION GAP: 8 (ref 5–15)
AST: 24 U/L (ref 15–41)
Albumin: 2.6 g/dL — ABNORMAL LOW (ref 3.5–5.0)
BILIRUBIN TOTAL: 0.7 mg/dL (ref 0.3–1.2)
BUN: 6 mg/dL — ABNORMAL LOW (ref 8–23)
CALCIUM: 8.3 mg/dL — AB (ref 8.9–10.3)
CO2: 24 mmol/L (ref 22–32)
Chloride: 107 mmol/L (ref 98–111)
Creatinine, Ser: 0.66 mg/dL (ref 0.61–1.24)
GFR calc non Af Amer: >60 mL/min (ref >60)
Glucose, Bld: 100 mg/dL — ABNORMAL HIGH (ref 70–99)
Potassium: 3.2 mmol/L — ABNORMAL LOW (ref 3.5–5.1)
SODIUM: 139 mmol/L (ref 135–145)
TOTAL PROTEIN: 6.2 g/dL — AB (ref 6.5–8.1)

## 2017-10-18 NOTE — Progress Notes (Signed)
PROGRESS NOTE  Kevin Humphrey IZT:[id number] DOB: February 08, 1951 DOA: 10/14/2017 PCP: [name], MD  HPI/Recap of past 24 hours: [name] is a 67 year old male with past medical history significant for dementia, schizophrenia, aspiration pneumonia, seizure disorder who presented from SNF due to multiple seizures that occurred on 10/14/2017.  Started on IV antiseizure medication.  10/17/2017: Patient seen and examined at his bedside.  He is somnolent and appears uncomfortable.  Unable to obtain history due to altered mental status.  10/18/2017: Seen and examined at bedside not interactive however does not appear to be in distress.  Called legal guardian [facility] and left a message.  Will attempt to call again tomorrow.  Assessment/Plan: Principal Problem:   Convulsions/seizures ([place]) Active Problems:   Schizophrenia ([place])   Pneumonia   Anemia, chronic disease   Seizure ([place])   Dementia  Acute metabolic encephalopathy most likely multifactorial secondary to suspected aspiration pneumonia versus others CT head no contrast unremarkable for any acute intracranial findings Reorient as needed Unclear what his baseline is Fall precautions Called legal guardian with no response will attempt again tomorrow 10/19/2017  Suspected aspiration pneumonia Continue IV Zosyn Obtain sputum culture Personally reviewed chest x-ray done on 10/14/2017 which revealed right middle lobe and right lower lobe infiltrates. Speech therapist recommends n.p.o.  Severe dysphagia Speech therapist evaluated and recommended n.p.o. Continue D5 half-normal saline at 75 cc/h to avoid hypoglycemia  Seizure disorder Monitor closely for any recurrences If recurrent will transfer the patient to [place] c/w IV Vimpat Continue PRN IV Ativan  Accelerated hypertension, improving Continue IV labetalol at 5 mg every 8 hours Continue to closely monitor vital signs  Chronic  normocytic anemia Hemoglobin 10.3 at baseline No sign of overt bleeding Repeat CBC in the morning    Code Status: DNR  Family Communication: Called legal guardian on 10/18/2017 and left a message.  Will call again tomorrow  Disposition Plan: SNF when clinically stable   Consultants:  None  Procedures:  None  Antimicrobials:  IV zosyn  DVT prophylaxis: SCDs; recent GI bleed   Objective: Vitals:   10/18/17 0436 10/18/17 1000 10/18/17 1100 10/18/17 1532  BP: 134/84 (!) 181/95 (!) 153/69 (!) 145/93  Pulse: 79 82 75 85  Resp: 18     Temp: 98.2 F (36.8 C)     TempSrc:      SpO2:      Weight:      Height:        Intake/Output Summary (Last 24 hours) at 10/18/2017 1605 Last data filed at 10/18/2017 1400 Gross per 24 hour  Intake 2649.63 ml  Output 200 ml  Net 2449.63 ml   Filed Weights   10/14/17 2318  Weight: 68.1 kg (150 lb 2.1 oz)    Exam:  . General: 67 y.o. year-old male well-developed well-nourished in no acute distress.  Not interactive. . Cardiovascular: Regular rate and rhythm with no rubs or gallops.  No thyromegaly or JVD noted.  Respiratory: Diffuse rhonchi with no wheezes.  Poor inspiratory effort.   . Abdomen: Soft nontender nondistended with hypo-active bowel sounds x4 quadrant.   . Musculoskeletal: No lower extremity edema.  Mild right upper extremity edema. Marland Kitchen Psychiatry: Unable to assess mood due to altered mental status.   Data Reviewed: CBC: Recent Labs  Lab 10/14/17 1834 10/15/17 0422 10/18/17 0654  WBC 12.7* 13.3* 4.1  NEUTROABS 9.7*  --   --   HGB 10.0* 10.3* 9.4*  HCT 30.7*  31.0* 29.2*  MCV 95.6 93.1 94.8  PLT 437* 415* 950   Basic Metabolic Panel: Recent Labs  Lab 10/14/17 1834 10/15/17 0422 10/18/17 0654  NA 139 140 139  K 3.8 3.7 3.2*  CL 106 107 107  CO2 25 22 24   GLUCOSE 86 75 100*  BUN 15 14 6*  CREATININE 0.69 0.66 0.66  CALCIUM 8.5* 8.5* 8.3*  MG 1.7  --   --   PHOS 2.1*  --   --    GFR: Estimated  Creatinine Clearance: 85.1 mL/min (by C-G formula based on SCr of 0.66 mg/dL). Liver Function Tests: Recent Labs  Lab 10/14/17 1834 10/18/17 0654  AST 17 24  ALT 15* 16  ALKPHOS 44 42  BILITOT 1.1 0.7  PROT 6.8 6.2*  ALBUMIN 3.0* 2.6*   No results for input(s): LIPASE, AMYLASE in the last 168 hours. No results for input(s): AMMONIA in the last 168 hours. Coagulation Profile: No results for input(s): INR, PROTIME in the last 168 hours. Cardiac Enzymes: No results for input(s): CKTOTAL, CKMB, CKMBINDEX, TROPONINI in the last 168 hours. BNP (last 3 results) No results for input(s): PROBNP in the last 8760 hours. HbA1C: No results for input(s): HGBA1C in the last 72 hours. CBG: No results for input(s): GLUCAP in the last 168 hours. Lipid Profile: No results for input(s): CHOL, HDL, LDLCALC, TRIG, CHOLHDL, LDLDIRECT in the last 72 hours. Thyroid Function Tests: No results for input(s): TSH, T4TOTAL, FREET4, T3FREE, THYROIDAB in the last 72 hours. Anemia Panel: No results for input(s): VITAMINB12, FOLATE, FERRITIN, TIBC, IRON, RETICCTPCT in the last 72 hours. Urine analysis:    Component Value Date/Time   COLORURINE YELLOW 10/14/2017 [place] 10/14/2017 1650   LABSPEC 1.020 10/14/2017 1650   PHURINE 5.0 10/14/2017 1650   GLUCOSEU 50 (A) 10/14/2017 1650   HGBUR MODERATE (A) 10/14/2017 1650   BILIRUBINUR NEGATIVE 10/14/2017 1650   KETONESUR 5 (A) 10/14/2017 1650   PROTEINUR NEGATIVE 10/14/2017 1650   UROBILINOGEN 0.2 12/20/2014 1157   NITRITE NEGATIVE 10/14/2017 1650   LEUKOCYTESUR NEGATIVE 10/14/2017 1650   Sepsis Labs: @LABRCNTIP (procalcitonin:4,lacticidven:4)  ) Recent Results (from the past 240 hour(s))  Culture, blood (Routine X 2) w Reflex to ID Panel     Status: None   Collection Time: 10/08/17  8:45 PM  Result Value Ref Range Status   Specimen Description BLOOD RIGHT ARM  Final   Special Requests   Final    BOTTLES DRAWN AEROBIC ONLY Blood  Culture results may not be optimal due to an inadequate volume of blood received in culture bottles   Culture   Final    NO GROWTH 5 DAYS Performed at [facility], [place] [address], [place], [place] [address]    Report Status 10/13/2017 FINAL  Final  Culture, blood (Routine X 2) w Reflex to ID Panel     Status: None   Collection Time: 10/08/17  8:50 PM  Result Value Ref Range Status   Specimen Description BLOOD RIGHT ARM  Final   Special Requests   Final    BOTTLES DRAWN AEROBIC ONLY Blood Culture results may not be optimal due to an inadequate volume of blood received in culture bottles   Culture   Final    NO GROWTH 5 DAYS Performed at [facility], [place] [address], [place], [place] [address]    Report Status 10/13/2017 FINAL  Final  Urine culture     Status: None   Collection  Time: 10/14/17  8:18 PM  Result Value Ref Range Status   Specimen Description   Final    URINE, RANDOM Performed at [place] [address], [place], [place] [address]    Special Requests   Final    NONE Performed at [facility], [place] [address] [place], [place] [address]    Culture   Final    NO GROWTH Performed at [facility], [place] [address], [place], [place] [address]    Report Status 10/16/2017 FINAL  Final  MRSA PCR Screening     Status: None   Collection Time: 10/15/17 10:47 AM  Result Value Ref Range Status   MRSA by PCR NEGATIVE NEGATIVE Final    Comment:        The GeneXpert MRSA Assay (FDA approved for NASAL specimens only), is one component of a comprehensive MRSA colonization surveillance program. It is not intended to diagnose MRSA infection nor to guide or monitor treatment for MRSA infections. Performed at [facility], [place] [address], [place], [place] [address]       Studies: No results found.  Scheduled Meds: . chlorhexidine  15 mL Mouth Rinse BID  . Chlorhexidine Gluconate Cloth  6 each  Topical Q0600  . feeding supplement (ENSURE ENLIVE)  237 mL Oral BID BM  . ferrous sulfate  325 mg Oral Daily  . guaiFENesin  400 mg Oral TID  . labetalol  5 mg Intravenous TID  . lisinopril  5 mg Oral Daily  . mouth rinse  15 mL Mouth Rinse q12n4p  . multivitamin with minerals  1 tablet Oral Daily  . mupirocin ointment  1 application Nasal BID  . [START ON 11/05/2017] Vitamin D (Ergocalciferol)  50,000 Units Oral Q30 days    Continuous Infusions: . dextrose 5 % and 0.45% NaCl 75 mL/hr at 10/18/17 1414  . lacosamide (VIMPAT) IV Stopped (10/18/17 0554)  . levETIRAcetam Stopped (10/18/17 0603)  . piperacillin-tazobactam (ZOSYN)  IV 3.375 g (10/18/17 1126)     LOS: 2 days     Kayleen Memos, MD [facility] Pager [phone]  If 7PM-7AM, please contact night-coverage [email or web address] Password [facility] 10/18/2017, 4:05 PM

## 2017-10-19 ENCOUNTER — Encounter (HOSPITAL_COMMUNITY): Payer: Self-pay | Admitting: Neurology

## 2017-10-19 ENCOUNTER — Inpatient Hospital Stay (HOSPITAL_COMMUNITY)
Admit: 2017-10-19 | Discharge: 2017-10-19 | Disposition: A | Discharge status: Home / Self Care | Payer: Medicare Other | Attending: Oncology | Admitting: Oncology

## 2017-10-19 DIAGNOSIS — R569 Unspecified convulsions: Secondary | ICD-10-CM

## 2017-10-19 LAB — BASIC METABOLIC PANEL
ANION GAP: 10 (ref 5–15)
BUN: <5 mg/dL — ABNORMAL LOW (ref 8–23)
CALCIUM: 8.5 mg/dL — AB (ref 8.9–10.3)
CO2: 24 mmol/L (ref 22–32)
CREATININE: 0.6 mg/dL — AB (ref 0.61–1.24)
Chloride: 104 mmol/L (ref 98–111)
GFR calc Af Amer: >60 mL/min (ref >60)
Glucose, Bld: 123 mg/dL — ABNORMAL HIGH (ref 70–99)
Potassium: 3 mmol/L — ABNORMAL LOW (ref 3.5–5.1)
Sodium: 138 mmol/L (ref 135–145)

## 2017-10-19 LAB — CBC
HEMATOCRIT: 32 % — AB (ref 39.0–52.0)
HEMOGLOBIN: 10.4 g/dL — AB (ref 13.0–17.0)
MCH: 30.4 pg (ref 26.0–34.0)
MCHC: 32.5 g/dL (ref 30.0–36.0)
MCV: 93.6 fL (ref 78.0–100.0)
PLATELETS: 232 10*3/uL (ref 150–400)
RBC: 3.42 MIL/uL — AB (ref 4.22–5.81)
RDW: 15.9 % — ABNORMAL HIGH (ref 11.5–15.5)
WBC: 8.6 10*3/uL (ref 4.0–10.5)

## 2017-10-19 MED ORDER — KCL IN DEXTROSE-NACL 10-5-0.45 MEQ/L-%-% IV SOLN
INTRAVENOUS | Status: DC
  Administered 2017-10-19 – 2017-10-21 (×4): via INTRAVENOUS
  Filled 2017-10-19 (×4): qty 1000

## 2017-10-19 MED ORDER — LABETALOL HCL 5 MG/ML IV SOLN
5.0000 mg | Freq: Four times a day (QID) | INTRAVENOUS | Status: DC
  Administered 2017-10-19 – 2017-10-21 (×8): 5 mg via INTRAVENOUS
  Filled 2017-10-19 (×9): qty 4

## 2017-10-19 MED ORDER — POTASSIUM CHLORIDE 2 MEQ/ML IV SOLN: INTRAVENOUS | Status: DC

## 2017-10-19 NOTE — Care Management Important Message (Signed)
Important Message  Patient Details  Name: Kevin Humphrey MRN: [id number] Date of Birth: [birth year]   Medicare Important Message Given:  Yes    Kerin Salen 10/19/2017, 10:01 AMImportant Message  Patient Details  Name: Kevin Humphrey MRN: [id number] Date of Birth: 06/21/1950   Medicare Important Message Given:  Yes    Kerin Salen 10/19/2017, 10:01 AM

## 2017-10-19 NOTE — Procedures (Signed)
History: 67 yo M with seizures  Sedation: none  Technique: This is a 21 channel routine scalp EEG performed at the bedside with bipolar and monopolar montages arranged in accordance to the international 10/20 system of electrode placement. One channel was dedicated to EKG recording.    Background: The background consists of anomaly of generalized irregular delta and theta activities.  There is a posterior dominant rhythm of 5 to 6 Hz which is poorly formed.  Sleep structures are recorded and are symmetric.   Photic stimulation: Physiologic driving is not performed  EEG Abnormalities: 1) generalized irregular slow activity 2) slow PDR  Clinical Interpretation: This EEG is consistent with a nonspecific generalized cerebral dysfunction (encephalopathy). There was no seizure or seizure predisposition recorded on this study. Please note that a normal EEG does not preclude the possibility of epilepsy.   Roland Rack, MD [facility] [phone]  If 7pm- 7am, please page neurology on call as listed in [place].

## 2017-10-19 NOTE — Progress Notes (Signed)
This RN called Clemens Catholic NP [phone], Blenda Mounts (patient's guardian) [phone], and Ms Gaspar Garbe, DON at [place] to imform them that patient had been moved to [place]

## 2017-10-19 NOTE — Progress Notes (Signed)
SLP Cancellation Note  Patient Details Name: Kevin Humphrey MRN: [id number] DOB: [birth year]   Cancelled treatment:       Reason Eval/Treat Not Completed: Patient at procedure or test/unavailable;Patient's level of consciousness; pt having EEG when SLP arrived for tx session; pt lethargic (postictal) earlier in day; ST will f/u when able for dysphagia management; re-assessment of swallow when pt medically able and LOA improves.   Elvina Sidle, M.S., CCC-SLP 10/19/2017, 1:46 PM

## 2017-10-19 NOTE — Progress Notes (Signed)
EEG complete - results pending 

## 2017-10-19 NOTE — Progress Notes (Addendum)
Patient repeatedly calling out "I'm sick, I have a temperature." Patient noted to be coughing up a small amount clear frothy phlegm and coughing as is strangling. RN at bedside providing oral suction and raising HOB to 90 degrees. A few minutes later, RN was a bedside and patient vomited up a larger amount of clear frothy secretions. RN noted that  Patient was restless during this time. RN gave patient a dose of Zofran for potential nausea. Patient remained restless and continuously mumbling incomprehensibly and repeatatively. RN received a call from monitor tech that HR was sustaining 140's for 15 minutes. RN going to bedside to assess patient and obtain BP and MD was at bedside. O2 sats 97% on 2L oxygen. MD assessed patient and orders received to give 2mg  IV Ativan for possible seizure activity. Ativan given and patient calmed down HR began to decrease immediately. Orders received to get a STAT EEG and to transfer patient to [facility]. Will continue to monitor patient and transfer when bed available.   Othella Boyer [facility]

## 2017-10-19 NOTE — Progress Notes (Signed)
PROGRESS NOTE  Kevin Humphrey QHU:[id number] DOB: 1950-12-31 DOA: 10/14/2017 PCP: [name], MD  HPI/Recap of past 24 hours: Kevin Humphrey is a 67 year old male with past medical history significant for dementia, schizophrenia, aspiration pneumonia, seizure disorder who presented from SNF due to multiple seizures that occurred on 10/14/2017.  Started on IV antiseizure medication.  10/17/2017: Patient seen and examined at his bedside.  He is somnolent and appears uncomfortable.  Unable to obtain history due to altered mental status.  10/18/2017: Seen and examined at bedside not interactive however does not appear to be in distress.  Called legal guardian [facility] and left a message.  Will attempt to call again tomorrow.  10/19/17: Arrived in pt's room and he appeared to be actively seizing. Per nurse, was having these generalized repetitive movements for possibly 15 minutes. 2 mg IV ativan given stat which seemed to abort it. Now appears to be in post ictal state. Minimally responsive but protecting his airways. Neurology consulted. Transferring patient to [facility] for possible continuous EEG.  Already on IV vimpat and IV keppra.  Assessment/Plan: Principal Problem:   Convulsions/seizures ([place]) Active Problems:   Schizophrenia ([place])   Pneumonia   Anemia, chronic disease   Seizure ([place])   Dementia  Acute metabolic encephalopathy most likely multifactorial secondary to suspected aspiration pneumonia versus breakthrough seizures CT head no contrast unremarkable for any acute intracranial findings Unclear what his baseline is Fall precautions Called legal guardian with no response on 10/18/17 Seizures precautions Aspiration precautions NPO  Suspected breakthrough seizures IV ativan prn for breakthrough seizures C/w IV vimpat  C/w IV keppra Neurology consulted, Dr Tobias Alexander. Highly appreciated. Seizures precautions Aspiration precautions May need continuous/video EEG Transfer  to [facility] for above  Hypokalemia K+ 3.0 Add 13meq Kcl to D5 1/2 NS at 100cc/hr Add 2g IV mg2+ once  Repeat BMP in the am Obtain mag level in the am  Suspected aspiration pneumonia Continue IV Zosyn Obtain sputum culture Personally reviewed chest x-ray done on 10/14/2017 which revealed right middle lobe and right lower lobe infiltrates. Speech therapist recommends n.p.o.  Severe dysphagia NPO Speech therapist evaluated and recommended n.p.o. Continue D5 half-normal saline at 75 cc/h to avoid hypoglycemia  Accelerated hypertension NPO Increase frequency of IV labetalol 5mg  q6H Continue to closely monitor vital signs on telemetry  Chronic normocytic anemia Hemoglobin 10.3 at baseline No sign of overt bleeding Repeat CBC in the morning    Code Status: DNR  Family Communication: Called legal guardian on 10/18/2017 and left a message.   Disposition Plan: SNF when clinically stable   Consultants:  Neurology  Procedures:  None  Antimicrobials:  IV zosyn  DVT prophylaxis: SCDs; Not on chemical DVT prophylaxis due to recent GI bleed   Objective: Vitals:   10/19/17 0514 10/19/17 0730 10/19/17 0840 10/19/17 0846  BP: (!) 156/96   (!) 150/85  Pulse: (!) 107   (!) 113  Resp: 18   20  Temp: 98.2 F (36.8 C) 98.8 F (37.1 C)    TempSrc:  Oral    SpO2: (!) 89%  97% 95%  Weight:      Height:        Intake/Output Summary (Last 24 hours) at 10/19/2017 1221 Last data filed at 10/19/2017 1208 Gross per 24 hour  Intake 1150 ml  Output -  Net 1150 ml   Filed Weights   10/14/17 2318  Weight: 68.1 kg (150 lb 2.1 oz)    Exam:  . General: 67 y.o.  year-old male WD wN Seizure activity initially. Now appears post ictal. Drowsy but grimaces to sternal rubs. . Cardiovascular: RRR no rubs or gallops. No JVD or thyromegaly. Marland Kitchen Respiratory: Mild rhonchi with no wheezes.  Poor inspiratory effort.   . Abdomen: Soft nontender nondistended with hypo-active bowel sounds x4  quadrant.   . Musculoskeletal: B/L upper extremity edema. Trace LE edema bilaterally. Marland Kitchen Psychiatry: Unable to assess mood due to altered mental status.   Data Reviewed: CBC: Recent Labs  Lab 10/14/17 1834 10/15/17 0422 10/18/17 0654 10/19/17 0806  WBC 12.7* 13.3* 4.1 8.6  NEUTROABS 9.7*  --   --   --   HGB 10.0* 10.3* 9.4* 10.4*  HCT 30.7* 31.0* 29.2* 32.0*  MCV 95.6 93.1 94.8 93.6  PLT 437* 415* 204 505   Basic Metabolic Panel: Recent Labs  Lab 10/14/17 1834 10/15/17 0422 10/18/17 0654 10/19/17 0806  NA 139 140 139 138  K 3.8 3.7 3.2* 3.0*  CL 106 107 107 104  CO2 25 22 24 24   GLUCOSE 86 75 100* 123*  BUN 15 14 6* <5*  CREATININE 0.69 0.66 0.66 0.60*  CALCIUM 8.5* 8.5* 8.3* 8.5*  MG 1.7  --   --   --   PHOS 2.1*  --   --   --    GFR: Estimated Creatinine Clearance: 85.1 mL/min (A) (by C-G formula based on SCr of 0.6 mg/dL (L)). Liver Function Tests: Recent Labs  Lab 10/14/17 1834 10/18/17 0654  AST 17 24  ALT 15* 16  ALKPHOS 44 42  BILITOT 1.1 0.7  PROT 6.8 6.2*  ALBUMIN 3.0* 2.6*   No results for input(s): LIPASE, AMYLASE in the last 168 hours. No results for input(s): AMMONIA in the last 168 hours. Coagulation Profile: No results for input(s): INR, PROTIME in the last 168 hours. Cardiac Enzymes: No results for input(s): CKTOTAL, CKMB, CKMBINDEX, TROPONINI in the last 168 hours. BNP (last 3 results) No results for input(s): PROBNP in the last 8760 hours. HbA1C: No results for input(s): HGBA1C in the last 72 hours. CBG: No results for input(s): GLUCAP in the last 168 hours. Lipid Profile: No results for input(s): CHOL, HDL, LDLCALC, TRIG, CHOLHDL, LDLDIRECT in the last 72 hours. Thyroid Function Tests: No results for input(s): TSH, T4TOTAL, FREET4, T3FREE, THYROIDAB in the last 72 hours. Anemia Panel: No results for input(s): VITAMINB12, FOLATE, FERRITIN, TIBC, IRON, RETICCTPCT in the last 72 hours. Urine analysis:    Component Value Date/Time     COLORURINE YELLOW 10/14/2017 [place] 10/14/2017 1650   LABSPEC 1.020 10/14/2017 1650   PHURINE 5.0 10/14/2017 1650   GLUCOSEU 50 (A) 10/14/2017 1650   HGBUR MODERATE (A) 10/14/2017 1650   BILIRUBINUR NEGATIVE 10/14/2017 1650   KETONESUR 5 (A) 10/14/2017 1650   PROTEINUR NEGATIVE 10/14/2017 1650   UROBILINOGEN 0.2 12/20/2014 1157   NITRITE NEGATIVE 10/14/2017 1650   LEUKOCYTESUR NEGATIVE 10/14/2017 1650   Sepsis Labs: @LABRCNTIP (procalcitonin:4,lacticidven:4)  ) Recent Results (from the past 240 hour(s))  Urine culture     Status: None   Collection Time: 10/14/17  8:18 PM  Result Value Ref [place] Status   Specimen Description   Final    URINE, RANDOM Performed at [facility], [place] [address], [place], [place] [address]    Special Requests   Final    NONE Performed at [facility], [place] [address] [place], [place] [address]    Culture   Final    NO GROWTH  Performed at [facility], [place] [address] [place], [place] [address]    Report Status 10/16/2017 FINAL  Final  MRSA PCR Screening     Status: None   Collection Time: 10/15/17 10:47 AM  Result Value Ref [place] Status   MRSA by PCR NEGATIVE NEGATIVE Final    Comment:        The GeneXpert MRSA Assay (FDA approved for NASAL specimens only), is one component of a comprehensive MRSA colonization surveillance program. It is not intended to diagnose MRSA infection nor to guide or monitor treatment for MRSA infections. Performed at [facility], [place] [address], [place], [place] [address]       Studies: No results found.  Scheduled Meds: . chlorhexidine  15 mL Mouth Rinse BID  . Chlorhexidine Gluconate Cloth  6 each Topical Q0600  . feeding supplement (ENSURE ENLIVE)  237 mL Oral BID BM  . ferrous sulfate  325 mg Oral Daily  . guaiFENesin  400 mg Oral TID  . labetalol  5 mg Intravenous TID  . lisinopril  5 mg Oral Daily  . mouth  rinse  15 mL Mouth Rinse q12n4p  . multivitamin with minerals  1 tablet Oral Daily  . mupirocin ointment  1 application Nasal BID  . [START ON 11/05/2017] Vitamin D (Ergocalciferol)  50,000 Units Oral Q30 days    Continuous Infusions: . dextrose 5 % and 0.45% NaCl 75 mL/hr at 10/19/17 0400  . lacosamide (VIMPAT) IV Stopped (10/19/17 6256)  . levETIRAcetam Stopped (10/19/17 0715)  . piperacillin-tazobactam (ZOSYN)  IV 3.375 g (10/19/17 1122)     LOS: 3 days     Kayleen Memos, MD [facility] Pager [phone]  If 7PM-7AM, please contact night-coverage [email or web address] Password [email or web address] 10/19/2017, 12:21 PM

## 2017-10-19 NOTE — Consult Note (Addendum)
NEURO HOSPITALIST CONSULT NOTE   Requestig physician: Dr. Nevada Crane   Reason for Consult: Prolonged altered mental status  History obtained from: Chart  HPI:                                                                                                                                          Kevin Humphrey is an 67 y.o. male past medical history of schizophrenia and dementia.  Patient was seen back in 09/28/2017 by neurology at that time EEG did  revealed a 5 minute seizure, which was concerning for NCSE. He was loaded with 1500 mg Keppra evening and transferred to [facility] for EEG monitoring.  Initial review of LTM EEG at 6 AM revealed 3-4 runsof rhythmic paroxysmal fast activity in bilateral frontal and parietotemporal leads consistent with seizures lasting several minutes,mostrecent and longest one starting at 5:41 amlasting for about 15 minconsistent with non convulsive status epilepticus. He was then loaded with an additional 1500 mg of Keppra with maintenance dose increased to 1500 mg BID.  Later there was discussion with the reading of the  EEG and it was felt that these might be interpreted as SIRPIDs with no epileptiform activity and the previous stating was not epileptiform.  The third reading of the EEG did show epileptiform activity mostly in the frontal central or paracentral anterior cortex.  At that time patient was placed on 1500 mg of Keppra twice daily and to continue valproic acid at 500 mg 3 times daily along with starting Vimpat at 200 mg twice daily.  4 days later since nonconvulsive status epilepticus had resolved.  She was discharged on 6/19.  On 10/14/2017 patient was readmitted to the hospital.  Chief complaint was seizures due to refusing meds.  The chart apparently the patient was recently diagnosed with pneumonia he was placed on antibiotics but he was refusing to take his oral medications.  These included his antiepileptics.  She was referred for admission  uncontrolled seizures.   Currently patient is on Vimpat 200 mg twice daily, Keppra 1500 mg twice daily and lorazepam 2 mg every 4 hours as needed.  Currently this morning per nurse's note patient had a period in which his blood pressure went up into the 140s for approximately 15 minutes and he was repeatedly saying "I am sick, I have a temperature, and would say this over and over.  Currently patient has a heart rate of 92 satting well, resting however with noxious stimuli to sternal rub he does localize to pain and grimace, withdraws from all pain and at one point said ouch.  With eyelids open and he has roving eyes.  Does not follow any verbal commands.      Past Medical History:  Diagnosis Date  . Allergic rhinitis 08/02/2012  .  Depression 08/02/2012  . Dysphagia, unspecified 08/02/2012  . GERD (gastroesophageal reflux disease) 08/02/2012  . Other and unspecified hyperlipidemia 08/02/2012  . Schizophrenia ([name]) 08/02/2012  . Vertebral compression fracture ([place]) 08/02/2012    Past Surgical History:  Procedure Laterality Date  . EXPLORATORY LAPAROTOMY     ??? (Has large midline incision)  . LAPAROSCOPY N/A 09/22/2017   Procedure: LAPAROSCOPY DIAGNOSTIC  LYSIS OF ADHESIONS;  Surgeon: Michael Boston, MD;  Location: [facility];  Service: General;  Laterality: N/A;    Family History  Family history unknown: Yes              Social History:  reports that he has quit smoking. He has never used smokeless tobacco. He reports that he drank alcohol. He reports that he has current or past drug history.  No Known Allergies  MEDICATIONS:                                                                                                                     Prior to Admission:  Medications Prior to Admission  Medication Sig Dispense Refill Last Dose  . bisacodyl (DULCOLAX) 10 MG suppository Place 10 mg rectally as needed for moderate constipation.   unknown  . Cholecalciferol (VITAMIN D3) 50000 units CAPS  Take 50,000 Units by mouth every 30 (thirty) days. On the 15th   Past Month at Unknown time  . feeding supplement, ENSURE ENLIVE, (ENSURE ENLIVE) LIQD Take 237 mLs by mouth 2 (two) times daily between meals. 237 mL 12 10/13/2017 at 1600  . guaiFENesin (ROBITUSSIN) 100 MG/5ML liquid Take 400 mg by mouth 3 (three) times daily. 5 Day Course. Start Date 10/12/17.   Past Week at Unknown time  . ipratropium-albuterol (DUONEB) 0.5-2.5 (3) MG/3ML SOLN Take 3 mLs by nebulization every 6 (six) hours.   10/13/2017 at 0600  . lisinopril (PRINIVIL,ZESTRIL) 5 MG tablet Take 5 mg by mouth daily.   Past Week at Unknown time  . LORAZEPAM PO Apply 1 mg topically every 6 (six) hours as needed (seizures).   10/13/2017  . Multiple Vitamin (MULTIVITAMIN) tablet Take 1 tablet by mouth daily.   Past Week at Unknown time  . ondansetron (ZOFRAN) 4 MG tablet Take 4 mg by mouth every 8 (eight) hours as needed for nausea or vomiting.   unknown  . polyethylene glycol (MIRALAX / GLYCOLAX) packet Take 17 g by mouth 2 (two) times daily. (Patient taking differently: Take 17 g by mouth daily. ) 14 each 0 10/13/2017 at 0900  . Probiotic Product (PROBIOTIC PO) Take 1 capsule by mouth daily. 14 Day Course. Start Date 10/12/17.     Marland Kitchen Propylene Glycol (SYSTANE BALANCE) 0.6 % SOLN Apply 1 drop to eye 2 (two) times daily. For dry eyes   Past Week at Unknown time  . ranitidine (ZANTAC) 75 MG tablet Take 75 mg by mouth daily.   Past Week at Unknown time  . acetaminophen (TYLENOL) 325 MG tablet Take 2 tablets (650 mg total) by mouth every  6 (six) hours as needed for mild pain, moderate pain, fever or headache.   unknown  . ferrous sulfate 325 (65 FE) MG tablet Take 325 mg by mouth daily.   unknown  . lacosamide (VIMPAT) 200 MG TABS tablet Take 1 tablet (200 mg total) by mouth 2 (two) times daily. 60 tablet  10/12/2017 at 2100  . levETIRAcetam (KEPPRA) 500 MG tablet Take 3 tablets (1,500 mg total) by mouth 2 (two) times daily.   10/12/2017 at 2100    Scheduled: . chlorhexidine  15 mL Mouth Rinse BID  . Chlorhexidine Gluconate Cloth  6 each Topical Q0600  . feeding supplement (ENSURE ENLIVE)  237 mL Oral BID BM  . ferrous sulfate  325 mg Oral Daily  . guaiFENesin  400 mg Oral TID  . labetalol  5 mg Intravenous TID  . lisinopril  5 mg Oral Daily  . mouth rinse  15 mL Mouth Rinse q12n4p  . multivitamin with minerals  1 tablet Oral Daily  . mupirocin ointment  1 application Nasal BID  . [START ON 11/05/2017] Vitamin D (Ergocalciferol)  50,000 Units Oral Q30 days     ROS:                                                                                                                                       History obtained from unobtainable from patient due to mental status   Blood pressure (!) 150/85, pulse (!) 113, temperature 98.2 F (36.8 C), resp. rate 20, height 5\' 11"  (1.803 m), weight 68.1 kg (150 lb 2.1 oz), SpO2 95 %.   General Examination:                                                                                                       Physical Exam  HEENT-  Normocephalic, no lesions, without obvious abnormality.  Normal external eye and conjunctiva.   Extremities- Warm, dry and intact Musculoskeletal-no joint tenderness, deformity or swelling Skin-warm and dry, no hyperpigmentation, vitiligo, or suspicious lesions  Neurological Examination Mental Status: Patient is resting comfortably in his bed, when sternal rub he does localize to pain, does withdrawal from pain in all extremities but does not follow any instructions when spoken to. Cranial Nerves: II: No blink to threat III,IV, VI: Eyes held closed however when eyelids are opened patient does have roving eyes laterally back-and-forth, he does have reactive pupils by at laterally.   V,VII: Face symmetric and does respond to noxious stimuli  VIII: Does not respond to verbal stimuli IX,X: Unable to visualize  Motor: Withdrawing antigravity with good strength  from noxious stimuli Sensory: Responds to noxious stimuli Deep Tendon Reflexes: Interesting enough patient has no reflexes at the ankles, no deep tendon reflexes at the knees, no reflexes on the left arm but does have 2+ reflexes on his right arm Plantars: Right: downgoing   Left: downgoing Cerebellar: Unable to obtain Gait: Not tested   Lab Results: Basic Metabolic Panel: Recent Labs  Lab 10/14/17 1834 10/15/17 0422 10/18/17 0654 10/19/17 0806  NA 139 140 139 138  K 3.8 3.7 3.2* 3.0*  CL 106 107 107 104  CO2 25 22 24 24   GLUCOSE 86 75 100* 123*  BUN 15 14 6* <5*  CREATININE 0.69 0.66 0.66 0.60*  CALCIUM 8.5* 8.5* 8.3* 8.5*  MG 1.7  --   --   --   PHOS 2.1*  --   --   --     CBC: Recent Labs  Lab 10/14/17 1834 10/15/17 0422 10/18/17 0654 10/19/17 0806  WBC 12.7* 13.3* 4.1 8.6  NEUTROABS 9.7*  --   --   --   HGB 10.0* 10.3* 9.4* 10.4*  HCT 30.7* 31.0* 29.2* 32.0*  MCV 95.6 93.1 94.8 93.6  PLT 437* 415* 204 232    Cardiac Enzymes: No results for input(s): CKTOTAL, CKMB, CKMBINDEX, TROPONINI in the last 168 hours.  Lipid Panel: No results for input(s): CHOL, TRIG, HDL, CHOLHDL, VLDL, LDLCALC in the last 168 hours.  Imaging: No intracranial imaging obtained  Assessment and plan per attending neurologist  Etta Quill PA-C [facility] [phone]  10/19/2017, 61:17 AM  67 year old male who had a breakthrough seizure initially due to medication noncompliance who had a subsequent episode concerning for seizure this morning.  Assessment:  67 year old male with breakthrough seizures, at this point likely due to pneumonia.  Unless he has continued spells, I am not certain that I would change his management as this would entail adding a third agent and I am not sure if he needs this long-term at this time.  Recommendations: -Seizure precautions - EEG and LTM - Continue to treat pneumonia - Continue to follow electrolytes - Patient is planned to be  transferred to [facility] -Continue Vimpat 200 mg twice daily and Keppra 1500 mg twice daily  Roland Rack, MD [facility] [phone]  If 7pm- 7am, please page neurology on call as listed in [place].

## 2017-10-19 NOTE — Progress Notes (Signed)
Report called to Raquel Sarna, Therapist, sports at [facility]. [facility] arranged. Patient stable for transport.

## 2017-10-20 DIAGNOSIS — R4789 Other speech disturbances: Secondary | ICD-10-CM

## 2017-10-20 DIAGNOSIS — J690 Pneumonitis due to inhalation of food and vomit: Secondary | ICD-10-CM

## 2017-10-20 DIAGNOSIS — F200 Paranoid schizophrenia: Secondary | ICD-10-CM

## 2017-10-20 LAB — POTASSIUM: POTASSIUM: 2.8 mmol/L — AB (ref 3.5–5.1)

## 2017-10-20 LAB — COMPREHENSIVE METABOLIC PANEL
ALK PHOS: 41 U/L (ref 38–126)
ALT: 14 U/L (ref 0–44)
AST: 106 U/L — AB (ref 15–41)
Albumin: 2.4 g/dL — ABNORMAL LOW (ref 3.5–5.0)
Anion gap: 6 (ref 5–15)
BILIRUBIN TOTAL: 0.6 mg/dL (ref 0.3–1.2)
CALCIUM: 8.2 mg/dL — AB (ref 8.9–10.3)
CO2: 28 mmol/L (ref 22–32)
CREATININE: 0.61 mg/dL (ref 0.61–1.24)
Chloride: 105 mmol/L (ref 98–111)
GFR calc Af Amer: >60 mL/min (ref >60)
GLUCOSE: 112 mg/dL — AB (ref 70–99)
Potassium: 2.9 mmol/L — ABNORMAL LOW (ref 3.5–5.1)
Sodium: 139 mmol/L (ref 135–145)
TOTAL PROTEIN: 5.5 g/dL — AB (ref 6.5–8.1)

## 2017-10-20 LAB — CBC
HCT: 29.3 % — ABNORMAL LOW (ref 39.0–52.0)
HEMOGLOBIN: 9.2 g/dL — AB (ref 13.0–17.0)
MCH: 29.8 pg (ref 26.0–34.0)
MCHC: 31.4 g/dL (ref 30.0–36.0)
MCV: 94.8 fL (ref 78.0–100.0)
Platelets: 191 10*3/uL (ref 150–400)
RBC: 3.09 MIL/uL — ABNORMAL LOW (ref 4.22–5.81)
RDW: 15.7 % — ABNORMAL HIGH (ref 11.5–15.5)
WBC: 4.7 10*3/uL (ref 4.0–10.5)

## 2017-10-20 LAB — MAGNESIUM
MAGNESIUM: 1.4 mg/dL — AB (ref 1.7–2.4)
Magnesium: 1.9 mg/dL (ref 1.7–2.4)

## 2017-10-20 LAB — PHOSPHORUS: Phosphorus: 2.5 mg/dL (ref 2.5–4.6)

## 2017-10-20 MED ORDER — SODIUM CHLORIDE 0.9 % IV SOLN
INTRAVENOUS | Status: DC | PRN
  Administered 2017-10-20 – 2017-10-21 (×2): via INTRAVENOUS

## 2017-10-20 MED ORDER — POTASSIUM CHLORIDE CRYS ER 20 MEQ PO TBCR
40.0000 meq | EXTENDED_RELEASE_TABLET | Freq: Once | ORAL | Status: DC
  Filled 2017-10-20 (×2): qty 2

## 2017-10-20 MED ORDER — POTASSIUM CHLORIDE 10 MEQ/100ML IV SOLN
10.0000 meq | INTRAVENOUS | Status: AC
  Administered 2017-10-20 – 2017-10-21 (×4): 10 meq via INTRAVENOUS
  Filled 2017-10-20 (×5): qty 100

## 2017-10-20 MED ORDER — MAGNESIUM SULFATE 2 GM/50ML IV SOLN
2.0000 g | Freq: Once | INTRAVENOUS | Status: AC
  Administered 2017-10-20: 2 g via INTRAVENOUS
  Filled 2017-10-20: qty 50

## 2017-10-20 MED ORDER — POTASSIUM CHLORIDE 20 MEQ PO PACK
40.0000 meq | PACK | Freq: Two times a day (BID) | ORAL | Status: DC
  Administered 2017-10-20: 40 meq via ORAL
  Filled 2017-10-20 (×7): qty 2

## 2017-10-20 NOTE — Progress Notes (Signed)
Subjective: Significantly improved overnight.  He has significant repetitive speech asking for his blue cap.  I called his facility informing that this type of repetitive speech is normal for him.  At baseline, he can usually make his needs with repetitive speech.  He does have a blue and a monkey  Exam: Vitals:   10/20/17 0350 10/20/17 0720  BP: (!) 172/83 (!) 174/97  Pulse: 74   Resp: 18   Temp: 98.3 F (36.8 C)   SpO2: 100%    Gen: In bed, NAD Resp: non-labored breathing, no acute distress Abd: soft, nt  Neuro: MS: Awake, alert, perseverative repetitive infantile speech CN: He fixates and tracks me across midline in both directions Motor: He was extremity spontaneously Sensory: Response to stimulation in all 4 extremities  Pertinent Labs: Hypomagnesemia at 1.4  Impression: 67 year old male with a history of schizophrenia, dementia recently treated for seizures as an inpatient who presented with recurrent seizures in the setting of refusal of medications.  He had a recurrent seizure yesterday morning in the setting of pneumonia.  I am hesitant to add a third agent at this time, but if he were to have any further episodes then a third agent would be necessary.  Recommendations: 1) continue Keppra 1500 mg twice daily and Vimpat 200 mg twice daily 2) I have put in for magnesium repletion given that hypomagnesemia can contribute to breakthrough seizures, this may have been a contributor to yesterday's seizure 3) neurology will continue to follow  Roland Rack, MD [facility] [phone]  If 7pm- 7am, please page neurology on call as listed in [place].

## 2017-10-20 NOTE — Evaluation (Signed)
Physical Therapy Evaluation Patient Details Name: Kevin Humphrey MRN: [id number] DOB: November 02, 1950 Today's Date: 10/20/2017   History of Present Illness  Pt is a 67 y.o. male admitted from [facility] on 10/14/17 with reports of seizure activity; of note, recent CXR showing pneumonia. EEG 6/28 consistent with nonspecific generalized cerebral dysfunction; no seizure recorded. PMH includes dementia, schizophrenia, aspiration pneumonia, seizure disorder.     Clinical Impression  Patient evaluated by Physical Therapy with no further acute PT needs identified. Pt from SNF requiring assist at baseline. Pt not participating or following commands this session, actively resisting attempts to mobilize; maxA+2 for bed mobility (moves all four extremities, but not purposefully). Will defer treatment back to SNF. Acute PT is signing off. Thank you for this referral.    Follow Up Recommendations SNF;Supervision/Assistance - 24 hour    Equipment Recommendations  None recommended by PT    Recommendations for Other Services       Precautions / Restrictions Precautions Precautions: Fall Restrictions Weight Bearing Restrictions: No      Mobility  Bed Mobility   Bed Mobility: Rolling Rolling: Max assist         General bed mobility comments: MaxA for bed mobility; pt able to push up with BLEs, but not purposefully or to commands. OOB mobility deferred secondary to pt actively resisting all movements/assist  Transfers                    Ambulation/Gait                Stairs            Wheelchair Mobility    Modified Rankin (Stroke Patients Only)       Balance                                             Pertinent Vitals/Pain Pain Assessment: Faces Faces Pain Scale: Hurts little more Pain Location: RUE with PROM Pain Descriptors / Indicators: Grimacing Pain Intervention(s): Limited activity within patient's tolerance    Home Living  Family/patient expects to be discharged to:: Skilled nursing facility                 Additional Comments: Per SW, from [facility]    Prior Function Level of Independence: Needs assistance         Comments: pt unable to provide baseline info, likely needed assistance     Hand Dominance        Extremity/Trunk Assessment   Upper Extremity Assessment Upper Extremity Assessment: Generalized weakness(Grimacing and actively resisting R shoulder/elbow ROM)    Lower Extremity Assessment Lower Extremity Assessment: Generalized weakness       Communication   Communication: Expressive difficulties(slurred speech; perseverates on words he hears; 4-5 words at a time)  Cognition Arousal/Alertness: Awake/alert Behavior During Therapy: Flat affect;Restless;Impulsive Overall Cognitive Status: No family/caregiver present to determine baseline cognitive functioning Area of Impairment: Attention;Following commands;Safety/judgement;Awareness;Problem solving                   Current Attention Level: Focused   Following Commands: Follows one step commands inconsistently Safety/Judgement: Decreased awareness of safety;Decreased awareness of deficits Awareness: Intellectual Problem Solving: Decreased initiation;Difficulty sequencing;Requires verbal cues;Requires tactile cues General Comments: Pt perseverating on statements made by therapist. Not following commands; actively resisting movements      General Comments  Exercises     Assessment/Plan    PT Assessment All further PT needs can be met in the next venue of care  PT Problem List Decreased activity tolerance;Decreased balance;Decreased mobility;Decreased cognition       PT Treatment Interventions      PT Goals (Current goals can be found in the Care Plan section)  Acute Rehab PT Goals PT Goal Formulation: Patient unable to participate in goal setting    Frequency     Barriers to discharge         Co-evaluation               AM-PAC PT "6 Clicks" Daily Activity  Outcome Measure Difficulty turning over in bed (including adjusting bedclothes, sheets and blankets)?: Unable Difficulty moving from lying on back to sitting on the side of the bed? : Unable Difficulty sitting down on and standing up from a chair with arms (e.g., wheelchair, bedside commode, etc,.)?: Unable Help needed moving to and from a bed to chair (including a wheelchair)?: Total Help needed walking in hospital room?: Total Help needed climbing 3-5 steps with a railing? : Total 6 Click Score: 6    End of Session   Activity Tolerance: Other (comment)(Pt appearing agitated with questions; actively resisting movements) Patient left: in bed;with bed alarm set;with call bell/phone within reach;with SCD's reapplied Nurse Communication: Mobility status PT Visit Diagnosis: Difficulty in walking, not elsewhere classified (R26.2);Other abnormalities of gait and mobility (R26.89)    Time: 2481-4439 PT Time Calculation (min) (ACUTE ONLY): 12 min   Charges:   PT Evaluation $PT Eval Moderate Complexity: 1 Mod     PT G Codes:       Mabeline Caras, PT, DPT Acute Rehab Services  Pager: [place] 10/20/2017, 10:43 AM

## 2017-10-20 NOTE — Progress Notes (Signed)
PROGRESS NOTE    Kevin Humphrey  KNL:[id number] DOB: 1950-07-19 DOA: 10/14/2017 PCP: [name], MD   Brief Narrative:  67 year old WM PMHx Anxiety, dementia, schizophrenia, seizure disorder, aspiration pneumonia, ileus/SBO S/P lap adhesiolysis 09/22/2017 (during previous admission ) discharged on 10/10/2017.  Also On that visit Tx for  acute respiratory failure with hypoxia, sepsis secondary aspiration pneumonia metabolic encephalopathy,  Presented from SNF due to multiple seizures that occurred on 10/14/2017.  Started on IV antiseizure medication.   10/17/2017: Patient seen and examined at his bedside.  He is somnolent and appears uncomfortable.  Unable to obtain history due to altered mental status.   10/18/2017: Seen and examined at bedside not interactive however does not appear to be in distress.  Called legal guardian [facility] and left a message.  Will attempt to call again tomorrow.   10/19/17: Arrived in pt's room and he appeared to be actively seizing. Per nurse, was having these generalized repetitive movements for possibly 15 minutes. 2 mg IV ativan given stat which seemed to abort it. Now appears to be in post ictal state. Minimally responsive but protecting his airways. Neurology consulted. Transferring patient to [facility] for possible continuous EEG.  Already on IV vimpat and IV keppra.    Subjective: 6/29 A/O x2 (does not know where, why).  Does know he is in the hospital and that he is sick.  Negative CP, negative S OB, negative abdominal pain.  Cooperative with exam   Assessment & Plan:   Principal Problem:   Convulsions/seizures ([place]) Active Problems:   Schizophrenia ([place])   Pneumonia   Anemia, chronic disease   Seizure ([place])   Dementia  Acute metabolic encephalopathy -most likely multifactorial seizure, schizophrenia, aspiration pneumonia? -CT head unremarkable   Schizophrenia   Aspiration pneumonia?/RML/RLL pneumonia -  6/23 CXR RML/RLL  infiltrates -Continue empiric antibiotics - Aspiration precautions - N.p.o. - Head of bed> 35 degrees    Suspected breakthrough seizures IV ativan prn for breakthrough seizures C/w IV vimpat  C/w IV keppra Neurology consulted, Dr Tobias Alexander.  Seizures precautions Aspiration precautions    Dysphasia - 6/29 patient passed swallow evaluation dysphasia 1 thin liquid   Accelerated hypertension NPO Increase frequency of IV labetalol 5mg  q6H Continue to closely monitor vital signs on telemetry   Chronic normocytic anemia Hemoglobin 10.3 at baseline No sign of overt bleeding Repeat CBC in the morning  Hypokalemia -K-dur 40 meq x2 doses - D5W- 0.45% saline+KCL 41meq 75 ml/hr -Repeat K/Mg @1600 ---> potassium still low potassium IV 50 mEq  Hypomagnesmia - Magnesium IV 3 g      DVT prophylaxis: SCD Code Status: DNR Family Communication: None Disposition Plan: TBD   Consultants:  Neuro hospitalist    Procedures/Significant Events:  CXR   I have personally reviewed and interpreted all radiology studies and my findings are as above.  VENTILATOR SETTINGS:    Cultures   Antimicrobials: Anti-infectives (From admission, onward)   Start     Stop   10/17/17 1200  piperacillin-tazobactam (ZOSYN) IVPB 3.375 g         10/14/17 2200  levofloxacin (LEVAQUIN) IVPB 500 mg  Status:  Discontinued     10/17/17 1115       Devices    LINES / TUBES:     Continuous Infusions: . dextrose 5 % and 0.45 % NaCl with KCl 10 mEq/L 75 mL/hr at 10/20/17 1149  . lacosamide (VIMPAT) IV 200 mg (10/20/17 0606)  . levETIRAcetam 1,500 mg (10/20/17 0542)  .  magnesium sulfate 1 - 4 g bolus IVPB 2 g (10/20/17 1147)  . piperacillin-tazobactam (ZOSYN)  IV 3.375 g (10/20/17 0349)     Objective: Vitals:   10/19/17 2352 10/20/17 0350 10/20/17 0720 10/20/17 0800  BP: (!) 162/75 (!) 172/83 (!) 174/97 (!) 167/79  Pulse: 76 74  75  Resp: 18 18  (!) 26  Temp: 98.1 F (36.7 C) 98.3  F (36.8 C)    TempSrc: Axillary Axillary    SpO2: 100% 100%  100%  Weight:  161 lb 2.5 oz (73.1 kg)    Height:        Intake/Output Summary (Last 24 hours) at 10/20/2017 1231 Last data filed at 10/20/2017 0823 Gross per 24 hour  Intake 1333.21 ml  Output -  Net 1333.21 ml   Filed Weights   10/14/17 2318 10/20/17 0350  Weight: 150 lb 2.1 oz (68.1 kg) 161 lb 2.5 oz (73.1 kg)    Examination:  General: A/O x2 (does not know where, why).  Does know he is in the hospital and that he is sick No acute respiratory distress Neck:  Negative scars, masses, torticollis, lymphadenopathy, JVD Lungs: Clear to auscultation bilaterally without wheezes or crackles Cardiovascular: Regular rate and rhythm without murmur gallop or rub normal S1 and S2 Abdomen: negative abdominal pain, nondistended, positive soft, bowel sounds, no rebound, no ascites, no appreciable mass Extremities: No significant cyanosis, clubbing, or edema bilateral lower extremities Skin: Negative rashes, lesions, ulcers Psychiatric:  Negative depression, negative anxiety, negative fatigue, negative mania.  Dementia vs delirium vs mental delay Central nervous system:  Cranial nerves II through XII intact, tongue/uvula midline, moves all extremities to command.  Repetitive speech.  Positive  dysarthria, negative expressive aphasia, negative receptive aphasia.  .     Data Reviewed: Care during the described time interval was provided by me .  I have reviewed this patient's available data, including medical history, events of note, physical examination, and all test results as part of my evaluation.   CBC: Recent Labs  Lab 10/14/17 1834 10/15/17 0422 10/18/17 0654 10/19/17 0806 10/20/17 0330  WBC 12.7* 13.3* 4.1 8.6 4.7  NEUTROABS 9.7*  --   --   --   --   HGB 10.0* 10.3* 9.4* 10.4* 9.2*  HCT 30.7* 31.0* 29.2* 32.0* 29.3*  MCV 95.6 93.1 94.8 93.6 94.8  PLT 437* 415* 204 232 203   Basic Metabolic Panel: Recent Labs   Lab 10/14/17 1834 10/15/17 0422 10/18/17 0654 10/19/17 0806 10/20/17 0330  NA 139 140 139 138 139  K 3.8 3.7 3.2* 3.0* 2.9*  CL 106 107 107 104 105  CO2 25 22 24 24 28   GLUCOSE 86 75 100* 123* 112*  BUN 15 14 6* <5* <5*  CREATININE 0.69 0.66 0.66 0.60* 0.61  CALCIUM 8.5* 8.5* 8.3* 8.5* 8.2*  MG 1.7  --   --   --  1.4*  PHOS 2.1*  --   --   --  2.5   GFR: Estimated Creatinine Clearance: 91.4 mL/min (by C-G formula based on SCr of 0.61 mg/dL). Liver Function Tests: Recent Labs  Lab 10/14/17 1834 10/18/17 0654 10/20/17 0330  AST 17 24 106*  ALT 15* 16 14  ALKPHOS 44 42 41  BILITOT 1.1 0.7 0.6  PROT 6.8 6.2* 5.5*  ALBUMIN 3.0* 2.6* 2.4*   No results for input(s): LIPASE, AMYLASE in the last 168 hours. No results for input(s): AMMONIA in the last 168 hours. Coagulation Profile: No results for input(s):  INR, PROTIME in the last 168 hours. Cardiac Enzymes: No results for input(s): CKTOTAL, CKMB, CKMBINDEX, TROPONINI in the last 168 hours. BNP (last 3 results) No results for input(s): PROBNP in the last 8760 hours. HbA1C: No results for input(s): HGBA1C in the last 72 hours. CBG: No results for input(s): GLUCAP in the last 168 hours. Lipid Profile: No results for input(s): CHOL, HDL, LDLCALC, TRIG, CHOLHDL, LDLDIRECT in the last 72 hours. Thyroid Function Tests: No results for input(s): TSH, T4TOTAL, FREET4, T3FREE, THYROIDAB in the last 72 hours. Anemia Panel: No results for input(s): VITAMINB12, FOLATE, FERRITIN, TIBC, IRON, RETICCTPCT in the last 72 hours. Urine analysis:    Component Value Date/Time   COLORURINE YELLOW 10/14/2017 [place] 10/14/2017 1650   LABSPEC 1.020 10/14/2017 1650   PHURINE 5.0 10/14/2017 1650   GLUCOSEU 50 (A) 10/14/2017 1650   HGBUR MODERATE (A) 10/14/2017 1650   BILIRUBINUR NEGATIVE 10/14/2017 1650   KETONESUR 5 (A) 10/14/2017 1650   PROTEINUR NEGATIVE 10/14/2017 1650   UROBILINOGEN 0.2 12/20/2014 1157   NITRITE  NEGATIVE 10/14/2017 1650   LEUKOCYTESUR NEGATIVE 10/14/2017 1650   Sepsis Labs: @LABRCNTIP (procalcitonin:4,lacticidven:4)  ) Recent Results (from the past 240 hour(s))  Urine culture     Status: None   Collection Time: 10/14/17  8:18 PM  Result Value Ref Range Status   Specimen Description   Final    URINE, RANDOM Performed at [facility], [place] [address] [place], [place] [address]    Special Requests   Final    NONE Performed at [facility], [place] [address], [place], [place] [address]    Culture   Final    NO GROWTH Performed at [facility], [place] [address], [place], [place] [address]    Report Status 10/16/2017 FINAL  Final  MRSA PCR Screening     Status: None   Collection Time: 10/15/17 10:47 AM  Result Value Ref Range Status   MRSA by PCR NEGATIVE NEGATIVE Final    Comment:        The GeneXpert MRSA Assay (FDA approved for NASAL specimens only), is one component of a comprehensive MRSA colonization surveillance program. It is not intended to diagnose MRSA infection nor to guide or monitor treatment for MRSA infections. Performed at [facility], [place] [address], [place], [place] [address]          Radiology Studies: No results found.      Scheduled Meds: . chlorhexidine  15 mL Mouth Rinse BID  . feeding supplement (ENSURE ENLIVE)  237 mL Oral BID BM  . ferrous sulfate  325 mg Oral Daily  . guaiFENesin  400 mg Oral TID  . labetalol  5 mg Intravenous Q6H  . lisinopril  5 mg Oral Daily  . mouth rinse  15 mL Mouth Rinse q12n4p  . multivitamin with minerals  1 tablet Oral Daily  . [START ON 11/05/2017] Vitamin D (Ergocalciferol)  50,000 Units Oral Q30 days   Continuous Infusions: . dextrose 5 % and 0.45 % NaCl with KCl 10 mEq/L 75 mL/hr at 10/20/17 1149  . lacosamide (VIMPAT) IV 200 mg (10/20/17 0606)  . levETIRAcetam 1,500 mg (10/20/17 0542)  . magnesium sulfate 1 - 4 g bolus IVPB 2 g  (10/20/17 1147)  . piperacillin-tazobactam (ZOSYN)  IV 3.375 g (10/20/17 0349)     LOS: 4 days    Time spent: 40 minutes    WOODS, Geraldo Docker, MD [facility] Pager  [phone]   If 7PM-7AM, please contact night-coverage [email or web address] Password [facility] 10/20/2017, 12:31 PM

## 2017-10-20 NOTE — Progress Notes (Signed)
Patient states "I don't feel well". When asked if he wanted anti-nausea medication he shook his head yes. Patient administered Zofran. 30 minutes post administration, patient states he " I feel better now".

## 2017-10-20 NOTE — Progress Notes (Signed)
Attempted to give PO potassium for 10 minutes. Patient refuses to open mouth for pills. Md paged and made aware.

## 2017-10-20 NOTE — Progress Notes (Signed)
  Speech Language Pathology Treatment: Dysphagia  Patient Details Name: Kevin Humphrey MRN: [id number] DOB: 1951-04-22 Today's Date: 10/20/2017 Time: 7681-1572 SLP Time Calculation (min) (ACUTE ONLY): 13 min  Assessment / Plan / Recommendation Clinical Impression  Pt's mentation improved today; he is alert and eager for POs. Voicing is clear, with echolalia and perseverative speech noted. Accepts cup and straw sips of thin liquids with assistance; mild anterior spillage with cup. No overt signs of aspiration with consecutive straw sips of thin liquids. Suspect delayed initiation with cup sip> straw sip. Tongue thrusting noted with purees; clearance is adequate. Respiratory rate remained stable below 20 throughout trials. Recommend initiating dys 1, thin liquids, meds crushed in puree; straws OK. Will follow up for tolerance.     HPI HPI: Patient is a 67 y.o. male with PMH: advanced dementia, schizophrenia, HTN, GERD, depression, dysphagia, who was brought to hospital from SNF secondary to persistent nausea with hematemesis. Chest x-ray at SNF showed some infiltrates and abdomen x-ray concering for bowel obstruction. Patient diagnosed at hospital with sepsis likely secondary to aspiration PNA, anemia, HTN, high-grade small bowel obstruction. on 6/1, he had laproscopy diagnositic lysis of adhesions.  Pt today much more alert with oral cavity clear.  Today seen to assess po tolerance and readiness for dietary advancement.        SLP Plan  Continue with current plan of care       Recommendations  Diet recommendations: Dysphagia 1 (puree);Thin liquid Liquids provided via: Cup;Straw Medication Administration: Crushed with puree Supervision: Full supervision/cueing for compensatory strategies Compensations: Slow rate;Small sips/bites;Minimize environmental distractions Postural Changes and/or Swallow Maneuvers: Seated upright 90 degrees;Upright 30-60 min after meal                Oral Care  Recommendations: Oral care BID Follow up Recommendations: Skilled Nursing facility SLP Visit Diagnosis: Dysphagia, oropharyngeal phase (R13.12) Plan: Continue with current plan of care       Armstrong, [place], [place] Pathologist [place] 10/20/2017, 12:09 PM

## 2017-10-21 LAB — BASIC METABOLIC PANEL
ANION GAP: 6 (ref 5–15)
BUN: <5 mg/dL — ABNORMAL LOW (ref 8–23)
CALCIUM: 8.2 mg/dL — AB (ref 8.9–10.3)
CO2: 28 mmol/L (ref 22–32)
CREATININE: 0.75 mg/dL (ref 0.61–1.24)
Chloride: 107 mmol/L (ref 98–111)
GFR calc non Af Amer: >60 mL/min (ref >60)
Glucose, Bld: 101 mg/dL — ABNORMAL HIGH (ref 70–99)
Potassium: 4.9 mmol/L (ref 3.5–5.1)
SODIUM: 141 mmol/L (ref 135–145)

## 2017-10-21 LAB — CBC
HEMATOCRIT: 29.9 % — AB (ref 39.0–52.0)
HEMOGLOBIN: 9.5 g/dL — AB (ref 13.0–17.0)
MCH: 29.9 pg (ref 26.0–34.0)
MCHC: 31.8 g/dL (ref 30.0–36.0)
MCV: 94 fL (ref 78.0–100.0)
Platelets: 243 10*3/uL (ref 150–400)
RBC: 3.18 MIL/uL — ABNORMAL LOW (ref 4.22–5.81)
RDW: 15.9 % — ABNORMAL HIGH (ref 11.5–15.5)
WBC: 7.1 10*3/uL (ref 4.0–10.5)

## 2017-10-21 LAB — GLUCOSE, CAPILLARY: Glucose-Capillary: 93 mg/dL (ref 70–99)

## 2017-10-21 LAB — OCCULT BLOOD X 1 CARD TO LAB, STOOL: FECAL OCCULT BLD: POSITIVE — AB

## 2017-10-21 LAB — MAGNESIUM: Magnesium: 1.6 mg/dL — ABNORMAL LOW (ref 1.7–2.4)

## 2017-10-21 MED ORDER — LEVETIRACETAM IN NACL 1500 MG/100ML IV SOLN
1500.0000 mg | Freq: Two times a day (BID) | INTRAVENOUS | Status: DC
  Administered 2017-10-21 – 2017-10-23 (×4): 1500 mg via INTRAVENOUS
  Filled 2017-10-21 (×5): qty 100

## 2017-10-21 MED ORDER — METOPROLOL TARTRATE 25 MG PO TABS
25.0000 mg | ORAL_TABLET | Freq: Two times a day (BID) | ORAL | Status: DC
  Filled 2017-10-21: qty 1

## 2017-10-21 MED ORDER — LEVETIRACETAM 750 MG PO TABS
1500.0000 mg | ORAL_TABLET | Freq: Two times a day (BID) | ORAL | Status: DC
  Filled 2017-10-21: qty 2

## 2017-10-21 MED ORDER — MAGNESIUM SULFATE 50 % IJ SOLN
3.0000 g | Freq: Once | INTRAVENOUS | Status: AC
  Administered 2017-10-21: 3 g via INTRAVENOUS
  Filled 2017-10-21: qty 6

## 2017-10-21 MED ORDER — SODIUM CHLORIDE 0.9 % IV SOLN
200.0000 mg | Freq: Two times a day (BID) | INTRAVENOUS | Status: DC
Start: 2017-10-21 — End: 2017-10-23
  Administered 2017-10-21 – 2017-10-22 (×4): 200 mg via INTRAVENOUS
  Filled 2017-10-21 (×5): qty 20

## 2017-10-21 MED ORDER — LISINOPRIL 5 MG PO TABS
7.5000 mg | ORAL_TABLET | Freq: Every day | ORAL | Status: DC
  Filled 2017-10-21: qty 1

## 2017-10-21 MED ORDER — LEVETIRACETAM 100 MG/ML PO SOLN
1500.0000 mg | Freq: Two times a day (BID) | ORAL | Status: DC
  Filled 2017-10-21: qty 15

## 2017-10-21 MED ORDER — LACOSAMIDE 200 MG PO TABS
200.0000 mg | ORAL_TABLET | Freq: Two times a day (BID) | ORAL | Status: DC
  Filled 2017-10-21: qty 1

## 2017-10-21 MED ORDER — METOPROLOL TARTRATE 5 MG/5ML IV SOLN
2.5000 mg | Freq: Four times a day (QID) | INTRAVENOUS | Status: DC
  Administered 2017-10-22 – 2017-10-23 (×5): 2.5 mg via INTRAVENOUS
  Filled 2017-10-21 (×5): qty 5

## 2017-10-21 MED ORDER — DEXTROSE-NACL 5-0.9 % IV SOLN
INTRAVENOUS | Status: DC
  Administered 2017-10-21: 15:00:00 via INTRAVENOUS

## 2017-10-21 MED ORDER — LISINOPRIL 2.5 MG PO TABS
2.5000 mg | ORAL_TABLET | Freq: Once | ORAL | Status: AC
  Administered 2017-10-21: 2.5 mg via ORAL
  Filled 2017-10-21: qty 1

## 2017-10-21 NOTE — Progress Notes (Signed)
PROGRESS NOTE    Kevin Humphrey  VHQ:[id number] DOB: Mar 12, 1951 DOA: 10/14/2017 PCP: [name], MD   Brief Narrative:  67 year old WM PMHx Anxiety, dementia, schizophrenia, seizure disorder, aspiration pneumonia, ileus/SBO S/P lap adhesiolysis 09/22/2017 (during previous admission ) discharged on 10/10/2017.  Also On that visit Tx for  acute respiratory failure with hypoxia, sepsis secondary aspiration pneumonia metabolic encephalopathy,  Presented from SNF due to multiple seizures that occurred on 10/14/2017.  Started on IV antiseizure medication.   10/17/2017: Patient seen and examined at his bedside.  He is somnolent and appears uncomfortable.  Unable to obtain history due to altered mental status.   10/18/2017: Seen and examined at bedside not interactive however does not appear to be in distress.  Called legal guardian [facility] and left a message.  Will attempt to call again tomorrow.   10/19/17: Arrived in pt's room and he appeared to be actively seizing. Per nurse, was having these generalized repetitive movements for possibly 15 minutes. 2 mg IV ativan given stat which seemed to abort it. Now appears to be in post ictal state. Minimally responsive but protecting his airways. Neurology consulted. Transferring patient to [facility] for possible continuous EEG.  Already on IV vimpat and IV keppra.    Subjective: 6/30  A/O x2 (does not know where).  Follows commands.  Negative CP, negative S OB, negative abdominal pain.   Assessment & Plan:   Principal Problem:   Convulsions/seizures ([place]) Active Problems:   Schizophrenia ([place])   Pneumonia   Anemia, chronic disease   Seizure ([place])   Dementia  Acute metabolic encephalopathy -most likely multifactorial seizure, schizophrenia, aspiration pneumonia? -CT head unremarkable  Schizophrenia -Currently not on medication for schizophrenia. PTA also does not reflect schizophrenic medication.   Aspiration pneumonia?/RML/RLL  pneumonia -  6/23 CXR RML/RLL infiltrates -Continue empiric antibiotics - Aspiration precautions - Head of bed> 35 degrees    Suspected breakthrough seizures -6/30 change IV Vimpat--> 200 mg BID -6/30 change IV Keppra--> 1500 mg BID - PRN Ativan breakthrough seizures. -Neurology consulted: Dr Tobias Alexander..  6/30 neurology signed off. - Schedule Establish Care appointment in 1 to 2 weeks [facility], breakthrough seizures -Seizure precautions    Dysphasia - 6/29 patient passed swallow evaluation dysphasia 1 thin liquid -Patient still not consuming adequate caloric intake.  D5-0.9% saline at 20ml/hr   Accelerated HTN - Metoprolol 25 mg BID -Lisinopril 7.5 mg daily   Chronic normocytic anemia (baseline HgB 10.3) -  No overt signs of bleeding Recent Labs  Lab 10/14/17 1834 10/15/17 0422 10/18/17 0654 10/19/17 0806 10/20/17 0330 10/21/17 0748  HGB 10.0* 10.3* 9.4* 10.4* 9.2* 9.5*  -Fecal occult pending  Hypokalemia -Resolved  Hypomagnesmia -Magnesium goal> 2 - Magnesium IV 3 g     Goals of care -6/30 social work consult: 1. Evaluate for discharge back to [facility] next 24 to 48 hours            2.  Ward of the state investigate, investigate into making patient comfort care     DVT prophylaxis: SCD Code Status: DNR Family Communication: None Disposition Plan: TBD   Consultants:  Neuro hospitalist    Procedures/Significant Events:  CXR   I have personally reviewed and interpreted all radiology studies and my findings are as above.  VENTILATOR SETTINGS:    Cultures   Antimicrobials: Anti-infectives (From admission, onward)   Start     Stop   10/17/17 1200  piperacillin-tazobactam (ZOSYN) IVPB 3.375 g  10/14/17 2200  levofloxacin (LEVAQUIN) IVPB 500 mg  Status:  Discontinued     10/17/17 1115       Devices    LINES / TUBES:     Continuous Infusions: . sodium chloride Stopped (10/20/17 1759)  .  dextrose 5 % and 0.45 % NaCl with KCl 10 mEq/L 75 mL/hr at 10/20/17 1448  . lacosamide (VIMPAT) IV 200 mg (10/21/17 0636)  . levETIRAcetam 1,500 mg (10/21/17 0511)  . piperacillin-tazobactam (ZOSYN)  IV 3.375 g (10/21/17 0418)     Objective: Vitals:   10/21/17 0410 10/21/17 0500 10/21/17 0547 10/21/17 0734  BP: (!) 152/66  (!) 180/83 (!) 187/103  Pulse: 91  87   Resp: 19  15   Temp: 98.4 F (36.9 C)   98.4 F (36.9 C)  TempSrc: Oral   Axillary  SpO2: 95%  98%   Weight:  161 lb 6 oz (73.2 kg)    Height:        Intake/Output Summary (Last 24 hours) at 10/21/2017 0910 Last data filed at 10/21/2017 0700 Gross per 24 hour  Intake 2187.54 ml  Output 200 ml  Net 1987.54 ml   Filed Weights   10/14/17 2318 10/20/17 0350 10/21/17 0500  Weight: 150 lb 2.1 oz (68.1 kg) 161 lb 2.5 oz (73.1 kg) 161 lb 6 oz (73.2 kg)    Examination:  General:  A/O x2 (does not know where).  Follows commands.negative acute respiratory distress.   Neck: Negative scars, masses, torticollis, lymphadenopathy, JVD  Lungs: clear to auscultation bilaterally, negative wheeze or crackles  Cardiovascular: Irregular rhythm and rate, negative murmurs rubs or gallops, normal S1/S2  Abdomen: Negative abdominal pain, nondistended, positive soft bowel sounds negative ascites  Extremities: No significant cyanosis, clubbing, or edema bilateral lower extremities Skin: Negative rashes, lesions, ulcers Psychiatric: Unable to evaluate secondary to dementia  Vs delirium  Vs mental delay  Vs schizophrenia  Central nervous system:  Cranial nerves II through XII intact, tongue/uvula midline, moves all extremities to command.  Repetitive speech.  Positive  dysarthria, negative expressive aphasia, negative receptive aphasia.  .     Data Reviewed: Care during the described time interval was provided by me .  I have reviewed this patient's available data, including medical history, events of note, physical examination, and all  test results as part of my evaluation.   CBC: Recent Labs  Lab 10/14/17 1834 10/15/17 0422 10/18/17 0654 10/19/17 0806 10/20/17 0330 10/21/17 0748  WBC 12.7* 13.3* 4.1 8.6 4.7 7.1  NEUTROABS 9.7*  --   --   --   --   --   HGB 10.0* 10.3* 9.4* 10.4* 9.2* 9.5*  HCT 30.7* 31.0* 29.2* 32.0* 29.3* 29.9*  MCV 95.6 93.1 94.8 93.6 94.8 94.0  PLT 437* 415* 204 232 191 384   Basic Metabolic Panel: Recent Labs  Lab 10/14/17 1834 10/15/17 0422 10/18/17 0654 10/19/17 0806 10/20/17 0330 10/20/17 1536  NA 139 140 139 138 139  --   K 3.8 3.7 3.2* 3.0* 2.9* 2.8*  CL 106 107 107 104 105  --   CO2 25 22 24 24 28   --   GLUCOSE 86 75 100* 123* 112*  --   BUN 15 14 6* <5* <5*  --   CREATININE 0.69 0.66 0.66 0.60* 0.61  --   CALCIUM 8.5* 8.5* 8.3* 8.5* 8.2*  --   MG 1.7  --   --   --  1.4* 1.9  PHOS 2.1*  --   --   --  2.5  --    GFR: Estimated Creatinine Clearance: 91.5 mL/min (by C-G formula based on SCr of 0.61 mg/dL). Liver Function Tests: Recent Labs  Lab 10/14/17 1834 10/18/17 0654 10/20/17 0330  AST 17 24 106*  ALT 15* 16 14  ALKPHOS 44 42 41  BILITOT 1.1 0.7 0.6  PROT 6.8 6.2* 5.5*  ALBUMIN 3.0* 2.6* 2.4*   No results for input(s): LIPASE, AMYLASE in the last 168 hours. No results for input(s): AMMONIA in the last 168 hours. Coagulation Profile: No results for input(s): INR, PROTIME in the last 168 hours. Cardiac Enzymes: No results for input(s): CKTOTAL, CKMB, CKMBINDEX, TROPONINI in the last 168 hours. BNP (last 3 results) No results for input(s): PROBNP in the last 8760 hours. HbA1C: No results for input(s): HGBA1C in the last 72 hours. CBG: Recent Labs  Lab 10/21/17 0100  GLUCAP 93   Lipid Profile: No results for input(s): CHOL, HDL, LDLCALC, TRIG, CHOLHDL, LDLDIRECT in the last 72 hours. Thyroid Function Tests: No results for input(s): TSH, T4TOTAL, FREET4, T3FREE, THYROIDAB in the last 72 hours. Anemia Panel: No results for input(s): VITAMINB12,  FOLATE, FERRITIN, TIBC, IRON, RETICCTPCT in the last 72 hours. Urine analysis:    Component Value Date/Time   COLORURINE YELLOW 10/14/2017 [place] 10/14/2017 1650   LABSPEC 1.020 10/14/2017 1650   PHURINE 5.0 10/14/2017 1650   GLUCOSEU 50 (A) 10/14/2017 1650   HGBUR MODERATE (A) 10/14/2017 1650   BILIRUBINUR NEGATIVE 10/14/2017 1650   KETONESUR 5 (A) 10/14/2017 1650   PROTEINUR NEGATIVE 10/14/2017 1650   UROBILINOGEN 0.2 12/20/2014 1157   NITRITE NEGATIVE 10/14/2017 1650   LEUKOCYTESUR NEGATIVE 10/14/2017 1650   Sepsis Labs: @LABRCNTIP (procalcitonin:4,lacticidven:4)  ) Recent Results (from the past 240 hour(s))  Urine culture     Status: None   Collection Time: 10/14/17  8:18 PM  Result Value Ref Range Status   Specimen Description   Final    URINE, RANDOM Performed at [facility], [place] [address] [place], [place] [address]    Special Requests   Final    NONE Performed at [facility], [place] [address] [place], [place] [address]    Culture   Final    NO GROWTH Performed at [facility], [place] [address], [place], [place] [address]    Report Status 10/16/2017 FINAL  Final  MRSA PCR Screening     Status: None   Collection Time: 10/15/17 10:47 AM  Result Value Ref Range Status   MRSA by PCR NEGATIVE NEGATIVE Final    Comment:        The GeneXpert MRSA Assay (FDA approved for NASAL specimens only), is one component of a comprehensive MRSA colonization surveillance program. It is not intended to diagnose MRSA infection nor to guide or monitor treatment for MRSA infections. Performed at [facility], [place] [address], [place], [place] [address]          Radiology Studies: No results found.      Scheduled Meds: . chlorhexidine  15 mL Mouth Rinse BID  . feeding supplement (ENSURE ENLIVE)  237 mL Oral BID BM  . ferrous sulfate  325 mg Oral Daily  . guaiFENesin  400 mg Oral TID    . labetalol  5 mg Intravenous Q6H  . lisinopril  5 mg Oral Daily  . mouth rinse  15 mL Mouth Rinse q12n4p  . multivitamin with minerals  1 tablet Oral Daily  . potassium chloride  40 mEq Oral BID  . potassium chloride  40 mEq Oral Once  . [START ON 11/05/2017] Vitamin D (Ergocalciferol)  50,000 Units Oral Q30 days   Continuous Infusions: . sodium chloride Stopped (10/20/17 1759)  . dextrose 5 % and 0.45 % NaCl with KCl 10 mEq/L 75 mL/hr at 10/20/17 1448  . lacosamide (VIMPAT) IV 200 mg (10/21/17 0636)  . levETIRAcetam 1,500 mg (10/21/17 0511)  . piperacillin-tazobactam (ZOSYN)  IV 3.375 g (10/21/17 0418)     LOS: 5 days    Time spent: 40 minutes    WOODS, Geraldo Docker, MD [facility] Pager [phone]   If 7PM-7AM, please contact night-coverage [email or web address] Password [email or web address] 10/21/2017, 9:10 AM

## 2017-10-21 NOTE — Progress Notes (Signed)
Pt was repeating phrases over and over and eyes were all over the place but not jerkin gpaged neurology, came and saw pt and said it was more agitation. RN will page hospitalist to get some medication for agitation

## 2017-10-21 NOTE — Progress Notes (Signed)
Pt desat  when  Remove [place] or when talking. occasional restlessness and agitation noted.  Bathed, mouth care and reposition pt calm down a bit. 0430 pt stated Idon't feel  Good CBG was checked and was normal  . Nurse asked pt if  He feels nauseated, pt nod's  Head, Zofran 4 mg given with some effect noted later, evidence by pt being calm and resting. All IV  KCL run completed. No acute distress at this time. RN will continue to monitor.

## 2017-10-21 NOTE — Progress Notes (Signed)
Subjective: Continues to improve.  He still has significant repetitive speech, but is able to communicate and attend. At baseline, he can usually make his needs known with his repetitive speech.   ROS: unable d/t baseline dementia  Exam: Vitals:   10/21/17 0547 10/21/17 0734  BP: (!) 180/83 (!) 187/103  Pulse: 87   Resp: 15   Temp:  98.4 F (36.9 C)  SpO2: 98%    Gen: In bed, NAD Resp: non-labored breathing, no acute distress Abd: soft, non tender  Neuro: MS: Awake, alert, attends and tracks, follow commands. perseverative repetitive infantile speech CN: He fixates and tracks me across midline in both directions, PERRL, 43mm Motor: He was extremity spontaneously Sensory: Response to stimulation in all 4 extremities  Pertinent Labs: Hypomagnesemia at 1.6  Impression: 67 year old male with a history of schizophrenia, dementia recently treated for seizure disorder as an inpatient who presented again with recurrent seizures in the setting of refusal of medications.  He had a recurrent seizure on 6/28 in the setting of pneumonia. Hestate to add a third agent since he has a good reason for a breakthrough seizure and is now imprving. If he were to have any further episodes then a third agent would be necessary.  Recommendations: 1) continue Keppra 1500 mg twice daily and Vimpat 200 mg twice daily 2) continue to replace magnesium; hypomagnesemia can contribute to breakthrough seizures, this may have been a contributor to seizure on 6/28 3) he will need to continue with out pt neuro f/u. neurology will sign off. Please call back if needed  Shaddai Shapley Metzger-Cihelka, ARNP-C [facility]

## 2017-10-22 LAB — CBC
HEMATOCRIT: 30.9 % — AB (ref 39.0–52.0)
Hemoglobin: 9.7 g/dL — ABNORMAL LOW (ref 13.0–17.0)
MCH: 30 pg (ref 26.0–34.0)
MCHC: 31.4 g/dL (ref 30.0–36.0)
MCV: 95.7 fL (ref 78.0–100.0)
Platelets: 173 10*3/uL (ref 150–400)
RBC: 3.23 MIL/uL — ABNORMAL LOW (ref 4.22–5.81)
RDW: 16 % — AB (ref 11.5–15.5)
WBC: 8.2 10*3/uL (ref 4.0–10.5)

## 2017-10-22 LAB — BASIC METABOLIC PANEL
Anion gap: 13 (ref 5–15)
BUN: <5 mg/dL — ABNORMAL LOW (ref 8–23)
CHLORIDE: 104 mmol/L (ref 98–111)
CO2: 23 mmol/L (ref 22–32)
Calcium: 8.1 mg/dL — ABNORMAL LOW (ref 8.9–10.3)
Creatinine, Ser: 0.67 mg/dL (ref 0.61–1.24)
GFR calc non Af Amer: >60 mL/min (ref >60)
Glucose, Bld: 116 mg/dL — ABNORMAL HIGH (ref 70–99)
POTASSIUM: 4.6 mmol/L (ref 3.5–5.1)
SODIUM: 140 mmol/L (ref 135–145)

## 2017-10-22 LAB — MAGNESIUM: MAGNESIUM: 1.9 mg/dL (ref 1.7–2.4)

## 2017-10-22 MED ORDER — FUROSEMIDE 10 MG/ML IJ SOLN
20.0000 mg | Freq: Two times a day (BID) | INTRAMUSCULAR | Status: AC
  Administered 2017-10-22 (×2): 20 mg via INTRAVENOUS
  Filled 2017-10-22 (×2): qty 4

## 2017-10-22 MED ORDER — LORAZEPAM 2 MG/ML IJ SOLN
1.0000 mg | Freq: Once | INTRAMUSCULAR | Status: AC
  Administered 2017-10-22: 1 mg via INTRAMUSCULAR

## 2017-10-22 NOTE — Progress Notes (Signed)
CSW following for discharge plan. Patient is a recent readmission, last assessment completed on 09/25/17 (see below). Patient is a ward of the state, legal guardian contact information is on patient's chart. Patient is long term care from [place].  CSW to follow.  Laveda Abbe, [place] Clinical Social Worker [phone]     Clinical Social Work Assessment  Patient Details Name:Kevin Humphrey VVZ:[id number] Date of Birth:09/28/1950  Date of referral:09/25/17 Reason for consult:Facility Placement  Permission sought to share information with:Family Supports Permission granted to share information::  Name::  Agency::[facility] Relationship::Legal Guardian Contact Information:  Housing/Transportation Living arrangements for the past 2 months:Skilled [place] of Information:Facility Patient Interpreter Needed:None Criminal Activity/Legal Involvement Pertinent to Current Situation/Hospitalization:No - Comment as needed Significant Relationships:Community Support Lives with:Facility Resident Do you feel safe going back to the place where you live?Yes Need for family participation in patient care:Yes(DSS is her legal guardian)  Care giving concerns: No care giving concerns a this time.  Social Worker assessment / plan:CSW spoke with the patient LG-Jewel. Patient is a ward of state and currently resides at [facility]. Patient will return to SNF at discharge. Jewel request patient discharge summary be fax to: [phone].  CSW spoke with patient nurse Maudie Mercury at [facility] for collateral information. She reports the patient is limited with allactivitiesof daily living.She reports the patient can transfer to his wheel chair. She reports the patient yells out and can be agitated at times. The facility will accept patient at discharge.    CSW will assist with patient return to SNF and update LG at discharge.   Plan: SNF  Employment status:Disabled (Comment on whether or not currently receiving Disability) Insurance information:Managed Medicare [place] / Referral to community resources:Skilled Nursing Facility  Patient/Family's Response to care:No family at bedside.  Patient/Family's Understanding of and Emotional Response to Diagnosis, Current Treatment, and Prognosis:No family at bedside.  Emotional Assessment Appearance:Appears stated age Attitude/Demeanor/Rapport:  Affect (typically observed):Unable to Assess Orientation:Oriented to Self Alcohol / Substance use:Not Applicable Psych involvement (Current and /or in the community):No (Comment)  Discharge Needs  Concerns to be addressed:Discharge Planning Concerns Readmission within the last 30 days:No Current discharge risk:Dependent with Mobility, Psychiatric Illness Barriers to Discharge:Continued Medical Work up   [employer], LCSW 09/25/2017,1:27 PM

## 2017-10-22 NOTE — NC FL2 (Signed)
[place] LEVEL OF CARE SCREENING TOOL     IDENTIFICATION  Patient Name: Kevin Humphrey Birthdate: [birth year] Sex: male Admission Date (Current Location): 10/14/2017  [facility] and [place] Number:  Herbalist and Address:  [facility], [place] [address], [place], [place] [address]      Provider Number: [id number]  Attending Physician Name and Address:  Domenic Polite, MD  Relative Name and Phone Number:       Current Level of Care: Hospital Recommended Level of Care: [place] Prior Approval Number:    Date Approved/Denied:   PASRR Number:    Discharge Plan: SNF    Current Diagnoses: Patient Active Problem List   Diagnosis Date Noted  . Seizure ([place]) 10/14/2017  . Dementia 10/14/2017  . Agitation   . Decreased oral intake   . Hyperammonemia ([place])   . Volume overload 09/24/2017  . Hypokalemia   . Acute metabolic encephalopathy 81/19/1478  . Ileus ([place]) 09/23/2017  . Acute lower UTI 09/22/2017  . Anxiety state 09/21/2017  . Small bowel obstruction ([place])   . Hypomagnesemia   . SBO (small bowel obstruction) s/p lap adhesiolysis 09/22/2017 09/20/2017  . ARF (acute renal failure) ([place]) 09/20/2017  . Hematemesis 09/20/2017  . Sepsis ([place]) 09/20/2017  . HCAP (healthcare-associated pneumonia)   . Ventilator dependent ([place])   . Acute cystitis with hematuria   . Bacteremia   . Accelerated hypertension   . Anemia, chronic disease   . Acute kidney injury ([place])   . Hypernatremia   . Protein-calorie malnutrition ([place])   . Acute encephalopathy   . Palliative care encounter 12/17/2014  . Dysphagia 12/17/2014  . Acute respiratory failure with hypoxia ([place]) 12/14/2014  . Staphylococcal pneumonia ([place]) 12/14/2014  . Septic shock ([place]) 12/14/2014  . AKI (acute kidney injury) ([place]) 12/14/2014  . Respiratory failure ([place])   . Pneumonia 12/12/2014  . Malnutrition of moderate degree ([place]) 12/12/2014  . Pressure ulcer  12/12/2014  . Convulsions/seizures ([place]) 08/20/2013  . Other and unspecified hyperlipidemia 08/02/2012  . GERD (gastroesophageal reflux disease) 08/02/2012  . Schizophrenia ([place]) 08/02/2012  . Depression 08/02/2012  . Allergic rhinitis 08/02/2012  . Dysphagia, unspecified(787.20) 08/02/2012  . Vertebral compression fracture (H[facility]) 08/02/2012    Orientation RESPIRATION BLADDER Height & Weight     Self  O2(see DC summary) Incontinent, Indwelling catheter Weight: 161 lb 6 oz (73.2 kg) Height:  5\' 11"  (180.3 cm)  BEHAVIORAL SYMPTOMS/MOOD NEUROLOGICAL BOWEL NUTRITION STATUS    Convulsions/Seizures Incontinent    AMBULATORY STATUS COMMUNICATION OF NEEDS Skin   Total Care Verbally Skin abrasions                       Personal Care Assistance Level of Assistance  Bathing, Feeding, Dressing Bathing Assistance: Maximum assistance Feeding assistance: Maximum assistance Dressing Assistance: Maximum assistance     Functional Limitations Info  Sight, Hearing, Speech Sight Info: Adequate Hearing Info: Adequate Speech Info: Adequate    SPECIAL CARE FACTORS FREQUENCY  Speech therapy             Speech Therapy Frequency: 5x/wk      Contractures Contractures Info: Not present    Additional Factors Info  Code Status, Allergies, Isolation Precautions Code Status Info: DNR Allergies Info: NKA     Isolation Precautions Info: Contact, MRSA     Current Medications (10/22/2017):  This is the current hospital active medication list Current Facility-Administered Medications  Medication Dose Route Frequency  Provider Last Rate Last Dose  . 0.9 %  sodium chloride infusion   Intravenous PRN Allie Bossier, MD 10 mL/hr at 10/21/17 2215    . acetaminophen (TYLENOL) suppository 650 mg  650 mg Rectal Q4H PRN Allie Bossier, MD   650 mg at 10/17/17 0514  . acetaminophen (TYLENOL) tablet 650 mg  650 mg Oral Q6H PRN Allie Bossier, MD      . bisacodyl (DULCOLAX) suppository 10 mg  10 mg  Rectal PRN Allie Bossier, MD      . chlorhexidine (PERIDEX) 0.12 % solution 15 mL  15 mL Mouth Rinse BID Allie Bossier, MD   15 mL at 10/21/17 0847  . dextrose 5 %-0.9 % sodium chloride infusion   Intravenous Continuous Allie Bossier, MD 10 mL/hr at 10/22/17 1125    . feeding supplement (ENSURE ENLIVE) (ENSURE ENLIVE) liquid 237 mL  237 mL Oral BID BM Allie Bossier, MD   237 mL at 10/21/17 0844  . ferrous sulfate tablet 325 mg  325 mg Oral Daily Allie Bossier, MD      . guaiFENesin (ROBITUSSIN) 100 MG/5ML solution 400 mg  400 mg Oral TID Allie Bossier, MD   400 mg at 10/19/17 2234  . hydrALAZINE (APRESOLINE) injection 10 mg  10 mg Intravenous Q6H PRN Allie Bossier, MD   10 mg at 10/17/17 0515  . ipratropium-albuterol (DUONEB) 0.5-2.5 (3) MG/3ML nebulizer solution 3 mL  3 mL Nebulization Q6H PRN Allie Bossier, MD      . lacosamide (VIMPAT) 200 mg in sodium chloride 0.9 % 25 mL IVPB  200 mg Intravenous Q12H Allie Bossier, MD 90 mL/hr at 10/22/17 1121 200 mg at 10/22/17 1121  . levETIRAcetam (KEPPRA) IVPB 1500 mg/ 100 mL premix  1,500 mg Intravenous Q12H Allie Bossier, MD 400 mL/hr at 10/22/17 0205 1,500 mg at 10/22/17 0205  . lisinopril (PRINIVIL,ZESTRIL) tablet 7.5 mg  7.5 mg Oral Daily Allie Bossier, MD      . LORazepam (ATIVAN) injection 2 mg  2 mg Intravenous Q4H PRN Allie Bossier, MD   2 mg at 10/20/17 0537  . MEDLINE mouth rinse  15 mL Mouth Rinse q12n4p Allie Bossier, MD   15 mL at 10/19/17 1730  . metoprolol tartrate (LOPRESSOR) injection 2.5 mg  2.5 mg Intravenous Q6H Kirby-Graham, Karsten Fells, NP   2.5 mg at 10/22/17 0618  . multivitamin with minerals tablet 1 tablet  1 tablet Oral Daily Allie Bossier, MD   1 tablet at 10/21/17 [phone]  . ondansetron (ZOFRAN) tablet 4 mg  4 mg Oral Q6H PRN Allie Bossier, MD       Or  . ondansetron [facility]) injection 4 mg  4 mg Intravenous Q6H PRN Allie Bossier, MD   4 mg at 10/21/17 2347  . ondansetron (ZOFRAN) tablet 4 mg  4 mg  Oral Q8H PRN Allie Bossier, MD      . piperacillin-tazobactam (ZOSYN) IVPB 3.375 g  3.375 g Intravenous Q8H Allie Bossier, MD 12.5 mL/hr at 10/22/17 0627 3.375 g at 10/22/17 0627  . potassium chloride (KLOR-CON) packet 40 mEq  40 mEq Oral BID Allie Bossier, MD   40 mEq at 10/20/17 1555  . potassium chloride SA (K-DUR,KLOR-CON) CR tablet 40 mEq  40 mEq Oral Once Allie Bossier, MD      . Derrill Memo ON 11/05/2017] Vitamin D (Ergocalciferol) (DRISDOL) capsule 50,000 Units  50,000 Units  Oral Q30 days Allie Bossier, MD         Discharge Medications: Please see discharge summary for a list of discharge medications.  Relevant Imaging Results:  Relevant Lab Results:   Additional Information SS#: [id number]  Geralynn Ochs, LCSW

## 2017-10-22 NOTE — Plan of Care (Signed)
  Problem: Education: Goal: Knowledge of General Education information will improve Outcome: Not Progressing   Problem: Clinical Measurements: Goal: Ability to maintain clinical measurements within normal limits will improve Outcome: Not Progressing Goal: Will remain free from infection Outcome: Not Progressing Goal: Diagnostic test results will improve Outcome: Not Progressing Goal: Respiratory complications will improve Outcome: Not Progressing Goal: Cardiovascular complication will be avoided Outcome: Not Progressing   Problem: Activity: Goal: Risk for activity intolerance will decrease Outcome: Not Progressing   Problem: Nutrition: Goal: Adequate nutrition will be maintained Outcome: Not Progressing   Problem: Coping: Goal: Level of anxiety will decrease Outcome: Not Progressing   Problem: Elimination: Goal: Will not experience complications related to bowel motility Outcome: Not Progressing Goal: Will not experience complications related to urinary retention Outcome: Not Progressing   Problem: Pain Managment: Goal: General experience of comfort will improve Outcome: Not Progressing   Problem: Safety: Goal: Ability to remain free from injury will improve Outcome: Not Progressing   Problem: Skin Integrity: Goal: Risk for impaired skin integrity will decrease Outcome: Not Progressing

## 2017-10-22 NOTE — Progress Notes (Signed)
PROGRESS NOTE    Kevin Humphrey  UVJ:[id number] DOB: 14-Jun-1950 DOA: 10/14/2017 PCP: [name], MD  Brief Narrative: 67 year old WM PMHx Anxiety, dementia, schizophrenia, seizure disorder, aspiration pneumonia, ileus/SBO S/P lap adhesiolysis 09/22/2017 (during previous admission ) discharged on 10/10/2017.  Also On that visit Tx for  acute respiratory failure with hypoxia, sepsis secondary aspiration pneumonia metabolic encephalopathy, Presented from SNF due to multiple seizures that occurred on 10/14/2017. Started on IV antiseizure medication. -seizures controlled on antiepileptics, mentation fluctuates, also treated for suspected aspiration pneumonia -Due to dementia, schizophrenia, seizure disorder, AEDs-mentation is poor at baseline, often confused, yells out and agitated at times, total care at [facility],  -transferred from [place] to my service today 7/1  Assessment & Plan:     Breakthrough seizures - Poor compliance, had been refusing antiepileptics for several days at SNF - Minimal to no by mouth intake as well -Continue current regimen of antiepileptics-Keppra and Vimpat -monitor and treat electrolytes   Metabolic encephalopathy - Due to seizures in the background of dementia, schizophrenia -CT head unremarkable -More awake, and alert -Refuses meals and Po medications per staff -will update legal guardian   Dysphagia -See below   Aspiration pneumonia -Chest x-ray 6/23 showed right middle lobe and lower lobe infiltrates -initially treated with levofloxacin from 6/23, now on IV Zosyn since 6/26 -We'll stop Zosyn after tomorrow's dose -Aspiration precautions, FLP following, dysphagia 1 diet -Stopped IV fluids since he is a 11 L positive  Accelerated hypertension -continue metoprolol and lisinopril -appears volume overloaded 2, IV Lasix 2 doses    Schizophrenia ([place]) -per chart review, not on psychotropic meds at baseline -Not appropriate to start  now   DVT prophylaxis: SCds due to hemoccult postive, no overt bleeding, start lovenox in 48hrs if hb stable Code Status: DNR Family Communication:no family, will attempt to contact legal guardian Disposition Plan:  Back to SNF  Consultants:   neurology   Procedures:   Antimicrobials:    Subjective: -continues to refuse by mouth meds and diet, keeps repeating the same to 3 phrases throughout  Objective: Vitals:   10/22/17 0400 10/22/17 0756 10/22/17 0820 10/22/17 1148  BP: (!) 208/98 (!) 167/99  (!) 150/84  Pulse: (!) 101 (!) 47    Resp: 15 (!) 28    Temp: 98.6 F (37 C) (!) 97.4 F (36.3 C) 98.6 F (37 C)   TempSrc: Axillary Axillary    SpO2: (!) 78% 91%    Weight:      Height:        Intake/Output Summary (Last 24 hours) at 10/22/2017 1256 Last data filed at 10/22/2017 1125 Gross per 24 hour  Intake 236.99 ml  Output 850 ml  Net -613.01 ml   Filed Weights   10/14/17 2318 10/20/17 0350 10/21/17 0500  Weight: 68.1 kg (150 lb 2.1 oz) 73.1 kg (161 lb 2.5 oz) 73.2 kg (161 lb 6 oz)    Examination:  General exam: awake, alert, unable to assess orientation, follows few commands Respiratory system: few rhonchi at bases Cardiovascular system: S1 & S2 heard, RRR. Gastrointestinal system: Abdomen is nondistended, soft and nontender.Normal bowel sounds heard. Central nervous system: moves all extremities, somewhat confused Extremities: no edema Skin: No rashes, lesions or ulcers Psychiatry: confused, unable to assess    Data Reviewed:   CBC: Recent Labs  Lab 10/18/17 0654 10/19/17 0806 10/20/17 0330 10/21/17 0748 10/22/17 0651  WBC 4.1 8.6 4.7 7.1 8.2  HGB 9.4* 10.4* 9.2* 9.5* 9.7*  HCT  29.2* 32.0* 29.3* 29.9* 30.9*  MCV 94.8 93.6 94.8 94.0 95.7  PLT 204 232 191 243 983   Basic Metabolic Panel: Recent Labs  Lab 10/18/17 0654 10/19/17 0806 10/20/17 0330 10/20/17 1536 10/21/17 0748 10/22/17 0651  NA 139 138 139  --  141 140  K 3.2* 3.0* 2.9* 2.8*  4.9 4.6  CL 107 104 105  --  107 104  CO2 24 24 28   --  28 23  GLUCOSE 100* 123* 112*  --  101* 116*  BUN 6* <5* <5*  --  <5* <5*  CREATININE 0.66 0.60* 0.61  --  0.75 0.67  CALCIUM 8.3* 8.5* 8.2*  --  8.2* 8.1*  MG  --   --  1.4* 1.9 1.6* 1.9  PHOS  --   --  2.5  --   --   --    GFR: Estimated Creatinine Clearance: 91.5 mL/min (by C-G formula based on SCr of 0.67 mg/dL). Liver Function Tests: Recent Labs  Lab 10/18/17 0654 10/20/17 0330  AST 24 106*  ALT 16 14  ALKPHOS 42 41  BILITOT 0.7 0.6  PROT 6.2* 5.5*  ALBUMIN 2.6* 2.4*   No results for input(s): LIPASE, AMYLASE in the last 168 hours. No results for input(s): AMMONIA in the last 168 hours. Coagulation Profile: No results for input(s): INR, PROTIME in the last 168 hours. Cardiac Enzymes: No results for input(s): CKTOTAL, CKMB, CKMBINDEX, TROPONINI in the last 168 hours. BNP (last 3 results) No results for input(s): PROBNP in the last 8760 hours. HbA1C: No results for input(s): HGBA1C in the last 72 hours. CBG: Recent Labs  Lab 10/21/17 0100  GLUCAP 93   Lipid Profile: No results for input(s): CHOL, HDL, LDLCALC, TRIG, CHOLHDL, LDLDIRECT in the last 72 hours. Thyroid Function Tests: No results for input(s): TSH, T4TOTAL, FREET4, T3FREE, THYROIDAB in the last 72 hours. Anemia Panel: No results for input(s): VITAMINB12, FOLATE, FERRITIN, TIBC, IRON, RETICCTPCT in the last 72 hours. Urine analysis:    Component Value Date/Time   COLORURINE YELLOW 10/14/2017 [place] 10/14/2017 1650   LABSPEC 1.020 10/14/2017 1650   PHURINE 5.0 10/14/2017 1650   GLUCOSEU 50 (A) 10/14/2017 1650   HGBUR MODERATE (A) 10/14/2017 1650   BILIRUBINUR NEGATIVE 10/14/2017 1650   KETONESUR 5 (A) 10/14/2017 1650   PROTEINUR NEGATIVE 10/14/2017 1650   UROBILINOGEN 0.2 12/20/2014 1157   NITRITE NEGATIVE 10/14/2017 1650   LEUKOCYTESUR NEGATIVE 10/14/2017 1650   Sepsis  Labs: @LABRCNTIP (procalcitonin:4,lacticidven:4)  ) Recent Results (from the past 240 hour(s))  Urine culture     Status: None   Collection Time: 10/14/17  8:18 PM  Result Value Ref Range Status   Specimen Description   Final    URINE, RANDOM Performed at [facility], [place] [address] [place], [place] [address]    Special Requests   Final    NONE Performed at [facility], [place] [address] [place], [place] [address]    Culture   Final    NO GROWTH Performed at [facility], [place] [address], [place], [place] [address]    Report Status 10/16/2017 FINAL  Final  MRSA PCR Screening     Status: None   Collection Time: 10/15/17 10:47 AM  Result Value Ref Range Status   MRSA by PCR NEGATIVE NEGATIVE Final    Comment:        The GeneXpert MRSA Assay (FDA approved for NASAL specimens only), is one component  of a comprehensive MRSA colonization surveillance program. It is not intended to diagnose MRSA infection nor to guide or monitor treatment for MRSA infections. Performed at [facility], [place] [address], [place], [place] [address]          Radiology Studies: No results found.      Scheduled Meds: . chlorhexidine  15 mL Mouth Rinse BID  . feeding supplement (ENSURE ENLIVE)  237 mL Oral BID BM  . ferrous sulfate  325 mg Oral Daily  . guaiFENesin  400 mg Oral TID  . lisinopril  7.5 mg Oral Daily  . mouth rinse  15 mL Mouth Rinse q12n4p  . metoprolol tartrate  2.5 mg Intravenous Q6H  . multivitamin with minerals  1 tablet Oral Daily  . potassium chloride  40 mEq Oral BID  . potassium chloride  40 mEq Oral Once  . [START ON 11/05/2017] Vitamin D (Ergocalciferol)  50,000 Units Oral Q30 days   Continuous Infusions: . sodium chloride 10 mL/hr at 10/21/17 2215  . dextrose 5 % and 0.9% NaCl 10 mL/hr at 10/22/17 1125  . lacosamide (VIMPAT) IV 200 mg (10/22/17 1121)  . levETIRAcetam 1,500 mg (10/22/17 0205)  .  piperacillin-tazobactam (ZOSYN)  IV 3.375 g (10/22/17 1150)     LOS: 6 days    Time spent: 31min    Domenic Polite, MD [facility] Page via [email or web address], password [email or web address] After 7PM please contact night-coverage  10/22/2017, 12:56 PM

## 2017-10-23 LAB — BASIC METABOLIC PANEL
ANION GAP: 9 (ref 5–15)
BUN: <5 mg/dL — ABNORMAL LOW (ref 8–23)
CALCIUM: 8.5 mg/dL — AB (ref 8.9–10.3)
CO2: 37 mmol/L — ABNORMAL HIGH (ref 22–32)
CREATININE: 0.76 mg/dL (ref 0.61–1.24)
Chloride: 94 mmol/L — ABNORMAL LOW (ref 98–111)
GFR calc Af Amer: >60 mL/min (ref >60)
GLUCOSE: 83 mg/dL (ref 70–99)
Potassium: 3 mmol/L — ABNORMAL LOW (ref 3.5–5.1)
Sodium: 140 mmol/L (ref 135–145)

## 2017-10-23 LAB — CBC
HCT: 31.3 % — ABNORMAL LOW (ref 39.0–52.0)
Hemoglobin: 9.8 g/dL — ABNORMAL LOW (ref 13.0–17.0)
MCH: 29.5 pg (ref 26.0–34.0)
MCHC: 31.3 g/dL (ref 30.0–36.0)
MCV: 94.3 fL (ref 78.0–100.0)
PLATELETS: 249 10*3/uL (ref 150–400)
RBC: 3.32 MIL/uL — AB (ref 4.22–5.81)
RDW: 16 % — AB (ref 11.5–15.5)
WBC: 7.4 10*3/uL (ref 4.0–10.5)

## 2017-10-23 LAB — MAGNESIUM: Magnesium: 1.5 mg/dL — ABNORMAL LOW (ref 1.7–2.4)

## 2017-10-23 MED ORDER — METOPROLOL TARTRATE 25 MG PO TABS
25.0000 mg | ORAL_TABLET | Freq: Two times a day (BID) | ORAL | Status: DC
  Filled 2017-10-23: qty 1

## 2017-10-23 MED ORDER — LEVETIRACETAM IN NACL 1500 MG/100ML IV SOLN
1500.0000 mg | Freq: Two times a day (BID) | INTRAVENOUS | Status: DC
  Administered 2017-10-23 – 2017-10-24 (×3): 1500 mg via INTRAVENOUS
  Filled 2017-10-23 (×3): qty 100

## 2017-10-23 MED ORDER — METOPROLOL TARTRATE 5 MG/5ML IV SOLN
5.0000 mg | Freq: Four times a day (QID) | INTRAVENOUS | Status: DC
  Administered 2017-10-23 – 2017-10-24 (×5): 5 mg via INTRAVENOUS
  Filled 2017-10-23 (×4): qty 5

## 2017-10-23 MED ORDER — FAMOTIDINE IN NACL 20-0.9 MG/50ML-% IV SOLN
20.0000 mg | Freq: Two times a day (BID) | INTRAVENOUS | Status: DC
  Administered 2017-10-23 – 2017-10-24 (×3): 20 mg via INTRAVENOUS
  Filled 2017-10-23 (×2): qty 50

## 2017-10-23 MED ORDER — POTASSIUM CHLORIDE 10 MEQ/100ML IV SOLN
10.0000 meq | INTRAVENOUS | Status: AC
  Administered 2017-10-23 (×6): 10 meq via INTRAVENOUS
  Filled 2017-10-23 (×6): qty 100

## 2017-10-23 MED ORDER — LACOSAMIDE 200 MG PO TABS
200.0000 mg | ORAL_TABLET | Freq: Two times a day (BID) | ORAL | Status: DC
  Filled 2017-10-23: qty 1

## 2017-10-23 MED ORDER — PANTOPRAZOLE SODIUM 40 MG PO TBEC: 40.0000 mg | DELAYED_RELEASE_TABLET | Freq: Every day | ORAL | Status: DC

## 2017-10-23 MED ORDER — FUROSEMIDE 10 MG/ML IJ SOLN
20.0000 mg | Freq: Two times a day (BID) | INTRAMUSCULAR | Status: AC
  Administered 2017-10-23 (×2): 20 mg via INTRAVENOUS
  Filled 2017-10-23 (×2): qty 4

## 2017-10-23 MED ORDER — SODIUM CHLORIDE 0.9 % IV SOLN
200.0000 mg | Freq: Two times a day (BID) | INTRAVENOUS | Status: DC
  Administered 2017-10-23 – 2017-10-24 (×3): 200 mg via INTRAVENOUS
  Filled 2017-10-23 (×4): qty 20

## 2017-10-23 MED ORDER — LEVETIRACETAM 750 MG PO TABS
1500.0000 mg | ORAL_TABLET | Freq: Two times a day (BID) | ORAL | Status: DC
  Filled 2017-10-23: qty 2

## 2017-10-23 MED ORDER — POTASSIUM CHLORIDE CRYS ER 20 MEQ PO TBCR
40.0000 meq | EXTENDED_RELEASE_TABLET | Freq: Three times a day (TID) | ORAL | Status: DC
  Filled 2017-10-23: qty 2

## 2017-10-23 NOTE — Progress Notes (Signed)
PROGRESS NOTE    Kevin Humphrey  WFU:[id number] DOB: 24-Mar-1951 DOA: 10/14/2017 PCP: [name], MD  Brief Narrative: 67 year old WM PMHx Anxiety, dementia, schizophrenia, seizure disorder, aspiration pneumonia, ileus/SBO S/P lap adhesiolysis 09/22/17 (during previous admission ),Also Tx for  acute respiratory failure with hypoxia, sepsis secondary aspiration pneumonia metabolic encephalopathy,   then discharged to [facility] 6/19 -Presented from SNF due to multiple seizures that occurred on 10/14/2017. restarted on IV antiseizure medication, was refusing antiseizure medications for at least 3-4 days prior to admission. -seizures controlled on IV antiepileptics, followed by neurology, mentation improved back to baseline, also treated for suspected aspiration pneumonia -Due to dementia, schizophrenia, seizure disorder, AEDs-mentation is poor at baseline, per SNF staff-often confused, yells out and agitated at times, total care at [facility], while in stepdown, continued to refuse all meals and PO medications, Dr.Woods was recommending Hospice -transferred from [place] to my service yesterday 7/1, volume overloaded-fluids stopped, started on diuretics, mentation improving, unfortunately continues to refuse all po meds and all meals  -I called and updated with his legal guardians on file 7/2 Ms.Jewel [place] and [employer] -they agreed to discharge back to SNF/[facility] with hospice, comfort care.  Assessment & Plan:     Breakthrough seizures - Poor compliance, had been refusing antiepileptics for several days at SNF - Minimal to no by mouth intake as well -Continue current regimen of antiepileptics-Keppra and Vimpat IV, tried changing meds  -monitor and treat electrolytes   Metabolic encephalopathy - Due to seizures in the background of dementia, schizophrenia -CT head unremarkable -More awake, and alert -Refuses meals and Po medications per staff, IV fluids stopped due to volume  overload, now on diuretics -overall prognosis is poor, due to refusal of medications and diet, I called and updated his  legal guardian Jewel [place] and [employer] and explained the situation with pts refusal of meds, meals and overall poor prognosis  -they are agreeable to discharge back to [place] SNF with Hospice -will notify social work   Dysphagia -See below   Aspiration pneumonia -Chest x-ray 6/23 showed right middle lobe and lower lobe infiltrates -initially treated with levofloxacin from 6/23, now on IV Zosyn since 6/26 -stopped Zosyn -completed a seven-day course -Aspiration precautions, SLP following, dysphagia 1 diet recommended but declined all meals and fluids -Stopped IV fluids since he is a 11 L positive  Accelerated hypertension -continue metoprolol and lisinopril -appears volume overloaded 2, IV Lasix 2 doses again today    Schizophrenia ([place]) -per chart review, not on psychotropic meds at baseline -Not appropriate to start now   DVT prophylaxis: SCds due to hemoccult postive, no overt bleeding, start lovenox in 48hrs if hb stable Code Status: DNR Family Communication:no family, called and discussed with legal guardian Jewel [place] and Malachy [place] this afternoon Disposition Plan:  Back to SNF with Hospice/comfort focused care  Consultants:   neurology   Procedures:   Antimicrobials:    Subjective: -refuses all PO intake " I'm not hungry", refused all meds too " I dont want medications"  and kept repeating this  Objective: Vitals:   10/23/17 0336 10/23/17 0517 10/23/17 0756 10/23/17 1157  BP: (!) 170/80  (!) 151/72 (!) 169/78  Pulse: 76  82 74  Resp: 20  (!) 29 (!) 25  Temp: 98.6 F (37 C)  98.6 F (37 C) 97.9 F (36.6 C)  TempSrc: Axillary  Axillary Axillary  SpO2: 100%  97% 94%  Weight:  71.2 kg (156 lb 15.5  oz)    Height:        Intake/Output Summary (Last 24 hours) at 10/23/2017 1404 Last data filed at 10/23/2017 1400 Gross per 24  hour  Intake 267.83 ml  Output 4675 ml  Net -4407.17 ml   Filed Weights   10/21/17 0500 10/23/17 0000 10/23/17 0517  Weight: 73.2 kg (161 lb 6 oz) 73.5 kg (162 lb 0.6 oz) 71.2 kg (156 lb 15.5 oz)    Examination:  General exam: awake, alert, eyes open, able to answer most of my questions today, lot of repetition HEENT: PERRLA, Neck supple, + JVD Lungs: decreased BS and basilar crackles CVS: S1S2/RRR Abd: soft, Non tender, non distended, BS present Extremities: No edema Skin: no new rashes Psychiatry: pleasant, cognitive defects noted    Data Reviewed:   CBC: Recent Labs  Lab 10/19/17 0806 10/20/17 0330 10/21/17 0748 10/22/17 0651 10/23/17 0547  WBC 8.6 4.7 7.1 8.2 7.4  HGB 10.4* 9.2* 9.5* 9.7* 9.8*  HCT 32.0* 29.3* 29.9* 30.9* 31.3*  MCV 93.6 94.8 94.0 95.7 94.3  PLT 232 191 243 173 244   Basic Metabolic Panel: Recent Labs  Lab 10/19/17 0806 10/20/17 0330 10/20/17 1536 10/21/17 0748 10/22/17 0651 10/23/17 0547  NA 138 139  --  141 140 140  K 3.0* 2.9* 2.8* 4.9 4.6 3.0*  CL 104 105  --  107 104 94*  CO2 24 28  --  28 23 37*  GLUCOSE 123* 112*  --  101* 116* 83  BUN <5* <5*  --  <5* <5* <5*  CREATININE 0.60* 0.61  --  0.75 0.67 0.76  CALCIUM 8.5* 8.2*  --  8.2* 8.1* 8.5*  MG  --  1.4* 1.9 1.6* 1.9 1.5*  PHOS  --  2.5  --   --   --   --    GFR: Estimated Creatinine Clearance: 89 mL/min (by C-G formula based on SCr of 0.76 mg/dL). Liver Function Tests: Recent Labs  Lab 10/18/17 0654 10/20/17 0330  AST 24 106*  ALT 16 14  ALKPHOS 42 41  BILITOT 0.7 0.6  PROT 6.2* 5.5*  ALBUMIN 2.6* 2.4*   No results for input(s): LIPASE, AMYLASE in the last 168 hours. No results for input(s): AMMONIA in the last 168 hours. Coagulation Profile: No results for input(s): INR, PROTIME in the last 168 hours. Cardiac Enzymes: No results for input(s): CKTOTAL, CKMB, CKMBINDEX, TROPONINI in the last 168 hours. BNP (last 3 results) No results for input(s): PROBNP in  the last 8760 hours. HbA1C: No results for input(s): HGBA1C in the last 72 hours. CBG: Recent Labs  Lab 10/21/17 0100  GLUCAP 93   Lipid Profile: No results for input(s): CHOL, HDL, LDLCALC, TRIG, CHOLHDL, LDLDIRECT in the last 72 hours. Thyroid Function Tests: No results for input(s): TSH, T4TOTAL, FREET4, T3FREE, THYROIDAB in the last 72 hours. Anemia Panel: No results for input(s): VITAMINB12, FOLATE, FERRITIN, TIBC, IRON, RETICCTPCT in the last 72 hours. Urine analysis:    Component Value Date/Time   COLORURINE YELLOW 10/14/2017 [place] 10/14/2017 1650   LABSPEC 1.020 10/14/2017 1650   PHURINE 5.0 10/14/2017 1650   GLUCOSEU 50 (A) 10/14/2017 1650   HGBUR MODERATE (A) 10/14/2017 1650   BILIRUBINUR NEGATIVE 10/14/2017 1650   KETONESUR 5 (A) 10/14/2017 1650   PROTEINUR NEGATIVE 10/14/2017 1650   UROBILINOGEN 0.2 12/20/2014 1157   NITRITE NEGATIVE 10/14/2017 1650   LEUKOCYTESUR NEGATIVE 10/14/2017 1650   Sepsis Labs: @LABRCNTIP (procalcitonin:4,lacticidven:4)  ) Recent Results (from  the past 240 hour(s))  Urine culture     Status: None   Collection Time: 10/14/17  8:18 PM  Result Value Ref Range Status   Specimen Description   Final    URINE, RANDOM Performed at [place] [address], [place], [place] [address]    Special Requests   Final    NONE Performed at [facility], [place] [address], [place], [place] [address]    Culture   Final    NO GROWTH Performed at [facility], [place] [address] [place], [place] [address]    Report Status 10/16/2017 FINAL  Final  MRSA PCR Screening     Status: None   Collection Time: 10/15/17 10:47 AM  Result Value Ref Range Status   MRSA by PCR NEGATIVE NEGATIVE Final    Comment:        The GeneXpert MRSA Assay (FDA approved for NASAL specimens only), is one component of a comprehensive MRSA colonization surveillance program. It is not intended to diagnose  MRSA infection nor to guide or monitor treatment for MRSA infections. Performed at [facility], [place] [address], [place], [place] [address]          Radiology Studies: No results found.      Scheduled Meds: . chlorhexidine  15 mL Mouth Rinse BID  . feeding supplement (ENSURE ENLIVE)  237 mL Oral BID BM  . furosemide  20 mg Intravenous Q12H  . guaiFENesin  400 mg Oral TID  . lisinopril  7.5 mg Oral Daily  . mouth rinse  15 mL Mouth Rinse q12n4p  . metoprolol tartrate  5 mg Intravenous Q6H  . multivitamin with minerals  1 tablet Oral Daily  . pantoprazole  40 mg Oral Q1200  . [START ON 11/05/2017] Vitamin D (Ergocalciferol)  50,000 Units Oral Q30 days   Continuous Infusions: . dextrose 5 % and 0.9% NaCl 10 mL/hr at 10/22/17 1125  . famotidine (PEPCID) IV    . lacosamide (VIMPAT) IV 200 mg (10/23/17 1145)  . levETIRAcetam 1,500 mg (10/23/17 1147)  . potassium chloride 10 mEq (10/23/17 1250)     LOS: 7 days    Time spent: 24min    Domenic Polite, MD [facility] Page via [email or web address], password [email or web address] After 7PM please contact night-coverage  10/23/2017, 2:04 PM

## 2017-10-23 NOTE — Care Management Note (Signed)
Case Management Note  Patient Details  Name: Kevin Humphrey MRN: [id number] Date of Birth: 03-Oct-1950  Subjective/Objective:    Pt admitted with seizures. He is a long term resident at [facility].                 Action/Plan: Plan is for patient to return to [facility] when medically ready. CM following.  Expected Discharge Date:                  Expected Discharge Plan:  Skilled Nursing Facility  In-House Referral:  Clinical Social Work  Discharge planning Services     Post Acute Care Choice:    Choice offered to:     DME Arranged:    DME Agency:     HH Arranged:    [place] Agency:     Status of Service:  In process, will continue to follow  If discussed at Long Length of Stay Meetings, dates discussed:    Additional Comments:  Pollie Friar, RN 10/23/2017, 11:40 AM

## 2017-10-23 NOTE — Progress Notes (Addendum)
  Speech Language Pathology Treatment: Dysphagia  Patient Details Name: Kevin Humphrey MRN: [id number] DOB: 03-04-1951 Today's Date: 10/23/2017 Time: 1530-1540 SLP Time Calculation (min) (ACUTE ONLY): 10 min  Assessment / Plan / Recommendation Clinical Impression  Pt was seen for skilled ST targeting dysphagia goals.  Pt was agitated upon therapist's arrival, screaming "I'm not going to play anymore."  Pt would negatively respond to his name stating "I'm not Kevin Humphrey, I'm not Kevin Humphrey."  Pt presented laying flat in bed in constant writhing, choreic motion.  Pt would not accept oral care or small amounts of lemon ice. Pt spit out lemon ice boluses repeatedly.  Pt would accept iced tea via straw siphon and demonstrated no overt s/s of aspiration despite suboptimal positioning.  RN reports that pt has been refusing his medications and all medications are now being administered  Via IV. No significant PO intake reported since initiation of diet on Saturday 6/29.  Pt is dependent for feeding if he accepts POs which will make it unlikely that he will be able to meet his hydration or nutritional needs orally, even with a  PO diet.  PEG placement does not seem indicated at this time either due to pt's multiple chronic co-morbidities and lack of social support.  At this point, it seems most appropriate that any POs would be offered with the intention of improving pt's comfort and quality of life.     HPI HPI: Patient is a 67 y.o. male with PMH: advanced dementia, schizophrenia, HTN, GERD, depression, dysphagia, who was brought to hospital from SNF secondary to persistent nausea with hematemesis. Chest x-ray at SNF showed some infiltrates and abdomen x-ray concering for bowel obstruction. Patient diagnosed at hospital with sepsis likely secondary to aspiration PNA, anemia, HTN, high-grade small bowel obstruction. on 6/1, he had laproscopy diagnositic lysis of adhesions.        SLP Plan  Continue with current plan  of care       Recommendations  Diet recommendations: Dysphagia 1 (puree);Thin liquid Liquids provided via: Cup;Straw Medication Administration: Crushed with puree Supervision: Full supervision/cueing for compensatory strategies Compensations: Slow rate;Small sips/bites;Minimize environmental distractions Postural Changes and/or Swallow Maneuvers: Seated upright 90 degrees;Upright 30-60 min after meal                Oral Care Recommendations: Oral care BID Follow up Recommendations: Skilled Nursing facility SLP Visit Diagnosis: Dysphagia, oropharyngeal phase (R13.12) Plan: Continue with current plan of care       GO                PageSelinda Orion 10/23/2017, 3:43 PM

## 2017-10-24 DIAGNOSIS — F0391 Unspecified dementia with behavioral disturbance: Secondary | ICD-10-CM

## 2017-10-24 DIAGNOSIS — J181 Lobar pneumonia, unspecified organism: Secondary | ICD-10-CM

## 2017-10-24 LAB — BASIC METABOLIC PANEL
ANION GAP: 12 (ref 5–15)
BUN: <5 mg/dL — ABNORMAL LOW (ref 8–23)
CO2: 34 mmol/L — AB (ref 22–32)
Calcium: 8.7 mg/dL — ABNORMAL LOW (ref 8.9–10.3)
Chloride: 92 mmol/L — ABNORMAL LOW (ref 98–111)
Creatinine, Ser: 0.88 mg/dL (ref 0.61–1.24)
GFR calc Af Amer: >60 mL/min (ref >60)
GFR calc non Af Amer: >60 mL/min (ref >60)
GLUCOSE: 68 mg/dL — AB (ref 70–99)
POTASSIUM: 3.3 mmol/L — AB (ref 3.5–5.1)
Sodium: 138 mmol/L (ref 135–145)

## 2017-10-24 LAB — CBC
HEMATOCRIT: 34.4 % — AB (ref 39.0–52.0)
HEMOGLOBIN: 11 g/dL — AB (ref 13.0–17.0)
MCH: 29.6 pg (ref 26.0–34.0)
MCHC: 32 g/dL (ref 30.0–36.0)
MCV: 92.7 fL (ref 78.0–100.0)
Platelets: 291 10*3/uL (ref 150–400)
RBC: 3.71 MIL/uL — ABNORMAL LOW (ref 4.22–5.81)
RDW: 15.3 % (ref 11.5–15.5)
WBC: 8.6 10*3/uL (ref 4.0–10.5)

## 2017-10-24 MED ORDER — LEVETIRACETAM 100 MG/ML PO SOLN: 1500.0000 mg | Freq: Two times a day (BID) | ORAL | 0 refills | Status: AC

## 2017-10-24 MED ORDER — POTASSIUM CHLORIDE 20 MEQ/15ML (10%) PO SOLN: 40.0000 meq | Freq: Once | ORAL | Status: DC

## 2017-10-24 MED ORDER — LORAZEPAM 2 MG/ML IJ SOLN: 1.0000 mg | INTRAMUSCULAR | 0 refills | Status: AC | PRN

## 2017-10-24 MED ORDER — LACOSAMIDE 10 MG/ML PO SOLN: 200.0000 mg | Freq: Two times a day (BID) | ORAL | 0 refills | Status: AC

## 2017-10-24 MED ORDER — LORAZEPAM 2 MG/ML PO CONC: 1.0000 mg | Freq: Four times a day (QID) | ORAL | 0 refills | Status: AC | PRN

## 2017-10-24 NOTE — Progress Notes (Signed)
Pt discharged to SNF in stable condition.  Report called to [place].  Pt transported via stretcher with transporters X 2.  [id number]

## 2017-10-24 NOTE — Care Management Note (Signed)
Case Management Note  Patient Details  Name: Kevin Humphrey MRN: [id number] Date of Birth: [birth year]  Subjective/Objective:                    Action/Plan: Pt discharging to [facility] today with hospice services. CM signing off.  Expected Discharge Date:  10/24/17               Expected Discharge Plan:  Skilled Nursing Facility  In-House Referral:  Clinical Social Work  Discharge planning Services     Post Acute Care Choice:    Choice offered to:     DME Arranged:    DME Agency:     HH Arranged:    [place] Agency:     Status of Service:  Completed, signed off  If discussed at [employer], dates discussed:    Additional Comments:  Pollie Friar, RN 10/24/2017, 12:00 PM

## 2017-10-24 NOTE — Discharge Summary (Addendum)
PATIENT DETAILS Name: Brailyn Delman Age: 67 y.o. Sex: male Date of Birth: Sep 17, 1950 MRN: [id number]. Admitting Physician: Phillips Grout, MD [id number] Janifer Adie, MD  Admit Date: 10/14/2017 Discharge date: 10/24/2017  Recommendations for Outpatient Follow-up:  1. Being discharged to SNF with hospice follow-up-goals of care are for mostly comfort.  Admitted From:  SNF  Disposition: SNF   Home Health: No  Equipment/Devices: None  Discharge Condition: hospice  CODE STATUS:  DNR  Diet recommendation:  Heart Healthy Diet recommendations: Dysphagia 1 (puree);Thin liquid Liquids provided via: Cup;Straw Medication Administration: Crushed with puree Supervision: Full supervision/cueing for compensatory strategies Compensations: Slow rate;Small sips/bites;Minimize environmental distractions Postural Changes and/or Swallow Maneuvers: Seated upright 90 degrees;Upright 30-60 min after meal  Brief Summary: See H&P, Labs, Consult and Test reports for all details in brief, patient is a 67 year old male with history of schizophrenia, dementia, seizure disorder who presented from his skilled nursing facility for evaluation of numerous breakthrough seizures.  Seizures were controlled with antiepileptics, however he developed probable aspiration pneumonia.  Unfortunately-he refuses all oral intake-spits out his medications, prior hospitalist MD-Dr. Broadus John spoke with his legal guardian-very difficult situation-really no good options-legal guardian has agreed to transition to hospice/comfort care at [facility].  See below for further details   Brief Hospital Course: Breakthrough seizures: Poor compliance-unfortunately has a history of, dementia-and has been refusing medications.  Seizures were temporarily controlled with IV antiepileptics-but due to continued refusal of medications-is really no good options.  He also has had aspiration pneumonitis likely secondary to breakthrough seizures.   Prior MD-Dr. Karren Burly to with his legal guardian-very difficult situation-but legal guardian has agreed to transition to SNF but with mostly hospice/comfort care.  Is antiepileptics will be continued on discharge-if he agrees to take it-it should be given.  If he deteriorates-he probably should be transition to full comfort measures.  DNR in place.   Aspiration pneumonia: Likely secondary to above and metabolic encephalopathy.  Completed a course of Zosyn.  SLP following-continue dysphagia 1 diet.  However as noted above-patient has been refusing medications and meals as well.  See above regarding plans to discharge back to SNF with hospice care.  Per Dr. Delrae Rend hospitalist MD who spoke with patient's legal guardian-goals are for mostly comfort.  Acute metabolic encephalopathy: Multifactorial-but most likely secondary seizures-aspiration pneumonitis.  CT head without any acute abnormalities.  He is awake-and keeps on repeating what ever I say this morning.  Per nursing staff he continues to refuse meals and p.o. medications.  Very difficult situation-patient appears to have a underlying history of schizophrenia, dementia-Dr. Joseph-prior hospitalist MD spoke with patient's legal guardian over the phone-they are agreeable to discharge back to SNF with hospice care.  Dysphagia: Followed by speech therapy-recommendations are to continue with a dysphagia 1 diet-see above regarding refusal of oral intake/medications plans to transition to SNF with hospice care.  Hypertension: If he is compliant-continue with metoprolol and Lasix for now.  Schizophrenia: Per chart review-all antipsychotics were discontinued last admission due to prolonged delirium.  Advanced directive/palliative care: DNR in [place] difficult situation-he was just hospitalized a few days prior to this hospitalization for bowel obstruction-had a prolonged hospital course then with delirium.  He now presented with breakthrough  seizures.  Hospital course again complicated by delirium/encephalopathy, and aspiration pneumonitis.  Patient continues to refuse medications and oral intake.  As noted above his prognosis is very poor-he is being discharged back to SNF with hospice care.  Note-this MD left message for his legal guardian  as well-awaiting callback.  Procedures/Studies: None  Discharge Diagnoses:  Principal Problem:   Convulsions/seizures ([place]) Active Problems:   Schizophrenia ([place])   Pneumonia   Anemia, chronic disease   Seizure ([place])   Dementia   Discharge Instructions:  Activity:  As tolerated with Full fall precautions use walker/cane & assistance as needed   Discharge Instructions    Diet - low sodium heart healthy   Complete by:  As directed    Diet recommendations: Dysphagia 1 (puree);Thin liquid Liquids provided via: Cup;Straw Medication Administration: Crushed with puree Supervision: Full supervision/cueing for compensatory strategies Compensations: Slow rate;Small sips/bites;Minimize environmental distractions Postural Changes and/or Swallow Maneuvers: Seated upright 90 degrees;Upright 30-60 min after meal   Increase activity slowly   Complete by:  As directed      Allergies as of 10/24/2017   No Known Allergies     Medication List    STOP taking these medications   lacosamide 200 MG Tabs tablet Commonly known as:  VIMPAT Replaced by:  lacosamide 10 MG/ML oral solution   levETIRAcetam 500 MG tablet Commonly known as:  KEPPRA Replaced by:  levETIRAcetam 100 MG/ML solution     TAKE these medications   acetaminophen 325 MG tablet Commonly known as:  TYLENOL Take 2 tablets (650 mg total) by mouth every 6 (six) hours as needed for mild pain, moderate pain, fever or headache.   bisacodyl 10 MG suppository Commonly known as:  DULCOLAX Place 10 mg rectally as needed for moderate constipation.   feeding supplement (ENSURE ENLIVE) Liqd Take 237 mLs by mouth 2 (two) times daily  between meals.   ferrous sulfate 325 (65 FE) MG tablet Take 325 mg by mouth daily.   guaiFENesin 100 MG/5ML liquid Commonly known as:  ROBITUSSIN Take 400 mg by mouth 3 (three) times daily. 5 Day Course. Start Date 10/12/17.   ipratropium-albuterol 0.5-2.5 (3) MG/3ML Soln Commonly known as:  DUONEB Take 3 mLs by nebulization every 6 (six) hours.   lacosamide 10 MG/ML oral solution Commonly known as:  VIMPAT Take 20 mLs (200 mg total) by mouth 2 (two) times daily. Replaces:  lacosamide 200 MG Tabs tablet   levETIRAcetam 100 MG/ML solution Commonly known as:  KEPPRA Take 15 mLs (1,500 mg total) by mouth 2 (two) times daily. Replaces:  levETIRAcetam 500 MG tablet   lisinopril 5 MG tablet Commonly known as:  PRINIVIL,ZESTRIL Take 5 mg by mouth daily.   LORazepam 2 MG/ML concentrated solution Commonly known as:  ATIVAN Take 0.5 mLs (1 mg total) by mouth every 6 (six) hours as needed (seizures). What changed:    medication strength  how to take this   LORazepam 2 MG/ML injection Commonly known as:  ATIVAN Inject 0.5 mLs (1 mg total) into the muscle every 4 (four) hours as needed for seizure. What changed:  You were already taking a medication with the same name, and this prescription was added. Make sure you understand how and when to take each.   multivitamin tablet Take 1 tablet by mouth daily.   ondansetron 4 MG tablet Commonly known as:  ZOFRAN Take 4 mg by mouth every 8 (eight) hours as needed for nausea or vomiting.   polyethylene glycol packet Commonly known as:  MIRALAX / GLYCOLAX Take 17 g by mouth 2 (two) times daily. What changed:  when to take this   PROBIOTIC PO Take 1 capsule by mouth daily. 14 Day Course. Start Date 10/12/17.   ranitidine 75 MG tablet Commonly known as:  ZANTAC  Take 75 mg by mouth daily.   SYSTANE BALANCE 0.6 % Soln Generic drug:  Propylene Glycol Apply 1 drop to eye 2 (two) times daily. For dry eyes   Vitamin D3 50000 units  Caps Take 50,000 Units by mouth every 30 (thirty) days. On the 15th       Contact information for follow-up providers    [facility]. Schedule an appointment as soon as possible for a visit in 2 week(s).   Why:  Schedule Establish Care appointment in 1 to 2 weeks [facility], breakthrough seizures  Contact information: [address]     [place] [address] [phone]       Duffy Bruce, Manya Silvas, MD. Schedule an appointment as soon as possible for a visit in 1 week(s).   Specialty:  Internal Medicine Contact information: [place] [address] [phone]            Contact information for after-discharge care    Destination    [facility] .   Service:  Skilled Nursing Contact information: [address] [place] [phone]                 No Known Allergies  Consultations:  Neurology   Other Procedures/Studies: Dg Chest 2 View  Result Date: 10/14/2017 CLINICAL DATA:  Seizures. EXAM: CHEST - 2 VIEW COMPARISON:  10/08/2017 FINDINGS: The cardiac silhouette, mediastinal and hilar contours are within normal limits and stable for the AP projection. Improved aeration since the prior study with probable clearing infiltrates. Chronic underlying lung disease is again demonstrated with fairly extensive basilar pulmonary scarring. No definite acute overlying pulmonary process. Numerous healed bilateral rib fractures. No obvious acute fractures. IMPRESSION: No definite acute pulmonary findings. Significant chronic lung disease. Numerous remote healed rib fractures.  No definite acute fractures. Electronically Signed   By: Marijo Sanes M.D.   On: 10/14/2017 17:00   Dg Abd 1 View  Result Date: 10/04/2017 CLINICAL DATA:  Increased confusion, schizophrenia, feeding tube, GERD EXAM: ABDOMEN - 1 VIEW COMPARISON:  Portable exam 1252 hours compared to  09/22/2017 FINDINGS: Tip of feeding tube projects just beyond ligament of Treitz at the proximal jejunum. Air-filled mildly prominent loops of small bowel in the upper abdomen. Scattered colonic gas. Small bowel dilatation has significantly decreased since the previous exam. No definite bowel wall thickening. Bones demineralized with degenerative changes of the lumbar spine. IMPRESSION: Decreased small bowel dilatation since previous study. Electronically Signed   By: Lavonia Dana M.D.   On: 10/04/2017 13:08   Ct Head Wo Contrast  Result Date: 09/26/2017 CLINICAL DATA:  Unexplained altered level of consciousness beginning this morning. EXAM: CT HEAD WITHOUT CONTRAST TECHNIQUE: Contiguous axial images were obtained from the base of the skull through the vertex without intravenous contrast. COMPARISON:  12/12/2014 FINDINGS: Brain: Motion artifact noted. No evidence of acute infarction, hemorrhage, hydrocephalus, extra-axial collection, or mass lesion/mass effect. Mild diffuse cerebral atrophy is noted. Vascular:  No hyperdense vessel or other acute findings. Skull: No evidence of fracture or other significant bone abnormality. Sinuses/Orbits: Air-fluid level is seen within the left maxillary sinus, with diffuse thickening and sclerosis of the bony walls. This finding shows no significant change and is consistent with chronic sinusitis. Mucosal thickening is again noted within the sphenoid sinus. Other: None. IMPRESSION: No acute intracranial abnormality. Mild cerebral atrophy. Chronic sinusitis. Electronically Signed   By: Earle Gell M.D.   On: 09/26/2017 10:34  Mr Brain Wo Contrast  Result Date: 10/02/2017 CLINICAL DATA:  Initial evaluation for acute altered mental status, seizure. EXAM: MRI HEAD WITHOUT CONTRAST TECHNIQUE: Multiplanar, multiecho pulse sequences of the brain and surrounding structures were obtained without intravenous contrast. COMPARISON:  Prior MRI from 09/27/2017 FINDINGS: Brain:  Generalized age-related cerebral atrophy. Few scattered patchy T2/FLAIR hyperintensities within the periventricular and deep white matter both cerebral hemispheres, nonspecific, but felt to be within normal limits for age. No abnormal foci of restricted diffusion to suggest acute or subacute ischemia. Gray-white matter differentiation maintained. No changes related to acute seizure identified. Probable small remote left cerebellar infarct noted (series 10, image 6). No other evidence for chronic ischemia. No foci of susceptibility artifact to suggest acute or chronic intracranial hemorrhage. No mass lesion, midline shift or mass effect. No hydrocephalus. No extra-axial fluid collection. Incidental note made of a partially empty sella. Vascular: Artery, which may be occluded (series 10, image 3). Right vertebral artery diminutive but grossly patent as is the basilar artery. Anterior circulation vascular flow voids well maintained. Within the left vertebral Skull and upper cervical spine: Craniocervical junction within normal limits. Bone marrow signal intensity normal. No scalp soft tissue abnormality. Sinuses/Orbits: Globes and orbital soft tissues within normal limits. Chronic left maxillary sinusitis noted. Superimposed air-fluid levels suggest acute sinus disease. Bilateral mastoid effusions noted. Other: None. IMPRESSION: 1. No acute intracranial abnormality. 2. Small remote left cerebellar infarct. 3. 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 Overall vertebrobasilar system is diminutive, with suspected predominant fetal type origin of the PCAs. 4. Acute on chronic left maxillary sinusitis. Electronically Signed   By: Jeannine Boga M.D.   On: 10/02/2017 03:56   Mr Brain Wo Contrast  Result Date: 09/27/2017 CLINICAL DATA:  Altered level of consciousness. History of schizophrenia, hyperlipidemia. EXAM: MRI HEAD WITHOUT CONTRAST TECHNIQUE: Multiplanar, multiecho pulse sequences  of the brain and surrounding structures were obtained without intravenous contrast. COMPARISON:  CT head September 26, 2017 FINDINGS: Nonstandard coil used as patient's head was not accommodated by standard coil. Overall poor signal to noise ratio. Moderately motion degraded examination. INTRACRANIAL CONTENTS: No reduced diffusion to suggest acute ischemia. No susceptibility artifact to suggest hemorrhage. The ventricles and sulci are normal for patient's age. Mild probable chronic small vessel ischemic changes. No suspicious parenchymal signal, or mass effect. No abnormal extra-axial fluid collections. No extra-axial masses. VASCULAR: Normal major intracranial vascular flow voids present at skull base. SKULL AND UPPER CERVICAL SPINE: No abnormal sellar expansion. No suspicious calvarial bone marrow signal. Craniocervical junction maintained. SINUSES/ORBITS: Small left mastoid effusion. Left maxillary sinusitis, partially characterized.The included ocular globes and orbital contents are non-suspicious. OTHER: None. IMPRESSION: 1. Technically limited examination without acute intracranial process. Electronically Signed   By: Elon Alas M.D.   On: 09/27/2017 19:06   Dg Chest Port 1 View  Result Date: 10/08/2017 CLINICAL DATA:  Sudden onset of shortness of breath and respiratory distress today. Clinical concerns discern over possible volume overload. EXAM: PORTABLE CHEST 1 VIEW COMPARISON:  Portable chest x-ray of Sep 03, 2017 FINDINGS: The lungs are adequately inflated. The interstitial markings are increased. The pulmonary vascularity is engorged and indistinct. The cardiac silhouette is enlarged. There is no pleural effusion. There is calcification in the wall of the thoracic aortic arch. Old right lateral rib deformities are present and stable. IMPRESSION: Findings worrisome for pulmonary interstitial and early alveolar edema. The appearance of the chest has deteriorated slightly since the previous study.  Thoracic aortic atherosclerosis.  Electronically Signed   By: David  Martinique M.D.   On: 10/08/2017 15:22   Dg Chest Port 1 View  Result Date: 10/04/2017 CLINICAL DATA:  Increased confusion, feeding tube, GERD, schizophrenia EXAM: PORTABLE CHEST 1 VIEW COMPARISON:  Portable exam 1252 hours compared to 10/02/2017 FINDINGS: Feeding tube extends into stomach. Normal heart size, mediastinal contours, and pulmonary vascularity. BILATERAL pulmonary infiltrates in the mid to lower lungs unchanged. Apices clear. No pleural effusion or pneumothorax. Bones demineralized. Old RIGHT rib fractures. IMPRESSION: Persistent BILATERAL pulmonary infiltrates in the mid to lower lungs. Electronically Signed   By: Lavonia Dana M.D.   On: 10/04/2017 13:06   Dg Chest Port 1 View  Result Date: 10/02/2017 CLINICAL DATA:  Aspiration into airway EXAM: PORTABLE CHEST 1 VIEW COMPARISON:  Portable exam 1735 hours compared to 09/25/2017 FINDINGS: Feeding tube traverses esophagus into stomach. Rotated exam. Normal heart size mediastinal contours. BILATERAL pulmonary infiltrates RIGHT greater than LEFT. No pleural effusion or pneumothorax. Bones demineralized. IMPRESSION: Persistent BILATERAL pulmonary infiltrates. Electronically Signed   By: Lavonia Dana M.D.   On: 10/02/2017 18:01   Dg Chest Port 1 View  Result Date: 09/25/2017 CLINICAL DATA:  67 year old male with shortness breath. Subsequent encounter. EXAM: PORTABLE CHEST 1 VIEW COMPARISON:  09/24/2017 and 12/11/2016 chest x-ray. FINDINGS: Persistent slightly asymmetric airspace disease may represent pulmonary edema superimposed upon chronic lung changes. Infectious infiltrate secondary less likely consideration. No pneumothorax noted. Heart size within normal limits. Calcified tortuous aorta. Nasogastric tube tip gastric body/antrum junction. IMPRESSION: Similar appearance of slightly asymmetric airspace disease which may represent pulmonary edema superimposed upon chronic lung changes.  Infectious infiltrate less likely consideration. Aortic Atherosclerosis (ICD10-I70.0). Electronically Signed   By: Genia Del M.D.   On: 09/25/2017 06:47   Dg Swallowing Func-speech Pathology  Result Date: 10/06/2017 Objective Swallowing Evaluation: Type of Study: MBS-Modified Barium Swallow Study  Patient Details Name: Jaber Dunlow MRN: [id number] Date of Birth: [birth year] Today's Date: 10/06/2017 Time: SLP Start Time (ACUTE ONLY): 1115 -SLP Stop Time (ACUTE ONLY): 1135 SLP Time Calculation (min) (ACUTE ONLY): 20 min Past Medical History: Past Medical History: Diagnosis Date . Allergic rhinitis 08/02/2012 . Depression 08/02/2012 . Dysphagia, unspecified 08/02/2012 . GERD (gastroesophageal reflux disease) 08/02/2012 . Other and unspecified hyperlipidemia 08/02/2012 . Schizophrenia ([place]) 08/02/2012 . Vertebral compression fracture ([place]) 08/02/2012 Past Surgical History: Past Surgical History: Procedure Laterality Date . EXPLORATORY LAPAROTOMY    ??? (Has large midline incision) . LAPAROSCOPY N/A 09/22/2017  Procedure: LAPAROSCOPY DIAGNOSTIC  LYSIS OF ADHESIONS;  Surgeon: Michael Boston, MD;  Location: [facility];  Service: General;  Laterality: N/A; HPI: Patient is a 67 y.o. male with PMH: advanced dementia, schizophrenia, HTN, GERD, depression, dysphagia, who was brought to hospital from SNF secondary to persistent nausea with hematemesis. Chest x-ray at SNF showed some infiltrates and abdomen x-ray concering for bowel obstruction. Patient diagnosed at hospital with sepsis likely secondary to aspiration PNA, anemia, HTN, high-grade small bowel obstruction. on 6/1, he had laproscopy diagnositic lysis of adhesions.  Subjective: "I'm hungry. I wanna eat." Assessment / Plan / Recommendation CHL IP CLINICAL IMPRESSIONS 10/06/2017 Clinical Impression Patient presents with what appears to be adequate strength and timing during this evaluation, which was limited due to pt agitation. Pt took several boluses of puree and a single  teaspoon of thin liquid; no penetration or aspiration observed. There was no oral holding, timely anterior to posterior transit. Swallow initiation was timely with complete epiglottic deflection and airway closure. No residue remained in the pharynx after  the swallow. An esophageal sweep was performed and was unremarkable. Consistent with bedside assessment, pt was tachypneic, though this appeared to be due to anxiety and was not associated with airway compromise. Pt does remain at risk for aspiration primarily due to cognitive deficits and mentation. Recommend dys 1, thin liquids with full supervision; ensure pt is fully alert and assist with self-feeding as able. Will follow for tolerance, possible progression of solids. SLP Visit Diagnosis Dysphagia, oropharyngeal phase (R13.12) Attention and concentration deficit following -- Frontal lobe and executive function deficit following -- Impact on safety and function Mild aspiration risk;Moderate aspiration risk   CHL IP TREATMENT RECOMMENDATION 10/06/2017 Treatment Recommendations Therapy as outlined in treatment plan below   Prognosis 10/06/2017 Prognosis for Safe Diet Advancement Fair Barriers to Reach Goals Cognitive deficits;Behavior Barriers/Prognosis Comment -- CHL IP DIET RECOMMENDATION 10/06/2017 SLP Diet Recommendations Dysphagia 1 (Puree) solids;Thin liquid Liquid Administration via Spoon;Cup Medication Administration Crushed with puree Compensations Slow rate;Small sips/bites;Minimize environmental distractions Postural Changes Remain semi-upright after after feeds/meals (Comment);Seated upright at 90 degrees   CHL IP OTHER RECOMMENDATIONS 10/06/2017 Recommended Consults -- Oral Care Recommendations Oral care BID Other Recommendations --   CHL IP FOLLOW UP RECOMMENDATIONS 10/06/2017 Follow up Recommendations Other (comment)   CHL IP FREQUENCY AND DURATION 10/06/2017 Speech Therapy Frequency (ACUTE ONLY) min 2x/week Treatment Duration 2 weeks      CHL IP ORAL  PHASE 10/06/2017 Oral Phase WFL Oral - Pudding Teaspoon -- Oral - Pudding Cup -- Oral - Honey Teaspoon -- Oral - Honey Cup -- Oral - Nectar Teaspoon -- Oral - Nectar Cup -- Oral - Nectar Straw -- Oral - Thin Teaspoon -- Oral - Thin Cup -- Oral - Thin Straw -- Oral - Puree -- Oral - Mech Soft -- Oral - Regular -- Oral - Multi-Consistency -- Oral - Pill -- Oral Phase - Comment --  CHL IP PHARYNGEAL PHASE 10/06/2017 Pharyngeal Phase WFL Pharyngeal- Pudding Teaspoon -- Pharyngeal -- Pharyngeal- Pudding Cup -- Pharyngeal -- Pharyngeal- Honey Teaspoon -- Pharyngeal -- Pharyngeal- Honey Cup -- Pharyngeal -- Pharyngeal- Nectar Teaspoon -- Pharyngeal -- Pharyngeal- Nectar Cup -- Pharyngeal -- Pharyngeal- Nectar Straw -- Pharyngeal -- Pharyngeal- Thin Teaspoon -- Pharyngeal -- Pharyngeal- Thin Cup -- Pharyngeal -- Pharyngeal- Thin Straw -- Pharyngeal -- Pharyngeal- Puree -- Pharyngeal -- Pharyngeal- Mechanical Soft -- Pharyngeal -- Pharyngeal- Regular -- Pharyngeal -- Pharyngeal- Multi-consistency -- Pharyngeal -- Pharyngeal- Pill -- Pharyngeal -- Pharyngeal Comment --  CHL IP CERVICAL ESOPHAGEAL PHASE 10/06/2017 Cervical Esophageal Phase WFL Pudding Teaspoon -- Pudding Cup -- Honey Teaspoon -- Honey Cup -- Nectar Teaspoon -- Nectar Cup -- Nectar Straw -- Thin Teaspoon -- Thin Cup -- Thin Straw -- Puree -- Mechanical Soft -- Regular -- Multi-consistency -- Pill -- Cervical Esophageal Comment -- Deneise Lever, MS, CCC-SLP Speech-Language Pathologist [phone] No flowsheet data found. Aliene Altes 10/06/2017, 1:02 PM                TODAY-DAY OF DISCHARGE:  Subjective:   [name] today is awake but remains pleasantly confused.  Keeps on repeating the same words that I said.  Per nursing staff-continues to refuse all oral medications and oral intake.  Objective:   Blood pressure (!) 174/112, pulse 81, temperature 98.4 F (36.9 C), temperature source Axillary, resp. rate (!) 27, height 5\' 11"  (1.803 m), weight  68.9 kg (151 lb 14.4 oz), SpO2 98 %.  Intake/Output Summary (Last 24 hours) at 10/24/2017 1422 Last data filed at 10/24/2017 0530 Gross per  24 hour  Intake 1070 ml  Output 800 ml  Net 270 ml   Filed Weights   10/23/17 0000 10/23/17 0517 10/24/17 0406  Weight: 73.5 kg (162 lb 0.6 oz) 71.2 kg (156 lb 15.5 oz) 68.9 kg (151 lb 14.4 oz)    Exam: Awake No new F.N deficits [place].AT,PERRAL Supple Neck,No JVD, No cervical lymphadenopathy appriciated.  Symmetrical Chest wall movement, Good air movement bilaterally, CTAB RRR,No Gallops,Rubs or new Murmurs, No Parasternal Heave +ve B.Sounds, Abd Soft, Non tender, No organomegaly appriciated, No rebound -guarding or rigidity. No Cyanosis, Clubbing or edema, No new Rash or bruise   PERTINENT RADIOLOGIC STUDIES: Dg Chest 2 View  Result Date: 10/14/2017 CLINICAL DATA:  Seizures. EXAM: CHEST - 2 VIEW COMPARISON:  10/08/2017 FINDINGS: The cardiac silhouette, mediastinal and hilar contours are within normal limits and stable for the AP projection. Improved aeration since the prior study with probable clearing infiltrates. Chronic underlying lung disease is again demonstrated with fairly extensive basilar pulmonary scarring. No definite acute overlying pulmonary process. Numerous healed bilateral rib fractures. No obvious acute fractures. IMPRESSION: No definite acute pulmonary findings. Significant chronic lung disease. Numerous remote healed rib fractures.  No definite acute fractures. Electronically Signed   By: Marijo Sanes M.D.   On: 10/14/2017 17:00   Dg Abd 1 View  Result Date: 10/04/2017 CLINICAL DATA:  Increased confusion, schizophrenia, feeding tube, GERD EXAM: ABDOMEN - 1 VIEW COMPARISON:  Portable exam 1252 hours compared to 09/22/2017 FINDINGS: Tip of feeding tube projects just beyond ligament of Treitz at the proximal jejunum. Air-filled mildly prominent loops of small bowel in the upper abdomen. Scattered colonic gas. Small bowel dilatation has  significantly decreased since the previous exam. No definite bowel wall thickening. Bones demineralized with degenerative changes of the lumbar spine. IMPRESSION: Decreased small bowel dilatation since previous study. Electronically Signed   By: Lavonia Dana M.D.   On: 10/04/2017 13:08   Ct Head Wo Contrast  Result Date: 09/26/2017 CLINICAL DATA:  Unexplained altered level of consciousness beginning this morning. EXAM: CT HEAD WITHOUT CONTRAST TECHNIQUE: Contiguous axial images were obtained from the base of the skull through the vertex without intravenous contrast. COMPARISON:  12/12/2014 FINDINGS: Brain: Motion artifact noted. No evidence of acute infarction, hemorrhage, hydrocephalus, extra-axial collection, or mass lesion/mass effect. Mild diffuse cerebral atrophy is noted. Vascular:  No hyperdense vessel or other acute findings. Skull: No evidence of fracture or other significant bone abnormality. Sinuses/Orbits: Air-fluid level is seen within the left maxillary sinus, with diffuse thickening and sclerosis of the bony walls. This finding shows no significant change and is consistent with chronic sinusitis. Mucosal thickening is again noted within the sphenoid sinus. Other: None. IMPRESSION: No acute intracranial abnormality. Mild cerebral atrophy. Chronic sinusitis. Electronically Signed   By: Earle Gell M.D.   On: 09/26/2017 10:34   Mr Brain Wo Contrast  Result Date: 10/02/2017 CLINICAL DATA:  Initial evaluation for acute altered mental status, seizure. EXAM: MRI HEAD WITHOUT CONTRAST TECHNIQUE: Multiplanar, multiecho pulse sequences of the brain and surrounding structures were obtained without intravenous contrast. COMPARISON:  Prior MRI from 09/27/2017 FINDINGS: Brain: Generalized age-related cerebral atrophy. Few scattered patchy T2/FLAIR hyperintensities within the periventricular and deep white matter both cerebral hemispheres, nonspecific, but felt to be within normal limits for age. No abnormal  foci of restricted diffusion to suggest acute or subacute ischemia. Gray-white matter differentiation maintained. No changes related to acute seizure identified. Probable small remote left cerebellar infarct noted (series 10, image 6). No  other evidence for chronic ischemia. No foci of susceptibility artifact to suggest acute or chronic intracranial hemorrhage. No mass lesion, midline shift or mass effect. No hydrocephalus. No extra-axial fluid collection. Incidental note made of a partially empty sella. Vascular: Artery, which may be occluded (series 10, image 3). Right vertebral artery diminutive but grossly patent as is the basilar artery. Anterior circulation vascular flow voids well maintained. Within the left vertebral Skull and upper cervical spine: Craniocervical junction within normal limits. Bone marrow signal intensity normal. No scalp soft tissue abnormality. Sinuses/Orbits: Globes and orbital soft tissues within normal limits. Chronic left maxillary sinusitis noted. Superimposed air-fluid levels suggest acute sinus disease. Bilateral mastoid effusions noted. Other: None. IMPRESSION: 1. No acute intracranial abnormality. 2. Small remote left cerebellar infarct. 3. 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 Overall vertebrobasilar system is diminutive, with suspected predominant fetal type origin of the PCAs. 4. Acute on chronic left maxillary sinusitis. Electronically Signed   By: Jeannine Boga M.D.   On: 10/02/2017 03:56   Mr Brain Wo Contrast  Result Date: 09/27/2017 CLINICAL DATA:  Altered level of consciousness. History of schizophrenia, hyperlipidemia. EXAM: MRI HEAD WITHOUT CONTRAST TECHNIQUE: Multiplanar, multiecho pulse sequences of the brain and surrounding structures were obtained without intravenous contrast. COMPARISON:  CT head September 26, 2017 FINDINGS: Nonstandard coil used as patient's head was not accommodated by standard coil. Overall poor signal to noise  ratio. Moderately motion degraded examination. INTRACRANIAL CONTENTS: No reduced diffusion to suggest acute ischemia. No susceptibility artifact to suggest hemorrhage. The ventricles and sulci are normal for patient's age. Mild probable chronic small vessel ischemic changes. No suspicious parenchymal signal, or mass effect. No abnormal extra-axial fluid collections. No extra-axial masses. VASCULAR: Normal major intracranial vascular flow voids present at skull base. SKULL AND UPPER CERVICAL SPINE: No abnormal sellar expansion. No suspicious calvarial bone marrow signal. Craniocervical junction maintained. SINUSES/ORBITS: Small left mastoid effusion. Left maxillary sinusitis, partially characterized.The included ocular globes and orbital contents are non-suspicious. OTHER: None. IMPRESSION: 1. Technically limited examination without acute intracranial process. Electronically Signed   By: Elon Alas M.D.   On: 09/27/2017 19:06   Dg Chest Port 1 View  Result Date: 10/08/2017 CLINICAL DATA:  Sudden onset of shortness of breath and respiratory distress today. Clinical concerns discern over possible volume overload. EXAM: PORTABLE CHEST 1 VIEW COMPARISON:  Portable chest x-ray of Sep 03, 2017 FINDINGS: The lungs are adequately inflated. The interstitial markings are increased. The pulmonary vascularity is engorged and indistinct. The cardiac silhouette is enlarged. There is no pleural effusion. There is calcification in the wall of the thoracic aortic arch. Old right lateral rib deformities are present and stable. IMPRESSION: Findings worrisome for pulmonary interstitial and early alveolar edema. The appearance of the chest has deteriorated slightly since the previous study. Thoracic aortic atherosclerosis. Electronically Signed   By: David  Martinique M.D.   On: 10/08/2017 15:22   Dg Chest Port 1 View  Result Date: 10/04/2017 CLINICAL DATA:  Increased confusion, feeding tube, GERD, schizophrenia EXAM: PORTABLE  CHEST 1 VIEW COMPARISON:  Portable exam 1252 hours compared to 10/02/2017 FINDINGS: Feeding tube extends into stomach. Normal heart size, mediastinal contours, and pulmonary vascularity. BILATERAL pulmonary infiltrates in the mid to lower lungs unchanged. Apices clear. No pleural effusion or pneumothorax. Bones demineralized. Old RIGHT rib fractures. IMPRESSION: Persistent BILATERAL pulmonary infiltrates in the mid to lower lungs. Electronically Signed   By: Lavonia Dana M.D.   On: 10/04/2017 13:06   Dg  Chest Port 1 View  Result Date: 10/02/2017 CLINICAL DATA:  Aspiration into airway EXAM: PORTABLE CHEST 1 VIEW COMPARISON:  Portable exam 1735 hours compared to 09/25/2017 FINDINGS: Feeding tube traverses esophagus into stomach. Rotated exam. Normal heart size mediastinal contours. BILATERAL pulmonary infiltrates RIGHT greater than LEFT. No pleural effusion or pneumothorax. Bones demineralized. IMPRESSION: Persistent BILATERAL pulmonary infiltrates. Electronically Signed   By: Lavonia Dana M.D.   On: 10/02/2017 18:01   Dg Chest Port 1 View  Result Date: 09/25/2017 CLINICAL DATA:  67 year old male with shortness breath. Subsequent encounter. EXAM: PORTABLE CHEST 1 VIEW COMPARISON:  09/24/2017 and 12/11/2016 chest x-ray. FINDINGS: Persistent slightly asymmetric airspace disease may represent pulmonary edema superimposed upon chronic lung changes. Infectious infiltrate secondary less likely consideration. No pneumothorax noted. Heart size within normal limits. Calcified tortuous aorta. Nasogastric tube tip gastric body/antrum junction. IMPRESSION: Similar appearance of slightly asymmetric airspace disease which may represent pulmonary edema superimposed upon chronic lung changes. Infectious infiltrate less likely consideration. Aortic Atherosclerosis (ICD10-I70.0). Electronically Signed   By: Genia Del M.D.   On: 09/25/2017 06:47   Dg Swallowing Func-speech Pathology  Result Date: 10/06/2017 Objective  Swallowing Evaluation: Type of Study: MBS-Modified Barium Swallow Study  Patient Details Name: Germany Chelf MRN: [id number] Date of Birth: 18-Feb-1951 Today's Date: 10/06/2017 Time: SLP Start Time (ACUTE ONLY): 1115 -SLP Stop Time (ACUTE ONLY): 1135 SLP Time Calculation (min) (ACUTE ONLY): 20 min Past Medical History: Past Medical History: Diagnosis Date . Allergic rhinitis 08/02/2012 . Depression 08/02/2012 . Dysphagia, unspecified 08/02/2012 . GERD (gastroesophageal reflux disease) 08/02/2012 . Other and unspecified hyperlipidemia 08/02/2012 . Schizophrenia ([place]) 08/02/2012 . Vertebral compression fracture ([place]) 08/02/2012 Past Surgical History: Past Surgical History: Procedure Laterality Date . EXPLORATORY LAPAROTOMY    ??? (Has large midline incision) . LAPAROSCOPY N/A 09/22/2017  Procedure: LAPAROSCOPY DIAGNOSTIC  LYSIS OF ADHESIONS;  Surgeon: Michael Boston, MD;  Location: [facility];  Service: General;  Laterality: N/A; HPI: Patient is a 67 y.o. male with PMH: advanced dementia, schizophrenia, HTN, GERD, depression, dysphagia, who was brought to hospital from SNF secondary to persistent nausea with hematemesis. Chest x-ray at SNF showed some infiltrates and abdomen x-ray concering for bowel obstruction. Patient diagnosed at hospital with sepsis likely secondary to aspiration PNA, anemia, HTN, high-grade small bowel obstruction. on 6/1, he had laproscopy diagnositic lysis of adhesions.  Subjective: "I'm hungry. I wanna eat." Assessment / Plan / Recommendation CHL IP CLINICAL IMPRESSIONS 10/06/2017 Clinical Impression Patient presents with what appears to be adequate strength and timing during this evaluation, which was limited due to pt agitation. Pt took several boluses of puree and a single teaspoon of thin liquid; no penetration or aspiration observed. There was no oral holding, timely anterior to posterior transit. Swallow initiation was timely with complete epiglottic deflection and airway closure. No residue remained in  the pharynx after the swallow. An esophageal sweep was performed and was unremarkable. Consistent with bedside assessment, pt was tachypneic, though this appeared to be due to anxiety and was not associated with airway compromise. Pt does remain at risk for aspiration primarily due to cognitive deficits and mentation. Recommend dys 1, thin liquids with full supervision; ensure pt is fully alert and assist with self-feeding as able. Will follow for tolerance, possible progression of solids. SLP Visit Diagnosis Dysphagia, oropharyngeal phase (R13.12) Attention and concentration deficit following -- Frontal lobe and executive function deficit following -- Impact on safety and function Mild aspiration risk;Moderate aspiration risk   CHL IP TREATMENT RECOMMENDATION 10/06/2017 Treatment Recommendations  Therapy as outlined in treatment plan below   Prognosis 10/06/2017 Prognosis for Safe Diet Advancement Fair Barriers to Reach Goals Cognitive deficits;Behavior Barriers/Prognosis Comment -- CHL IP DIET RECOMMENDATION 10/06/2017 SLP Diet Recommendations Dysphagia 1 (Puree) solids;Thin liquid Liquid Administration via Spoon;Cup Medication Administration Crushed with puree Compensations Slow rate;Small sips/bites;Minimize environmental distractions Postural Changes Remain semi-upright after after feeds/meals (Comment);Seated upright at 90 degrees   CHL IP OTHER RECOMMENDATIONS 10/06/2017 Recommended Consults -- Oral Care Recommendations Oral care BID Other Recommendations --   CHL IP FOLLOW UP RECOMMENDATIONS 10/06/2017 Follow up Recommendations Other (comment)   CHL IP FREQUENCY AND DURATION 10/06/2017 Speech Therapy Frequency (ACUTE ONLY) min 2x/week Treatment Duration 2 weeks      CHL IP ORAL PHASE 10/06/2017 Oral Phase WFL Oral - Pudding Teaspoon -- Oral - Pudding Cup -- Oral - Honey Teaspoon -- Oral - Honey Cup -- Oral - Nectar Teaspoon -- Oral - Nectar Cup -- Oral - Nectar Straw -- Oral - Thin Teaspoon -- Oral - Thin Cup --  Oral - Thin Straw -- Oral - Puree -- Oral - Mech Soft -- Oral - Regular -- Oral - Multi-Consistency -- Oral - Pill -- Oral Phase - Comment --  CHL IP PHARYNGEAL PHASE 10/06/2017 Pharyngeal Phase WFL Pharyngeal- Pudding Teaspoon -- Pharyngeal -- Pharyngeal- Pudding Cup -- Pharyngeal -- Pharyngeal- Honey Teaspoon -- Pharyngeal -- Pharyngeal- Honey Cup -- Pharyngeal -- Pharyngeal- Nectar Teaspoon -- Pharyngeal -- Pharyngeal- Nectar Cup -- Pharyngeal -- Pharyngeal- Nectar Straw -- Pharyngeal -- Pharyngeal- Thin Teaspoon -- Pharyngeal -- Pharyngeal- Thin Cup -- Pharyngeal -- Pharyngeal- Thin Straw -- Pharyngeal -- Pharyngeal- Puree -- Pharyngeal -- Pharyngeal- Mechanical Soft -- Pharyngeal -- Pharyngeal- Regular -- Pharyngeal -- Pharyngeal- Multi-consistency -- Pharyngeal -- Pharyngeal- Pill -- Pharyngeal -- Pharyngeal Comment --  CHL IP CERVICAL ESOPHAGEAL PHASE 10/06/2017 Cervical Esophageal Phase WFL Pudding Teaspoon -- Pudding Cup -- Honey Teaspoon -- Honey Cup -- Nectar Teaspoon -- Nectar Cup -- Nectar Straw -- Thin Teaspoon -- Thin Cup -- Thin Straw -- Puree -- Mechanical Soft -- Regular -- Multi-consistency -- Pill -- Cervical Esophageal Comment -- Deneise Lever, MS, CCC-SLP Speech-Language Pathologist [phone] No flowsheet data found. Aliene Altes 10/06/2017, 1:02 PM                PERTINENT LAB RESULTS: CBC: Recent Labs    10/23/17 0547 10/24/17 0403  WBC 7.4 8.6  HGB 9.8* 11.0*  HCT 31.3* 34.4*  PLT 249 291   CMET CMP     Component Value Date/Time   NA 138 10/24/2017 0403   NA 137 08/27/2015   K 3.3 (L) 10/24/2017 0403   CL 92 (L) 10/24/2017 0403   CO2 34 (H) 10/24/2017 0403   GLUCOSE 68 (L) 10/24/2017 0403   BUN <5 (L) 10/24/2017 0403   BUN 16 08/27/2015   CREATININE 0.88 10/24/2017 0403   CALCIUM 8.7 (L) 10/24/2017 0403   PROT 5.5 (L) 10/20/2017 0330   ALBUMIN 2.4 (L) 10/20/2017 0330   AST 106 (H) 10/20/2017 0330   ALT 14 10/20/2017 0330   ALKPHOS 41 10/20/2017 0330    BILITOT 0.6 10/20/2017 0330   GFRNONAA >60 10/24/2017 0403   GFRAA >60 10/24/2017 0403    GFR Estimated Creatinine Clearance: 78.3 mL/min (by C-G formula based on SCr of 0.88 mg/dL). No results for input(s): LIPASE, AMYLASE in the last 72 hours. No results for input(s): CKTOTAL, CKMB, CKMBINDEX, TROPONINI in the last 72 hours. Invalid input(s): POCBNP No results for input(s): DDIMER  in the last 72 hours. No results for input(s): HGBA1C in the last 72 hours. No results for input(s): CHOL, HDL, LDLCALC, TRIG, CHOLHDL, LDLDIRECT in the last 72 hours. No results for input(s): TSH, T4TOTAL, T3FREE, THYROIDAB in the last 72 hours.  Invalid input(s): FREET3 No results for input(s): VITAMINB12, FOLATE, FERRITIN, TIBC, IRON, RETICCTPCT in the last 72 hours. Coags: No results for input(s): INR in the last 72 hours.  Invalid input(s): PT Microbiology: Recent Results (from the past 240 hour(s))  Urine culture     Status: None   Collection Time: 10/14/17  8:18 PM  Result Value Ref Range Status   Specimen Description   Final    URINE, RANDOM Performed at [facility] [address], [place], [place] [address]    Special Requests   Final    NONE Performed at [facility], [place] [address] [place], [place] [address]    Culture   Final    NO GROWTH Performed at [facility], [place] [address], [place], [place] [address]    Report Status 10/16/2017 FINAL  Final  MRSA PCR Screening     Status: None   Collection Time: 10/15/17 10:47 AM  Result Value Ref Range Status   MRSA by PCR NEGATIVE NEGATIVE Final    Comment:        The GeneXpert MRSA Assay (FDA approved for NASAL specimens only), is one component of a comprehensive MRSA colonization surveillance program. It is not intended to diagnose MRSA infection nor to guide or monitor treatment for MRSA infections. Performed at [facility], [place] [address] [place], [place]  [address]     FURTHER DISCHARGE INSTRUCTIONS:  Get Medicines reviewed and adjusted: Please take all your medications with you for your next visit with your Primary MD  Laboratory/radiological data: Please request your Primary MD to go over all hospital tests and procedure/radiological results at the follow up, please ask your Primary MD to get all Hospital records sent to his/her office.  In some cases, they will be blood work, cultures and biopsy results pending at the time of your discharge. Please request that your primary care M.D. goes through all the records of your hospital data and follows up on these results.  Also Note the following: If you experience worsening of your admission symptoms, develop shortness of breath, life threatening emergency, suicidal or homicidal thoughts you must seek medical attention immediately by calling 911 or calling your MD immediately  if symptoms less severe.  You must read complete instructions/literature along with all the possible adverse reactions/side effects for all the Medicines you take and that have been prescribed to you. Take any new Medicines after you have completely understood and accpet all the possible adverse reactions/side effects.   Do not drive when taking Pain medications or sleeping medications (Benzodaizepines)  Do not take more than prescribed Pain, Sleep and Anxiety Medications. It is not advisable to combine anxiety,sleep and pain medications without talking with your primary care practitioner  Special Instructions: If you have smoked or chewed Tobacco  in the last 2 yrs please stop smoking, stop any regular Alcohol  and or any Recreational drug use.  Wear Seat belts while driving.  Please note: You were cared for by a hospitalist during your hospital stay. Once you are discharged, your primary care physician will handle any further medical issues. Please note that NO REFILLS for any discharge medications will be authorized  once you are discharged,  as it is imperative that you return to your primary care physician (or establish a relationship with a primary care physician if you do not have one) for your post hospital discharge needs so that they can reassess your need for medications and monitor your lab values.  Total Time spent coordinating discharge including counseling, education and face to face time equals  45 minutes.  Signed: Shanker Ghimire 10/24/2017 2:22 PM

## 2017-10-24 NOTE — Progress Notes (Signed)
Discharge to: [facility] Anticipated discharge date: 10/24/17 Family notified: Yes, by phone Transportation by: [facility]  Report #: [phone], [phone]  [place] signing off.  Laveda Abbe LCSW [phone]

## 2017-11-22 DEATH — deceased

## 2019-05-03 IMAGING — DX DG ABDOMEN 1V
2 series · 2 of 2 positions shown · non-contrast
Comparison: Yesterday

CLINICAL DATA: Follow-up small bowel obstruction

EXAM:
ABDOMEN - 1 VIEW

[abdomen kub (1 of 2)]
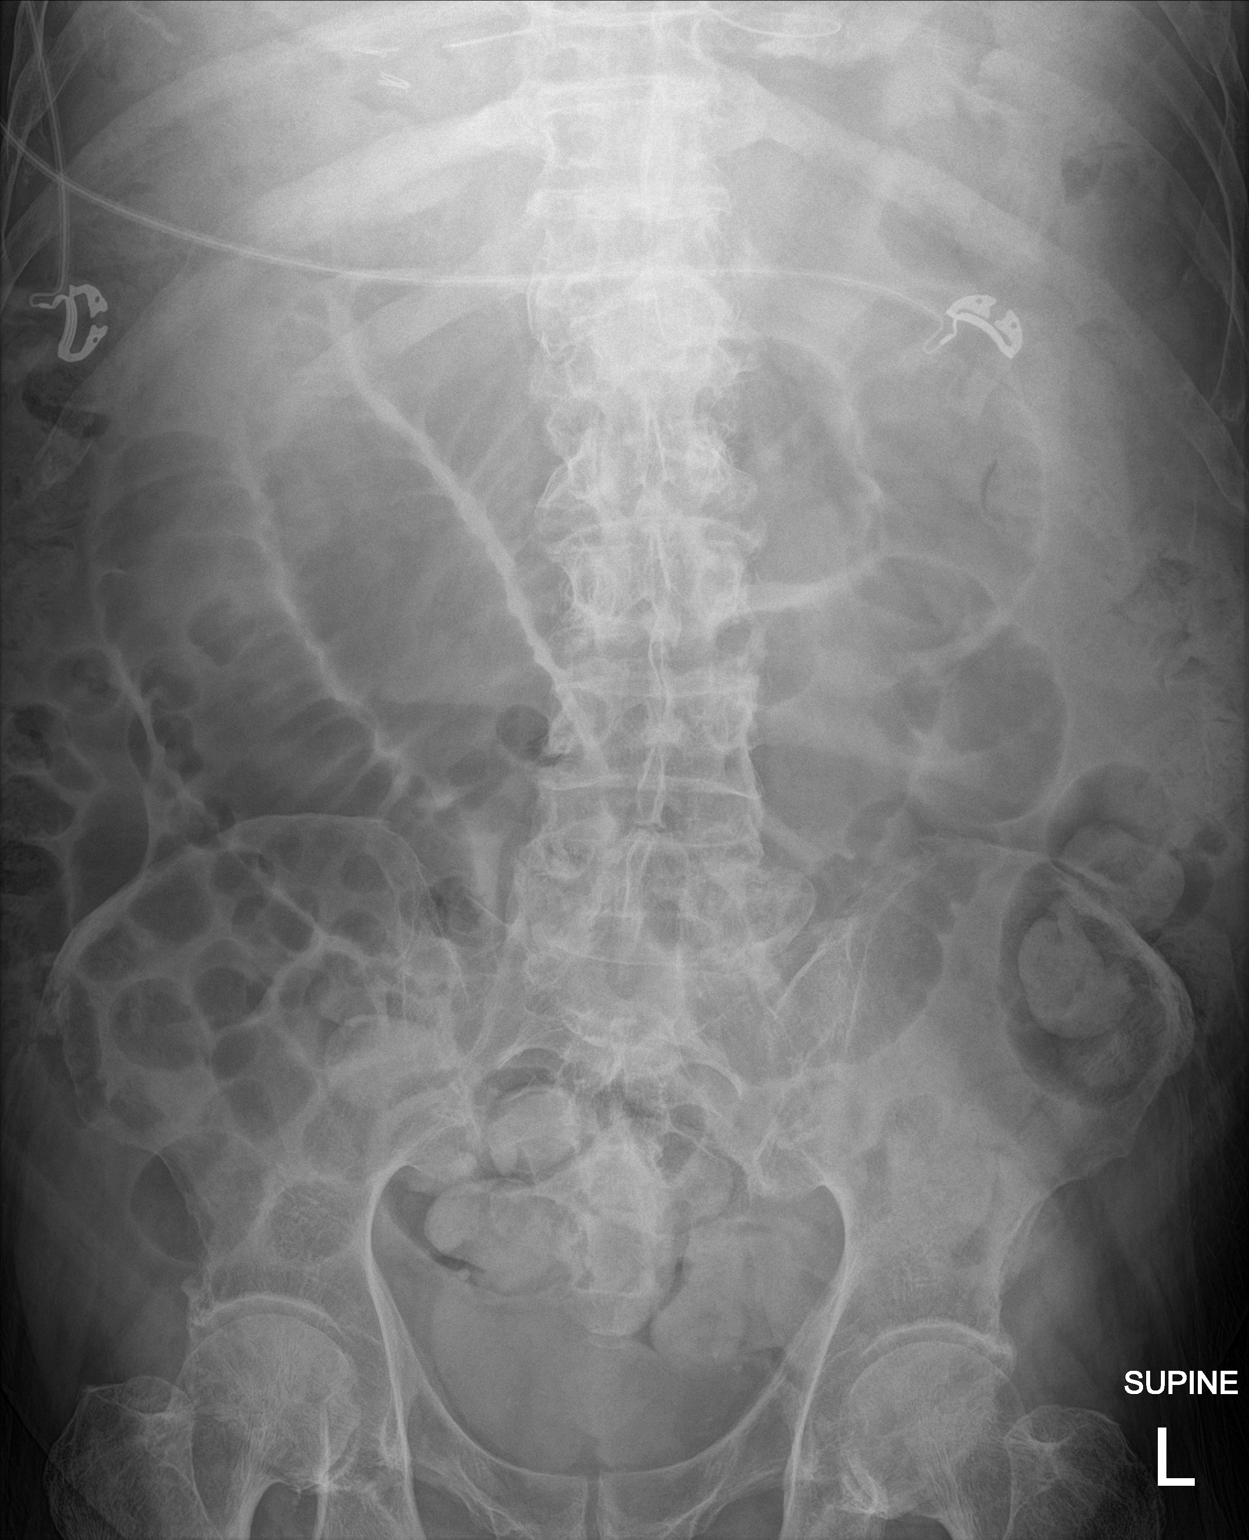

[abdomen kub (2 of 2)]
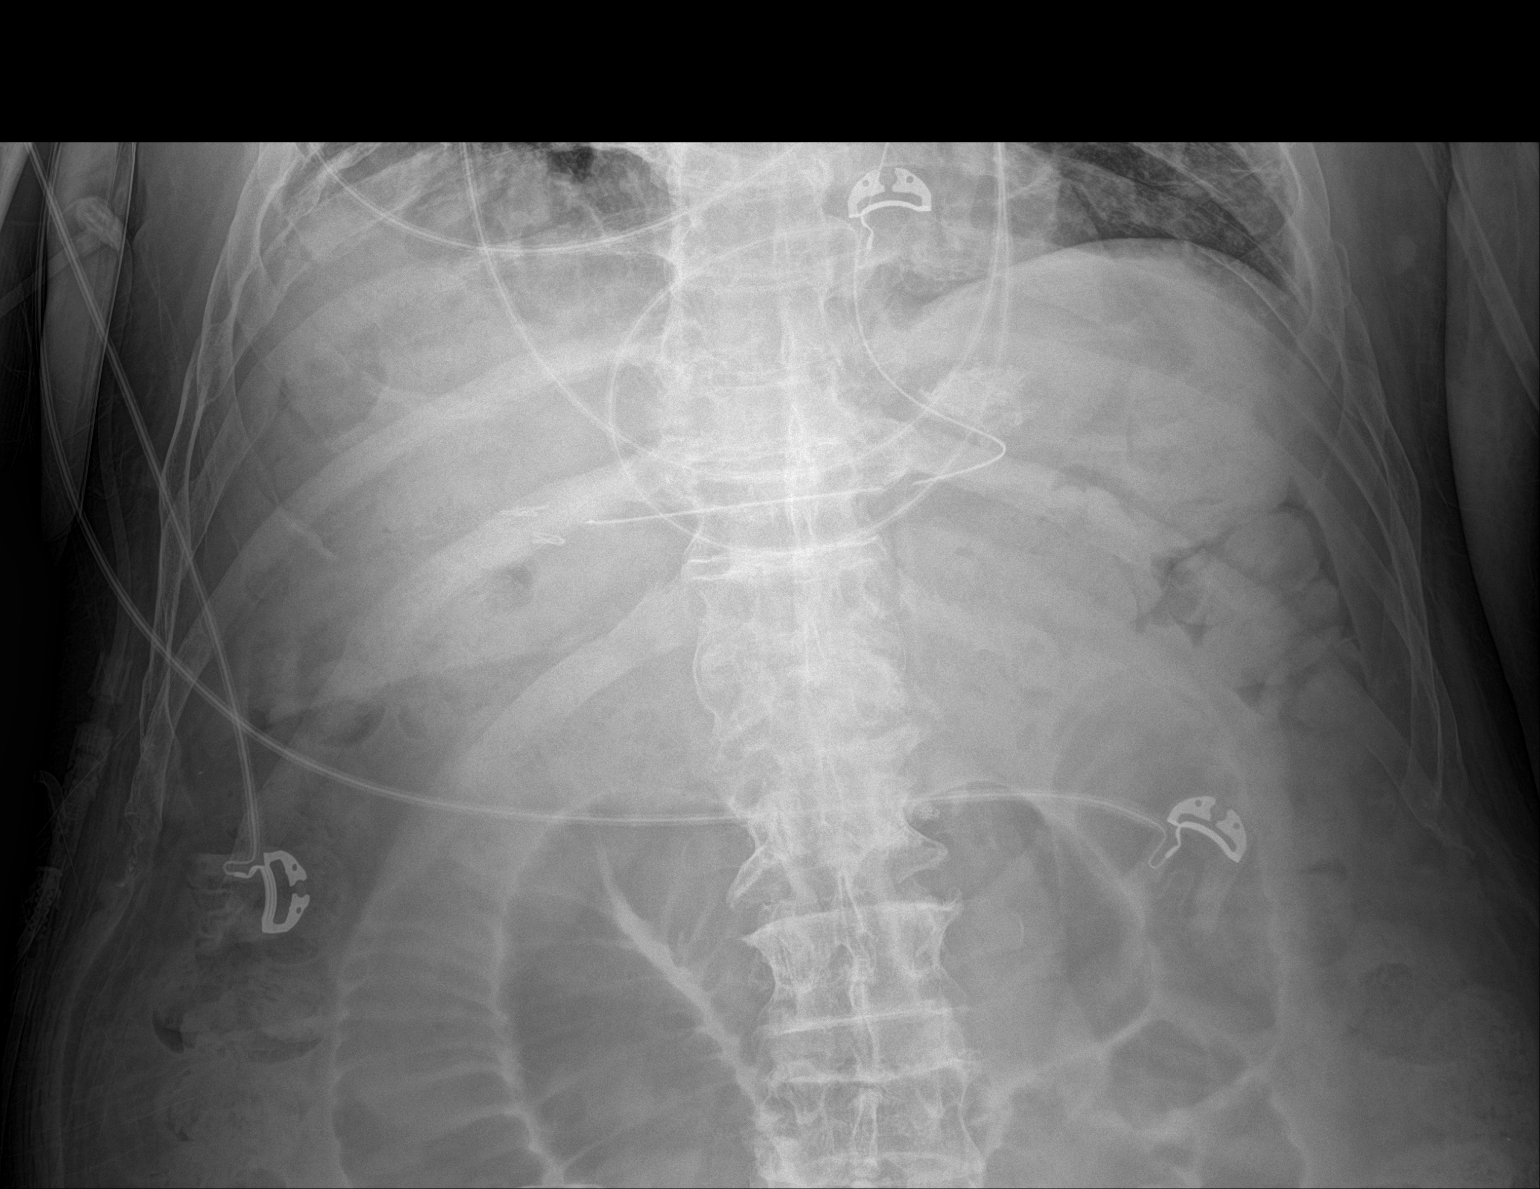

[2 of 2 positions shown; findings below may reference images not displayed]

FINDINGS: Nasogastric tube tip overlaps the distal stomach. Unchanged dilated
small bowel. Stool seen throughout much of the colon. No visible
pneumatosis. No concerning gas collection. Streaky lower lobe
opacities, stable from admission CT.
IMPRESSION: 1. Unchanged small bowel obstruction.
2. Formed stool seen throughout the colon.
3. Nasogastric tube in good position.

## 2019-05-07 IMAGING — DX DG CHEST 1V PORT
2 series · 2 of 2 positions shown · non-contrast
Comparison: 09/24/2017 and 12/11/2016 chest x-ray.

CLINICAL DATA: 67-year-old male with shortness breath. Subsequent
encounter.

EXAM:
PORTABLE CHEST 1 VIEW

[chest ap (1 of 2)]
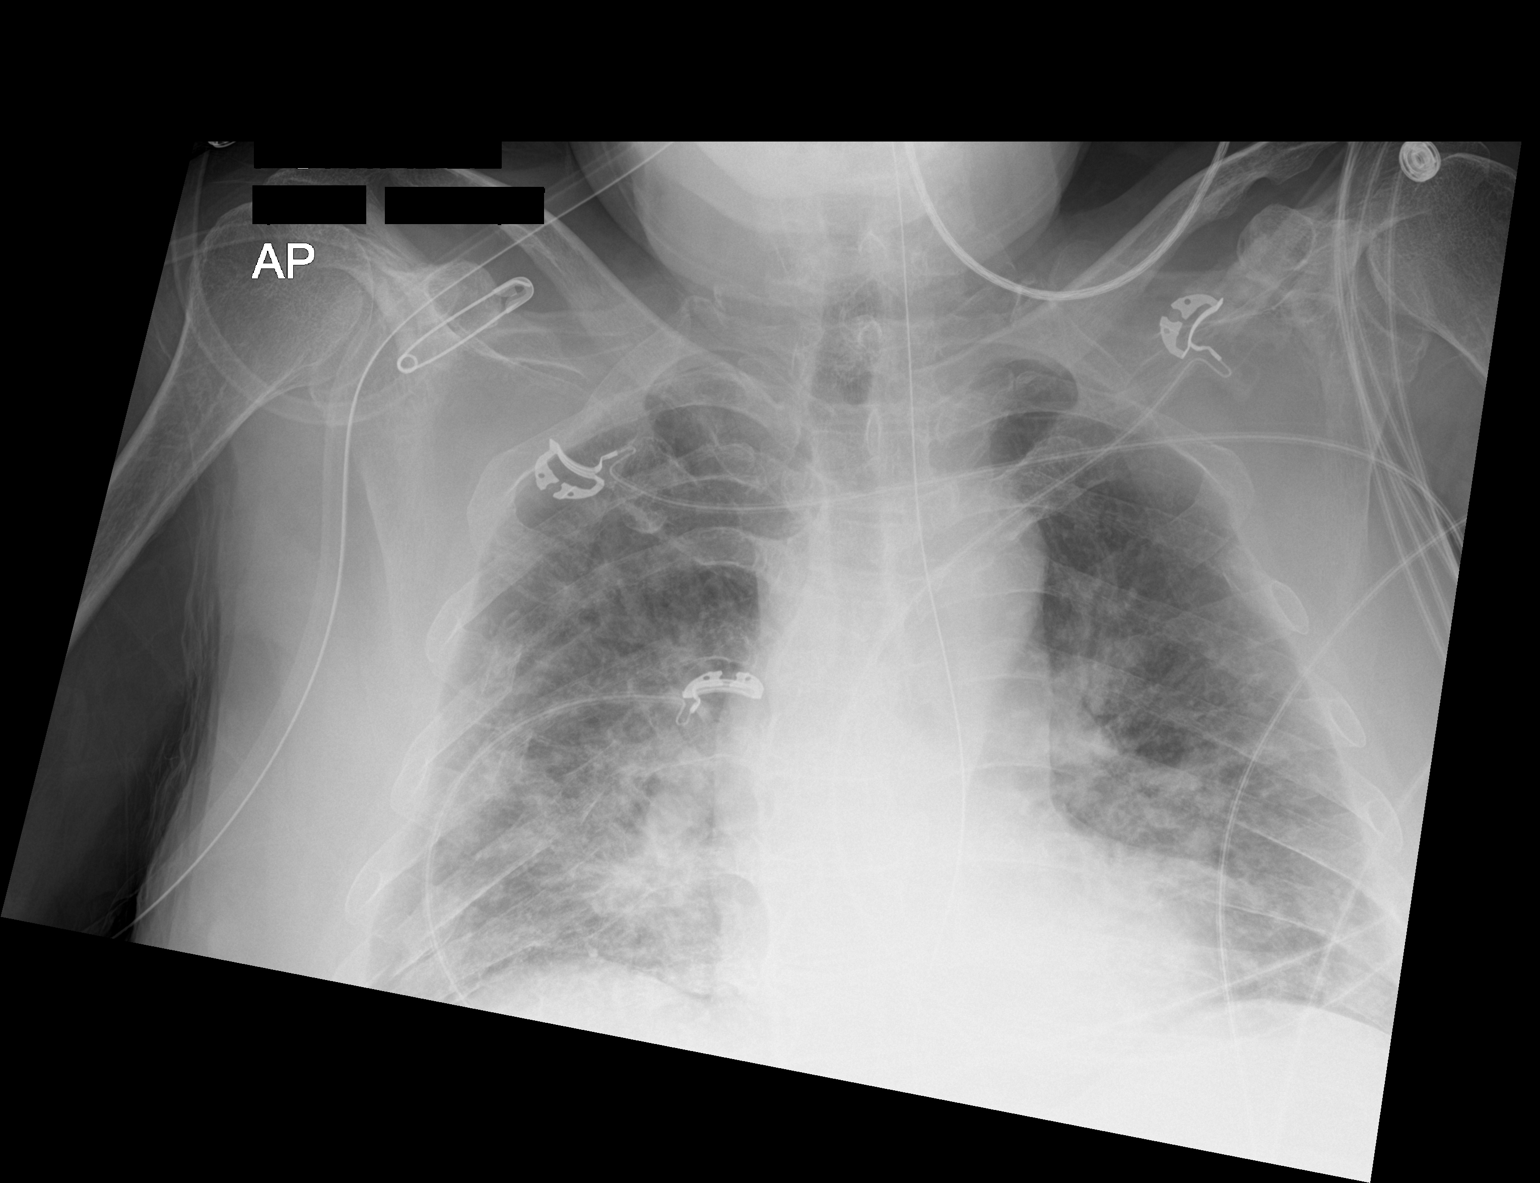

[chest ap (2 of 2)]
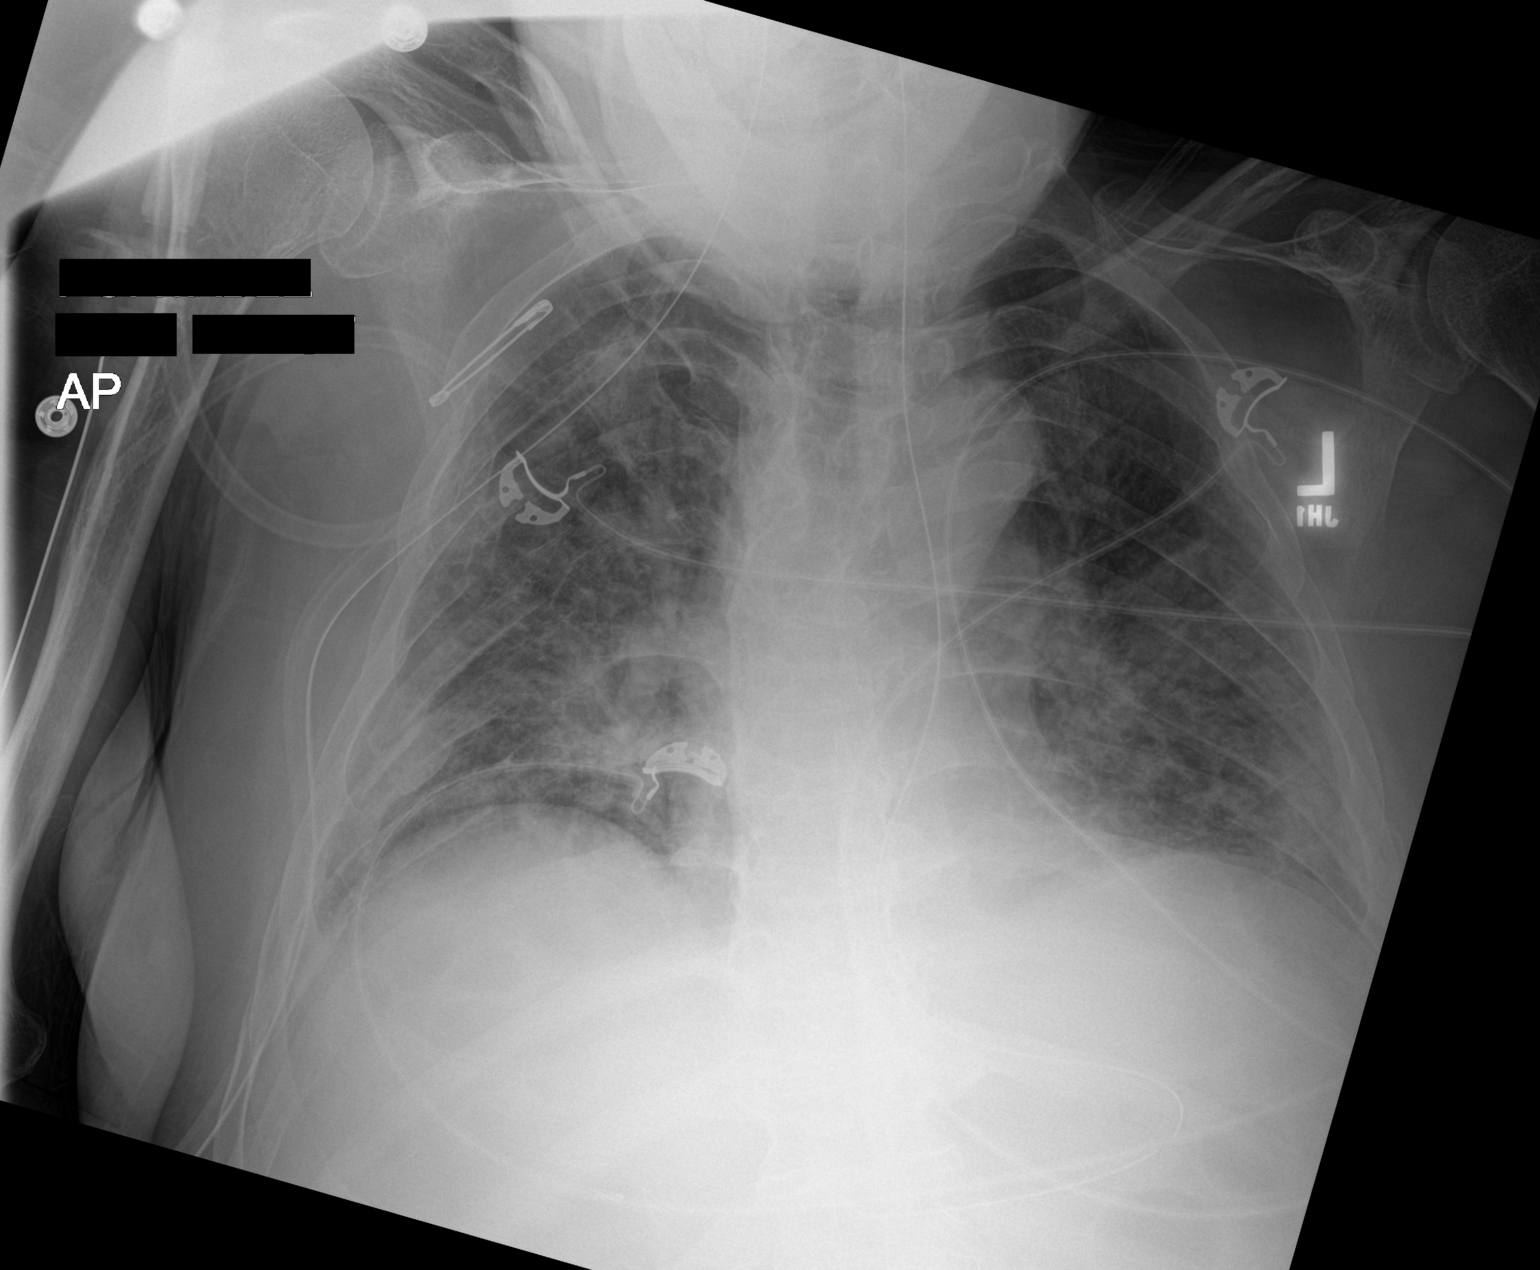

[2 of 2 positions shown; findings below may reference images not displayed]

FINDINGS: Persistent slightly asymmetric airspace disease may represent
pulmonary edema superimposed upon chronic lung changes. Infectious
infiltrate secondary less likely consideration.

No pneumothorax noted.

Heart size within normal limits.

Calcified tortuous aorta.

Nasogastric tube tip gastric body/antrum junction.
IMPRESSION: Similar appearance of slightly asymmetric airspace disease which may
represent pulmonary edema superimposed upon chronic lung changes.
Infectious infiltrate less likely consideration.

Aortic Atherosclerosis ([id number]).
